# Patient Record
Sex: Female | Born: 1970 | Race: Black or African American | Hispanic: No | Marital: Single | Smoking: Never smoker
Health system: Southern US, Community
[De-identification: ages and names within clinical notes are randomized; demographics above are authoritative.]

## PROBLEM LIST (undated history)

## (undated) DIAGNOSIS — K219 Gastro-esophageal reflux disease without esophagitis: Secondary | ICD-10-CM

## (undated) DIAGNOSIS — R011 Cardiac murmur, unspecified: Secondary | ICD-10-CM

## (undated) DIAGNOSIS — D649 Anemia, unspecified: Secondary | ICD-10-CM

## (undated) DIAGNOSIS — B20 Human immunodeficiency virus [HIV] disease: Secondary | ICD-10-CM

---

## 1997-09-22 ENCOUNTER — Emergency Department (HOSPITAL_COMMUNITY): Admission: EM | Admit: 1997-09-22 | Discharge: 1997-09-22 | Payer: Self-pay | Admitting: Emergency Medicine

## 1998-06-27 ENCOUNTER — Emergency Department (HOSPITAL_COMMUNITY): Admission: EM | Admit: 1998-06-27 | Discharge: 1998-06-27 | Payer: Self-pay | Admitting: Emergency Medicine

## 1998-07-11 ENCOUNTER — Emergency Department (HOSPITAL_COMMUNITY): Admission: EM | Admit: 1998-07-11 | Discharge: 1998-07-11 | Payer: Self-pay

## 1999-05-16 ENCOUNTER — Emergency Department (HOSPITAL_COMMUNITY): Admission: EM | Admit: 1999-05-16 | Discharge: 1999-05-16 | Payer: Self-pay | Admitting: Emergency Medicine

## 2006-05-21 ENCOUNTER — Ambulatory Visit: Payer: Self-pay | Admitting: Internal Medicine

## 2006-05-21 ENCOUNTER — Encounter: Admission: RE | Admit: 2006-05-21 | Discharge: 2006-05-21 | Payer: Self-pay | Admitting: Internal Medicine

## 2006-05-21 LAB — CONVERTED CEMR LAB
BUN: 6 mg/dL (ref 6–23)
Basophils Absolute: 0.1 10*3/uL (ref 0.0–0.1)
Basophils Relative: 3 % — ABNORMAL HIGH (ref 0–1)
Bilirubin Urine: NEGATIVE
Calcium: 9.4 mg/dL (ref 8.4–10.5)
Chlamydia, Swab/Urine, PCR: NEGATIVE
Eosinophils Relative: 2 % (ref 0–5)
GC Probe Amp, Urine: NEGATIVE
HIV: REACTIVE
Hemoglobin, Urine: NEGATIVE
Hepatitis B Surface Ag: NEGATIVE
Ketones, ur: NEGATIVE mg/dL
Lymphocytes Relative: 39 % (ref 12–46)
Lymphs Abs: 1.8 10*3/uL (ref 0.7–3.3)
MCHC: 30.1 g/dL (ref 30.0–36.0)
MCV: 61.7 fL — ABNORMAL LOW (ref 78.0–100.0)
Monocytes Absolute: 0.6 10*3/uL (ref 0.2–0.7)
Monocytes Relative: 13 % — ABNORMAL HIGH (ref 3–11)
Neutro Abs: 2 10*3/uL (ref 1.7–7.7)
Potassium: 3.8 meq/L (ref 3.5–5.3)
Protein, ur: NEGATIVE mg/dL
RBC / HPF: NONE SEEN (ref <3)
RBC: 4.96 M/uL (ref 3.87–5.11)
Sodium: 136 meq/L (ref 135–145)
Specific Gravity, Urine: 1.013 (ref 1.005–1.03)
Total Bilirubin: 1.1 mg/dL (ref 0.3–1.2)
Urine Glucose: NEGATIVE mg/dL
WBC: 4.7 10*3/uL (ref 4.0–10.5)

## 2006-06-04 DIAGNOSIS — D509 Iron deficiency anemia, unspecified: Secondary | ICD-10-CM | POA: Insufficient documentation

## 2006-06-04 DIAGNOSIS — D573 Sickle-cell trait: Secondary | ICD-10-CM | POA: Insufficient documentation

## 2006-06-04 DIAGNOSIS — B20 Human immunodeficiency virus [HIV] disease: Secondary | ICD-10-CM | POA: Insufficient documentation

## 2006-06-05 ENCOUNTER — Ambulatory Visit: Payer: Self-pay | Admitting: Internal Medicine

## 2006-06-10 ENCOUNTER — Encounter: Payer: Self-pay | Admitting: Internal Medicine

## 2006-06-12 ENCOUNTER — Telehealth (INDEPENDENT_AMBULATORY_CARE_PROVIDER_SITE_OTHER): Payer: Self-pay | Admitting: *Deleted

## 2006-11-17 ENCOUNTER — Telehealth: Payer: Self-pay | Admitting: Internal Medicine

## 2006-11-18 ENCOUNTER — Encounter: Payer: Self-pay | Admitting: Internal Medicine

## 2007-03-19 ENCOUNTER — Emergency Department (HOSPITAL_COMMUNITY): Admission: EM | Admit: 2007-03-19 | Discharge: 2007-03-19 | Payer: Self-pay | Admitting: Emergency Medicine

## 2007-06-01 ENCOUNTER — Emergency Department (HOSPITAL_COMMUNITY): Admission: EM | Admit: 2007-06-01 | Discharge: 2007-06-02 | Payer: Self-pay | Admitting: Emergency Medicine

## 2007-07-25 ENCOUNTER — Encounter: Payer: Self-pay | Admitting: Internal Medicine

## 2007-08-03 ENCOUNTER — Encounter: Payer: Self-pay | Admitting: Internal Medicine

## 2007-08-06 ENCOUNTER — Encounter: Payer: Self-pay | Admitting: Internal Medicine

## 2007-08-06 ENCOUNTER — Ambulatory Visit: Payer: Self-pay | Admitting: Internal Medicine

## 2007-08-10 ENCOUNTER — Encounter: Payer: Self-pay | Admitting: Internal Medicine

## 2007-08-12 ENCOUNTER — Encounter: Payer: Self-pay | Admitting: Internal Medicine

## 2007-08-13 ENCOUNTER — Ambulatory Visit: Payer: Self-pay | Admitting: Internal Medicine

## 2007-08-13 LAB — CONVERTED CEMR LAB
Absolute CD4: 507 #/uL (ref 381–1469)
CD4 T Helper %: 27 % — ABNORMAL LOW (ref 32–62)
Total lymphocyte count: 1876 cells/mcL (ref 700–3300)

## 2007-08-14 ENCOUNTER — Telehealth (INDEPENDENT_AMBULATORY_CARE_PROVIDER_SITE_OTHER): Payer: Self-pay | Admitting: *Deleted

## 2007-08-17 ENCOUNTER — Encounter: Payer: Self-pay | Admitting: Infectious Diseases

## 2007-08-17 LAB — CONVERTED CEMR LAB
Basophils Absolute: 0.1 10*3/uL (ref 0.0–0.1)
Basophils Relative: 2 % — ABNORMAL HIGH (ref 0–1)
CO2: 23 meq/L (ref 19–32)
Calcium: 8.7 mg/dL (ref 8.4–10.5)
Chloride: 108 meq/L (ref 96–112)
Eosinophils Relative: 1 % (ref 0–5)
HCT: 25.2 % — ABNORMAL LOW (ref 36.0–46.0)
Hemoglobin: 7 g/dL — CL (ref 12.0–15.0)
Lymphocytes Relative: 28 % (ref 12–46)
Lymphs Abs: 1.9 10*3/uL (ref 0.7–4.0)
MCHC: 27.8 g/dL — ABNORMAL LOW (ref 30.0–36.0)
Monocytes Absolute: 0.8 10*3/uL (ref 0.1–1.0)
Monocytes Relative: 11 % (ref 3–12)
Neutro Abs: 3.9 10*3/uL (ref 1.7–7.7)
Neutrophils Relative %: 57 % (ref 43–77)
Platelets: 420 10*3/uL — ABNORMAL HIGH (ref 150–400)
Potassium: 4.2 meq/L (ref 3.5–5.3)
Total Bilirubin: 0.8 mg/dL (ref 0.3–1.2)
Total Protein: 7.8 g/dL (ref 6.0–8.3)

## 2007-11-02 ENCOUNTER — Ambulatory Visit: Payer: Self-pay | Admitting: Internal Medicine

## 2007-11-02 DIAGNOSIS — K130 Diseases of lips: Secondary | ICD-10-CM | POA: Insufficient documentation

## 2007-12-01 ENCOUNTER — Telehealth: Payer: Self-pay | Admitting: Internal Medicine

## 2007-12-15 ENCOUNTER — Telehealth: Payer: Self-pay | Admitting: Internal Medicine

## 2008-01-28 ENCOUNTER — Ambulatory Visit: Payer: Self-pay | Admitting: Internal Medicine

## 2008-08-02 ENCOUNTER — Encounter: Payer: Self-pay | Admitting: Internal Medicine

## 2008-08-22 ENCOUNTER — Encounter (INDEPENDENT_AMBULATORY_CARE_PROVIDER_SITE_OTHER): Payer: Self-pay | Admitting: Internal Medicine

## 2008-08-22 ENCOUNTER — Ambulatory Visit: Payer: Self-pay | Admitting: Internal Medicine

## 2008-08-22 LAB — CONVERTED CEMR LAB: HIV-1 RNA Quant, Log: 2.48 — ABNORMAL HIGH (ref <1.68)

## 2008-08-23 ENCOUNTER — Ambulatory Visit: Payer: Self-pay | Admitting: Internal Medicine

## 2008-08-23 ENCOUNTER — Telehealth (INDEPENDENT_AMBULATORY_CARE_PROVIDER_SITE_OTHER): Payer: Self-pay | Admitting: *Deleted

## 2008-08-23 LAB — CONVERTED CEMR LAB
ALT: 8 units/L (ref 0–35)
AST: 11 units/L (ref 0–37)
Albumin: 4.1 g/dL (ref 3.5–5.2)
Alkaline Phosphatase: 34 units/L — ABNORMAL LOW (ref 39–117)
BUN: 7 mg/dL (ref 6–23)
Basophils Absolute: 0.1 10*3/uL (ref 0.0–0.1)
Basophils Absolute: 0.2 10*3/uL — ABNORMAL HIGH (ref 0.0–0.1)
Basophils Relative: 3 % — ABNORMAL HIGH (ref 0–1)
Calcium: 9 mg/dL (ref 8.4–10.5)
Eosinophils Absolute: 0.1 10*3/uL (ref 0.0–0.7)
Eosinophils Absolute: 0.1 10*3/uL (ref 0.0–0.7)
Eosinophils Relative: 2 % (ref 0–5)
Eosinophils Relative: 1 % (ref 0–5)
GFR calc Af Amer: >60 mL/min (ref >60)
HCT: 19 % — ABNORMAL LOW (ref 36.0–46.0)
Hemoglobin: 5.6 g/dL — CL (ref 12.0–15.0)
Lymphocytes Relative: 32 % (ref 12–46)
Lymphs Abs: 2 10*3/uL (ref 0.7–4.0)
MCHC: 26.1 g/dL — ABNORMAL LOW (ref 30.0–36.0)
MCHC: 29.6 g/dL — ABNORMAL LOW (ref 30.0–36.0)
MCV: 53.9 fL — ABNORMAL LOW (ref 78.0–100.0)
MCV: 52.9 fL — ABNORMAL LOW (ref 78.0–100.0)
Monocytes Absolute: 0.7 10*3/uL (ref 0.1–1.0)
Monocytes Relative: 11 % (ref 3–12)
Neutro Abs: 2.8 10*3/uL (ref 1.7–7.7)
Neutro Abs: 3.3 10*3/uL (ref 1.7–7.7)
Neutrophils Relative %: 47 % (ref 43–77)
Neutrophils Relative %: 53 % (ref 43–77)
Platelets: 439 10*3/uL — ABNORMAL HIGH (ref 150–400)
Platelets: 337 10*3/uL (ref 150–400)
RBC: 3.69 M/uL — ABNORMAL LOW (ref 3.87–5.11)
RBC: 3.59 M/uL — ABNORMAL LOW (ref 3.87–5.11)
RDW: 21.9 % — ABNORMAL HIGH (ref 11.5–15.5)
RDW: 23.6 % — ABNORMAL HIGH (ref 11.5–15.5)
WBC: 6.2 10*3/uL (ref 4.0–10.5)

## 2008-08-24 ENCOUNTER — Ambulatory Visit (HOSPITAL_COMMUNITY): Admission: RE | Admit: 2008-08-24 | Discharge: 2008-08-24 | Payer: Self-pay | Admitting: Internal Medicine

## 2008-09-05 ENCOUNTER — Encounter: Payer: Self-pay | Admitting: Internal Medicine

## 2008-09-05 ENCOUNTER — Ambulatory Visit: Payer: Self-pay | Admitting: Internal Medicine

## 2008-09-05 DIAGNOSIS — R109 Unspecified abdominal pain: Secondary | ICD-10-CM | POA: Insufficient documentation

## 2008-09-05 LAB — CONVERTED CEMR LAB
Hemoglobin: 8.8 g/dL — ABNORMAL LOW (ref 12.0–15.0)
MCV: 64.5 fL — ABNORMAL LOW (ref 78.0–100.0)
Platelets: 783 10*3/uL — ABNORMAL HIGH (ref 150–400)
RBC: 4.73 M/uL (ref 3.87–5.11)
RDW: 32.1 % — ABNORMAL HIGH (ref 11.5–15.5)
WBC: 7.7 10*3/uL (ref 4.0–10.5)

## 2008-09-22 ENCOUNTER — Encounter: Payer: Self-pay | Admitting: Internal Medicine

## 2009-01-01 ENCOUNTER — Telehealth: Payer: Self-pay | Admitting: Internal Medicine

## 2009-06-30 ENCOUNTER — Emergency Department (HOSPITAL_COMMUNITY): Admission: EM | Admit: 2009-06-30 | Discharge: 2009-06-30 | Payer: Self-pay | Admitting: Emergency Medicine

## 2009-08-21 ENCOUNTER — Encounter: Payer: Self-pay | Admitting: Internal Medicine

## 2009-09-24 ENCOUNTER — Encounter: Payer: Self-pay | Admitting: Internal Medicine

## 2009-09-24 ENCOUNTER — Ambulatory Visit: Payer: Self-pay | Admitting: Internal Medicine

## 2009-09-24 LAB — CONVERTED CEMR LAB: HIV-1 RNA Quant, Log: 2.69 — ABNORMAL HIGH (ref <1.68)

## 2009-09-25 ENCOUNTER — Telehealth (INDEPENDENT_AMBULATORY_CARE_PROVIDER_SITE_OTHER): Payer: Self-pay | Admitting: *Deleted

## 2009-09-25 ENCOUNTER — Telehealth: Payer: Self-pay | Admitting: Internal Medicine

## 2009-09-25 LAB — CONVERTED CEMR LAB
ALT: 10 units/L (ref 0–35)
Alkaline Phosphatase: 34 units/L — ABNORMAL LOW (ref 39–117)
BUN: 5 mg/dL — ABNORMAL LOW (ref 6–23)
Basophils Absolute: 0.1 10*3/uL (ref 0.0–0.1)
Eosinophils Relative: 2 % (ref 0–5)
Hemoglobin: 5.3 g/dL — CL (ref 12.0–15.0)
Lymphs Abs: 1.6 10*3/uL (ref 0.7–4.0)
Neutrophils Relative %: 43 % (ref 43–77)
Potassium: 4.4 meq/L (ref 3.5–5.3)
RBC: 3.74 M/uL — ABNORMAL LOW (ref 3.87–5.11)
Sodium: 136 meq/L (ref 135–145)
Total Bilirubin: 0.8 mg/dL (ref 0.3–1.2)
WBC: 4.1 10*3/uL (ref 4.0–10.5)

## 2009-09-26 ENCOUNTER — Ambulatory Visit (HOSPITAL_COMMUNITY): Admission: RE | Admit: 2009-09-26 | Discharge: 2009-09-26 | Payer: Self-pay | Admitting: Internal Medicine

## 2009-10-08 ENCOUNTER — Ambulatory Visit: Payer: Self-pay | Admitting: Internal Medicine

## 2009-10-08 LAB — CONVERTED CEMR LAB
HCT: 33.5 % — ABNORMAL LOW (ref 36.0–46.0)
MCHC: 29.6 g/dL — ABNORMAL LOW (ref 30.0–36.0)
Platelets: 573 10*3/uL — ABNORMAL HIGH (ref 150–400)

## 2010-05-09 ENCOUNTER — Encounter: Payer: Self-pay | Admitting: *Deleted

## 2010-05-16 NOTE — Miscellaneous (Signed)
Summary: HIPAA Restrictions  HIPAA Restrictions   Imported By: Florinda Marker 09/24/2009 10:39:02  _____________________________________________________________________  External Attachment:    Type:   Image     Comment:   External Document

## 2010-05-16 NOTE — Letter (Signed)
Summary: Generic Letter  [facility]  [address]   [place], [place] [address]   Phone: [phone]  Fax: [phone]    05/09/2010  STEPHIE XU [address] [place], [place]  [address]  Dear Ms. Hild,   I have been unable to reach you by phone. Please call us when you get this letter & let us know your current number. We are trying to schedule your PAP smear. Call the front office at [phone]. Thanks.        Sincerely,   Golden Circle RN

## 2010-05-16 NOTE — Assessment & Plan Note (Signed)
Summary: CHECK UP [MKJ]   CC:  follow-up visit, lab results, and pt. c/o night sweats x 1 month and poor appetite.  History of Present Illness: Pt feeling well. No SOB or fatigue.  She does c/o heavy periods.  Pt c/o night sweats.  No fever, cough or swollen nodes.  Preventive Screening-Counseling & Management  Alcohol-Tobacco     Alcohol drinks/day: 0     Alcohol type: all     >5/day in last 3 mos: yes     Smoking Status: never  Caffeine-Diet-Exercise     Caffeine use/day: coffee, tea, sodas 6 per day     Does Patient Exercise: yes     Type of exercise: walking     Exercise (avg: min/session): 30-60     Times/week: 5  Safety-Violence-Falls     Seat Belt Use: yes      Sexual History:  n/a.        Drug Use:  never.    Comments: pt. declined condoms   Updated Prior Medication List: No Medications Current Allergies (reviewed today): No known allergies  Past History:  Past Medical History: Last updated: 06/04/2006 HIV disease Anemia-iron deficiency Sickle cell trait  Social History: Sexual History:  n/a Drug Use:  never  Review of Systems  The patient denies anorexia, fever, weight loss, syncope, dyspnea on exertion, melena, and hematochezia.    Vital Signs:  Patient profile:   40 year old female Menstrual status:  regular Height:      61 inches (154.94 cm) Weight:      90.4 pounds (41.09 kg) BMI:     17.14 Temp:     97.5 degrees F (36.39 degrees C) oral Pulse rate:   73 / minute BP sitting:   138 / 87  (right arm)  Vitals Entered By: Wendall Mola CMA Duncan Dull) (October 08, 2009 9:31 AM) CC: follow-up visit, lab results, pt. c/o night sweats x 1 month and poor appetite Is Patient Diabetic? No Pain Assessment Patient in pain? no      Nutritional Status BMI of < 19 = underweight Nutritional Status Detail appetite "not good"  Have you ever been in a relationship where you felt threatened, hurt or afraid?No   Does patient need  assistance? Functional Status Self care Ambulation Normal   Physical Exam  General:  alert, well-developed, well-nourished, and well-hydrated.   Head:  normocephalic and atraumatic.   Mouth:  pharynx pink and moist.   Neck:  no cervical lymphadenopathy.   Lungs:  normal breath sounds.   Heart:  normal rate and regular rhythm.      Impression & Recommendations:  Problem # 1:  HIV DISEASE (ICD-042) Pt currently asymptomatic and not on treatment. will repeat labs in 3 months.  Schedule for PAP. Diagnostics Reviewed:  HIV: REACTIVE (05/21/2006)   HIV-Western blot: POSITIVE (05/21/2006)   CD4: 590 (09/25/2009)   WBC: 4.1 (09/24/2009)   Hgb: 5.3 (09/24/2009)   HCT: 20.2 (09/24/2009)   Platelets: 364 (09/24/2009) HIV-1 RNA: 492 (09/24/2009)   HBSAg: NEG (05/21/2006)  Problem # 2:  ANEMIA-IRON DEFICIENCY (ICD-280.9) Pt to take iron. will repeat CBC s/p transfusion. Orders: T-CBC No Diff ([id number])  Other Orders: Est. Patient Level III ([address]) Future Orders: T-CD4SP ([facility]) (CD4SP) ... 01/06/2010 T-HIV Viral Load [phone]) ... 01/06/2010 T-Comprehensive Metabolic Panel [phone]) ... 01/06/2010 T-CBC w/Diff ([id number]) ... 01/06/2010  Patient Instructions: 1)  Please schedule a follow-up appointment in 3 months, 2 weeks after labs. 2)  Schedule for PAP in  PAP clinic

## 2010-05-16 NOTE — Progress Notes (Signed)
Summary: phone note  Phone Note Call from Patient Call back at [phone]   Caller: Patient Call For: Yisroel Ramming MD Summary of Call: Patient called wanted to know if an appointment was made for her to get blood transfusions? Call back number is [phone] Initial call taken by: Starleen Arms CMA,  September 25, 2009 11:27 AM  Follow-up for Phone Call        please transfuse with 3 units of PRBC will need a f/u CBC after transfusion Follow-up by: Yisroel Ramming MD,  September 25, 2009 2:45 PM  Additional Follow-up for Phone Call Additional follow up Details #1::        appt. made with short stay for transfusion for 09/26/09 at 8:00 AM, pt. aware Additional Follow-up by: Wendall Mola CMA Duncan Dull),  September 25, 2009 3:57 PM

## 2010-05-16 NOTE — Miscellaneous (Signed)
Summary: Orders Update - Bridge Counseling appt.  Clinical Lists Changes  Orders: Added new Referral order of Misc. Referral (Misc. Ref) - Signed

## 2010-05-16 NOTE — Progress Notes (Signed)
  Phone Note Other Incoming   Summary of Call: Received a call from [facility] at 12:30 AM 09/25/2009 for a critical lab value. Patients Hb is 5.3 Looking back at her previous Hb's, it seems like she has been running at this range and received transfusions last month. Called patient at [phone] and recommended to go the the ED immeditely. Patient reported that she has been feeling fine and doesn't have any symptoms. She wants to talk to Dr. Philipp Deputy and get arrangements at short stay just like the way she did last month. I informed her that the whole process may take half to one day and recommended to go to ED. Patient still wants to talk to Dr. Danella Deis office and get the transfusions done at short stay. Will pass on this information to Dr. Philipp Deputy. Initial call taken by: Blondell Reveal MD,  September 25, 2009 12:59 AM  Follow-up for Phone Call       Follow-up by: Blondell Reveal MD,  September 25, 2009 1:03 AM

## 2010-06-30 LAB — TYPE AND SCREEN: Antibody Screen: NEGATIVE

## 2010-07-01 LAB — T-HELPER CELL (CD4) - (RCID CLINIC ONLY): CD4 T Cell Abs: 590 uL (ref 400–2700)

## 2010-07-23 LAB — CROSSMATCH: Antibody Screen: NEGATIVE

## 2011-03-04 ENCOUNTER — Telehealth: Payer: Self-pay | Admitting: *Deleted

## 2011-03-04 ENCOUNTER — Encounter: Payer: Self-pay | Admitting: *Deleted

## 2011-03-04 NOTE — Telephone Encounter (Signed)
Her phone had been d/c. I sent her a letter asking her to cal;l. Needs pap, lab & md appt

## 2011-03-18 ENCOUNTER — Telehealth: Payer: Self-pay | Admitting: *Deleted

## 2011-03-18 NOTE — Telephone Encounter (Signed)
Letter I sent her was returned. She no longer has the same address. Given to bridge counsellor [employer]

## 2011-03-24 ENCOUNTER — Encounter: Payer: Self-pay | Admitting: *Deleted

## 2011-05-22 ENCOUNTER — Telehealth: Payer: Self-pay | Admitting: *Deleted

## 2011-05-22 NOTE — Telephone Encounter (Signed)
Pt out-of-care.  Please attempt to find.

## 2011-05-30 ENCOUNTER — Telehealth: Payer: Self-pay | Admitting: *Deleted

## 2011-05-30 NOTE — Telephone Encounter (Signed)
Transferred to [facility] per Sharol Roussel, Bridge Counselor.

## 2014-07-12 ENCOUNTER — Telehealth: Payer: Self-pay

## 2014-07-12 NOTE — Telephone Encounter (Signed)
Patient contacted regarding new intake appointment. Date and time given. Information given regarding documents needed to qualify for financial eligibility.  Tammy K King, RN  

## 2014-07-27 ENCOUNTER — Ambulatory Visit: Payer: Self-pay

## 2014-09-05 ENCOUNTER — Other Ambulatory Visit (HOSPITAL_COMMUNITY): Admission: RE | Admit: 2014-09-05 | Payer: Self-pay | Source: Ambulatory Visit | Admitting: Internal Medicine

## 2014-09-05 ENCOUNTER — Ambulatory Visit (INDEPENDENT_AMBULATORY_CARE_PROVIDER_SITE_OTHER): Payer: Self-pay

## 2014-09-05 ENCOUNTER — Telehealth: Payer: Self-pay | Admitting: *Deleted

## 2014-09-05 DIAGNOSIS — Z23 Encounter for immunization: Secondary | ICD-10-CM

## 2014-09-05 DIAGNOSIS — B20 Human immunodeficiency virus [HIV] disease: Secondary | ICD-10-CM

## 2014-09-05 DIAGNOSIS — Z8742 Personal history of other diseases of the female genital tract: Secondary | ICD-10-CM

## 2014-09-05 DIAGNOSIS — D500 Iron deficiency anemia secondary to blood loss (chronic): Secondary | ICD-10-CM

## 2014-09-05 DIAGNOSIS — D509 Iron deficiency anemia, unspecified: Secondary | ICD-10-CM | POA: Insufficient documentation

## 2014-09-05 LAB — URINALYSIS
Bilirubin Urine: NEGATIVE
Glucose, UA: NEGATIVE mg/dL
Hgb urine dipstick: NEGATIVE
KETONES UR: NEGATIVE mg/dL
NITRITE: NEGATIVE
PH: 7 (ref 5.0–8.0)
Protein, ur: NEGATIVE mg/dL
Specific Gravity, Urine: <1.005 — ABNORMAL LOW (ref 1.005–1.030)
Urobilinogen, UA: 1 mg/dL (ref 0.0–1.0)

## 2014-09-05 LAB — CBC WITH DIFFERENTIAL/PLATELET
BASOS ABS: 0.1 10*3/uL (ref 0.0–0.1)
BASOS PCT: 2 % — AB (ref 0–1)
EOS ABS: 0.2 10*3/uL (ref 0.0–0.7)
Eosinophils Relative: 4 % (ref 0–5)
HCT: 23.9 % — ABNORMAL LOW (ref 36.0–46.0)
Hemoglobin: 6.6 g/dL — CL (ref 12.0–15.0)
Lymphocytes Relative: 20 % (ref 12–46)
Lymphs Abs: 0.9 10*3/uL (ref 0.7–4.0)
MCH: 15.8 pg — ABNORMAL LOW (ref 26.0–34.0)
MCHC: 27.2 g/dL — AB (ref 30.0–36.0)
MCV: 57 fL — ABNORMAL LOW (ref 78.0–100.0)
MONOS PCT: 11 % (ref 3–12)
MPV: 9 fL (ref 8.6–12.4)
Monocytes Absolute: 0.5 10*3/uL (ref 0.1–1.0)
NEUTROS ABS: 2.7 10*3/uL (ref 1.7–7.7)
Neutrophils Relative %: 63 % (ref 43–77)
Platelets: 645 10*3/uL — ABNORMAL HIGH (ref 150–400)
RBC: 4.19 MIL/uL (ref 3.87–5.11)
RDW: 20.7 % — AB (ref 11.5–15.5)
WBC: 4.3 10*3/uL (ref 4.0–10.5)

## 2014-09-05 LAB — HEPATITIS B SURFACE ANTIGEN: Hepatitis B Surface Ag: NEGATIVE

## 2014-09-05 LAB — COMPLETE METABOLIC PANEL WITH GFR
ALBUMIN: 3.9 g/dL (ref 3.5–5.2)
ALT: 9 U/L (ref 0–35)
AST: 11 U/L (ref 0–37)
Alkaline Phosphatase: 57 U/L (ref 39–117)
BUN: 7 mg/dL (ref 6–23)
CALCIUM: 8.8 mg/dL (ref 8.4–10.5)
CO2: 23 meq/L (ref 19–32)
Chloride: 104 mEq/L (ref 96–112)
Creat: 0.46 mg/dL — ABNORMAL LOW (ref 0.50–1.10)
GFR, Est African American: >89 mL/min
GLUCOSE: 85 mg/dL (ref 70–99)
Potassium: 4.2 mEq/L (ref 3.5–5.3)
Sodium: 137 mEq/L (ref 135–145)
TOTAL PROTEIN: 7.6 g/dL (ref 6.0–8.3)
Total Bilirubin: 0.5 mg/dL (ref 0.2–1.2)

## 2014-09-05 LAB — LIPID PANEL
Cholesterol: 151 mg/dL (ref 0–200)
HDL: 58 mg/dL (ref >=46)
LDL CALC: 81 mg/dL (ref 0–99)
TRIGLYCERIDES: 60 mg/dL (ref <150)
Total CHOL/HDL Ratio: 2.6 Ratio
VLDL: 12 mg/dL (ref 0–40)

## 2014-09-05 LAB — HEPATITIS B SURFACE ANTIBODY,QUALITATIVE: HEP B S AB: NEGATIVE

## 2014-09-05 LAB — HEPATITIS C ANTIBODY: HCV AB: NEGATIVE

## 2014-09-05 LAB — HEPATITIS B CORE ANTIBODY, TOTAL: HEP B C TOTAL AB: NONREACTIVE

## 2014-09-05 LAB — RPR

## 2014-09-05 LAB — HEPATITIS A ANTIBODY, TOTAL: Hep A Total Ab: REACTIVE — AB

## 2014-09-05 NOTE — Telephone Encounter (Signed)
Pt needing appt for PAP smear.

## 2014-09-05 NOTE — Progress Notes (Signed)
Patient  is transferring from [facility] and was a prior patient with [employer] in  2008.  She has been without medications since October 2015.  She has problems swallowing large pills and says she would have no problems with a small pill regimen.  Her last regimen was Viread, [place].  She is a [facility]  client and has been referred to Adherence counselor.   No tattoos. Partial medical records received from [facility]. Pneumonia vaccine given.   Laverle Patter, RN

## 2014-09-06 ENCOUNTER — Emergency Department (HOSPITAL_COMMUNITY)
Admission: EM | Admit: 2014-09-06 | Discharge: 2014-09-06 | Disposition: A | Discharge status: Home / Self Care | Payer: Medicaid Other | Attending: Emergency Medicine | Admitting: Emergency Medicine

## 2014-09-06 ENCOUNTER — Telehealth: Payer: Self-pay

## 2014-09-06 ENCOUNTER — Encounter (HOSPITAL_COMMUNITY): Payer: Self-pay | Admitting: *Deleted

## 2014-09-06 DIAGNOSIS — Z21 Asymptomatic human immunodeficiency virus [HIV] infection status: Secondary | ICD-10-CM | POA: Diagnosis not present

## 2014-09-06 DIAGNOSIS — D500 Iron deficiency anemia secondary to blood loss (chronic): Secondary | ICD-10-CM | POA: Insufficient documentation

## 2014-09-06 DIAGNOSIS — Z79899 Other long term (current) drug therapy: Secondary | ICD-10-CM | POA: Diagnosis not present

## 2014-09-06 DIAGNOSIS — R7989 Other specified abnormal findings of blood chemistry: Secondary | ICD-10-CM | POA: Diagnosis present

## 2014-09-06 HISTORY — DX: Anemia, unspecified: D64.9

## 2014-09-06 HISTORY — DX: Human immunodeficiency virus (HIV) disease: B20

## 2014-09-06 LAB — IRON AND TIBC
Iron: 12 ug/dL — ABNORMAL LOW (ref 28–170)
SATURATION RATIOS: 3 % — AB (ref 10.4–31.8)
TIBC: 438 ug/dL (ref 250–450)
UIBC: 426 ug/dL

## 2014-09-06 LAB — COMPREHENSIVE METABOLIC PANEL
ALT: 10 U/L — AB (ref 14–54)
AST: 14 U/L — ABNORMAL LOW (ref 15–41)
Albumin: 3.4 g/dL — ABNORMAL LOW (ref 3.5–5.0)
Alkaline Phosphatase: 57 U/L (ref 38–126)
Anion gap: 8 (ref 5–15)
BILIRUBIN TOTAL: 0.6 mg/dL (ref 0.3–1.2)
CALCIUM: 8.6 mg/dL — AB (ref 8.9–10.3)
CO2: 24 mmol/L (ref 22–32)
Chloride: 105 mmol/L (ref 101–111)
Creatinine, Ser: 0.5 mg/dL (ref 0.44–1.00)
Glucose, Bld: 83 mg/dL (ref 65–99)
POTASSIUM: 3.9 mmol/L (ref 3.5–5.1)
SODIUM: 137 mmol/L (ref 135–145)
Total Protein: 7.5 g/dL (ref 6.5–8.1)

## 2014-09-06 LAB — RETICULOCYTES
RBC.: 3.85 MIL/uL — ABNORMAL LOW (ref 3.87–5.11)
RETIC CT PCT: 0.9 % (ref 0.4–3.1)
Retic Count, Absolute: 34.7 10*3/uL (ref 19.0–186.0)

## 2014-09-06 LAB — CBC WITH DIFFERENTIAL/PLATELET
BASOS PCT: 2 % — AB (ref 0–1)
Basophils Absolute: 0.1 10*3/uL (ref 0.0–0.1)
EOS ABS: 0.2 10*3/uL (ref 0.0–0.7)
EOS PCT: 3 % (ref 0–5)
HEMATOCRIT: 22.5 % — AB (ref 36.0–46.0)
Hemoglobin: 6.4 g/dL — CL (ref 12.0–15.0)
LYMPHS ABS: 1.1 10*3/uL (ref 0.7–4.0)
Lymphocytes Relative: 18 % (ref 12–46)
MCH: 16.1 pg — ABNORMAL LOW (ref 26.0–34.0)
MCHC: 28.4 g/dL — ABNORMAL LOW (ref 30.0–36.0)
MCV: 56.5 fL — AB (ref 78.0–100.0)
MONOS PCT: 14 % — AB (ref 3–12)
Monocytes Absolute: 0.8 10*3/uL (ref 0.1–1.0)
NEUTROS PCT: 63 % (ref 43–77)
Neutro Abs: 3.7 10*3/uL (ref 1.7–7.7)
PLATELETS: 547 10*3/uL — AB (ref 150–400)
RBC: 3.98 MIL/uL (ref 3.87–5.11)
RDW: 19.5 % — AB (ref 11.5–15.5)
WBC: 5.9 10*3/uL (ref 4.0–10.5)

## 2014-09-06 LAB — T-HELPER CELL (CD4) - (RCID CLINIC ONLY)
CD4 % Helper T Cell: 3 % — ABNORMAL LOW (ref 33–55)
CD4 T Cell Abs: 30 /uL — ABNORMAL LOW (ref 400–2700)

## 2014-09-06 LAB — VITAMIN B12: VITAMIN B 12: 272 pg/mL (ref 180–914)

## 2014-09-06 LAB — FOLATE: FOLATE: 15.2 ng/mL (ref >5.9)

## 2014-09-06 LAB — URINE CYTOLOGY ANCILLARY ONLY
CHLAMYDIA, DNA PROBE: NEGATIVE
Neisseria Gonorrhea: NEGATIVE

## 2014-09-06 LAB — FERRITIN: Ferritin: 2 ng/mL — ABNORMAL LOW (ref 11–307)

## 2014-09-06 LAB — PREPARE RBC (CROSSMATCH)

## 2014-09-06 MED ORDER — SODIUM CHLORIDE 0.9 % IV BOLUS (SEPSIS)
1000.0000 mL | Freq: Once | INTRAVENOUS | Status: AC
Start: 2014-09-06 — End: 2014-09-06
  Administered 2014-09-06: 1000 mL via INTRAVENOUS

## 2014-09-06 NOTE — ED Notes (Signed)
Patient denies any symptoms or reaction symptoms.

## 2014-09-06 NOTE — ED Provider Notes (Signed)
CSN: [id number]     Arrival date & time 09/06/14  1146 History   First MD Initiated Contact with Patient 09/06/14 1258     Chief Complaint  Patient presents with  . Abnormal Lab     (Consider location/radiation/quality/duration/timing/severity/associated sxs/prior Treatment) HPI Comments: Patient is a 44 year old with a past medical history of HIV and iron deficiency anemia who presents with low hemoglobin. Patient reports going to have labs done at [place] yesterday to establish care and they called her today and told her to come to the ED due to low hemoglobin. She was told her hemoglobin was 6.6. Patient denies any chest pain or SOB. Her usual hemoglobin is around 9 or 10. Patient's last CD4 count checked in October 2015 but she cannot recall the number. She says it was lower than 200. She has not taken her anti-retrovirals since then either because she has not had a doctor to prescribe them to her. She reports not taking her iron in a few weeks because she cannot afford the medicine. No associated symptoms. No heavy menstrual cycles, dark stool or blood in stool.    Past Medical History  Diagnosis Date  . Anemia   . HIV disease    Past Surgical History  Procedure Laterality Date  . Cesarean section     Family History  Problem Relation Age of Onset  . Hyperlipidemia Mother   . Cancer Father    History  Substance Use Topics  . Smoking status: Never Smoker   . Smokeless tobacco: Never Used  . Alcohol Use: 0.6 oz/week    1 Cans of beer per week   OB History    Gravida Para Term Preterm AB TAB SAB Ectopic Multiple Living   4 4 4             Review of Systems  Constitutional: Negative for fever, chills and fatigue.  HENT: Negative for trouble swallowing.   Eyes: Negative for visual disturbance.  Respiratory: Negative for shortness of breath.   Cardiovascular: Negative for chest pain and palpitations.  Gastrointestinal: Negative for nausea,  vomiting, abdominal pain and diarrhea.  Genitourinary: Negative for dysuria and difficulty urinating.  Musculoskeletal: Negative for arthralgias and neck pain.  Skin: Negative for color change.  Neurological: Negative for dizziness and weakness.  Psychiatric/Behavioral: Negative for dysphoric mood.      Allergies  Review of patient's allergies indicates no known allergies.  Home Medications   Prior to Admission medications   Medication Sig Start Date End Date Taking? Authorizing Provider  emtricitabine (EMTRIVA) 200 MG capsule Take 200 mg by mouth daily.    Historical Provider, MD  ferrous sulfate 325 (65 FE) MG tablet Take 325 mg by mouth daily with breakfast.    Historical Provider, MD  rilpivirine (EDURANT) 25 MG TABS tablet Take 25 mg by mouth daily with breakfast.    Historical Provider, MD  tenofovir (VIREAD) 300 MG tablet Take 300 mg by mouth daily.    Historical Provider, MD   BP 135/59 mmHg  Pulse 97  Temp(Src) 98.1 F (36.7 C) (Oral)  Resp 18  SpO2 99%  LMP 08/27/2014 (Approximate) Physical Exam  Constitutional: She is oriented to person, place, and time. She appears well-developed and well-nourished. No distress.  HENT:  Head: Normocephalic and atraumatic.  Eyes: Conjunctivae and EOM are normal.  Neck: Normal range of motion.  Cardiovascular: Normal rate and regular rhythm.  Exam reveals no gallop and no friction rub.  No murmur heard. Pulmonary/Chest: Effort normal and breath sounds normal. She has no wheezes. She has no rales. She exhibits no tenderness.  Abdominal: Soft. She exhibits no distension. There is no tenderness. There is no rebound.  Musculoskeletal: Normal range of motion.  Neurological: She is alert and oriented to person, place, and time. Coordination normal.  Speech is goal-oriented. Moves limbs without ataxia.   Skin: Skin is warm and dry.  Psychiatric: She has a normal mood and affect. Her behavior is normal.  Nursing note and vitals  reviewed.   ED Course  Procedures (including critical care time)  CRITICAL CARE Performed by: Alvina Chou   Total critical care time: 35 min  Critical care time was exclusive of separately billable procedures and treating other patients.  Critical care was necessary to treat or prevent imminent or life-threatening deterioration.  Critical care was time spent personally by me on the following activities: development of treatment plan with patient and/or surrogate as well as nursing, discussions with consultants, evaluation of patient's response to treatment, examination of patient, obtaining history from patient or surrogate, ordering and performing treatments and interventions, ordering and review of laboratory studies, ordering and review of radiographic studies, pulse oximetry and re-evaluation of patient's condition.   Labs Review Labs Reviewed  CBC WITH DIFFERENTIAL/PLATELET - Abnormal; Notable for the following:    Hemoglobin 6.4 (*)    HCT 22.5 (*)    MCV 56.5 (*)    MCH 16.1 (*)    MCHC 28.4 (*)    RDW 19.5 (*)    Platelets 547 (*)    Monocytes Relative 14 (*)    Basophils Relative 2 (*)    All other components within normal limits  COMPREHENSIVE METABOLIC PANEL - Abnormal; Notable for the following:    BUN <5 (*)    Calcium 8.6 (*)    Albumin 3.4 (*)    AST 14 (*)    ALT 10 (*)    All other components within normal limits  TYPE AND SCREEN  PREPARE RBC (CROSSMATCH)    Imaging Review No results found.   EKG Interpretation None      MDM   Final diagnoses:  Iron deficiency anemia due to chronic blood loss    1:02 PM Patient's hemoglobin low at 6.4. Patient will have 2 units of blood. I searched the patient in Care Everywhere to retrieve previous results but patient was unable to be found.   Patient transfused with 1 unit of blood per Internal Medicine service and then discharged. Patient instructed to take iron supplements and follow up with PCP.  Patient instructed to return with worsening or concerning symptoms.   Alvina Chou, PA-C 09/07/14 1150  Lacretia Leigh, MD 09/09/14 1616

## 2014-09-06 NOTE — ED Notes (Signed)
Phlebotomy called to draw blood.

## 2014-09-06 NOTE — ED Notes (Signed)
Consent signed. At the bedside.

## 2014-09-06 NOTE — Discharge Instructions (Signed)
Take iron as directed. Follow up with [facility]. Refer to attached documents for more information.

## 2014-09-06 NOTE — Telephone Encounter (Signed)
Dr Linus Salmons I wanted to verify critical lab of hemoglobin 6.6 was called to you on yesterday by the lab.  Patient has history of anemia and has been transfused several times at [facility].  Was the patient contacted? If not what would you like me to do?   Laverle Patter, RN

## 2014-09-06 NOTE — Telephone Encounter (Signed)
I spoke with Dr Linus Salmons and was given verbal orders: patient is to go to ED for assessment and possible transfusion for hemoglobin 6.6 .  Patient informed and is going now.  Temporary phone : [phone]   Laverle Patter, RN

## 2014-09-06 NOTE — ED Notes (Addendum)
Admitting MD at the bedside. Stated to hold off on blood at this time. Made aware I only had a thirty minute window.

## 2014-09-06 NOTE — ED Notes (Signed)
Pt reports going for blood work yesterday and sent here due to low hgb. Hx of anemia and blood transfusions.

## 2014-09-06 NOTE — ED Notes (Signed)
PA at the bedside.

## 2014-09-06 NOTE — ED Notes (Signed)
Blood was drawn before blood was started.

## 2014-09-06 NOTE — Consult Note (Signed)
Date: 09/06/2014               Patient Name:  Shirley White MRN: [id number]  DOB: 20-Jun-1970 Age / Sex: 44 y.o., female   PCP: No primary care provider on file.         Medical Service: Internal Medicine Teaching Service         Attending Physician: Dr. Beryle Beams     First Contact: Dr. Arcelia Jew Pager: [phone]  Second Contact: Dr. Heber   Pager: [phone]       After Hours (After 5p/  First Contact Pager: [phone]  weekends / holidays): Second Contact Pager: [phone]   Chief Complaint: Low hemoglobin   History of Present Illness: Shirley White is a 44yo woman with PMHx of iron deficiency anemia with multiple transfusions and HIV (not on HAART currently) who presented to the ED after she was called about her blood work results. Patient had blood work done at Dr. Henreitta Leber office yesterday and was notified that her hemoglobin was low at 6.6 and that she needed to go to the ED. Patient is completely asymptomatic. She denies any dizziness, fatigue, weakness, shortness of breath, chest pain, palpitations, abdominal pain, nausea, vomiting, melena, or hematochezia. She reports having multiple transfusions in the past at [facility]. She states she is supposed to take iron supplementation daily but that she isn't because she cannot afford it. She notes heavy menstrual periods every 21 days that last about 5 days. Her last menstrual period ended 2 weeks ago. She reports she typically uses 3-4 pads per day for her menstrual periods. She does not follow with an OBGYN.   In the ED, she was noted to have a Hbg of 6.4, MCV 56, and platelets 547.   Meds: Current Facility-Administered Medications  Medication Dose Route Frequency Provider Last Rate Last Dose  . sodium chloride 0.9 % bolus 1,000 mL  1,000 mL Intravenous Once Shirley Controls, PA-C 1,000 mL/hr at 09/06/14 1325 1,000 mL at 09/06/14 1325   Current Outpatient Prescriptions  Medication Sig Dispense Refill  . emtricitabine (EMTRIVA) 200 MG  capsule Take 200 mg by mouth daily.    . ferrous sulfate 325 (65 FE) MG tablet Take 325 mg by mouth daily with breakfast.    . rilpivirine (EDURANT) 25 MG TABS tablet Take 25 mg by mouth daily with breakfast.    . tenofovir (VIREAD) 300 MG tablet Take 300 mg by mouth daily.      Allergies: Allergies as of 09/06/2014  . (No Known Allergies)   Past Medical History  Diagnosis Date  . Anemia   . HIV disease    Past Surgical History  Procedure Laterality Date  . Cesarean section     Family History  Problem Relation Age of Onset  . Hyperlipidemia Mother   . Cancer Father    History   Social History  . Marital Status: Single    Spouse Name: N/A  . Number of Children: N/A  . Years of Education: N/A   Occupational History  . Not on file.   Social History Main Topics  . Smoking status: Never Smoker   . Smokeless tobacco: Never Used  . Alcohol Use: 0.6 oz/week    1 Cans of beer per week  . Drug Use: 7.00 per week    Special: Marijuana  . Sexual Activity:    Partners: Male    Birth Control/ Protection: Condom   Other Topics Concern  . Not on file   Social History  Narrative    Review of Systems: General: Denies fever, chills, night sweats, changes in weight, changes in appetite HEENT: Denies headaches, ear pain, changes in vision, rhinorrhea, sore throat CV: Denies orthopnea Pulm: Denies cough, wheezing GI: See above  GU: Denies dysuria, hematuria, frequency Msk: Denies muscle cramps, joint pains Neuro: Denies weakness, numbness, tingling Skin: Denies rashes, bruising  Physical Exam: Blood pressure 126/50, pulse 68, temperature 98.1 F (36.7 C), temperature source Oral, resp. rate 16, last menstrual period 08/27/2014, SpO2 100 %. General: young woman sitting up in bed, pleasant, NAD HEENT: [place]/AT, EOMI, pale conjunctiva, mucus membranes moist CV: RRR, 2/6 systolic murmur  Pulm: CTA bilaterally, breaths non-labored Abd: BS+, soft, non-tender, non-distended    Ext: warm, no edema, moves all Neuro: alert and oriented x 3, no focal deficits  Lab results: Basic Metabolic Panel:  Recent Labs  09/05/14 1024 09/06/14 1207  NA 137 137  K 4.2 3.9  CL 104 105  CO2 23 24  GLUCOSE 85 83  BUN 7 <5*  CREATININE 0.46* 0.50  CALCIUM 8.8 8.6*   Liver Function Tests:  Recent Labs  09/05/14 1024 09/06/14 1207  AST 11 14*  ALT 9 10*  ALKPHOS 57 57  BILITOT 0.5 0.6  PROT 7.6 7.5  ALBUMIN 3.9 3.4*   CBC:  Recent Labs  09/05/14 1024 09/06/14 1207  WBC 4.3 5.9  NEUTROABS 2.7 3.7  HGB 6.6* 6.4*  HCT 23.9* 22.5*  MCV 57.0* 56.5*  PLT 645* 547*   Fasting Lipid Panel:  Recent Labs  09/05/14 1024  CHOL 151  HDL 58  LDLCALC 81  TRIG 60  CHOLHDL 2.6   Urinalysis:  Recent Labs  09/05/14 1027  COLORURINE YELLOW  LABSPEC <1.005*  PHURINE 7.0  GLUCOSEU NEG  HGBUR NEG  BILIRUBINUR NEG  KETONESUR NEG  PROTEINUR NEG  UROBILINOGEN 1  NITRITE NEG  LEUKOCYTESUR TRACE*    Assessment & Plan by Problem:  Iron Deficiency Anemia: Patient with hx of multiple transfusions incidentally found to have a hemoglobin of 6.4 on routine blood work. She is completely asymptomatic. She is supposed to be taking iron supplementation daily, but has not been doing so. Her heavy menstrual cycles likely contribute to her anemia.  - Get anemia panel - Transfuse 1 unit - Give Feraheme x 1 - Instructed to get iron supplementation at [facility] outpatient pharmacy- can get 100 pills for $2 - Discharge home - Can follow up with [facility] and with Dr. Linus White  - Would benefit from referral to OBGYN through [facility]  HIV: Currently not on HAART therapy as [place]" per patient. She is followed by Dr. Linus White. Last HIV labs from 2011. Labs from yesterday pending. - f/u with Dr. Linus White   Dispo: Discharge today. Follow up with Dr. Linus White and [facility]   The patient does not have a current PCP (No primary  care provider on file.) and does need an [facility] hospital follow-up appointment after discharge.  The patient does not have transportation limitations that hinder transportation to clinic appointments.  Signed: Juliet Rude, MD 09/06/2014, 2:00 PM

## 2014-09-07 LAB — QUANTIFERON TB GOLD ASSAY (BLOOD)
Interferon Gamma Release Assay: NEGATIVE
MITOGEN VALUE: 2.09 [IU]/mL
QUANTIFERON NIL VALUE: 0.06 [IU]/mL
QUANTIFERON TB AG MINUS NIL: 0 [IU]/mL
TB AG VALUE: 0.06 [IU]/mL

## 2014-09-07 LAB — HIV-1 RNA ULTRAQUANT REFLEX TO GENTYP+
HIV 1 RNA Quant: 10131 copies/mL — ABNORMAL HIGH (ref <20)
HIV-1 RNA QUANT, LOG: 4.01 {Log} — AB (ref <1.30)

## 2014-09-08 LAB — TYPE AND SCREEN
ABO/RH(D): B POS
Antibody Screen: NEGATIVE
Unit division: 0
Unit division: 0

## 2014-09-09 LAB — HLA B*5701: HLA-B 5701 W/RFLX HLA-B HIGH: NEGATIVE

## 2014-09-15 LAB — HIV-1 GENOTYPR PLUS

## 2014-09-21 ENCOUNTER — Ambulatory Visit: Payer: Self-pay | Admitting: Internal Medicine

## 2014-09-26 ENCOUNTER — Encounter: Payer: Self-pay | Admitting: Internal Medicine

## 2014-09-26 ENCOUNTER — Ambulatory Visit (INDEPENDENT_AMBULATORY_CARE_PROVIDER_SITE_OTHER): Payer: Medicaid Other | Admitting: Internal Medicine

## 2014-09-26 VITALS — BP 116/77 | HR 84 | Temp 98.5°F | Ht 61.0 in | Wt 95.0 lb

## 2014-09-26 DIAGNOSIS — Z8742 Personal history of other diseases of the female genital tract: Secondary | ICD-10-CM

## 2014-09-26 DIAGNOSIS — B20 Human immunodeficiency virus [HIV] disease: Secondary | ICD-10-CM | POA: Diagnosis not present

## 2014-09-26 DIAGNOSIS — D500 Iron deficiency anemia secondary to blood loss (chronic): Secondary | ICD-10-CM

## 2014-09-26 MED ORDER — EMTRICITAB-RILPIVIR-TENOFOV AF 200-25-25 MG PO TABS: 1.0000 | ORAL_TABLET | Freq: Every day | ORAL | Status: DC

## 2014-09-26 MED ORDER — POLYSACCHARIDE IRON COMPLEX 150 MG PO CAPS: 150.0000 mg | ORAL_CAPSULE | Freq: Every day | ORAL | Status: DC

## 2014-09-26 MED ORDER — SULFAMETHOXAZOLE-TRIMETHOPRIM 200-40 MG/5ML PO SUSP: 160.0000 mg | Freq: Every day | ORAL | Status: DC

## 2014-09-26 MED ORDER — FLUCONAZOLE 100 MG PO TABS: 100.0000 mg | ORAL_TABLET | Freq: Every day | ORAL | Status: DC

## 2014-09-26 MED ORDER — AZITHROMYCIN 200 MG/5ML PO SUSR: 1200.0000 mg | ORAL | Status: DC

## 2014-09-26 NOTE — Progress Notes (Signed)
Subjective:    Patient ID: [facility], female    DOB: 06/30/1970, 44 y.o.   MRN: [id number]  HPI Comments: [facility] is a 44 year old woman with PMH of HIV dx in 2005 here to re-establish care.  She was previously followed in [place] up to 2011.  Her viral loads were relatively low and CD4 normal and HAART was not initiated.  She relocated to [place] in 2011 and was started on Complera.  She had difficulty swallowing the pill so she was switched to its components of Emtriva, Edurant and Viread.  She reports compliance with HAART up until October 2015 when she moved back to [place] and says she could not obtain medications because [facility] would not refill them.  She says she has been feeling well.  She would like to resume treatment but would prefer one pill per day.  Her other chronic medical issue is Fe-deficiency anemia.  She has been on Fe supplement since 2005 but is intermittently compliant due to ADR of constipation.  She also finds it difficult to obtain the drug since medicaid stopped covering it.  She recently required blood transfusion.    Past Medical History  Diagnosis Date  . Anemia   . HIV disease    No current outpatient prescriptions on file prior to visit.   No current facility-administered medications on file prior to visit.    Review of Systems  Constitutional: Negative for fever, chills, appetite change and unexpected weight change.  HENT: Negative for hearing loss and trouble swallowing.   Eyes: Negative for visual disturbance.  Respiratory: Positive for cough. Negative for shortness of breath.        Dry cough mostly in the morning after waking.  Cardiovascular: Negative for chest pain, palpitations and leg swelling.  Gastrointestinal: Negative for nausea, vomiting, abdominal pain, diarrhea, constipation and blood in stool.  Genitourinary: Negative for dysuria, hematuria and difficulty urinating.  Neurological: Negative for syncope, light-headedness and headaches.    Psychiatric/Behavioral: Negative for dysphoric mood.       Filed Vitals:   09/26/14 1445  BP: 116/77  Pulse: 84  Temp: 98.5 F (36.9 C)  TempSrc: Oral  Height: '5\' 1"'  (1.549 m)  Weight: 95 lb (43.092 kg)   Objective:   Physical Exam  Constitutional: She is oriented to person, place, and time. She appears well-developed. No distress.  HENT:  Head: Normocephalic and atraumatic.  Mouth/Throat: Oropharynx is clear and moist. No oropharyngeal exudate.  Poor dentition.  Eyes: EOM are normal. Pupils are equal, round, and reactive to light.  Neck: Neck supple.  Cardiovascular: Normal rate and regular rhythm.  Exam reveals no gallop and no friction rub.   Murmur heard. Pulmonary/Chest: Effort normal and breath sounds normal. No respiratory distress. She has no wheezes. She has no rales.  Abdominal: Soft. Bowel sounds are normal. She exhibits no distension. There is no tenderness. There is no rebound and no guarding.  Musculoskeletal: Normal range of motion. She exhibits no edema or tenderness.  Neurological: She is alert and oriented to person, place, and time. No cranial nerve deficit.  Skin: Skin is warm. She is not diaphoretic.  Psychiatric: She has a normal mood and affect. Her behavior is normal. Judgment and thought content normal.  Vitals reviewed.         Assessment & Plan:  HIV/AIDS:  Previously controlled without treatment.  After she moved to [place] it she was started on HAART.  Initially Complera and then switched to  its components due to difficulty swallowing the pill.  She denies complaint today.  She would like to resume HAART with a combo pill.  VL 10,131 and CD 30.  Will start [place] (smaller version of Complera) and OI prophylaxis - liquid azithromycin, liquid Bactrim and fluconazole.  The pharmacist also met with the patient to discuss medication changes.  Follow-up in 6 weeks.  Fe deficiency anemia:  Likely due to heavy menses.  She will need to follow-up with  [facility].  Will change her Fe formulation to Fe polysaccharide for less ADR in hopes this will help with tolerability and compliance.    Addendum: I have seen and examined at [facility] with Dr. Duwaine White who composes note. [facility] used to be followed here up until 2011 by one of my former partners, Dr. Aldona White. Her CD4 counts were normal then and her viral load was relatively low so she was simply observed off of therapy. After moving to [facility] she had her care at [place] [facility] where she was started on Complera. Because of difficulty swallowing large pills she was switched to its individual components which she had no difficulty swallowing. She tolerated the regimen well and denied missing doses. She recalls being told that if she continued her therapy she will probably become undetectable but does not remember any specific lab results. She denies ever being told that her medication was not working. She has been able to get medication since last October and as a result of her CD4 count has plummeted to 30 as her viral load has risen. Fortunately she has not developed any complications. She is eager to restart therapy. We reviewed options with her today and she also discuss this with our infectious disease pharmacist. Fortunately, the new progression called [place] [facility], contains the same 3 medications he has been on previously but in a much smaller pill. She will start that along with opportunistic infection prophylaxis with liquid trimethoprim-sulfamethoxazole, liquid azithromycin and fluconazole. We will also try to help her obtain a more affordable and tolerable forms of supplemental iron.  She believes she was infected many years ago through heterosexual sex with a positive partner. She has no history of injecting drug use. She does smoke marijuana daily and is trying to quit. She has 4 daughters all of whom are HIV negative and aware of her status. She is in a long  time, monogamous relationship with a female partner. He is aware of her status and is HIV negative. She feels like she has good support. She regularly visits [place] [facility]. She currently works at [employer].  Plan: Start [place] [facility] 1 daily Start trimethoprim sulfamethoxazole, azithromycin and fluconazole Restart iron supplements Followup after lab work in 6 weeks  Michel Bickers, MD [facility] [place] [phone] pager   [phone] cell 09/27/2014, 9:31 AM

## 2014-09-28 NOTE — Progress Notes (Signed)
Patient ID: [facility], female   DOB: February 27, 1971, 44 y.o.   MRN: [id number]    [facility] for Infectious Disease - Pharmacist    HPI: [facility] is a 44 y.o. female presented to the clinic to re-establish care. Patient was previously seen here in 2011 but moved to [facility] She is now back in [place]. She had previously been on Complera but could not swallow it and was switched to the single tablets of the individual components. Patient has been out of medications since Oct 2015.  Allergies: No Known Allergies  Vitals:    Past Medical History: Past Medical History  Diagnosis Date  . Anemia   . HIV disease     Social History: History   Social History  . Marital Status: Single    Spouse Name: N/A  . Number of Children: N/A  . Years of Education: N/A   Social History Main Topics  . Smoking status: Never Smoker   . Smokeless tobacco: Never Used  . Alcohol Use: 0.6 oz/week    1 Cans of beer per week     Comment: occa  . Drug Use: 7.00 per week    Special: Marijuana  . Sexual Activity:    Partners: Male    Birth Control/ Protection: Condom     Comment: given condoms   Other Topics Concern  . Not on file   Social History Narrative    Previous Regimen: Barnabas Harries, Edurant, Viread  Current Regimen: Off treatment  Labs: HIV 1 RNA QUANT (copies/mL)  Date Value  09/05/2014 10131*  09/24/2009 492*  08/22/2008 299*   CD4 T CELL ABS  Date Value  09/05/2014 30 /uL*  09/24/2009 590 cmm  08/22/2008 460 cmm   HEP B S AB (no units)  Date Value  09/05/2014 NEG   HEPATITIS B SURFACE AG (no units)  Date Value  09/05/2014 NEGATIVE   HCV AB (no units)  Date Value  09/05/2014 NEGATIVE    CrCl: CrCl cannot be calculated (Patient has no serum creatinine result on file.).  Lipids:    Component Value Date/Time   CHOL 151 09/05/2014 1024   TRIG 60 09/05/2014 1024   HDL 58 09/05/2014 1024   CHOLHDL 2.6 09/05/2014 1024   VLDL  12 09/05/2014 1024   LDLCALC 81 09/05/2014 1024    Assessment: Patient has been off treatment since Oct 2015 which is why she is not longer virally suppressed. Patient expresses that she cannot take large tablet due to the inability to swallow them.  Patient was shown Odesfey tablet next to Complera and she states that she can swallow that and it should not be an issue. Patient was counseled on the importance of taking it once daily and to never miss any doses. Patient verbalized understanding.  Recommendations: Begin Odefsey Stress compliance F/u per Dr. Glenice Bow, [place], Pharm.D. Clinical Infectious Disease [facility] 09/28/2014, 11:29 AM

## 2014-10-20 ENCOUNTER — Ambulatory Visit: Payer: Medicaid Other

## 2014-11-07 ENCOUNTER — Other Ambulatory Visit: Payer: Medicaid Other

## 2014-11-14 ENCOUNTER — Other Ambulatory Visit: Payer: Medicaid Other

## 2014-11-14 ENCOUNTER — Other Ambulatory Visit: Payer: Self-pay | Admitting: Internal Medicine

## 2014-11-14 DIAGNOSIS — B20 Human immunodeficiency virus [HIV] disease: Secondary | ICD-10-CM

## 2014-11-14 LAB — COMPREHENSIVE METABOLIC PANEL
ALK PHOS: 60 U/L (ref 33–115)
ALT: 6 U/L (ref 6–29)
AST: 10 U/L (ref 10–30)
Albumin: 3.2 g/dL — ABNORMAL LOW (ref 3.6–5.1)
BILIRUBIN TOTAL: 0.5 mg/dL (ref 0.2–1.2)
BUN: 5 mg/dL — ABNORMAL LOW (ref 7–25)
CO2: 27 mmol/L (ref 20–31)
Calcium: 8.8 mg/dL (ref 8.6–10.2)
Chloride: 103 mmol/L (ref 98–110)
Creat: 0.49 mg/dL — ABNORMAL LOW (ref 0.50–1.10)
Glucose, Bld: 92 mg/dL (ref 65–99)
POTASSIUM: 3.8 mmol/L (ref 3.5–5.3)
SODIUM: 135 mmol/L (ref 135–146)
TOTAL PROTEIN: 7.5 g/dL (ref 6.1–8.1)

## 2014-11-15 LAB — CBC WITH DIFFERENTIAL/PLATELET
BASOS PCT: 1 % (ref 0–1)
Basophils Absolute: 0.1 10*3/uL (ref 0.0–0.1)
EOS ABS: 0.2 10*3/uL (ref 0.0–0.7)
EOS PCT: 2 % (ref 0–5)
HCT: 24.4 % — ABNORMAL LOW (ref 36.0–46.0)
HEMOGLOBIN: 7 g/dL — AB (ref 12.0–15.0)
Lymphocytes Relative: 19 % (ref 12–46)
Lymphs Abs: 1.4 10*3/uL (ref 0.7–4.0)
MCH: 17.2 pg — ABNORMAL LOW (ref 26.0–34.0)
MCHC: 28.7 g/dL — ABNORMAL LOW (ref 30.0–36.0)
MCV: 60.1 fL — AB (ref 78.0–100.0)
MONO ABS: 0.9 10*3/uL (ref 0.1–1.0)
MONOS PCT: 12 % (ref 3–12)
MPV: 9.3 fL (ref 8.6–12.4)
NEUTROS ABS: 5 10*3/uL (ref 1.7–7.7)
Neutrophils Relative %: 66 % (ref 43–77)
Platelets: 531 10*3/uL — ABNORMAL HIGH (ref 150–400)
RBC: 4.06 MIL/uL (ref 3.87–5.11)
RDW: 21.8 % — ABNORMAL HIGH (ref 11.5–15.5)
WBC: 7.5 10*3/uL (ref 4.0–10.5)

## 2014-11-15 NOTE — Addendum Note (Signed)
Addended by: Dolan Amen D on: 11/15/2014 09:59 AM   Modules accepted: Orders

## 2014-11-16 ENCOUNTER — Encounter (HOSPITAL_COMMUNITY): Payer: Self-pay

## 2014-11-16 ENCOUNTER — Emergency Department (HOSPITAL_COMMUNITY): Payer: Medicaid Other

## 2014-11-16 ENCOUNTER — Emergency Department (HOSPITAL_COMMUNITY)
Admission: EM | Admit: 2014-11-16 | Discharge: 2014-11-16 | Disposition: A | Discharge status: Home / Self Care | Payer: Medicaid Other | Attending: Emergency Medicine | Admitting: Emergency Medicine

## 2014-11-16 DIAGNOSIS — R1012 Left upper quadrant pain: Secondary | ICD-10-CM | POA: Diagnosis present

## 2014-11-16 DIAGNOSIS — N390 Urinary tract infection, site not specified: Secondary | ICD-10-CM | POA: Diagnosis not present

## 2014-11-16 DIAGNOSIS — Z3202 Encounter for pregnancy test, result negative: Secondary | ICD-10-CM | POA: Diagnosis not present

## 2014-11-16 DIAGNOSIS — Z79899 Other long term (current) drug therapy: Secondary | ICD-10-CM | POA: Diagnosis not present

## 2014-11-16 DIAGNOSIS — Z21 Asymptomatic human immunodeficiency virus [HIV] infection status: Secondary | ICD-10-CM | POA: Insufficient documentation

## 2014-11-16 DIAGNOSIS — D649 Anemia, unspecified: Secondary | ICD-10-CM | POA: Diagnosis not present

## 2014-11-16 DIAGNOSIS — R1010 Upper abdominal pain, unspecified: Secondary | ICD-10-CM

## 2014-11-16 LAB — URINALYSIS, ROUTINE W REFLEX MICROSCOPIC
Bilirubin Urine: NEGATIVE
Glucose, UA: NEGATIVE mg/dL
Ketones, ur: NEGATIVE mg/dL
NITRITE: NEGATIVE
PH: 6.5 (ref 5.0–8.0)
PROTEIN: NEGATIVE mg/dL
Specific Gravity, Urine: 1.009 (ref 1.005–1.030)
UROBILINOGEN UA: 1 mg/dL (ref 0.0–1.0)

## 2014-11-16 LAB — HIV-1 RNA QUANT-NO REFLEX-BLD
HIV 1 RNA Quant: 12080 copies/mL — ABNORMAL HIGH (ref <20)
HIV-1 RNA Quant, Log: 4.08 {Log} — ABNORMAL HIGH (ref <1.30)

## 2014-11-16 LAB — HCG, QUANTITATIVE, PREGNANCY

## 2014-11-16 LAB — CBC
HEMATOCRIT: 24.2 % — AB (ref 36.0–46.0)
Hemoglobin: 7.1 g/dL — ABNORMAL LOW (ref 12.0–15.0)
MCH: 17.2 pg — ABNORMAL LOW (ref 26.0–34.0)
MCHC: 29.3 g/dL — AB (ref 30.0–36.0)
MCV: 58.7 fL — ABNORMAL LOW (ref 78.0–100.0)
PLATELETS: 492 10*3/uL — AB (ref 150–400)
RBC: 4.12 MIL/uL (ref 3.87–5.11)
RDW: 20.9 % — AB (ref 11.5–15.5)
WBC: 6.4 10*3/uL (ref 4.0–10.5)

## 2014-11-16 LAB — COMPREHENSIVE METABOLIC PANEL
ALT: 10 U/L — ABNORMAL LOW (ref 14–54)
AST: 17 U/L (ref 15–41)
Albumin: 3.2 g/dL — ABNORMAL LOW (ref 3.5–5.0)
Alkaline Phosphatase: 65 U/L (ref 38–126)
Anion gap: 7 (ref 5–15)
BILIRUBIN TOTAL: 0.6 mg/dL (ref 0.3–1.2)
BUN: <5 mg/dL — ABNORMAL LOW (ref 6–20)
CO2: 24 mmol/L (ref 22–32)
Calcium: 8.7 mg/dL — ABNORMAL LOW (ref 8.9–10.3)
Chloride: 106 mmol/L (ref 101–111)
Creatinine, Ser: 0.56 mg/dL (ref 0.44–1.00)
GFR calc Af Amer: >60 mL/min (ref >60)
GFR calc non Af Amer: >60 mL/min (ref >60)
GLUCOSE: 108 mg/dL — AB (ref 65–99)
POTASSIUM: 3.5 mmol/L (ref 3.5–5.1)
Sodium: 137 mmol/L (ref 135–145)
Total Protein: 8.2 g/dL — ABNORMAL HIGH (ref 6.5–8.1)

## 2014-11-16 LAB — URINE MICROSCOPIC-ADD ON

## 2014-11-16 LAB — T-HELPER CELL (CD4) - (RCID CLINIC ONLY)
CD4 % Helper T Cell: 3 % — ABNORMAL LOW (ref 33–55)
CD4 T Cell Abs: 30 /uL — ABNORMAL LOW (ref 400–2700)

## 2014-11-16 LAB — LIPASE, BLOOD: LIPASE: 24 U/L (ref 22–51)

## 2014-11-16 MED ORDER — CEPHALEXIN 500 MG PO CAPS: 1000.0000 mg | ORAL_CAPSULE | Freq: Two times a day (BID) | ORAL | Status: DC

## 2014-11-16 MED ORDER — SULFAMETHOXAZOLE-TRIMETHOPRIM 800-160 MG PO TABS: 1.0000 | ORAL_TABLET | Freq: Once | ORAL | Status: DC

## 2014-11-16 NOTE — ED Notes (Signed)
Pt presents with epigastric pain x 2 days; reports pain is intermittent and radiates to mid-scapula area.  -nausea, vomiting or diarrhea.  Pt denies dysuria.

## 2014-11-16 NOTE — ED Provider Notes (Signed)
CSN: [id number]     Arrival date & time 11/16/14  1315 History   First MD Initiated Contact with Patient 11/16/14 1526     No chief complaint on file.    (Consider location/radiation/quality/duration/timing/severity/associated sxs/prior Treatment) HPI   44 year old female with history of HIV currently on Odefsey, history of anemia taking iron supplementation here for evaluation of abd pain. Patient reports for the past 2 days she has had intermittent sharp pain to her left lower chest and left upper quadrant abdomen that radiates towards her back. Pain lasting for 10-15 minutes and resolved without any specific treatment. Pain is currently 7/10. She endorse occasional nonproductive cough for the past 2 days. No specific treatment tried. Denies any post prandial pain. Denies fever, chills, shortness of breath, hemoptysis, nausea vomiting diarrhea, dysuria, vaginal bleeding, vaginal discharge, bowel or bladder problem. She does not know her CD4 count or viral load. She has history of anemia. She denies lightheadedness or dizziness. She admits to drinking alcohol socially. No history of diabetes. She denies any history of shingle and denies having any rash. Denies any recent trauma.  Past Medical History  Diagnosis Date  . Anemia   . HIV disease    Past Surgical History  Procedure Laterality Date  . Cesarean section     Family History  Problem Relation Age of Onset  . Hyperlipidemia Mother   . Cancer Father    History  Substance Use Topics  . Smoking status: Never Smoker   . Smokeless tobacco: Never Used  . Alcohol Use: 0.6 oz/week    1 Cans of beer per week     Comment: occa   OB History    Gravida Para Term Preterm AB TAB SAB Ectopic Multiple Living   4 4 4             Review of Systems  All other systems reviewed and are negative.     Allergies  Review of patient's allergies indicates no known allergies.  Home Medications   Prior to Admission medications   Medication  Sig Start Date End Date Taking? Authorizing Provider  acetaminophen (TYLENOL) 325 MG tablet Take 650 mg by mouth every 6 (six) hours as needed.    Historical Provider, MD  azithromycin (ZITHROMAX) 200 MG/5ML suspension Take 30 mLs (1,200 mg total) by mouth once a week. Take every Wednesday. 09/26/14   Michel Bickers, MD  emtricitabine-rilpivir-tenofovir AF (ODEFSEY) 200-25-25 MG TABS per tablet Take 1 tablet by mouth daily with supper. 09/26/14   Michel Bickers, MD  fluconazole (DIFLUCAN) 100 MG tablet Take 1 tablet (100 mg total) by mouth daily. 09/26/14   Michel Bickers, MD  iron polysaccharides (POLY-IRON 150) 150 MG capsule Take 1 capsule (150 mg total) by mouth daily. 09/26/14   Michel Bickers, MD  sulfamethoxazole-trimethoprim (BACTRIM,SEPTRA) 200-40 MG/5ML suspension Take 20 mLs (160 mg of trimethoprim total) by mouth daily. 09/26/14   Michel Bickers, MD   BP 125/71 mmHg  Pulse 77  Temp(Src) 98.3 F (36.8 C) (Oral)  Resp 16  SpO2 100%  LMP 11/16/2014 Physical Exam  Constitutional: She is oriented to person, place, and time. She appears well-developed and well-nourished. No distress.  Frail appearing African-American female appears to be in no acute distress, nontoxic  HENT:  Head: Atraumatic.  Mouth/Throat: Oropharynx is clear and moist.  Eyes: Conjunctivae are normal.  Neck: Neck supple.  Cardiovascular: Normal rate and regular rhythm.   Pulmonary/Chest: Effort normal and breath sounds normal. She exhibits tenderness (Mild tenderness to left  low anterior ribs on palpation without crepitus or emphysema. No rash noted.).  Abdominal: Soft. Bowel sounds are normal. She exhibits no distension. There is no tenderness.  Negative Murphy sign, no pain at McBurney's point.  Musculoskeletal: She exhibits no edema.  Neurological: She is alert and oriented to person, place, and time.  Skin: No rash noted.  Psychiatric: She has a normal mood and affect.  Nursing note and vitals reviewed.   ED  Course  Procedures (including critical care time)  Patient here with upper abdominal pain.  However her abdomen is nontender on exam. . She also endorsed occasional cough, And having reproducible left lower anterior ribs without rash. No shortness of breath or dyspnea on exertion concerning for PE. Urine shows evidence of urinary tract infection although she denies any significant urinary discomfort. Patient has normal lipase, low suspicion for pancreatitis or cholecystitis. She has history of anemia, her hemoglobin is 7.1, at her baseline and she is asymptomatic. Plan to obtain chest x-ray.  4:24 PM Chest x-ray shows no evidence of focal infiltrate concerning for pneumonia. The remainder of her labs are reassuring. She does not have rash concerning for shingles. At this time, plan to treat for suspected urinary tract infection with Keflex. Urine culture sent. Patient otherwise stable for discharge. Return precautions discussed.  Labs Review Labs Reviewed  COMPREHENSIVE METABOLIC PANEL - Abnormal; Notable for the following:    Glucose, Bld 108 (*)    BUN <5 (*)    Calcium 8.7 (*)    Total Protein 8.2 (*)    Albumin 3.2 (*)    ALT 10 (*)    All other components within normal limits  CBC - Abnormal; Notable for the following:    Hemoglobin 7.1 (*)    HCT 24.2 (*)    MCV 58.7 (*)    MCH 17.2 (*)    MCHC 29.3 (*)    RDW 20.9 (*)    Platelets 492 (*)    All other components within normal limits  URINALYSIS, ROUTINE W REFLEX MICROSCOPIC (NOT AT [facility]) - Abnormal; Notable for the following:    APPearance CLOUDY (*)    Hgb urine dipstick MODERATE (*)    Leukocytes, UA LARGE (*)    All other components within normal limits  URINE MICROSCOPIC-ADD ON - Abnormal; Notable for the following:    Bacteria, UA MANY (*)    All other components within normal limits  URINE CULTURE  LIPASE, BLOOD  HCG, QUANTITATIVE, PREGNANCY    Imaging Review No results found.   EKG Interpretation None       MDM   Final diagnoses:  Upper abdominal pain  UTI (lower urinary tract infection)    BP 124/64 mmHg  Pulse 78  Temp(Src) 98.3 F (36.8 C) (Oral)  Resp 16  SpO2 100%  LMP 11/16/2014  I have reviewed nursing notes and vital signs. I reviewed available ER/hospitalization records through the EMR     Domenic Moras, PA-C 11/16/14 [place], MD 11/17/14 [phone]

## 2014-11-16 NOTE — Discharge Instructions (Signed)
Abdominal Pain, Women Abdominal (stomach, pelvic, or belly) pain can be caused by many things. It is important to tell your doctor:  The location of the pain.  Does it come and go or is it present all the time?  Are there things that start the pain (eating certain foods, exercise)?  Are there other symptoms associated with the pain (fever, nausea, vomiting, diarrhea)? All of this is helpful to know when trying to find the cause of the pain. CAUSES   Stomach: virus or bacteria infection, or ulcer.  Intestine: appendicitis (inflamed appendix), regional ileitis (Crohn's disease), ulcerative colitis (inflamed colon), irritable bowel syndrome, diverticulitis (inflamed diverticulum of the colon), or cancer of the stomach or intestine.  Gallbladder disease or stones in the gallbladder.  Kidney disease, kidney stones, or infection.  Pancreas infection or cancer.  Fibromyalgia (pain disorder).  Diseases of the female organs:  Uterus: fibroid (non-cancerous) tumors or infection.  Fallopian tubes: infection or tubal pregnancy.  Ovary: cysts or tumors.  Pelvic adhesions (scar tissue).  Endometriosis (uterus lining tissue growing in the pelvis and on the pelvic organs).  Pelvic congestion syndrome (female organs filling up with blood just before the menstrual period).  Pain with the menstrual period.  Pain with ovulation (producing an egg).  Pain with an IUD (intrauterine device, birth control) in the uterus.  Cancer of the female organs.  Functional pain (pain not caused by a disease, may improve without treatment).  Psychological pain.  Depression. DIAGNOSIS  Your doctor will decide the seriousness of your pain by doing an examination.  Blood tests.  X-rays.  Ultrasound.  CT scan (computed tomography, special type of X-ray).  MRI (magnetic resonance imaging).  Cultures, for infection.  Barium enema (dye inserted in the large intestine, to better view it with  X-rays).  Colonoscopy (looking in intestine with a lighted tube).  Laparoscopy (minor surgery, looking in abdomen with a lighted tube).  Major abdominal exploratory surgery (looking in abdomen with a large incision). TREATMENT  The treatment will depend on the cause of the pain.   Many cases can be observed and treated at home.  Over-the-counter medicines recommended by your caregiver.  Prescription medicine.  Antibiotics, for infection.  Birth control pills, for painful periods or for ovulation pain.  Hormone treatment, for endometriosis.  Nerve blocking injections.  Physical therapy.  Antidepressants.  Counseling with a psychologist or psychiatrist.  Minor or major surgery. HOME CARE INSTRUCTIONS   Do not take laxatives, unless directed by your caregiver.  Take over-the-counter pain medicine only if ordered by your caregiver. Do not take aspirin because it can cause an upset stomach or bleeding.  Try a clear liquid diet (broth or water) as ordered by your caregiver. Slowly move to a bland diet, as tolerated, if the pain is related to the stomach or intestine.  Have a thermometer and take your temperature several times a day, and record it.  Bed rest and sleep, if it helps the pain.  Avoid sexual intercourse, if it causes pain.  Avoid stressful situations.  Keep your follow-up appointments and tests, as your caregiver orders.  If the pain does not go away with medicine or surgery, you may try:  Acupuncture.  Relaxation exercises (yoga, meditation).  Group therapy.  Counseling. SEEK MEDICAL CARE IF:   You notice certain foods cause stomach pain.  Your home care treatment is not helping your pain.  You need stronger pain medicine.  You want your IUD removed.  You feel faint or  lightheaded.  You develop nausea and vomiting.  You develop a rash.  You are having side effects or an allergy to your medicine. SEEK IMMEDIATE MEDICAL CARE IF:   Your  pain does not go away or gets worse.  You have a fever.  Your pain is felt only in portions of the abdomen. The right side could possibly be appendicitis. The left lower portion of the abdomen could be colitis or diverticulitis.  You are passing blood in your stools (bright red or black tarry stools, with or without vomiting).  You have blood in your urine.  You develop chills, with or without a fever.  You pass out. MAKE SURE YOU:   Understand these instructions.  Will watch your condition.  Will get help right away if you are not doing well or get worse. Document Released: 01/26/2007 Document Revised: 08/15/2013 Document Reviewed: 02/15/2009 [facility] Patient Information 2015 [place], [place]. This information is not intended to replace advice given to you by your health care provider. Make sure you discuss any questions you have with your health care provider.  Asymptomatic Bacteriuria Asymptomatic bacteriuria is the presence of a large number of bacteria in your urine without the usual symptoms of burning or frequent urination. The following conditions increase the risk of asymptomatic bacteriuria:  Diabetes mellitus.  Advanced age.  Pregnancy in the first trimester.  Kidney stones.  Kidney transplants.  Leaky kidney tube valve in young children (reflux). Treatment for this condition is not needed in most people and can lead to other problems such as too much yeast and growth of resistant bacteria. However, some people, such as pregnant women, do need treatment to prevent kidney infection. Asymptomatic bacteriuria in pregnancy is also associated with fetal growth restriction, premature labor, and newborn death. HOME CARE INSTRUCTIONS Monitor your condition for any changes. The following actions may help to relieve any discomfort you are feeling:  Drink enough water and fluids to keep your urine clear or pale yellow. Go to the bathroom more often to keep your bladder  empty.  Keep the area around your vagina and rectum clean. Wipe yourself from front to back after urinating. SEEK IMMEDIATE MEDICAL CARE IF:  You develop signs of an infection such as:  Burning with urination.  Frequency of voiding.  Back pain.  Fever.  You have blood in the urine.  You develop a fever. MAKE SURE YOU:  Understand these instructions.  Will watch your condition.  Will get help right away if you are not doing well or get worse. Document Released: 03/31/2005 Document Revised: 08/15/2013 Document Reviewed: 09/20/2012 [facility] Patient Information 2015 [place], [place]. This information is not intended to replace advice given to you by your health care provider. Make sure you discuss any questions you have with your health care provider.

## 2014-11-18 LAB — URINE CULTURE

## 2014-11-20 ENCOUNTER — Telehealth (HOSPITAL_COMMUNITY): Payer: Self-pay

## 2014-11-20 NOTE — Telephone Encounter (Signed)
Post ED Visit - Positive Culture Follow-up  Culture report reviewed by antimicrobial stewardship pharmacist: []  Wes Jolivue, Pharm.D., BCPS []  Heide Guile, Pharm.D., BCPS []  Alycia Rossetti, Pharm.D., BCPS []  [place], [place].D., BCPS, AAHIVP [x]  Legrand Como, Pharm.D., BCPS, AAHIVP []  Isac Sarna, Pharm.D., BCPS  Positive Urine culture, >/= 100,000 colonies -> E Coli Treated with Cephalexin, organism sensitive to the same and no further patient follow-up is required at this time.Dortha Kern 11/20/2014, 5:46 AM

## 2014-11-21 ENCOUNTER — Ambulatory Visit: Payer: Medicaid Other | Admitting: Internal Medicine

## 2014-11-21 ENCOUNTER — Telehealth: Payer: Self-pay | Admitting: *Deleted

## 2014-11-21 NOTE — Telephone Encounter (Signed)
Left message for pt to call for a new appt

## 2014-12-05 ENCOUNTER — Telehealth: Payer: Self-pay | Admitting: *Deleted

## 2014-12-05 NOTE — Telephone Encounter (Signed)
rescheduled pap smear appt to 12/15/14 @ 1000

## 2014-12-08 ENCOUNTER — Ambulatory Visit: Payer: Medicaid Other

## 2014-12-15 ENCOUNTER — Ambulatory Visit: Payer: Medicaid Other

## 2015-03-15 ENCOUNTER — Emergency Department (HOSPITAL_COMMUNITY)
Admission: EM | Admit: 2015-03-15 | Discharge: 2015-03-15 | Disposition: A | Discharge status: Home / Self Care | Payer: Medicaid Other | Attending: Emergency Medicine | Admitting: Emergency Medicine

## 2015-03-15 ENCOUNTER — Encounter (HOSPITAL_COMMUNITY): Payer: Self-pay

## 2015-03-15 ENCOUNTER — Emergency Department (HOSPITAL_COMMUNITY): Payer: Medicaid Other

## 2015-03-15 DIAGNOSIS — Z79899 Other long term (current) drug therapy: Secondary | ICD-10-CM | POA: Insufficient documentation

## 2015-03-15 DIAGNOSIS — Z862 Personal history of diseases of the blood and blood-forming organs and certain disorders involving the immune mechanism: Secondary | ICD-10-CM | POA: Insufficient documentation

## 2015-03-15 DIAGNOSIS — B20 Human immunodeficiency virus [HIV] disease: Secondary | ICD-10-CM | POA: Insufficient documentation

## 2015-03-15 DIAGNOSIS — R0789 Other chest pain: Secondary | ICD-10-CM | POA: Insufficient documentation

## 2015-03-15 DIAGNOSIS — R05 Cough: Secondary | ICD-10-CM

## 2015-03-15 DIAGNOSIS — R059 Cough, unspecified: Secondary | ICD-10-CM

## 2015-03-15 MED ORDER — HYDROCODONE-HOMATROPINE 5-1.5 MG/5ML PO SYRP
5.0000 mL | ORAL_SOLUTION | Freq: Once | ORAL | Status: AC
  Administered 2015-03-15: 5 mL via ORAL
  Filled 2015-03-15: qty 5

## 2015-03-15 MED ORDER — HYDROCODONE-HOMATROPINE 5-1.5 MG/5ML PO SYRP: 5.0000 mL | ORAL_SOLUTION | Freq: Four times a day (QID) | ORAL | Status: DC | PRN

## 2015-03-15 NOTE — ED Notes (Signed)
Declined W/C at D/C and was escorted to lobby by RN. 

## 2015-03-15 NOTE — ED Notes (Signed)
Pt presents with 1 week h/o productive cough with yellow phlegm and nasal congestion.  Pt reports chest tightness only with cough, denies any pain at rest or with movement.  Pt denies any sick contact or fever.

## 2015-03-15 NOTE — ED Provider Notes (Signed)
CSN: [id number]     Arrival date & time 03/15/15  [id number] History   First MD Initiated Contact with Patient 03/15/15 [phone]     Chief Complaint  Patient presents with  . Cough   (Consider location/radiation/quality/duration/timing/severity/associated sxs/prior Treatment) HPI Shirley White is a 44 year old female with a history of HIV who presents for a yellow sputum cough for the past week. She also reports chest tightness but only with her cough. She denies any pain at rest, or with movement. She denies any treatment prior to arrival. Her pain is 7/10 with coughing. She denies any fever, chills, chest pain, shortness of breath, sore throat, abdominal pain, nausea, vomiting, or diarrhea.  Past Medical History  Diagnosis Date  . Anemia   . HIV disease [facility])    Past Surgical History  Procedure Laterality Date  . Cesarean section     Family History  Problem Relation Age of Onset  . Hyperlipidemia Mother   . Cancer Father    Social History  Substance Use Topics  . Smoking status: Never Smoker   . Smokeless tobacco: Never Used  . Alcohol Use: 0.6 oz/week    1 Cans of beer per week     Comment: occa   OB History    Gravida Para Term Preterm AB TAB SAB Ectopic Multiple Living   4 4 4             Review of Systems  Constitutional: Negative for fever and chills.  Respiratory: Positive for cough and chest tightness. Negative for shortness of breath.   Cardiovascular: Negative for chest pain.  All other systems reviewed and are negative.     Allergies  Review of patient's allergies indicates no known allergies.  Home Medications   Prior to Admission medications   Medication Sig Start Date End Date Taking? Authorizing Provider  azithromycin (ZITHROMAX) 200 MG/5ML suspension Take 30 mLs (1,200 mg total) by mouth once a week. Take every Wednesday. 09/26/14   Michel Bickers, MD  cephALEXin (KEFLEX) 500 MG capsule Take 2 capsules (1,000 mg total) by mouth 2 (two) times daily. 11/16/14    Domenic Moras, PA-C  emtricitabine-rilpivir-tenofovir AF (ODEFSEY) 200-25-25 MG TABS per tablet Take 1 tablet by mouth daily with supper. 09/26/14   Michel Bickers, MD  fluconazole (DIFLUCAN) 100 MG tablet Take 1 tablet (100 mg total) by mouth daily. 09/26/14   Michel Bickers, MD  HYDROcodone-homatropine [facility]) 5-1.5 MG/5ML syrup Take 5 mLs by mouth every 6 (six) hours as needed for cough. 03/15/15   Maudine Kluesner Patel-Mills, PA-C  ibuprofen (ADVIL,MOTRIN) 200 MG tablet Take 200 mg by mouth every 6 (six) hours as needed for moderate pain.    Historical Provider, MD  iron polysaccharides (POLY-IRON 150) 150 MG capsule Take 1 capsule (150 mg total) by mouth daily. 09/26/14   Michel Bickers, MD  sulfamethoxazole-trimethoprim (BACTRIM,SEPTRA) 200-40 MG/5ML suspension Take 20 mLs (160 mg of trimethoprim total) by mouth daily. 09/26/14   Michel Bickers, MD   BP 128/78 mmHg  Pulse 78  Temp(Src) 98.2 F (36.8 C) (Oral)  Resp 16  Ht 4\' 9"  (1.448 m)  Wt 44.453 kg  BMI 21.20 kg/m2  SpO2 100%  LMP 03/01/2015 (Approximate) Physical Exam  Constitutional: She is oriented to person, place, and time. She appears well-developed and well-nourished.  HENT:  Head: Normocephalic and atraumatic.  Eyes: Conjunctivae are normal.  Neck: Normal range of motion. Neck supple.  Cardiovascular: Normal rate, regular rhythm and normal heart sounds.   Pulmonary/Chest: Effort normal and breath  sounds normal. No accessory muscle usage. No respiratory distress. She has no decreased breath sounds. She has no wheezes. She has no rales.  Lungs are clear to auscultation bilaterally. No wheezing or decreased breath sounds. No respiratory distress or use of accessory muscles.  Coughing on exam.  Abdominal: Soft. There is no tenderness.  Musculoskeletal: Normal range of motion.  Neurological: She is alert and oriented to person, place, and time.  Skin: Skin is warm and dry.  Nursing note and vitals reviewed.   ED Course  Procedures  (including critical care time) Labs Review Labs Reviewed - No data to display  Imaging Review Dg Chest 2 View  03/15/2015  CLINICAL DATA:  Cough and history of HIV disease. EXAM: CHEST  2 VIEW COMPARISON:  11/16/2014 FINDINGS: Both lungs are clear. Negative for airspace disease or pulmonary edema. Heart and mediastinum are within normal limits. Trachea is midline. There is right cervical rib. No acute bone abnormality. Negative for pleural effusions. IMPRESSION: No active cardiopulmonary disease. Electronically Signed   By: Markus Daft M.D.   On: 03/15/2015 08:56   I have personally reviewed and evaluated these image results as part of my medical decision-making.  ED ECG REPORT   Date: 03/15/2015  Rate: 85  Rhythm: normal sinus rhythm  QRS Axis: right  Intervals: normal  ST/T Wave abnormalities: normal  Conduction Disutrbances:none  Narrative Interpretation:   Old EKG Reviewed: unchanged  I have personally reviewed the EKG tracing and agree with the computerized printout as noted.   MDM   Final diagnoses:  Cough   Patient with HIV presents for 7 days of productive cough. Her vital signs are stable and she is well-appearing. 100% oxygen on room air and afebrile. Chest x-ray was obtained to rule out pneumonia or TB which is negative. Patient has no other concerning signs or symptoms. EKG was obtained by triage. Patient denies any chest pain. She was given Hycodan for cough. I discussed return precautions with the patient as well as follow-up and she verbally agrees with the plan. Medications  HYDROcodone-homatropine (HYCODAN) 5-1.5 MG/5ML syrup 5 mL (5 mLs Oral Given 03/15/15 0938)   Filed Vitals:   03/15/15 0817 03/15/15 0945  BP: 129/78 128/78  Pulse: 78 78  Temp: 98.2 F (36.8 C)   Resp: 18 [address], PA-C 03/15/15 1321  Leo Grosser, MD 03/16/15 [phone]

## 2015-03-15 NOTE — Discharge Instructions (Signed)
Cough, Adult Follow-up with a primary care provider using the resource Below. Return for fever, no improvement in 48 hours. A cough helps to clear your throat and lungs. A cough may last only 2-3 weeks (acute), or it may last longer than 8 weeks (chronic). Many different things can cause a cough. A cough may be a sign of an illness or another medical condition. HOME CARE  Pay attention to any changes in your cough.  Take medicines only as told by your doctor.  If you were prescribed an antibiotic medicine, take it as told by your doctor. Do not stop taking it even if you start to feel better.  Talk with your doctor before you try using a cough medicine.  Drink enough fluid to keep your pee (urine) clear or pale yellow.  If the air is dry, use a cold steam vaporizer or humidifier in your home.  Stay away from things that make you cough at work or at home.  If your cough is worse at night, try using extra pillows to raise your head up higher while you sleep.  Do not smoke, and try not to be around smoke. If you need help quitting, ask your doctor.  Do not have caffeine.  Do not drink alcohol.  Rest as needed. GET HELP IF:  You have new problems (symptoms).  You cough up yellow fluid (pus).  Your cough does not get better after 2-3 weeks, or your cough gets worse.  Medicine does not help your cough and you are not sleeping well.  You have pain that gets worse or pain that is not helped with medicine.  You have a fever.  You are losing weight and you do not know why.  You have night sweats. GET HELP RIGHT AWAY IF:  You cough up blood.  You have trouble breathing.  Your heartbeat is very fast.   This information is not intended to replace advice given to you by your health care provider. Make sure you discuss any questions you have with your health care provider.   Document Released: 12/12/2010 Document Revised: 12/20/2014 Document Reviewed: 06/07/2014 Elsevier  Interactive Patient Education 2016 [employer].  Emergency Department Resource Guide 1) Find a Doctor and Pay Out of Pocket Although you won't have to find out who is covered by your insurance plan, it is a good idea to ask around and get recommendations. You will then need to call the office and see if the doctor you have chosen will accept you as a new patient and what types of options they offer for patients who are self-pay. Some doctors offer discounts or will set up payment plans for their patients who do not have insurance, but you will need to ask so you aren't surprised when you get to your appointment.  2) Contact Your Local Health Department Not all health departments have doctors that can see patients for sick visits, but many do, so it is worth a call to see if yours does. If you don't know where your local health department is, you can check in your phone book. The CDC also has a tool to help you locate your state's health department, and many state websites also have listings of all of their local health departments.  3) Find a [facility] If your illness is not likely to be very severe or complicated, you may want to try a walk in clinic. These are popping up all over the country in pharmacies, drugstores, and shopping  centers. They're usually staffed by nurse practitioners or physician assistants that have been trained to treat common illnesses and complaints. They're usually fairly quick and inexpensive. However, if you have serious medical issues or chronic medical problems, these are probably not your best option.  No Primary Care Doctor: - Call Health Connect at  [phone] - they can help you locate a primary care doctor that  accepts your insurance, provides certain services, etc. - Physician Referral Service- [phone]  Chronic Pain Problems: Organization         Address  Phone   Notes  [facility]  [phone] Patients need to be referred by  their primary care doctor.   Medication Assistance: Organization         Address  Phone   Notes  [facility] Medication [facility] [address] [place], [place] [address] [phone] --Must be a resident of [facility] -- Must have NO insurance coverage whatsoever (no Medicaid/ Medicare, etc.) -- The pt. MUST have a primary care doctor that directs their care regularly and follows them in the community   MedAssist  [phone]   [employer]  [phone]    Agencies that provide inexpensive medical care: Organization         Address  Phone   Notes  [place]  [phone]   [facility]    [phone]   [facility] [place], [place] [address] [phone]   [place] [address], [place] [phone]   Planned Parenthood    [phone]   [facility]    [phone]   [place] [address], [place] Phone:  [phone], Fax:  [phone] Hours of Operation:  9 am - 6 pm, M-F.  Also accepts Medicaid/Medicare and self-pay.  [facility] for [place], [address], [place] Phone: [phone], Fax: [phone]. Hours of Operation:  8:30 am - 5:30 pm, M-F.  Also accepts Medicaid and self-pay.  [facility] [place] [address] [place] Phone: [phone]   [place], [place], [place] [phone], Ext. [phone] Mondays & Thursdays: 7-9 AM.  First 15 patients are seen on a first come, first serve basis.    [place] Providers:  Organization         Address  Phone   Notes  [facility] [address], [place] [phone] Also accepts self-pay patients.  [facility] [id number] [place], [place]  [phone]   [place], [address], [place] [phone]   [facility] [address], [place] [phone]   Lucianne Lei [address], [place]   [phone] Only accepts [place] Access [place] patients after they have their name applied to their card.   Self-Pay (no insurance) in [facility]:  Organization         Address  Phone   Notes  Sickle Cell Patients, [facility] [place] [phone]   [facility] [place] [phone]   [facility] [place]  [place] [address] [place], [place] [phone]  [facility]/Dr. Osei-Bonsu  [address], [place] or [address], [place] [phone] Phone number for both [place] and [place] locations is the same.  Urgent Medical and [facility] [address] [place] [phone]   [facility] [place] [address] [place] or [address] [phone]   [facility] [address] [place] [phone], phone; [phone], fax Sees patients 1st and 3rd Saturday of every month.  Must not qualify for public or private insurance (i.e. Medicaid, Medicare, [place] Health Choice, Veterans' Benefits)  Household income should be no more than 200% of the poverty level The clinic cannot treat you if you are pregnant or think you are pregnant  Sexually transmitted diseases are not treated at the clinic.    Dental Care: Organization         Address  Phone  Notes  [facility] [place] [phone] Accepts children up to age 5 who are enrolled in [place] or [place]; pregnant women with a Medicaid card; and children who have applied for Medicaid or [place] Health Choice, but were declined, whose parents can pay a reduced  fee at time of service.  [facility]  [address], [place] [phone] Accepts children up to age 76 who are enrolled in [place] or [place]; pregnant women with a Medicaid card; and children who have applied for Medicaid or [place] Health Choice, but were declined, whose parents can pay a reduced fee at time of service.  [place] Adult Dental Access PROGRAM  [place] [phone] Patients are seen by appointment only. Walk-ins are not accepted. [place] will see patients 58 years of age and older. Monday - Tuesday (8am-5pm) Most Wednesdays (8:30-5pm) $30 per visit, cash only  [facility] Adult Dental Access PROGRAM  [address], [facility] [phone] Patients are seen by appointment only. Walk-ins are not accepted. [place] will see patients 43 years of age and older. One Wednesday Evening (Monthly: Volunteer Based).  $30 per visit, cash only  [place]  [phone] for adults; Children under age 73, call Graduate Pediatric Dentistry at [phone]. Children aged 21-14, please call [phone] to request a pediatric application.  Dental services are provided in all areas of dental care including fillings, crowns and bridges, complete and partial dentures, implants, gum treatment, root canals, and extractions. Preventive care is also provided. Treatment is provided to both adults and children. Patients are selected via a lottery and there is often a waiting list.   [facility] [address] [place]  [phone] [email or web address]   [facility] [address] [place], [place] [phone], Ext. [phone] Second and Fourth Thursday of each month, opens at 6:30 AM; Clinic ends at 9 AM.  Patients are seen on a first-come first-served basis, and a limited number are seen during each clinic.   [facility]  [address] Hillard Danker [place], [place] [phone]   Eligibility Requirements You must have lived in [place], [place], or [place] counties for at least the last three months.   You cannot be eligible for state or federal sponsored [employer], including [employer], [place], or [employer].   You generally cannot be eligible for healthcare insurance  through your employer.    How to apply: Eligibility screenings are held every Tuesday and Wednesday afternoon from 1:00 pm until 4:00 pm. You do not need an appointment for the interview!  [facility] [address] [place], [place]   [place]lie  [place] Department  [place]  [phone]    Behavioral Health Resources in the Community: Intensive Outpatient Programs Organization         Address  Phone  Notes  [place]. [address] [place], [place] [phone]   [facility] Outpatient [address] [place], [place]   ADS: Alcohol & Drug Svcs [address] [place], [place]   [place] [address]  [place], [place] or [phone]   Substance Abuse Resources Organization         Address  Phone  Notes  Alcohol and Drug Services  [phone]   [place]  [phone]   [facility] [place]   Chinita Pester  [phone]   Residential & Outpatient Substance Abuse Program  [phone]   Psychological Services Organization         Address  Phone  Notes  [facility] [place]  [place]  [phone]   [place] [address] [place] or [phone]    Mobile Crisis Teams Organization         Address  Phone  Notes  [facility], Mobile Crisis Care Unit  [phone]   Assertive Psychotherapeutic  Services  [address] [place], [place]   [place] Levels [address], [place] [phone]    Self-Help/Support Groups Organization         Address  Phone             Notes  [place]. of [place] - variety of support groups  [place] Call for more information  Narcotics Anonymous (NA), [facility]aring [facility] [address], [employer] [place]  2 meetings at this location   Special educational needs teacher         Address  Phone  Notes  [facility] Residential Treatment [place],    [place]  [phone]   [facility]  [address] [place] [id number], [place], [place]   [place], [place] [phone] Admissions: 8am-3pm M-F  Incentives Substance [place] [address],    [place], [place] [phone]   [facility] [address] [place], [place], [place]   [facility] [address],  [place], [place]   [facility]ight Programs - Intensive Outpatient [address] Kristeen Mans 1, [place], [place]   [facility].,  [place], [place] [phone] or [phone]   [facility]idential Treatment Services (RTS) [address] [place], [place] Accepts Medicaid  [facility] [place] [address]  [place] [phone] Substance Abuse/Addiction Treatment   [facility] Organization         Address  Phone  Notes  [facility]  [phone]   Domenic Schwab, PhD [address] [place], [place]   [phone] or [phone]   [place] Leonard Cyrus [place], [place] [phone]  [facility] [address], [place], [place] [phone] Insurance/Medicaid/sponsorship through [employer] and Families [address], [address] [place], [place] [phone] [place], [place] [phone]    Dr. Adele Schilder  [phone]   [facility] Dept. 1) [address], [place] 2) [place] 3)  [place] [address], [place] [phone]  [phone]   [place] [phone] or [phone] (After Hours)

## 2015-04-03 ENCOUNTER — Telehealth: Payer: Self-pay

## 2015-04-03 NOTE — Telephone Encounter (Signed)
Called [phone] to schedule appointment for Ms Shirley White to come in and see Dr Megan Salon, she does not remember ever coming here, but she said if doctor wanted to see her call back around January 4 or 5 and she would schedule an appointment.

## 2015-06-19 ENCOUNTER — Emergency Department (HOSPITAL_COMMUNITY): Payer: Medicaid Other

## 2015-06-19 ENCOUNTER — Emergency Department (HOSPITAL_COMMUNITY)
Admission: EM | Admit: 2015-06-19 | Discharge: 2015-06-20 | Disposition: A | Discharge status: Home / Self Care | Payer: Medicaid Other | Attending: Physician Assistant | Admitting: Physician Assistant

## 2015-06-19 ENCOUNTER — Encounter (HOSPITAL_COMMUNITY): Payer: Self-pay | Admitting: Emergency Medicine

## 2015-06-19 DIAGNOSIS — L03011 Cellulitis of right finger: Secondary | ICD-10-CM | POA: Insufficient documentation

## 2015-06-19 DIAGNOSIS — B20 Human immunodeficiency virus [HIV] disease: Secondary | ICD-10-CM

## 2015-06-19 DIAGNOSIS — J40 Bronchitis, not specified as acute or chronic: Secondary | ICD-10-CM

## 2015-06-19 DIAGNOSIS — Z792 Long term (current) use of antibiotics: Secondary | ICD-10-CM | POA: Insufficient documentation

## 2015-06-19 DIAGNOSIS — J209 Acute bronchitis, unspecified: Secondary | ICD-10-CM | POA: Insufficient documentation

## 2015-06-19 DIAGNOSIS — Z79899 Other long term (current) drug therapy: Secondary | ICD-10-CM | POA: Insufficient documentation

## 2015-06-19 DIAGNOSIS — D500 Iron deficiency anemia secondary to blood loss (chronic): Secondary | ICD-10-CM

## 2015-06-19 LAB — BASIC METABOLIC PANEL
Anion gap: 10 (ref 5–15)
BUN: <5 mg/dL — ABNORMAL LOW (ref 6–20)
CHLORIDE: 103 mmol/L (ref 101–111)
CO2: 24 mmol/L (ref 22–32)
Calcium: 9.1 mg/dL (ref 8.9–10.3)
Creatinine, Ser: 0.48 mg/dL (ref 0.44–1.00)
GFR calc Af Amer: >60 mL/min (ref >60)
GFR calc non Af Amer: >60 mL/min (ref >60)
Glucose, Bld: 88 mg/dL (ref 65–99)
POTASSIUM: 4.1 mmol/L (ref 3.5–5.1)
Sodium: 137 mmol/L (ref 135–145)

## 2015-06-19 LAB — I-STAT TROPONIN, ED: Troponin i, poc: 0 ng/mL (ref 0.00–0.08)

## 2015-06-19 LAB — CBC
HEMATOCRIT: 24.6 % — AB (ref 36.0–46.0)
Hemoglobin: 6.9 g/dL — CL (ref 12.0–15.0)
MCH: 15.6 pg — ABNORMAL LOW (ref 26.0–34.0)
MCHC: 28 g/dL — ABNORMAL LOW (ref 30.0–36.0)
MCV: 55.5 fL — ABNORMAL LOW (ref 78.0–100.0)
Platelets: 539 10*3/uL — ABNORMAL HIGH (ref 150–400)
RBC: 4.43 MIL/uL (ref 3.87–5.11)
RDW: 20.8 % — ABNORMAL HIGH (ref 11.5–15.5)
WBC: 6.4 10*3/uL (ref 4.0–10.5)

## 2015-06-19 MED ORDER — SULFAMETHOXAZOLE-TRIMETHOPRIM 800-160 MG PO TABS
1.0000 | ORAL_TABLET | Freq: Once | ORAL | Status: DC
  Filled 2015-06-19: qty 1

## 2015-06-19 MED ORDER — AZITHROMYCIN 250 MG PO TABS: 250.0000 mg | ORAL_TABLET | Freq: Every day | ORAL | Status: DC

## 2015-06-19 MED ORDER — SULFAMETHOXAZOLE-TRIMETHOPRIM 200-40 MG/5ML PO SUSP
20.0000 mL | Freq: Once | ORAL | Status: AC
  Administered 2015-06-20: 20 mL via ORAL
  Filled 2015-06-19: qty 20

## 2015-06-19 MED ORDER — AZITHROMYCIN 250 MG PO TABS
500.0000 mg | ORAL_TABLET | Freq: Once | ORAL | Status: AC
  Administered 2015-06-19: 500 mg via ORAL
  Filled 2015-06-19: qty 2

## 2015-06-19 MED ORDER — SULFAMETHOXAZOLE-TRIMETHOPRIM 800-160 MG PO TABS: 1.0000 | ORAL_TABLET | Freq: Two times a day (BID) | ORAL | Status: AC

## 2015-06-19 MED ORDER — BENZONATATE 100 MG PO CAPS: 100.0000 mg | ORAL_CAPSULE | Freq: Three times a day (TID) | ORAL | Status: DC

## 2015-06-19 MED ORDER — GUAIFENESIN-CODEINE 100-10 MG/5ML PO SOLN
5.0000 mL | Freq: Once | ORAL | Status: AC
  Administered 2015-06-20: 5 mL via ORAL
  Filled 2015-06-19: qty 5

## 2015-06-19 NOTE — ED Provider Notes (Signed)
CSN: [id number]     Arrival date & time 06/19/15  1314 History   First MD Initiated Contact with Patient 06/19/15 2008     Chief Complaint  Patient presents with  . Chest Pain     (Consider location/radiation/quality/duration/timing/severity/associated sxs/prior Treatment) HPI   HPI: Shirley White is a 45 year old is a HIV-positive female with a history of anemia who presents today for chest pain, cough, and blood in her sputum. Cough with sputum containing flecks of blood began 6 days ago and chest pain began yesterday. Pt states the chest pain is left-sided and sharp and comes and goes in episodes of 15-20 minutes and pain is increased with coughing. Denies increased pain with exertion, fever, diaphoresis, pain radiation to pain or arm, numbness or tingling of extremities, nausea, vomiting, weakness, or fatigue. States she has had some night sweats. Tried Dayquil and Nyquil at home with little relief. Current chest pain is rated as a 5/10 in severity. Denies sick contacts and did not receive flu shot. States she takes iron pills every other day for iron deficiency anemia and is currently taking HAART therapy for her HIV but is not sure what medications. Periods have been heavy which is normal for her. Recent visit with infectious disease in June 2016 showed a CD4 count of 30. Has not been rechecked. Questionable compliance with HAART therapy medications and prophylactic therapy.  Patient is well appearing and afebrile with normal vital signs.  PCP: No primary care provider on file.  Shirley White is a 45 y.o.  female  ROS: The patient denies diaphoresis, fever, headache, weakness (general or focal), confusion, change of vision,  dysphagia, aphagia, shortness of breath,  abdominal pains, nausea, vomiting, diarrhea, lower extremity swelling, rash, neck pain.  Past Medical History  Diagnosis Date  . Anemia   . HIV disease [facility])    Past Surgical History  Procedure Laterality Date  . Cesarean  section     Family History  Problem Relation Age of Onset  . Hyperlipidemia Mother   . Cancer Father    Social History  Substance Use Topics  . Smoking status: Never Smoker   . Smokeless tobacco: Never Used  . Alcohol Use: 0.6 oz/week    1 Cans of beer per week     Comment: occa   OB History    Gravida Para Term Preterm AB TAB SAB Ectopic Multiple Living   4 4 4             Review of Systems  Review of Systems All other systems negative except as documented in the HPI. All pertinent positives and negatives as reviewed in the HPI.   Allergies  Review of patient's allergies indicates no known allergies.  Home Medications   Prior to Admission medications   Medication Sig Start Date End Date Taking? Authorizing Provider  azithromycin (ZITHROMAX) 200 MG/5ML suspension Take 30 mLs (1,200 mg total) by mouth once a week. Take every Wednesday. 09/26/14  Yes Michel Bickers, MD  emtricitabine-rilpivir-tenofovir AF (ODEFSEY) 200-25-25 MG TABS per tablet Take 1 tablet by mouth daily with supper. 09/26/14  Yes Michel Bickers, MD  HYDROcodone-homatropine [facility]) 5-1.5 MG/5ML syrup Take 5 mLs by mouth every 6 (six) hours as needed for cough. 03/15/15  Yes Hanna Patel-Mills, PA-C  iron polysaccharides (POLY-IRON 150) 150 MG capsule Take 1 capsule (150 mg total) by mouth daily. 09/26/14  Yes Michel Bickers, MD  azithromycin (ZITHROMAX) 250 MG tablet Take 1 tablet (250 mg total) by mouth daily. Take first  2 tablets together, then 1 every day until finished. 06/19/15   Joanny Dupree Carlota Raspberry, PA-C  benzonatate (TESSALON) 100 MG capsule Take 1 capsule (100 mg total) by mouth every 8 (eight) hours. 06/19/15   Doneisha Ivey Carlota Raspberry, PA-C  sulfamethoxazole-trimethoprim (BACTRIM DS,SEPTRA DS) 800-160 MG tablet Take 1 tablet by mouth 2 (two) times daily. 06/19/15 06/26/15  Barnett Elzey Carlota Raspberry, PA-C   BP 118/71 mmHg  Pulse 85  Temp(Src) 99.2 F (37.3 C) (Oral)  Resp 25  SpO2 100%  LMP 06/17/2015 Physical Exam  Constitutional:  She appears well-developed and well-nourished. No distress.  HENT:  Head: Normocephalic and atraumatic.  Right Ear: Tympanic membrane and ear canal normal.  Left Ear: Tympanic membrane and ear canal normal.  Nose: Nose normal.  Mouth/Throat: Uvula is midline, oropharynx is clear and moist and mucous membranes are normal.  Eyes: Pupils are equal, round, and reactive to light.  Neck: Normal range of motion. Neck supple.  Cardiovascular: Normal rate and regular rhythm.   Pulmonary/Chest: Effort normal.  Abdominal: Soft.  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 Mildly tender. No associated erythema. NIV.  Neurological: She is alert.  Acting at baseline  Skin: Skin is warm and dry. No rash noted.  Nursing note and vitals reviewed.   ED Course  Procedures (including critical care time) Labs Review Labs Reviewed  BASIC METABOLIC PANEL - Abnormal; Notable for the following:    BUN <5 (*)    All other components within normal limits  CBC - Abnormal; Notable for the following:    Hemoglobin 6.9 (*)    HCT 24.6 (*)    MCV 55.5 (*)    MCH 15.6 (*)    MCHC 28.0 (*)    RDW 20.8 (*)    Platelets 539 (*)    All other components within normal limits  I-STAT TROPOININ, ED    Imaging Review Dg Chest 2 View  06/19/2015  CLINICAL DATA:  Chest pain for 2 days. EXAM: CHEST  2 VIEW COMPARISON:  03/15/2015 and 11/16/2014 radiographs. FINDINGS: There are lower lung volumes with mild central airway thickening. No edema, confluent airspace opacity, pleural effusion or pneumothorax is demonstrated. The heart size and mediastinal contours are stable. The bones appear unchanged with a right-sided cervical rib. IMPRESSION: Suboptimal inspiration with central airway thickening suggesting bronchitis. No evidence of pneumonia. Electronically Signed   By: Richardean Sale M.D.   On: 06/19/2015 15:13   I have  personally reviewed and evaluated these images and lab results as part of my medical decision-making.   EKG Interpretation None     [name], CORADO [id number] 19-Jun-2015 14:10:50 [facility]/ED ROUTINE RECORD Normal sinus rhythm Normal ECG 104mm/s 33mm/mV 100Hz  8.0 SP2 12SL 241 CID: [id number] Referred by: Unconfirmed Vent. rate 93 BPM PR interval 144 ms QRS duration 84 ms QT/QTc 350/435 ms P-R-T axes -5 81 51  MDM   Final diagnoses:  HIV DISEASE  Iron deficiency anemia due to chronic blood loss  Bronchitis    Pt is well appearing with normal vital signs. She has Findings consistent with bronchitis on chest x-ray. Her hemoglobin is 6.9 Patient's baseline appears to be about 7. She has no EKG changes. She's had a negative troponin. She has no active bleeding at this time.  The patient's last CD4 was 30. She does not appear to be compliant with her medications, I question if she is taking her Bactrim or azithromycin for  her HAART therapy at all she does not know the names of them or how she supposed to be taking them. She has had no visits to her infectious disease doctor since June 2016.  Discussed discharge versus admittance with Dr. Thomasene Lot,  We reviewed the case together. Will refer to hand for chronic paronychia. Will prescribe Azithromycin and bactrim as the patient is out of these per pharmacy tech. We believe the patient is well enough for home. She does not look sick and is hemodynamically stable.  Given first doses in the ED and referred to Dr. Tommy Medal, infectious disease.  Filed Vitals:   06/19/15 2130 06/19/15 2145  BP: 116/70 118/71  Pulse: 89 85  Temp:    Resp: 21 [address] PA-C 06/19/15 2343  Courteney Julio Alm, MD 06/20/15 0030

## 2015-06-19 NOTE — ED Notes (Signed)
Pt states "ive had chest pains for two days and I'm throwing up blood, my hemoglobin might be low". Pt hx of anemia. Denies any other symptoms.

## 2015-06-19 NOTE — Discharge Instructions (Signed)

## 2015-06-20 MED ORDER — AZITHROMYCIN 250 MG PO TABS: 250.0000 mg | ORAL_TABLET | Freq: Every day | ORAL | Status: DC

## 2015-06-20 MED ORDER — SULFAMETHOXAZOLE-TRIMETHOPRIM 200-40 MG/5ML PO SUSP: 20.0000 mL | Freq: Two times a day (BID) | ORAL | Status: DC

## 2015-07-17 ENCOUNTER — Other Ambulatory Visit: Payer: Medicaid Other

## 2015-07-31 ENCOUNTER — Ambulatory Visit: Payer: Medicaid Other | Admitting: Internal Medicine

## 2015-07-31 ENCOUNTER — Telehealth: Payer: Self-pay | Admitting: *Deleted

## 2015-07-31 NOTE — Telephone Encounter (Signed)
Left message requesting the patient call to schedule a new appt.

## 2015-07-31 NOTE — Telephone Encounter (Signed)
AIDS pt, detectable, not in care.  Left message for Shirley White, [facility] Case Manager to call [facility] to let us know what is happening with the pt.  Dr. Linus Salmons asking for possible [facility] Counselor to start with the pt to get her back in care and taking medication.

## 2015-07-31 NOTE — Telephone Encounter (Addendum)
RN spoke with Lazarus Gowda, [place] Case Manager.  Anglie now has a job.  [facility] filled out RW/ADAP paperwork in March.  Pt only has [employer].  Pt told Lattie Haw at [facility] that she would call [facility] to schedule a new appointment.

## 2015-08-08 ENCOUNTER — Other Ambulatory Visit: Payer: Medicaid Other

## 2015-08-08 ENCOUNTER — Telehealth: Payer: Self-pay | Admitting: *Deleted

## 2015-08-08 NOTE — Telephone Encounter (Signed)
Per [employer], patient picked up [facility] August -> February, then lost insurance. Per [facility], patient claims to be adherent prior to the insurance gap.   She was supposed to come in today for labs, but no-showed due to work.  She will come next week for labs. Still wait for lab results before restarting medication? ADAP approved. Landis Gandy, RN

## 2015-08-08 NOTE — Telephone Encounter (Signed)
Patient with history of noncompliance with medication and office visits, lost Medicaid and has now been ADAP approved. [facility] calling for confirmation of her Shirley White, as they have not filled this since June (her last office visit). Please advise if ok to refill.   Landis Gandy, RN

## 2015-08-08 NOTE — Telephone Encounter (Signed)
Do not refill the ODEFSEY. She needs to come in for labs. She has a high risk of failure if her viral load is high.  She may need a PI based regimen instead

## 2015-08-08 NOTE — Telephone Encounter (Signed)
Shirley White we cannot give it if the VL is above 100K which is the risk that we run into if pts stop it. We need to know her VL first

## 2015-08-09 NOTE — Telephone Encounter (Signed)
Spoke with [facility] Case Manager Lattie Haw and left message with patient's mother notifying patient that she needs to call her Dr.'s office.  Per Dr. Tommy Medal, patient must have a viral load drawn before restarting Odefsey.  Pharmacy  Notified.

## 2015-08-21 ENCOUNTER — Other Ambulatory Visit (HOSPITAL_COMMUNITY)
Admission: RE | Admit: 2015-08-21 | Discharge: 2015-08-21 | Disposition: A | Discharge status: Home / Self Care | Payer: Medicaid Other | Source: Ambulatory Visit | Attending: Internal Medicine | Admitting: Internal Medicine

## 2015-08-21 ENCOUNTER — Encounter: Payer: Self-pay | Admitting: Internal Medicine

## 2015-08-21 ENCOUNTER — Other Ambulatory Visit (INDEPENDENT_AMBULATORY_CARE_PROVIDER_SITE_OTHER): Payer: Self-pay

## 2015-08-21 DIAGNOSIS — B20 Human immunodeficiency virus [HIV] disease: Secondary | ICD-10-CM

## 2015-08-21 DIAGNOSIS — Z79899 Other long term (current) drug therapy: Secondary | ICD-10-CM

## 2015-08-21 DIAGNOSIS — Z113 Encounter for screening for infections with a predominantly sexual mode of transmission: Secondary | ICD-10-CM

## 2015-08-21 LAB — COMPLETE METABOLIC PANEL WITH GFR
ALT: 7 U/L (ref 6–29)
AST: 12 U/L (ref 10–30)
Albumin: 3.7 g/dL (ref 3.6–5.1)
Alkaline Phosphatase: 46 U/L (ref 33–115)
BUN: 5 mg/dL — AB (ref 7–25)
CALCIUM: 8.7 mg/dL (ref 8.6–10.2)
CHLORIDE: 106 mmol/L (ref 98–110)
CO2: 23 mmol/L (ref 20–31)
Creat: 0.52 mg/dL (ref 0.50–1.10)
GFR, Est Non African American: >89 mL/min (ref >=60)
GLUCOSE: 77 mg/dL (ref 65–99)
Potassium: 4.5 mmol/L (ref 3.5–5.3)
Sodium: 139 mmol/L (ref 135–146)
TOTAL PROTEIN: 7.3 g/dL (ref 6.1–8.1)
Total Bilirubin: 0.8 mg/dL (ref 0.2–1.2)

## 2015-08-21 LAB — CBC WITH DIFFERENTIAL/PLATELET
BASOS ABS: 82 {cells}/uL (ref 0–200)
Basophils Relative: 2 %
Eosinophils Absolute: 82 cells/uL (ref 15–500)
Eosinophils Relative: 2 %
HEMATOCRIT: 21 % — AB (ref 35.0–45.0)
HEMOGLOBIN: 5.7 g/dL — AB (ref 11.7–15.5)
LYMPHS ABS: 1476 {cells}/uL (ref 850–3900)
Lymphocytes Relative: 36 %
MCH: 15.2 pg — ABNORMAL LOW (ref 27.0–33.0)
MCHC: 26.2 g/dL — AB (ref 32.0–36.0)
MCV: 56 fL — ABNORMAL LOW (ref 80.0–100.0)
MONO ABS: 410 {cells}/uL (ref 200–950)
MPV: 9.1 fL (ref 7.5–12.5)
Monocytes Relative: 10 %
NEUTROS ABS: 2050 {cells}/uL (ref 1500–7800)
NEUTROS PCT: 50 %
Platelets: 479 10*3/uL — ABNORMAL HIGH (ref 140–400)
RBC: 3.75 MIL/uL — AB (ref 3.80–5.10)
RDW: 20.7 % — ABNORMAL HIGH (ref 11.0–15.0)
WBC: 4.1 10*3/uL (ref 3.8–10.8)

## 2015-08-21 LAB — LIPID PANEL
CHOLESTEROL: 143 mg/dL (ref 125–200)
HDL: 61 mg/dL (ref >=46)
LDL Cholesterol: 70 mg/dL (ref <130)
TRIGLYCERIDES: 62 mg/dL (ref <150)
Total CHOL/HDL Ratio: 2.3 Ratio (ref <=5.0)
VLDL: 12 mg/dL (ref <30)

## 2015-08-22 LAB — HIV-1 RNA ULTRAQUANT REFLEX TO GENTYP+
HIV 1 RNA QUANT: 18268 {copies}/mL — AB (ref <20)
HIV-1 RNA QUANT, LOG: 4.26 {Log_copies}/mL — AB (ref <1.30)

## 2015-08-22 LAB — URINE CYTOLOGY ANCILLARY ONLY
Chlamydia: NEGATIVE
Neisseria Gonorrhea: NEGATIVE

## 2015-08-22 LAB — RPR

## 2015-08-22 LAB — T-HELPER CELL (CD4) - (RCID CLINIC ONLY)
CD4 % Helper T Cell: 3 % — ABNORMAL LOW (ref 33–55)
CD4 T Cell Abs: 40 /uL — ABNORMAL LOW (ref 400–2700)

## 2015-08-23 ENCOUNTER — Inpatient Hospital Stay (HOSPITAL_COMMUNITY): Admission: RE | Admit: 2015-08-23 | Payer: Medicaid Other | Source: Ambulatory Visit

## 2015-08-23 ENCOUNTER — Telehealth: Payer: Self-pay | Admitting: *Deleted

## 2015-08-23 ENCOUNTER — Ambulatory Visit (HOSPITAL_COMMUNITY)
Admission: RE | Admit: 2015-08-23 | Discharge: 2015-08-23 | Disposition: A | Discharge status: Home / Self Care | Payer: Medicaid Other | Source: Ambulatory Visit | Attending: Internal Medicine | Admitting: Internal Medicine

## 2015-08-23 DIAGNOSIS — N920 Excessive and frequent menstruation with regular cycle: Secondary | ICD-10-CM

## 2015-08-23 DIAGNOSIS — D649 Anemia, unspecified: Secondary | ICD-10-CM | POA: Insufficient documentation

## 2015-08-23 DIAGNOSIS — B20 Human immunodeficiency virus [HIV] disease: Secondary | ICD-10-CM | POA: Insufficient documentation

## 2015-08-23 DIAGNOSIS — N922 Excessive menstruation at puberty: Secondary | ICD-10-CM

## 2015-08-23 DIAGNOSIS — D638 Anemia in other chronic diseases classified elsewhere: Secondary | ICD-10-CM

## 2015-08-23 LAB — ABO/RH: ABO/RH(D): B POS

## 2015-08-23 NOTE — Telephone Encounter (Signed)
Note from Dr. Linus Salmons: She has a very low Hgb. She needs a unit of blood. Should be able to do it through [facility]. It appears to be chronic and she needs GYN as well. thanks

## 2015-08-23 NOTE — Telephone Encounter (Signed)
-----   Message from Carlyle Basques, MD sent at 08/22/2015  5:41 PM EDT ----- Can we get her into [facility] for blood transfusion on thursday

## 2015-08-23 NOTE — Progress Notes (Signed)
Pt had blood drawn for a  Type and cross match for 2 units of blood for 08/24/15. Blood bracelet on and reminded patient not to remove for any reason. Pt voiced understanding.

## 2015-08-23 NOTE — Telephone Encounter (Signed)
Spoke with patient. She states she is without symptoms, is too busy to have a transfusion until next week (she is moving apartments today/tomorrow, will be working this weekend).  RN discussed with Dr. Baxter Flattery and with patient, agreed that she would have her type/cross drawn today at [facility], will come back tomorrow morning for the transfusion.  Orders faxed per Dr. Baxter Flattery to [facility], attn Gwynn. Will place referral to gynecology per Dr. Linus Salmons. Landis Gandy, RN

## 2015-08-24 ENCOUNTER — Encounter (HOSPITAL_COMMUNITY): Payer: Medicaid Other

## 2015-08-24 ENCOUNTER — Ambulatory Visit (HOSPITAL_COMMUNITY)
Admission: RE | Admit: 2015-08-24 | Discharge: 2015-08-24 | Disposition: A | Discharge status: Home / Self Care | Payer: Medicaid Other | Source: Ambulatory Visit | Attending: Internal Medicine | Admitting: Internal Medicine

## 2015-08-24 LAB — PREPARE RBC (CROSSMATCH)

## 2015-08-24 MED ORDER — SODIUM CHLORIDE 0.9 % IV SOLN
Freq: Once | INTRAVENOUS | Status: AC
  Administered 2015-08-24: 09:00:00 via INTRAVENOUS

## 2015-08-24 NOTE — Discharge Instructions (Signed)

## 2015-08-24 NOTE — Progress Notes (Signed)
Dx: Anemia of chronic disease  ICD_10_CM: D63.8  Provider: Dr. Baxter Flattery  Procedure: Pt received 2 units of PRBC  via peripheral IV.  Pt tolerated each unit well.  Post procedure: Pt alert, oriented and ambulatory at discharge.

## 2015-08-27 ENCOUNTER — Other Ambulatory Visit: Payer: Self-pay | Admitting: *Deleted

## 2015-08-27 DIAGNOSIS — B20 Human immunodeficiency virus [HIV] disease: Secondary | ICD-10-CM

## 2015-08-27 LAB — TYPE AND SCREEN
ABO/RH(D): B POS
Antibody Screen: NEGATIVE
UNIT DIVISION: 0
Unit division: 0

## 2015-08-27 MED ORDER — EMTRICITAB-RILPIVIR-TENOFOV AF 200-25-25 MG PO TABS: 1.0000 | ORAL_TABLET | Freq: Every day | ORAL | Status: DC

## 2015-08-30 LAB — HIV-1 GENOTYPR PLUS

## 2015-09-03 ENCOUNTER — Ambulatory Visit: Payer: Medicaid Other | Admitting: Internal Medicine

## 2015-11-22 ENCOUNTER — Encounter: Payer: Medicaid Other | Admitting: Family

## 2015-11-29 ENCOUNTER — Other Ambulatory Visit: Payer: Medicaid Other

## 2015-12-07 ENCOUNTER — Other Ambulatory Visit (INDEPENDENT_AMBULATORY_CARE_PROVIDER_SITE_OTHER): Payer: Self-pay

## 2015-12-07 DIAGNOSIS — D500 Iron deficiency anemia secondary to blood loss (chronic): Secondary | ICD-10-CM

## 2015-12-07 DIAGNOSIS — B20 Human immunodeficiency virus [HIV] disease: Secondary | ICD-10-CM

## 2015-12-07 LAB — T-HELPER CELL (CD4) - (RCID CLINIC ONLY)
CD4 % Helper T Cell: 2 % — ABNORMAL LOW (ref 33–55)
CD4 T CELL ABS: 30 /uL — AB (ref 400–2700)

## 2015-12-07 LAB — CBC WITH DIFFERENTIAL/PLATELET
BASOS PCT: 2 %
Basophils Absolute: 64 cells/uL (ref 0–200)
EOS PCT: 4 %
Eosinophils Absolute: 128 cells/uL (ref 15–500)
HCT: 25.4 % — ABNORMAL LOW (ref 35.0–45.0)
Hemoglobin: 7.4 g/dL — ABNORMAL LOW (ref 11.7–15.5)
Lymphocytes Relative: 42 %
Lymphs Abs: 1344 cells/uL (ref 850–3900)
MCH: 17.9 pg — AB (ref 27.0–33.0)
MCHC: 29.1 g/dL — ABNORMAL LOW (ref 32.0–36.0)
MCV: 61.5 fL — ABNORMAL LOW (ref 80.0–100.0)
MONOS PCT: 14 %
MPV: 9.5 fL (ref 7.5–12.5)
Monocytes Absolute: 448 cells/uL (ref 200–950)
NEUTROS ABS: 1216 {cells}/uL — AB (ref 1500–7800)
Neutrophils Relative %: 38 %
PLATELETS: 366 10*3/uL (ref 140–400)
RBC: 4.13 MIL/uL (ref 3.80–5.10)
RDW: 19.1 % — ABNORMAL HIGH (ref 11.0–15.0)
WBC: 3.2 10*3/uL — AB (ref 3.8–10.8)

## 2015-12-11 LAB — HIV-1 RNA QUANT-NO REFLEX-BLD
HIV 1 RNA Quant: 20410 copies/mL — ABNORMAL HIGH (ref <20)
HIV-1 RNA QUANT, LOG: 4.31 {Log_copies}/mL — AB (ref <1.30)

## 2015-12-13 ENCOUNTER — Ambulatory Visit (INDEPENDENT_AMBULATORY_CARE_PROVIDER_SITE_OTHER): Payer: Self-pay | Admitting: Internal Medicine

## 2015-12-13 ENCOUNTER — Encounter: Payer: Self-pay | Admitting: Internal Medicine

## 2015-12-13 VITALS — BP 112/73 | HR 90 | Ht 61.0 in | Wt 92.0 lb

## 2015-12-13 DIAGNOSIS — D500 Iron deficiency anemia secondary to blood loss (chronic): Secondary | ICD-10-CM

## 2015-12-13 DIAGNOSIS — B351 Tinea unguium: Secondary | ICD-10-CM | POA: Insufficient documentation

## 2015-12-13 DIAGNOSIS — B20 Human immunodeficiency virus [HIV] disease: Secondary | ICD-10-CM

## 2015-12-13 MED ORDER — EMTRICITAB-RILPIVIR-TENOFOV AF 200-25-25 MG PO TABS: 1.0000 | ORAL_TABLET | Freq: Every day | ORAL | 5 refills | Status: DC

## 2015-12-13 MED ORDER — POLYSACCHARIDE IRON COMPLEX 150 MG PO CAPS: 150.0000 mg | ORAL_CAPSULE | Freq: Every day | ORAL | 11 refills | Status: DC

## 2015-12-13 MED ORDER — TERBINAFINE HCL 1 % EX CREA: 1.0000 "application " | TOPICAL_CREAM | Freq: Two times a day (BID) | CUTANEOUS | 0 refills | Status: DC

## 2015-12-13 MED ORDER — FLUCONAZOLE 200 MG PO TABS: 200.0000 mg | ORAL_TABLET | Freq: Every day | ORAL | 0 refills | Status: DC

## 2015-12-13 NOTE — Assessment & Plan Note (Signed)
I restarted iron

## 2015-12-13 NOTE — Assessment & Plan Note (Signed)
Will try Odefsey again but she knows there is a high chance of resistance at this time.  rtc 4 weeks for labs and fu after

## 2015-12-13 NOTE — Progress Notes (Signed)
CC: Follow up for HIV  Interval history: has not been seen since June 2016.  At that time, her HIV was not controlled and she was to start [facility], having previously been on Complera.  She took for several months and stopped and did not return.  She has labs last August, May and August 2017 all with poor CD4 of 30 and baseline viral load level.  Also with persistently low Hgb and does not take iron.  She also does not like the liquid azithromycin or other big pills.    Prior to Admission medications   Medication Sig Start Date End Date Taking? Authorizing Provider  azithromycin (ZITHROMAX) 200 MG/5ML suspension Take 30 mLs (1,200 mg total) by mouth once a week. Take every Wednesday. Patient not taking: Reported on 12/13/2015 09/26/14   Michel Bickers, MD  emtricitabine-rilpivir-tenofovir AF (ODEFSEY) 200-25-25 MG TABS tablet Take 1 tablet by mouth daily with supper. Patient not taking: Reported on 12/13/2015 08/27/15   Truman Hayward, MD  iron polysaccharides (POLY-IRON 150) 150 MG capsule Take 1 capsule (150 mg total) by mouth daily. Patient not taking: Reported on 12/13/2015 09/26/14   Michel Bickers, MD  sulfamethoxazole-trimethoprim (BACTRIM,SEPTRA) 200-40 MG/5ML suspension Take 20 mLs by mouth 2 (two) times daily. Patient not taking: Reported on 12/13/2015 06/20/15   Delos Haring, PA-C    Review of Systems Constitutional: negative for fatigue and malaise Gastrointestinal: negative for diarrhea Musculoskeletal: negative for myalgias and arthralgias All other systems reviewed and are negative   Physical Exam: CONSTITUTIONAL:in no apparent distress and alert  Vitals:   12/13/15 1122  BP: 112/73  Pulse: 90   Eyes: anicteric HENT: no thrush, no cervical lymphadenopathy Respiratory: Normal respiratory effort; CTA B Cardiovascular: RRR  Lab Results  Component Value Date   HIV1RNAQUANT 20,410 (H) 12/07/2015   HIV1RNAQUANT 18,268 (H) 08/21/2015   HIV1RNAQUANT 12,080 (H) 11/14/2014    SHx: no drug use

## 2015-12-13 NOTE — Assessment & Plan Note (Signed)
I will try fluconazole and topical therapy.  Does not appear acutely infected but clearly a deformity.

## 2016-01-10 ENCOUNTER — Other Ambulatory Visit: Payer: Self-pay

## 2016-01-24 ENCOUNTER — Ambulatory Visit: Payer: Self-pay | Admitting: Internal Medicine

## 2016-02-02 ENCOUNTER — Encounter (HOSPITAL_COMMUNITY): Payer: Self-pay | Admitting: *Deleted

## 2016-02-02 ENCOUNTER — Inpatient Hospital Stay (HOSPITAL_COMMUNITY)
Admission: EM | Admit: 2016-02-02 | Discharge: 2016-02-05 | DRG: 976 | Disposition: A | Discharge status: Home / Self Care | Payer: Self-pay | Attending: Internal Medicine | Admitting: Internal Medicine

## 2016-02-02 ENCOUNTER — Emergency Department (HOSPITAL_COMMUNITY): Payer: Self-pay

## 2016-02-02 DIAGNOSIS — R0789 Other chest pain: Secondary | ICD-10-CM

## 2016-02-02 DIAGNOSIS — N920 Excessive and frequent menstruation with regular cycle: Secondary | ICD-10-CM | POA: Diagnosis present

## 2016-02-02 DIAGNOSIS — D500 Iron deficiency anemia secondary to blood loss (chronic): Secondary | ICD-10-CM | POA: Diagnosis present

## 2016-02-02 DIAGNOSIS — R079 Chest pain, unspecified: Secondary | ICD-10-CM | POA: Diagnosis present

## 2016-02-02 DIAGNOSIS — B999 Unspecified infectious disease: Secondary | ICD-10-CM | POA: Diagnosis present

## 2016-02-02 DIAGNOSIS — Z79899 Other long term (current) drug therapy: Secondary | ICD-10-CM

## 2016-02-02 DIAGNOSIS — R072 Precordial pain: Secondary | ICD-10-CM | POA: Diagnosis present

## 2016-02-02 DIAGNOSIS — B20 Human immunodeficiency virus [HIV] disease: Secondary | ICD-10-CM | POA: Diagnosis present

## 2016-02-02 DIAGNOSIS — R05 Cough: Secondary | ICD-10-CM

## 2016-02-02 DIAGNOSIS — E876 Hypokalemia: Secondary | ICD-10-CM | POA: Diagnosis present

## 2016-02-02 DIAGNOSIS — Z8249 Family history of ischemic heart disease and other diseases of the circulatory system: Secondary | ICD-10-CM

## 2016-02-02 DIAGNOSIS — B3781 Candidal esophagitis: Principal | ICD-10-CM | POA: Diagnosis present

## 2016-02-02 DIAGNOSIS — Z87891 Personal history of nicotine dependence: Secondary | ICD-10-CM

## 2016-02-02 DIAGNOSIS — K210 Gastro-esophageal reflux disease with esophagitis: Secondary | ICD-10-CM | POA: Diagnosis present

## 2016-02-02 DIAGNOSIS — D573 Sickle-cell trait: Secondary | ICD-10-CM | POA: Diagnosis present

## 2016-02-02 DIAGNOSIS — R112 Nausea with vomiting, unspecified: Secondary | ICD-10-CM

## 2016-02-02 DIAGNOSIS — D8481 Immunodeficiency due to conditions classified elsewhere: Secondary | ICD-10-CM

## 2016-02-02 DIAGNOSIS — Z9114 Patient's other noncompliance with medication regimen: Secondary | ICD-10-CM

## 2016-02-02 DIAGNOSIS — R059 Cough, unspecified: Secondary | ICD-10-CM

## 2016-02-02 DIAGNOSIS — Z808 Family history of malignant neoplasm of other organs or systems: Secondary | ICD-10-CM

## 2016-02-02 LAB — BASIC METABOLIC PANEL
ANION GAP: 8 (ref 5–15)
BUN: <5 mg/dL — ABNORMAL LOW (ref 6–20)
CALCIUM: 8.8 mg/dL — AB (ref 8.9–10.3)
CHLORIDE: 102 mmol/L (ref 101–111)
CO2: 24 mmol/L (ref 22–32)
Creatinine, Ser: 0.48 mg/dL (ref 0.44–1.00)
GFR calc non Af Amer: >60 mL/min (ref >60)
GLUCOSE: 101 mg/dL — AB (ref 65–99)
POTASSIUM: 3.3 mmol/L — AB (ref 3.5–5.1)
Sodium: 134 mmol/L — ABNORMAL LOW (ref 135–145)

## 2016-02-02 LAB — INFLUENZA PANEL BY PCR (TYPE A & B)
H1N1FLUPCR: NOT DETECTED
INFLBPCR: NEGATIVE
Influenza A By PCR: NEGATIVE

## 2016-02-02 LAB — CBC
HEMATOCRIT: 24.8 % — AB (ref 36.0–46.0)
HEMOGLOBIN: 7.1 g/dL — AB (ref 12.0–15.0)
MCH: 16.4 pg — AB (ref 26.0–34.0)
MCHC: 28.6 g/dL — AB (ref 30.0–36.0)
MCV: 57.1 fL — AB (ref 78.0–100.0)
Platelets: 484 10*3/uL — ABNORMAL HIGH (ref 150–400)
RBC: 4.34 MIL/uL (ref 3.87–5.11)
RDW: 19.3 % — ABNORMAL HIGH (ref 11.5–15.5)
WBC: 6.6 10*3/uL (ref 4.0–10.5)

## 2016-02-02 LAB — I-STAT TROPONIN, ED: TROPONIN I, POC: 0 ng/mL (ref 0.00–0.08)

## 2016-02-02 MED ORDER — SODIUM CHLORIDE 0.9% FLUSH
3.0000 mL | Freq: Two times a day (BID) | INTRAVENOUS | Status: DC
  Administered 2016-02-02 – 2016-02-05 (×3): 3 mL via INTRAVENOUS

## 2016-02-02 MED ORDER — POLYSACCHARIDE IRON COMPLEX 150 MG PO CAPS: 150.0000 mg | ORAL_CAPSULE | Freq: Every day | ORAL | Status: DC

## 2016-02-02 MED ORDER — GI COCKTAIL ~~LOC~~
30.0000 mL | Freq: Three times a day (TID) | ORAL | Status: DC | PRN
  Filled 2016-02-02: qty 30

## 2016-02-02 MED ORDER — ONDANSETRON HCL 4 MG/2ML IJ SOLN
4.0000 mg | Freq: Once | INTRAMUSCULAR | Status: AC
  Administered 2016-02-02: 4 mg via INTRAVENOUS
  Filled 2016-02-02: qty 2

## 2016-02-02 MED ORDER — ENOXAPARIN SODIUM 40 MG/0.4ML ~~LOC~~ SOLN
40.0000 mg | Freq: Every day | SUBCUTANEOUS | Status: DC
  Administered 2016-02-02: 40 mg via SUBCUTANEOUS
  Filled 2016-02-02: qty 0.4

## 2016-02-02 MED ORDER — POTASSIUM CHLORIDE CRYS ER 20 MEQ PO TBCR
40.0000 meq | EXTENDED_RELEASE_TABLET | Freq: Once | ORAL | Status: AC
  Administered 2016-02-02: 40 meq via ORAL
  Filled 2016-02-02: qty 2

## 2016-02-02 MED ORDER — SULFAMETHOXAZOLE-TRIMETHOPRIM 200-40 MG/5ML PO SUSP
20.0000 mL | Freq: Two times a day (BID) | ORAL | Status: DC
  Administered 2016-02-04 (×2): 20 mL via ORAL
  Filled 2016-02-02 (×6): qty 20

## 2016-02-02 MED ORDER — ALUM & MAG HYDROXIDE-SIMETH 200-200-20 MG/5ML PO SUSP
15.0000 mL | Freq: Once | ORAL | Status: AC
  Administered 2016-02-02: 15 mL via ORAL
  Filled 2016-02-02: qty 30

## 2016-02-02 MED ORDER — EMTRICITAB-RILPIVIR-TENOFOV AF 200-25-25 MG PO TABS
1.0000 | ORAL_TABLET | Freq: Every day | ORAL | Status: DC
  Administered 2016-02-04: 1 via ORAL
  Filled 2016-02-02 (×3): qty 1

## 2016-02-02 MED ORDER — SODIUM CHLORIDE 0.9 % IV BOLUS (SEPSIS)
1000.0000 mL | Freq: Once | INTRAVENOUS | Status: AC
Start: 2016-02-02 — End: 2016-02-02
  Administered 2016-02-02: 1000 mL via INTRAVENOUS

## 2016-02-02 MED ORDER — ONDANSETRON HCL 4 MG/2ML IJ SOLN
4.0000 mg | Freq: Four times a day (QID) | INTRAMUSCULAR | Status: DC | PRN
  Administered 2016-02-05: 4 mg via INTRAVENOUS
  Filled 2016-02-02 (×2): qty 2

## 2016-02-02 MED ORDER — ONDANSETRON HCL 4 MG PO TABS: 4.0000 mg | ORAL_TABLET | Freq: Four times a day (QID) | ORAL | Status: DC | PRN

## 2016-02-02 MED ORDER — LIDOCAINE VISCOUS 2 % MT SOLN
15.0000 mL | Freq: Once | OROMUCOSAL | Status: AC
  Administered 2016-02-02: 15 mL via OROMUCOSAL
  Filled 2016-02-02: qty 15

## 2016-02-02 MED ORDER — AZITHROMYCIN 600 MG PO TABS
1200.0000 mg | ORAL_TABLET | ORAL | Status: DC
  Administered 2016-02-03: 1200 mg via ORAL
  Filled 2016-02-02: qty 2

## 2016-02-02 NOTE — ED Triage Notes (Signed)
Pt reports chest pains for several days. Occurs more when breathing and coughing. Has productive cough with yellow sputum. No resp distress noted, ekg done at triage.

## 2016-02-02 NOTE — ED Provider Notes (Signed)
[facility] DEPT Provider Note   CSN: [id number] Arrival date & time: 02/02/16  1557     History   Chief Complaint Chief Complaint  Patient presents with  . Cough  . Chest Pain    HPI Shirley White Start is a 45 y.o. female.  Patient presents with four days of sharp chest pain worsens when she coughs or eats, vomiting, cough productive of mucous, and generalized malaise. History of HIV with last CD4 count of 30. Reports she does take her HAART therapy and on review of records is supposed to be taking Bactrim and Azithromycin for prophylaxis but tells me she has not been taking this. No family history of early MI, no personal history of heart disease.   The history is provided by the patient. No language interpreter was used.  Chest Pain   This is a new problem. The current episode started more than 2 days ago (four days ago). The problem has been gradually worsening. The pain is associated with eating and coughing. The pain is present in the substernal region. The pain is at a severity of 8/10. The pain is moderate. The quality of the pain is described as sharp. The pain does not radiate. Duration of episode(s) is 4 days. Associated symptoms include cough, malaise/fatigue, nausea, sputum production, vomiting and weakness. Pertinent negatives include no abdominal pain, no back pain, no fever, no headaches, no hemoptysis, no lower extremity edema and no shortness of breath. She has tried antacids (ibuprofen) for the symptoms. The treatment provided no relief.  Pertinent negatives for past medical history include no CAD, no diabetes, no DVT, no hyperlipidemia, no hypertension, no MI and no sickle cell disease. Past medical history comments: HIV/AIDS  Her family medical history is significant for hypertension.  Procedure history is negative for cardiac catheterization, echocardiogram, stress echo and exercise treadmill test.    Past Medical History:  Diagnosis Date  . Anemia   . HIV  disease [facility])     Patient Active Problem List   Diagnosis Date Noted  . Odynophagia 02/02/2016  . Chest pain 02/02/2016  . AIDS ([place]) 02/02/2016  . Onychomycosis 12/13/2015  . Iron deficiency anemia due to chronic blood loss   . H/O menorrhagia 09/05/2014  . HIV DISEASE 06/04/2006  . Sickle-cell trait ([place]) 06/04/2006    Past Surgical History:  Procedure Laterality Date  . CESAREAN SECTION      OB History    Gravida Para Term Preterm AB Living   4 4 4          SAB TAB Ectopic Multiple Live Births                   Home Medications    Prior to Admission medications   Medication Sig Start Date End Date Taking? Authorizing Provider  emtricitabine-rilpivir-tenofovir AF (ODEFSEY) 200-25-25 MG TABS tablet Take 1 tablet by mouth daily with supper. 12/13/15  Yes Thayer Headings, MD  iron polysaccharides (POLY-IRON 150) 150 MG capsule Take 1 capsule (150 mg total) by mouth daily. 12/13/15  Yes Thayer Headings, MD  fluconazole (DIFLUCAN) 200 MG tablet Take 1 tablet (200 mg total) by mouth daily. Patient not taking: Reported on 02/02/2016 12/13/15   Thayer Headings, MD  terbinafine (LAMISIL) 1 % cream Apply 1 application topically 2 (two) times daily. Patient not taking: Reported on 02/02/2016 12/13/15   Thayer Headings, MD    Family History Family History  Problem Relation Age of Onset  . Hyperlipidemia Mother   .  Cancer Father     Social History Social History  Substance Use Topics  . Smoking status: Never Smoker  . Smokeless tobacco: Never Used  . Alcohol use 0.6 oz/week    1 Cans of beer per week     Comment: occa     Allergies   Review of patient's allergies indicates no known allergies.   Review of Systems Review of Systems  Constitutional: Positive for malaise/fatigue. Negative for fever.  HENT: Negative.   Respiratory: Positive for cough and sputum production. Negative for hemoptysis and shortness of breath.   Cardiovascular: Positive for chest pain.    Gastrointestinal: Positive for nausea and vomiting. Negative for abdominal pain and diarrhea.  Genitourinary: Negative for dysuria.  Musculoskeletal: Negative for back pain.  Skin: Negative.   Allergic/Immunologic: Positive for immunocompromised state.  Neurological: Positive for weakness. Negative for headaches.  Hematological: Does not bruise/bleed easily.  Psychiatric/Behavioral: Negative.      Physical Exam Updated Vital Signs BP (!) 178/81 (BP Location: Right Arm)   Pulse 72   Temp 98.6 F (37 C) (Oral)   Resp 17   Ht 5' (1.524 m)   Wt 42.6 kg   SpO2 100%   BMI 18.34 kg/m   Physical Exam  Constitutional: She is oriented to person, place, and time. She appears well-developed and well-nourished. No distress.  HENT:  Head: Normocephalic and atraumatic.  Eyes: Conjunctivae and EOM are normal. No scleral icterus.  Neck: Normal range of motion. Neck supple.  Cardiovascular: Normal rate, regular rhythm, normal heart sounds and intact distal pulses.  Exam reveals no gallop and no friction rub.   No murmur heard. Pulmonary/Chest: Effort normal and breath sounds normal. No respiratory distress. She has no wheezes. She has no rales. She exhibits no tenderness.  Abdominal: Soft. She exhibits no distension and no mass. There is no tenderness. There is no guarding.  Musculoskeletal: She exhibits no edema.  Neurological: She is alert and oriented to person, place, and time.  Skin: Skin is warm and dry. Capillary refill takes less than 2 seconds. No rash noted. She is not diaphoretic. No pallor.  Psychiatric: She has a normal mood and affect. Her behavior is normal. Judgment and thought content normal.     ED Treatments / Results  Labs (all labs ordered are listed, but only abnormal results are displayed) Labs Reviewed  BASIC METABOLIC PANEL - Abnormal; Notable for the following:       Result Value   Sodium 134 (*)    Potassium 3.3 (*)    Glucose, Bld 101 (*)    BUN <5 (*)     Calcium 8.8 (*)    All other components within normal limits  CBC - Abnormal; Notable for the following:    Hemoglobin 7.1 (*)    HCT 24.8 (*)    MCV 57.1 (*)    MCH 16.4 (*)    MCHC 28.6 (*)    RDW 19.3 (*)    Platelets 484 (*)    All other components within normal limits  INFLUENZA PANEL BY PCR (TYPE A & B, 99991111)  BASIC METABOLIC PANEL  CBC  FERRITIN  T-HELPER CELLS (CD4) COUNT (NOT AT [facility])  HIV-1 RNA ULTRAQUANT REFLEX TO GENTYP+  I-STAT TROPOININ, ED    EKG  EKG Interpretation  Date/Time:  Saturday February 02 2016 16:03:09 EDT Ventricular Rate:  95 PR Interval:  142 QRS Duration: 96 QT Interval:  336 QTC Calculation: 422 R Axis:   112 Text Interpretation:  Normal sinus rhythm Possible Left atrial enlargement Right axis deviation poor baseline Confirmed by ZAVITZ MD, JOSHUA [phone]) on 02/02/2016 5:46:33 PM       Radiology Dg Chest 2 View  Result Date: 02/02/2016 CLINICAL DATA:  Chest pain and cough EXAM: CHEST  2 VIEW COMPARISON:  06/19/2015 chest radiograph. FINDINGS: Stable cardiomediastinal silhouette with normal heart size. No pneumothorax. No pleural effusion. Lungs appear clear, with no acute consolidative airspace disease and no pulmonary edema. Stable mild eventration of the left hemidiaphragm. IMPRESSION: No active cardiopulmonary disease. Electronically Signed   By: Ilona Sorrel M.D.   On: 02/02/2016 17:04    Procedures Procedures (including critical care time)  Medications Ordered in ED Medications  emtricitabine-rilpivir-tenofovir AF (ODEFSEY) 200-25-25 MG per tablet 1 tablet (not administered)  iron polysaccharides (NIFEREX) capsule 150 mg (not administered)  enoxaparin (LOVENOX) injection 40 mg (40 mg Subcutaneous Given 02/02/16 2243)  sodium chloride flush (NS) 0.9 % injection 3 mL (3 mLs Intravenous Given 02/02/16 2230)  ondansetron (ZOFRAN) tablet 4 mg (not administered)    Or  ondansetron (ZOFRAN) injection 4 mg (not administered)  gi  cocktail (Maalox,Lidocaine,Donnatal) (not administered)  azithromycin (ZITHROMAX) tablet 1,200 mg (not administered)  sulfamethoxazole-trimethoprim (BACTRIM,SEPTRA) 200-40 MG/5ML suspension 20 mL (not administered)  alum & mag hydroxide-simeth (MAALOX/MYLANTA) 200-200-20 MG/5ML suspension 15 mL (15 mLs Oral Given 02/02/16 1854)  lidocaine (XYLOCAINE) 2 % viscous mouth solution 15 mL (15 mLs Mouth/Throat Given 02/02/16 1854)  sodium chloride 0.9 % bolus 1,000 mL (0 mLs Intravenous Stopped 02/02/16 2029)  ondansetron (ZOFRAN) injection 4 mg (4 mg Intravenous Given 02/02/16 1931)  potassium chloride SA (K-DUR,KLOR-CON) CR tablet 40 mEq (40 mEq Oral Given 02/02/16 2243)     Initial Impression / Assessment and Plan / ED Course  I have reviewed the triage vital signs and the nursing notes.  Pertinent labs & imaging results that were available during my care of the patient were reviewed by me and considered in my medical decision making (see chart for details).  Clinical Course    Patient presents with several days of multiple symptoms including vomiting, poor appetite, cough productive of sputum, and substernal chest pain worsened with eating and coughing. She has a history of AIDS with a year of low CD4 counts in the 30s-40s range. Reports compliant with HAART therapy but does not take prophylaxis. She is overall well-appearing, alert and oriented. She is not hypoxic and has clear lungs sounds. CXR without signs of pneumonia or PCP. She is mildly tachycardic and had difficulty tolerating Po. Vitals otherwise unremarkable. No abdominal tenderness so I do not suspect intra-abdominal infection as the cause of her symptoms. These are more consistent with a viral illness. Flu PCR ordered. Labs reveal stable anemia with no indication of acute blood loss. Discussed with ID physician on-call who recommended observation overnight versus possible discharge depending on patient preference, as she is high risk  given her immunocompromised state and noncompliance with prophylaxis. As she is having difficulty tolerating PO I had a shared decision making conversation with the patient who prefers admission. Feel this is reasonable given her high risk. PCP pneumonia may not result on CXR for 24 hours so have discussed with medicine about observation for symptoms. She is stable and appropriate for floor admission.  Final Clinical Impressions(s) / ED Diagnoses   Final diagnoses:  Cough  Atypical chest pain  Intractable vomiting with nausea, unspecified vomiting type  Immunocompromised status associated with infection [facility])    New Prescriptions Current Discharge Medication  List       Harlin Heys, MD 02/02/16 [id number]    Elnora Morrison, MD 02/02/16 [phone]

## 2016-02-02 NOTE — H&P (Signed)
Date: 02/03/2016               Patient Name:  Shirley White MRN: [id number]  DOB: February 28, 1971 Age / Sex: 45 y.o., female   PCP: No Pcp Per Patient         Medical Service: Internal Medicine Teaching Service         Attending Physician: Dr. Aldine Contes, MD    First Contact: Dr. Lovena Le Pager: [phone]  Second Contact: Dr. Charlynn Grimes Pager: [phone]       After Hours (After 5p/  First Contact Pager: [phone]  weekends / holidays): Second Contact Pager: [phone]   Chief Complaint: chest pain  History of Present Illness: Ms. Hyson is a 45 yo female with PMHx of HIV/AIDs, iron deficiency anemia, and sickle cell trait who presents with chest pain and odynophagia.   Patient states that 1.5 weeks ago she developed sharp, non-radiating substernal chest pain that is associated with swallowing, coughing and with inspiration. She originally attributed the pain to her acid reflux as she has recently been eating spicy foods. She tried to take advil without relief. She admits to odynophagia, but denies dysphagia. Her food regurgitates about 5 minutes after eating and is associated with nausea. She is able to keep some of the food down as only part of the meal with come up with vomiting. The nausea resolves after vomiting. She denies trouble keeping down liquids or feeling that food is getting stuck in her throat. The pain persists after vomiting but improves. She admits to a similar problem 2 years ago with odynophagia without dysphagia and underwent EGD at [facility] which was reportedly normal. Patient denies any lesions or white material in her mouth. She decided to come in today as her symptoms were persistent and she overall did not feel well.   Patient admits to a one week history of productive cough of yellow sputum with mild shortness of breath. She denies fever, chills, nasal congestion, headache, vision changes, allergies, diarrhea, constipation, numbness or weakness.   Patient  follows with infectious disease for her HIV. She admits to compliance with her HIV meds, but states she wasn't prescribed any antibiotics. She was last seen in clinic one month ago. She has chronically uncontrolled HIV/AIDS with her last CD4 count at 30.   Meds: Current Facility-Administered Medications  Medication Dose Route Frequency Provider Last Rate Last Dose  . azithromycin (ZITHROMAX) tablet 1,200 mg  1,200 mg Oral Weekly Alexa Angela Burke, MD      . emtricitabine-rilpivir-tenofovir AF (ODEFSEY) 200-25-25 MG per tablet 1 tablet  1 tablet Oral Q supper Alexa R Burns, MD      . enoxaparin (LOVENOX) injection 40 mg  40 mg Subcutaneous QHS Alexa R Burns, MD   40 mg at 02/02/16 2243  . gi cocktail (Maalox,Lidocaine,Donnatal)  30 mL Oral TID PRN Alexa Angela Burke, MD      . iron polysaccharides (NIFEREX) capsule 150 mg  150 mg Oral Daily Alexa R Burns, MD      . ondansetron (ZOFRAN) tablet 4 mg  4 mg Oral Q6H PRN Alexa Angela Burke, MD       Or  . ondansetron (ZOFRAN) injection 4 mg  4 mg Intravenous Q6H PRN Alexa R Burns, MD      . sodium chloride flush (NS) 0.9 % injection 3 mL  3 mL Intravenous Q12H Alexa R Burns, MD   3 mL at 02/02/16 2230  . sulfamethoxazole-trimethoprim (BACTRIM,SEPTRA) 200-40 MG/5ML suspension 20  mL  20 mL Oral Q12H Alexa Angela Burke, MD        Allergies: Allergies as of 02/02/2016  . (No Known Allergies)   Past Medical History:  Diagnosis Date  . Anemia   . HIV disease ([place])     Family History:  Aunt: Cancer Mother: HTN Father: Throat cancer  Social History:  Tobacco Use: Denies (previous- quit 20-30 years ago) Alcohol Use: Denies Illicit Drug Use: Remote history of marijuana  Review of Systems: A complete ROS was negative except as per HPI.   Physical Exam: Vitals:   02/02/16 2034 02/02/16 2036 02/02/16 2036 02/02/16 2045  BP: 133/75   (!) 178/81  Pulse:  78  72  Resp:    17  Temp:   98.3 F (36.8 C) 98.6 F (37 C)  TempSrc:   Oral Oral  SpO2:  100%  100%    Weight:    93 lb 14.4 oz (42.6 kg)  Height:    5' (1.524 m)   General: Vital signs reviewed.  Patient is thin appearing, in no acute distress and cooperative with exam.  Head: Normocephalic and atraumatic. Eyes: PERRL, conjunctivae normal, no scleral icterus.  Neck: Supple, trachea midline, no anterior cervical or supraclavicular lymphadenopathy.  Mouth: Normal posterior oropharynx without evidence of lesions.  Cardiovascular: RRR, 2/6 systolic murmur. Pain reproducible on palpation of chest wall.  Pulmonary/Chest: Clear to auscultation bilaterally, no wheezes, rales, or rhonchi. Abdominal: Soft, non-tender, non-distended, BS + Extremities: No lower extremity edema bilaterally, pulses symmetric and intact bilaterally.  Skin: Warm, dry and intact.  Psychiatric: Normal mood and affect. Speech and behavior is normal. Cognition and memory are normal.   EKG: Sinus rhythm, progression of TWI in lead III, flattened T waves in V4-V6. Similar to prior from March   CXR: No acute cardiopulmonary disease  Assessment & Plan by Problem: Principal Problem:   Odynophagia Active Problems:   Sickle-cell trait (HCC)   Iron deficiency anemia due to chronic blood loss   Chest pain   AIDS [facility])  Ms. Kamrowski is a 45 yo female with PMHx of HIV/AIDs, iron deficiency anemia, and sickle cell trait who presents with chest pain and odynophagia.  Chest Pain with Odynophagia: Patient presents with a 1.5 weeks history of odynophagia and substernal, non-radiating chest pain associated with swallowing and occasional cough. Patient is afebrile, satting well on room air, no leukocytosis. Troponin negative and EKG similar to prior without acute ischemic changes. Pain seems GI related rather than cardiac. Given her uncontrolled HIV/AIDS, CMV, HSV or Fungal infection of the esophagus remains high on the differential. Would obtain a GI consult in the morning for possible evaluation with EGD. ED was initially concerned for  PCP given low CD4 count and mild cough; however, CXR was normal, lungs are clear. This was discussed with ID and it could be an early presentation of PCP.  -NPO midnight for possible EGD -GI cocktail prn  -Repeat EKG tomorrow am -Obtain GI Consult in the morning  -Consider further discussion with ID  -Consider repeat CXR tomorrow to r/o early PCP  HIV/AIDS: Uncontrolled since May 2016. Patient follows with Dr. Linus Salmons. Last CD4 count as 30 in August 2017 and HIV viral load >20,000. Patient has not been taking Azithromycin or Bactrim, but has been taking her daily Odefsey.  -Continue Odefsey -Restart Bactrim 20 mL BID -Restart Azithromycin Qweek -Recheck CD4 and HIV viral load  Iron Deficiency Anemia: Hgb 7.1, MCV 57.1 on admission. Hgb baseline has been 6-7  over the last two years. Ferritin 2 in May 2016. Chronic iron deficiency anemia has been attributed to chronic menorrhagia.  -Continue iron supplements -Trend CBC, transfuse Hgb <7 -Check ferritin -Would consider IV iron transfusion during admission as patient cannot afford home iron supplementation  Hypokalemia: Potassium 3.3. Likely 2/2 recent vomiting. -KDur 40 mEq once -Repeat BMET tomorrow am  DVT/PE ppx: Lovenox SQ QD CODE: Full FEN: Regular  Dispo: Admit patient to Observation with expected length of stay less than 2 midnights.  Signed: Alphonzo Grieve, MD IMTS - PGY1 Pager [phone]

## 2016-02-03 ENCOUNTER — Encounter (HOSPITAL_COMMUNITY): Payer: Self-pay

## 2016-02-03 ENCOUNTER — Encounter (HOSPITAL_COMMUNITY)
Admission: EM | Disposition: A | Discharge status: Home / Self Care | Payer: Self-pay | Source: Home / Self Care | Attending: Internal Medicine

## 2016-02-03 DIAGNOSIS — R1310 Dysphagia, unspecified: Secondary | ICD-10-CM

## 2016-02-03 DIAGNOSIS — B999 Unspecified infectious disease: Secondary | ICD-10-CM | POA: Diagnosis present

## 2016-02-03 DIAGNOSIS — B20 Human immunodeficiency virus [HIV] disease: Secondary | ICD-10-CM

## 2016-02-03 HISTORY — PX: ESOPHAGOGASTRODUODENOSCOPY: SHX5428

## 2016-02-03 LAB — DIFFERENTIAL
Basophils Absolute: 0.1 10*3/uL (ref 0.0–0.1)
Basophils Relative: 1 %
EOS ABS: 0.1 10*3/uL (ref 0.0–0.7)
EOS PCT: 1 %
LYMPHS ABS: 1.3 10*3/uL (ref 0.7–4.0)
Lymphocytes Relative: 30 %
Monocytes Absolute: 0.6 10*3/uL (ref 0.1–1.0)
Monocytes Relative: 14 %
NEUTROS PCT: 54 %
Neutro Abs: 2.3 10*3/uL (ref 1.7–7.7)

## 2016-02-03 LAB — BASIC METABOLIC PANEL
Anion gap: 8 (ref 5–15)
BUN: <5 mg/dL — ABNORMAL LOW (ref 6–20)
CALCIUM: 8.3 mg/dL — AB (ref 8.9–10.3)
CO2: 25 mmol/L (ref 22–32)
CREATININE: 0.48 mg/dL (ref 0.44–1.00)
Chloride: 107 mmol/L (ref 101–111)
Glucose, Bld: 89 mg/dL (ref 65–99)
Potassium: 3.2 mmol/L — ABNORMAL LOW (ref 3.5–5.1)
Sodium: 140 mmol/L (ref 135–145)

## 2016-02-03 LAB — CBC
HCT: 26 % — ABNORMAL LOW (ref 36.0–46.0)
Hemoglobin: 7.2 g/dL — ABNORMAL LOW (ref 12.0–15.0)
MCH: 15.9 pg — AB (ref 26.0–34.0)
MCHC: 27.7 g/dL — AB (ref 30.0–36.0)
MCV: 57.3 fL — ABNORMAL LOW (ref 78.0–100.0)
PLATELETS: 434 10*3/uL — AB (ref 150–400)
RBC: 4.54 MIL/uL (ref 3.87–5.11)
RDW: 19.3 % — ABNORMAL HIGH (ref 11.5–15.5)
WBC: 4 10*3/uL (ref 4.0–10.5)

## 2016-02-03 LAB — PROTIME-INR
INR: 1.14
PROTHROMBIN TIME: 14.7 s (ref 11.4–15.2)

## 2016-02-03 LAB — FERRITIN: FERRITIN: 3 ng/mL — AB (ref 11–307)

## 2016-02-03 SURGERY — EGD (ESOPHAGOGASTRODUODENOSCOPY)
Anesthesia: Moderate Sedation

## 2016-02-03 MED ORDER — DIPHENHYDRAMINE HCL 50 MG/ML IJ SOLN
INTRAMUSCULAR | Status: DC | PRN
  Administered 2016-02-03 (×2): 25 mg via INTRAVENOUS

## 2016-02-03 MED ORDER — SODIUM CHLORIDE 0.9 % IV SOLN
1000.0000 mg | Freq: Once | INTRAVENOUS | Status: AC
  Administered 2016-02-03: 1000 mg via INTRAVENOUS
  Filled 2016-02-03 (×2): qty 20

## 2016-02-03 MED ORDER — FLUCONAZOLE 200 MG PO TABS
400.0000 mg | ORAL_TABLET | Freq: Every day | ORAL | Status: DC
  Filled 2016-02-03: qty 4
  Filled 2016-02-03 (×2): qty 2

## 2016-02-03 MED ORDER — SODIUM CHLORIDE 0.9 % IV SOLN
25.0000 mg | Freq: Once | INTRAVENOUS | Status: AC
  Administered 2016-02-03: 25 mg via INTRAVENOUS
  Filled 2016-02-03: qty 0.5

## 2016-02-03 MED ORDER — POTASSIUM CHLORIDE 20 MEQ/15ML (10%) PO SOLN
40.0000 meq | Freq: Every day | ORAL | Status: DC
  Administered 2016-02-03 – 2016-02-04 (×2): 40 meq via ORAL
  Filled 2016-02-03 (×3): qty 30

## 2016-02-03 MED ORDER — SODIUM CHLORIDE 0.9 % IV SOLN
INTRAVENOUS | Status: AC
  Administered 2016-02-03 (×2): via INTRAVENOUS

## 2016-02-03 MED ORDER — ENOXAPARIN SODIUM 30 MG/0.3ML ~~LOC~~ SOLN
30.0000 mg | Freq: Every day | SUBCUTANEOUS | Status: DC
  Administered 2016-02-04: 30 mg via SUBCUTANEOUS
  Filled 2016-02-03 (×2): qty 0.3

## 2016-02-03 MED ORDER — MIDAZOLAM HCL 5 MG/ML IJ SOLN
INTRAMUSCULAR | Status: AC
  Filled 2016-02-03: qty 2

## 2016-02-03 MED ORDER — FENTANYL CITRATE (PF) 100 MCG/2ML IJ SOLN
INTRAMUSCULAR | Status: DC | PRN
  Administered 2016-02-03 (×3): 25 ug via INTRAVENOUS

## 2016-02-03 MED ORDER — DIPHENHYDRAMINE HCL 50 MG/ML IJ SOLN
INTRAMUSCULAR | Status: AC
  Filled 2016-02-03: qty 1

## 2016-02-03 MED ORDER — MIDAZOLAM HCL 10 MG/2ML IJ SOLN
INTRAMUSCULAR | Status: DC | PRN
  Administered 2016-02-03 (×2): 2 mg via INTRAVENOUS
  Administered 2016-02-03: 1 mg via INTRAVENOUS
  Administered 2016-02-03: 2 mg via INTRAVENOUS

## 2016-02-03 MED ORDER — FENTANYL CITRATE (PF) 100 MCG/2ML IJ SOLN
INTRAMUSCULAR | Status: AC
  Filled 2016-02-03: qty 4

## 2016-02-03 NOTE — Progress Notes (Signed)
   Subjective: No acute events overnight. Patient continues to have midsternal pain which is worse with swallowing. She also endorses a few day history where solid food will get stuck when she swallows. The patient states that this has never happened before. She denied headaches, nausea, vomiting or abdominal pain. She denied diarrhea or constipation. She denied fevers or chills. She did endorse cough. She had no additional questions this morning.  Objective:  Vital signs in last 24 hours: Vitals:   02/02/16 2036 02/02/16 2036 02/02/16 2045 02/03/16 0455  BP:   (!) 178/81 111/61  Pulse: 78  72 75  Resp:   17 17  Temp:  98.3 F (36.8 C) 98.6 F (37 C) 98.6 F (37 C)  TempSrc:  Oral Oral Oral  SpO2: 100%  100% 99%  Weight:   93 lb 14.4 oz (42.6 kg)   Height:   5' (1.524 m)    Physical Exam  Constitutional: She is oriented to person, place, and time. She appears well-developed and well-nourished.  HENT:  Head: Normocephalic and atraumatic.  Extremely poor dentition and oral hygiene  Cardiovascular: Normal rate and regular rhythm.   No murmur heard. S3 present on examination  Respiratory: Effort normal and breath sounds normal. No respiratory distress. She has no wheezes.  GI: Soft. Bowel sounds are normal. She exhibits no distension. There is no tenderness.  Musculoskeletal: She exhibits no edema.  Neurological: She is alert and oriented to person, place, and time.     Assessment/Plan: Shirley White is a 45 yo female with PMHx of HIV/AIDs, iron deficiency anemia, and sickle cell trait who presents with chest pain and odynophagia.  1. Odynophagia:  Patient presents with a 1.5 weeks history of odynophagia and substernal, non-radiating chest pain associated with swallowing and occasional cough. The patient has a history of HIV with her most recent CD4 count of 30. The patient's odynophagia seems most consistent with an esophageal etiology. Given her CD4 count the differential diagnosis  includes CMV, HSV or fungal esophagitis. Additionally, pill esophagitis in a patient taking antiretroviral therapy may represent the underlying etiology of her odynophagia. I think the most likely cause is either viral or candidal esophagitis causing the patient dysphagia and odynophagia. She will most likely benefit from an EGD to further clarify the etiology. -- GI consult and EGD -- GI cocktail as needed -- Currently nothing by mouth  2. HIV/AIDS:  Uncontrolled since May 2016. Patient follows with Dr. Linus White. Last CD4 count as 30 in August 2017 and HIV viral load >20,000. Patient has not been taking Azithromycin or Bactrim, but has been taking her daily Odefsey.  -- Continue Odefsey -- Restart Bactrim 20 mL BID -- Restart Azithromycin Qweek -- Recheck CD4 and HIV viral load  3. Iron Deficiency Anemia:  Hgb 7.1, MCV 57.1 on admission. Hgb baseline has been 6-7 over the last two years. Ferritin 2 in May 2016. Chronic iron deficiency anemia has been attributed to chronic menorrhagia.  -- Continue iron supplements -- Trend CBC, transfuse Hgb <7 -- Ferritin 3 -- IV iron given  4. Hypokalemia:  Potassium 3.3. Likely 2/2 recent vomiting. -KDur 40 mEq once -Repeat BMET tomorrow am  DVT/PE ppx: Lovenox SQ QD FEN/GI: Regular  Dispo: Anticipated discharge in approximately 1-2 day(s).   Shirley Shoulder, MD 02/03/2016, 11:41 AM Pager: [phone]

## 2016-02-03 NOTE — Progress Notes (Signed)
Consent for endoscopy not obtained as there are no consent orders in chart

## 2016-02-03 NOTE — Consult Note (Signed)
Referring Provider: [facility] Primary Care Physician:  No PCP Per Patient Primary Gastroenterologist: unassigned Reason for Consultation:   dysphagia / odynophagia  HPI: Shirley White is a 45 y.o. female if a 45 year old female with HIV, low CD4 count. Followed by ID, on treatment. She presents with two days history of swallowing difficulties. It is painful to swallow and solids get stuck. This has never happened to patient before. Symptoms not preceded by any meds getting stuck in esophagus to suggest pill esophagitis. She does okay with water.   No other GI symptoms such as bowel changes or blood in stool.    Past Medical History:  Diagnosis Date  . Anemia   . HIV disease [facility])     Past Surgical History:  Procedure Laterality Date  . CESAREAN SECTION      Prior to Admission medications   Medication Sig Start Date End Date Taking? Authorizing Provider  emtricitabine-rilpivir-tenofovir AF (ODEFSEY) 200-25-25 MG TABS tablet Take 1 tablet by mouth daily with supper. 12/13/15  Yes Thayer Headings, MD  iron polysaccharides (POLY-IRON 150) 150 MG capsule Take 1 capsule (150 mg total) by mouth daily. 12/13/15  Yes Thayer Headings, MD  terbinafine (LAMISIL) 1 % cream Apply 1 application topically 2 (two) times daily. Patient not taking: Reported on 02/02/2016 12/13/15   Thayer Headings, MD    Current Facility-Administered Medications  Medication Dose Route Frequency Provider Last Rate Last Dose  . azithromycin (ZITHROMAX) tablet 1,200 mg  1,200 mg Oral Weekly Alexa Angela Burke, MD      . emtricitabine-rilpivir-tenofovir AF (ODEFSEY) 200-25-25 MG per tablet 1 tablet  1 tablet Oral Q supper Alexa R Burns, MD      . enoxaparin (LOVENOX) injection 40 mg  40 mg Subcutaneous QHS Alexa R Burns, MD   40 mg at 02/02/16 2243  . gi cocktail (Maalox,Lidocaine,Donnatal)  30 mL Oral TID PRN Alexa Angela Burke, MD      . iron dextran complex (INFED) 25 mg in sodium chloride 0.9 % 50 mL IVPB  25 mg  Intravenous Once Alexa R Burns, MD       Followed by  . iron dextran complex (INFED) 1,000 mg in sodium chloride 0.9 % 500 mL IVPB  1,000 mg Intravenous Once Alexa R Burns, MD      . ondansetron (ZOFRAN) tablet 4 mg  4 mg Oral Q6H PRN Alexa Angela Burke, MD       Or  . ondansetron (ZOFRAN) injection 4 mg  4 mg Intravenous Q6H PRN Alexa R Burns, MD      . potassium chloride 20 MEQ/15ML (10%) solution 40 mEq  40 mEq Oral Daily Alphonzo Grieve, MD      . sodium chloride flush (NS) 0.9 % injection 3 mL  3 mL Intravenous Q12H Alexa R Burns, MD   3 mL at 02/02/16 2230  . sulfamethoxazole-trimethoprim (BACTRIM,SEPTRA) 200-40 MG/5ML suspension 20 mL  20 mL Oral Q12H Alexa Angela Burke, MD        Allergies as of 02/02/2016  . (No Known Allergies)    Family History  Problem Relation Age of Onset  . Hyperlipidemia Mother   . Cancer Father     Social History   Social History  . Marital status: Single    Spouse name: N/A  . Number of children: N/A  . Years of education: N/A   Occupational History  . Not on file.   Social History Main Topics  . Smoking  status: Never Smoker  . Smokeless tobacco: Never Used  . Alcohol use 0.6 oz/week    1 Cans of beer per week     Comment: occa  . Drug use:     Frequency: 7.0 times per week    Types: Marijuana  . Sexual activity: Yes    Partners: Male    Birth control/ protection: Condom     Comment: given condoms   Other Topics Concern  . Not on file   Social History Narrative  . No narrative on file    Review of Systems: All systems reviewed and negative except where noted in HPI.  Physical Exam: Vital signs in last 24 hours: Temp:  [98.3 F (36.8 C)-98.6 F (37 C)] 98.6 F (37 C) (10/22 0455) Pulse Rate:  [72-100] 75 (10/22 0455) Resp:  [13-20] 17 (10/22 0455) BP: (111-178)/(61-98) 111/61 (10/22 0455) SpO2:  [99 %-100 %] 99 % (10/22 0455) Weight:  [93 lb 14.4 oz (42.6 kg)] 93 lb 14.4 oz (42.6 kg) (10/21 2045) Last BM Date:  02/01/16 General:   Alert,  Thin black female in NAD Head:  Normocephalic and atraumatic. Eyes:  Sclera clear, no icterus.   Conjunctiva pale Ears:  Normal auditory acuity. Nose:  No deformity, discharge,  or lesions. Mouth:  No deformity or lesions. Mild erythema of both tonsils  Neck:  Supple; no masses or thyromegaly. Lungs:  Clear throughout to auscultation.   No wheezes, crackles, or rhonchi.  Heart:  Regular rate and rhythm; no murmurs, clicks, rubs,  or gallops. Abdomen:  Soft,nontender, BS active,nonpalp mass or hsm.   Rectal:  Deferred  Msk:  Symmetrical without gross deformities. . Pulses:  Normal pulses noted. Extremities:  Without clubbing or edema. Neurologic:  Alert and  oriented x4;  grossly normal neurologically. Skin:  Intact without significant lesions or rashes.. Psych:  Alert and cooperative. Normal mood and affect.  Intake/Output from previous day: 10/21 0701 - 10/22 0700 In: 1000 [IV Piggyback:1000] Out: 600 [Urine:600] Intake/Output this shift: No intake/output data recorded.  Lab Results:  Recent Labs  02/02/16 1607 02/03/16 0351  WBC 6.6 4.0  HGB 7.1* 7.2*  HCT 24.8* 26.0*  PLT 484* 434*   BMET  Recent Labs  02/02/16 1607 02/03/16 0351  NA 134* 140  K 3.3* 3.2*  CL 102 107  CO2 24 25  GLUCOSE 101* 89  BUN <5* <5*  CREATININE 0.48 0.48  CALCIUM 8.8* 8.3*    Studies/Results: Dg Chest 2 View  Result Date: 02/02/2016 CLINICAL DATA:  Chest pain and cough EXAM: CHEST  2 VIEW COMPARISON:  06/19/2015 chest radiograph. FINDINGS: Stable cardiomediastinal silhouette with normal heart size. No pneumothorax. No pleural effusion. Lungs appear clear, with no acute consolidative airspace disease and no pulmonary edema. Stable mild eventration of the left hemidiaphragm. IMPRESSION: No active cardiopulmonary disease. Electronically Signed   By: Ilona Sorrel M.D.   On: 02/02/2016 17:04    IMPRESSION / PLAN:  50. 45 year old female with acute  odynophagia / dysphagia. She has HIV with CD4 of 30. At risk for candida esophagitis. Other possibilities include HSV, CMV.  -Reasonable to do EGD since apparently never had one. This way we can know exactly what we are treating. The risks and benefits of the procedure were discussed with the patient and she agrees to proceed.   2. HIV, low CD4. Followed outpatient by ID - Dr. Linus Salmons.   3. Chronic (years), severe microcytic anemia . Heavy menses. n the past she  hasn't been compliant with iron because of cost and constipation. Hgb stable at 7.2. .     Tye Savoy  02/03/2016, 10:27 AM  Pager number [phone]

## 2016-02-03 NOTE — Op Note (Signed)
[facility] Patient Name: Shirley White Procedure Date : 02/03/2016 MRN: [id number] Attending MD: Carlota Raspberry. Christabella Alvira MD, MD Date of Birth: 07/02/1970 CSN: [id number] Age: 45 Admit Type: Inpatient Procedure:                Upper GI endoscopy Indications:              Odynophagia, history of HIV with low CD4 count Providers:                Remo Lipps P. Lisel Siegrist MD, MD, Dortha Schwalbe RN,                            RN, William Dalton, Technician Referring MD:              Medicines:                Fentanyl 75 micrograms IV, Midazolam 7 mg IV,                            Diphenhydramine 50 mg IV Complications:            No immediate complications. Estimated blood loss:                            None. Estimated Blood Loss:     Estimated blood loss: none. Procedure:                Pre-Anesthesia Assessment:                           - Prior to the procedure, a History and Physical                            was performed, and patient medications and                            allergies were reviewed. The patient's tolerance of                            previous anesthesia was also reviewed. The risks                            and benefits of the procedure and the sedation                            options and risks were discussed with the patient.                            All questions were answered, and informed consent                            was obtained. Prior Anticoagulants: The patient has                            taken no previous anticoagulant or antiplatelet  agents. ASA Grade Assessment: III - A patient with                            severe systemic disease. After reviewing the risks                            and benefits, the patient was deemed in                            satisfactory condition to undergo the procedure.                           After obtaining informed consent, the endoscope was         passed under direct vision. Throughout the                            procedure, the patient's blood pressure, pulse, and                            oxygen saturations were monitored continuously. The                            [id number]) scope was introduced through the                            mouth, and advanced to the second part of duodenum.                            The upper GI endoscopy was accomplished without                            difficulty. The patient tolerated the procedure                            well. Scope In: Scope Out: Findings:      Severe esophageal candidiasis was found along the entire esophagus,       characterized by thick white plaques. Brushings for microbiology were       obtained to confirm the diagnosis.      The entire examined stomach was normal.      The duodenal bulb and second portion of the duodenum were normal. Impression:               - Severe candidiasis esophagitis. Brushings                            performed.                           - Normal stomach.                           - Normal duodenal bulb and second portion of the  duodenum. Moderate Sedation:      Moderate (conscious) sedation was administered by the endoscopy nurse       and supervised by the endoscopist. The following parameters were       monitored: oxygen saturation, heart rate, blood pressure, and response       to care. Total physician intraservice time was 12 minutes. Recommendation:           - Return patient to hospital ward for ongoing care.                           - Soft diet as tolerated                           - Continue present medications                           - Fluconazole 400mg  daily for 14-21 days for                            treatment of esophageal candidiasis if no                            contraindications                           - Await pathology results.                           - I  anticipate improvement with treatment of                            candidiasis in the next few days. GI will sign off                            for now, please call with any questions moving                            forward Procedure Code(s):        --- Professional ---                           [phone], Esophagogastroduodenoscopy, flexible,                            transoral; diagnostic, including collection of                            specimen(s) by brushing or washing, when performed                            (separate procedure)                           99152, Moderate sedation services provided by the                            same physician  or other qualified health care                            professional performing the diagnostic or                            therapeutic service that the sedation supports,                            requiring the presence of an independent trained                            observer to assist in the monitoring of the                            patient's level of consciousness and physiological                            status; initial 15 minutes of intraservice time,                            patient age 31 years or older Diagnosis Code(s):        --- Professional ---                           B37.81, Candidal esophagitis                           R13.10, Dysphagia, unspecified CPT copyright 2016 American Medical Association. All rights reserved. The codes documented in this report are preliminary and upon coder review may  be revised to meet current compliance requirements. Remo Lipps P. Verdean Murin MD, MD 02/03/2016 7:58:42 PM This report has been signed electronically. Number of Addenda: 0

## 2016-02-03 NOTE — H&P (View-Only) (Signed)
Referring Provider: [facility] Primary Care Physician:  No PCP Per Patient Primary Gastroenterologist: unassigned Reason for Consultation:   dysphagia / odynophagia  HPI: Shirley White is a 45 y.o. female if a 45 year old female with HIV, low CD4 count. Followed by ID, on treatment. She presents with two days history of swallowing difficulties. It is painful to swallow and solids get stuck. This has never happened to patient before. Symptoms not preceded by any meds getting stuck in esophagus to suggest pill esophagitis. She does okay with water.   No other GI symptoms such as bowel changes or blood in stool.    Past Medical History:  Diagnosis Date  . Anemia   . HIV disease [facility])     Past Surgical History:  Procedure Laterality Date  . CESAREAN SECTION      Prior to Admission medications   Medication Sig Start Date End Date Taking? Authorizing Provider  emtricitabine-rilpivir-tenofovir AF (ODEFSEY) 200-25-25 MG TABS tablet Take 1 tablet by mouth daily with supper. 12/13/15  Yes Thayer Headings, MD  iron polysaccharides (POLY-IRON 150) 150 MG capsule Take 1 capsule (150 mg total) by mouth daily. 12/13/15  Yes Thayer Headings, MD  terbinafine (LAMISIL) 1 % cream Apply 1 application topically 2 (two) times daily. Patient not taking: Reported on 02/02/2016 12/13/15   Thayer Headings, MD    Current Facility-Administered Medications  Medication Dose Route Frequency Provider Last Rate Last Dose  . azithromycin (ZITHROMAX) tablet 1,200 mg  1,200 mg Oral Weekly Alexa Angela Burke, MD      . emtricitabine-rilpivir-tenofovir AF (ODEFSEY) 200-25-25 MG per tablet 1 tablet  1 tablet Oral Q supper Alexa R Burns, MD      . enoxaparin (LOVENOX) injection 40 mg  40 mg Subcutaneous QHS Alexa R Burns, MD   40 mg at 02/02/16 2243  . gi cocktail (Maalox,Lidocaine,Donnatal)  30 mL Oral TID PRN Alexa Angela Burke, MD      . iron dextran complex (INFED) 25 mg in sodium chloride 0.9 % 50 mL IVPB  25 mg  Intravenous Once Alexa R Burns, MD       Followed by  . iron dextran complex (INFED) 1,000 mg in sodium chloride 0.9 % 500 mL IVPB  1,000 mg Intravenous Once Alexa R Burns, MD      . ondansetron (ZOFRAN) tablet 4 mg  4 mg Oral Q6H PRN Alexa Angela Burke, MD       Or  . ondansetron (ZOFRAN) injection 4 mg  4 mg Intravenous Q6H PRN Alexa R Burns, MD      . potassium chloride 20 MEQ/15ML (10%) solution 40 mEq  40 mEq Oral Daily Alphonzo Grieve, MD      . sodium chloride flush (NS) 0.9 % injection 3 mL  3 mL Intravenous Q12H Alexa R Burns, MD   3 mL at 02/02/16 2230  . sulfamethoxazole-trimethoprim (BACTRIM,SEPTRA) 200-40 MG/5ML suspension 20 mL  20 mL Oral Q12H Alexa Angela Burke, MD        Allergies as of 02/02/2016  . (No Known Allergies)    Family History  Problem Relation Age of Onset  . Hyperlipidemia Mother   . Cancer Father     Social History   Social History  . Marital status: Single    Spouse name: N/A  . Number of children: N/A  . Years of education: N/A   Occupational History  . Not on file.   Social History Main Topics  . Smoking  status: Never Smoker  . Smokeless tobacco: Never Used  . Alcohol use 0.6 oz/week    1 Cans of beer per week     Comment: occa  . Drug use:     Frequency: 7.0 times per week    Types: Marijuana  . Sexual activity: Yes    Partners: Male    Birth control/ protection: Condom     Comment: given condoms   Other Topics Concern  . Not on file   Social History Narrative  . No narrative on file    Review of Systems: All systems reviewed and negative except where noted in HPI.  Physical Exam: Vital signs in last 24 hours: Temp:  [98.3 F (36.8 C)-98.6 F (37 C)] 98.6 F (37 C) (10/22 0455) Pulse Rate:  [72-100] 75 (10/22 0455) Resp:  [13-20] 17 (10/22 0455) BP: (111-178)/(61-98) 111/61 (10/22 0455) SpO2:  [99 %-100 %] 99 % (10/22 0455) Weight:  [93 lb 14.4 oz (42.6 kg)] 93 lb 14.4 oz (42.6 kg) (10/21 2045) Last BM Date:  02/01/16 General:   Alert,  Thin black female in NAD Head:  Normocephalic and atraumatic. Eyes:  Sclera clear, no icterus.   Conjunctiva pale Ears:  Normal auditory acuity. Nose:  No deformity, discharge,  or lesions. Mouth:  No deformity or lesions. Mild erythema of both tonsils  Neck:  Supple; no masses or thyromegaly. Lungs:  Clear throughout to auscultation.   No wheezes, crackles, or rhonchi.  Heart:  Regular rate and rhythm; no murmurs, clicks, rubs,  or gallops. Abdomen:  Soft,nontender, BS active,nonpalp mass or hsm.   Rectal:  Deferred  Msk:  Symmetrical without gross deformities. . Pulses:  Normal pulses noted. Extremities:  Without clubbing or edema. Neurologic:  Alert and  oriented x4;  grossly normal neurologically. Skin:  Intact without significant lesions or rashes.. Psych:  Alert and cooperative. Normal mood and affect.  Intake/Output from previous day: 10/21 0701 - 10/22 0700 In: 1000 [IV Piggyback:1000] Out: 600 [Urine:600] Intake/Output this shift: No intake/output data recorded.  Lab Results:  Recent Labs  02/02/16 1607 02/03/16 0351  WBC 6.6 4.0  HGB 7.1* 7.2*  HCT 24.8* 26.0*  PLT 484* 434*   BMET  Recent Labs  02/02/16 1607 02/03/16 0351  NA 134* 140  K 3.3* 3.2*  CL 102 107  CO2 24 25  GLUCOSE 101* 89  BUN <5* <5*  CREATININE 0.48 0.48  CALCIUM 8.8* 8.3*    Studies/Results: Dg Chest 2 View  Result Date: 02/02/2016 CLINICAL DATA:  Chest pain and cough EXAM: CHEST  2 VIEW COMPARISON:  06/19/2015 chest radiograph. FINDINGS: Stable cardiomediastinal silhouette with normal heart size. No pneumothorax. No pleural effusion. Lungs appear clear, with no acute consolidative airspace disease and no pulmonary edema. Stable mild eventration of the left hemidiaphragm. IMPRESSION: No active cardiopulmonary disease. Electronically Signed   By: Ilona Sorrel M.D.   On: 02/02/2016 17:04    IMPRESSION / PLAN:  14. 45 year old female with acute  odynophagia / dysphagia. She has HIV with CD4 of 30. At risk for candida esophagitis. Other possibilities include HSV, CMV.  -Reasonable to do EGD since apparently never had one. This way we can know exactly what we are treating. The risks and benefits of the procedure were discussed with the patient and she agrees to proceed.   2. HIV, low CD4. Followed outpatient by ID - Dr. Linus Salmons.   3. Chronic (years), severe microcytic anemia . Heavy menses. n the past she  hasn't been compliant with iron because of cost and constipation. Hgb stable at 7.2. .     Tye Savoy  02/03/2016, 10:27 AM  Pager number [phone]

## 2016-02-03 NOTE — Interval H&P Note (Signed)
History and Physical Interval Note:  02/03/2016 7:37 PM  Shirley White  has presented today for surgery, with the diagnosis of odynophagia and dysphagia  The various methods of treatment have been discussed with the patient and family. After consideration of risks, benefits and other options for treatment, the patient has consented to  Procedure(s): ESOPHAGOGASTRODUODENOSCOPY (EGD) (N/A) as a surgical intervention .  The patient's history has been reviewed, patient examined, no change in status, stable for surgery.  I have reviewed the patient's chart and labs.  Questions were answered to the patient's satisfaction.     Renelda Loma Asli Tokarski

## 2016-02-03 NOTE — Progress Notes (Signed)
Report called to on [place], [employer]

## 2016-02-04 ENCOUNTER — Encounter (HOSPITAL_COMMUNITY): Payer: Self-pay | Admitting: Gastroenterology

## 2016-02-04 LAB — CBC
HCT: 20.6 % — ABNORMAL LOW (ref 36.0–46.0)
HEMOGLOBIN: 5.9 g/dL — AB (ref 12.0–15.0)
MCH: 16.5 pg — AB (ref 26.0–34.0)
MCHC: 28.6 g/dL — AB (ref 30.0–36.0)
MCV: 57.7 fL — ABNORMAL LOW (ref 78.0–100.0)
Platelets: 429 10*3/uL — ABNORMAL HIGH (ref 150–400)
RBC: 3.57 MIL/uL — ABNORMAL LOW (ref 3.87–5.11)
RDW: 19.7 % — AB (ref 11.5–15.5)
WBC: 5.7 10*3/uL (ref 4.0–10.5)

## 2016-02-04 LAB — BASIC METABOLIC PANEL
Anion gap: 6 (ref 5–15)
CALCIUM: 8.4 mg/dL — AB (ref 8.9–10.3)
CHLORIDE: 110 mmol/L (ref 101–111)
CO2: 22 mmol/L (ref 22–32)
CREATININE: 0.49 mg/dL (ref 0.44–1.00)
GFR calc Af Amer: >60 mL/min (ref >60)
GFR calc non Af Amer: >60 mL/min (ref >60)
Glucose, Bld: 78 mg/dL (ref 65–99)
Potassium: 3.3 mmol/L — ABNORMAL LOW (ref 3.5–5.1)
SODIUM: 138 mmol/L (ref 135–145)

## 2016-02-04 LAB — T-HELPER CELLS (CD4) COUNT (NOT AT ARMC)
CD4 T CELL ABS: 30 /uL — AB (ref 400–2700)
CD4 T CELL HELPER: 2 % — AB (ref 33–55)

## 2016-02-04 LAB — HEMOGLOBIN AND HEMATOCRIT, BLOOD
HEMATOCRIT: 30.7 % — AB (ref 36.0–46.0)
HEMOGLOBIN: 9.7 g/dL — AB (ref 12.0–15.0)

## 2016-02-04 LAB — PREPARE RBC (CROSSMATCH)

## 2016-02-04 MED ORDER — INFLUENZA VAC SPLIT QUAD 0.5 ML IM SUSY
0.5000 mL | PREFILLED_SYRINGE | INTRAMUSCULAR | Status: AC
  Administered 2016-02-05: 0.5 mL via INTRAMUSCULAR

## 2016-02-04 MED ORDER — SODIUM CHLORIDE 0.9 % IV SOLN
Freq: Once | INTRAVENOUS | Status: AC
  Administered 2016-02-04: 14:00:00 via INTRAVENOUS

## 2016-02-04 MED ORDER — FLUCONAZOLE IN SODIUM CHLORIDE 400-0.9 MG/200ML-% IV SOLN
400.0000 mg | Freq: Once | INTRAVENOUS | Status: AC
  Administered 2016-02-04: 400 mg via INTRAVENOUS
  Filled 2016-02-04: qty 200

## 2016-02-04 MED ORDER — FLUCONAZOLE 40 MG/ML PO SUSR
400.0000 mg | Freq: Every day | ORAL | Status: DC
  Administered 2016-02-04: 400 mg via ORAL
  Filled 2016-02-04: qty 10

## 2016-02-04 NOTE — Progress Notes (Signed)
CRITICAL VALUE ALERT  Critical value received:  Hgb 5.9  Date of notification:  02/04/16  Time of notification:  0558  Critical value read back:Yes.    Nurse who received alert:  Judge Stall, RN  MD notified (1st page):  Dr. Heber C[place]  Time of first page:  0603  MD notified (2nd page):  Time of second page:  Responding MD:  Dr. Heber Sutter  Time MD responded:  [phone]

## 2016-02-04 NOTE — Progress Notes (Signed)
   02/04/16 1654  Vitals  Temp 99.1 F (37.3 C)  Temp Source Oral  BP (!) 178/88  Pulse Rate 77  Resp 18  Oxygen Therapy  SpO2 100 %  O2 Device Room Air   Paged MD Lovena Le, said to recheck vitals in 30 min, if still elevated page on call.

## 2016-02-04 NOTE — Progress Notes (Signed)
I switched the patient to IV fluconazole and have given her 400 mg. This is a total of 800 mg today which would be the equivalent loading dose for esophageal candidiasis. We will treat with another dose of IV 400 mg fluconazole tomorrow and then transfer her to by mouth medications at time of discharge.

## 2016-02-04 NOTE — Progress Notes (Signed)
Subjective: Patient continues to have pain with swallowing. She had an EGD yesterday afternoon without complication. She denied chest pain or shortness of breath. She denied nausea, vomiting or abdominal pain. She denies hematemesis. She denies hematochezia or melena. She stated this morning that she has been non-adherent with her medications over the past several months. She had no additional questions this morning.  Objective:  Vital signs in last 24 hours: Vitals:   02/03/16 2209 02/04/16 0453 02/04/16 1028 02/04/16 1059  BP: (!) 124/58 (!) 142/69 (!) 167/84 137/74  Pulse: 74 79 82 79  Resp: 17 18 20 18   Temp: 98 F (36.7 C) 98.1 F (36.7 C) 98.8 F (37.1 C) 98.3 F (36.8 C)  TempSrc: Oral Oral Oral Oral  SpO2: 97% 100% 100% 100%  Weight:      Height:       Physical Exam  Constitutional: She is oriented to person, place, and time. She appears well-developed and well-nourished.  HENT:  Head: Normocephalic and atraumatic.  Cardiovascular: Normal rate and regular rhythm.  Exam reveals no gallop and no friction rub.   No murmur heard. Respiratory: Effort normal and breath sounds normal.  GI: Soft. Bowel sounds are normal. She exhibits no distension. There is no tenderness.  Musculoskeletal: She exhibits no edema.  Neurological: She is alert and oriented to person, place, and time.     Assessment/Plan: Shirley White is a 45 yo female with PMHx of HIV/AIDs, iron deficiency anemia, and sickle cell trait who presents with chest pain and odynophagia.  1. Odynophagia:  Patient had EGD yesterday which showed diffuse upper middle and lower esophageal candidiasis. The most likely etiology for the patient's candidal infection is secondary to immunosuppression with a CD4 count of 50 and uncontrolled HIV/AIDS. She will need oral fluconazole treatment for a minimum of 14 days and possibly extending to 21 days. -- Advance diet as tolerated --Fluconazole 400 mg and oral suspension  2.  HIV/AIDS:  Uncontrolled since May 2016. Patient follows with Dr. Linus Salmons. Last CD4 count as 30 in August 2017 and HIV viral load >20,000. Patient has not been taking Azithromycin or Bactrim, but has been taking her daily Odefsey.  -- Continue Odefsey -- Restart Bactrim 20 mL BID -- Restart Azithromycin Qweek -- Most recent CD4 count 30 -- Follow-up viral load  3. Iron Deficiency Anemia:  Patient has a long history of iron deficiency anemia dating for several years. She ascribes this to heavy menstrual bleeding. Currently, her periods are regular and she does not have bleeding between periods. EGD yesterday did not show evidence of upper GI bleed. She denies hematemesis, hematochezia or melena. She has no signs or symptoms associated with a lower GI bleed. Her hemoglobin overnight droped 2 units to 5.9. I think the most likely reason for this acute drop in hemoglobin is secondary to hemodilution after receiving 4 units of IV fluids. She is not currently bleeding and does not have any signs or symptoms associated with upper or lower GI bleed. She has no belly pain or thigh pain and has no reason to have a retroperitoneal bleed. We will transfuse her 2 units and check a posttransfusion hemoglobin and hematocrit. She received IV iron yesterday and we will continue with oral iron therapy as an outpatient. -- Trend CBC -- Transfused 2 units of packed red blood cells -- Postreinfusion H&H -- Ferritin 3 -- IV iron given  4. Hypokalemia:  Potassium 3.3. -KDur 40 mEq -Repeat BMET tomorrow am  DVT/PE ppx:  Lovenox SQ QD FEN/GI: Regular  Dispo: Anticipated discharge tomorrow.   Shirley Shoulder, MD 02/04/2016, 11:09 AM Pager: [phone]

## 2016-02-05 DIAGNOSIS — B3781 Candidal esophagitis: Principal | ICD-10-CM

## 2016-02-05 DIAGNOSIS — Z9114 Patient's other noncompliance with medication regimen: Secondary | ICD-10-CM

## 2016-02-05 DIAGNOSIS — D509 Iron deficiency anemia, unspecified: Secondary | ICD-10-CM

## 2016-02-05 LAB — TYPE AND SCREEN
ABO/RH(D): B POS
ANTIBODY SCREEN: NEGATIVE
Unit division: 0
Unit division: 0

## 2016-02-05 LAB — CBC
HCT: 30.8 % — ABNORMAL LOW (ref 36.0–46.0)
Hemoglobin: 10 g/dL — ABNORMAL LOW (ref 12.0–15.0)
MCH: 20.4 pg — ABNORMAL LOW (ref 26.0–34.0)
MCHC: 32.5 g/dL (ref 30.0–36.0)
MCV: 63 fL — AB (ref 78.0–100.0)
PLATELETS: 422 10*3/uL — AB (ref 150–400)
RBC: 4.89 MIL/uL (ref 3.87–5.11)
RDW: 25.4 % — AB (ref 11.5–15.5)
WBC: 8.5 10*3/uL (ref 4.0–10.5)

## 2016-02-05 LAB — BASIC METABOLIC PANEL
ANION GAP: 8 (ref 5–15)
BUN: <5 mg/dL — ABNORMAL LOW (ref 6–20)
CALCIUM: 8.9 mg/dL (ref 8.9–10.3)
CO2: 21 mmol/L — ABNORMAL LOW (ref 22–32)
Chloride: 105 mmol/L (ref 101–111)
Creatinine, Ser: 0.52 mg/dL (ref 0.44–1.00)
GFR calc Af Amer: >60 mL/min (ref >60)
GLUCOSE: 94 mg/dL (ref 65–99)
Potassium: 3.7 mmol/L (ref 3.5–5.1)
Sodium: 134 mmol/L — ABNORMAL LOW (ref 135–145)

## 2016-02-05 MED ORDER — SULFAMETHOXAZOLE-TRIMETHOPRIM 200-40 MG/5ML PO SUSP: 160.0000 mg | ORAL | 1 refills | Status: DC

## 2016-02-05 MED ORDER — SULFAMETHOXAZOLE-TRIMETHOPRIM 200-40 MG/5ML PO SUSP: 20.0000 mL | ORAL | Status: DC

## 2016-02-05 MED ORDER — FLUCONAZOLE 10 MG/ML PO SUSR: 400.0000 mg | Freq: Every day | ORAL | 0 refills | Status: DC

## 2016-02-05 MED ORDER — FLUCONAZOLE IN SODIUM CHLORIDE 400-0.9 MG/200ML-% IV SOLN
400.0000 mg | Freq: Once | INTRAVENOUS | Status: AC
  Administered 2016-02-05: 400 mg via INTRAVENOUS
  Filled 2016-02-05 (×2): qty 200

## 2016-02-05 MED ORDER — AZITHROMYCIN 200 MG/5ML PO SUSR: 600.0000 mg | ORAL | 3 refills | Status: DC

## 2016-02-05 MED ORDER — AZITHROMYCIN 600 MG PO TABS: 1200.0000 mg | ORAL_TABLET | ORAL | 2 refills | Status: DC

## 2016-02-05 NOTE — Progress Notes (Signed)
Starlit E Mcclarty to be D/C'd  per MD order. Discussed with the patient and all questions fully answered.  VSS, Skin clean, dry and intact without evidence of skin break down, no evidence of skin tears noted.  IV catheter discontinued intact. Site without signs and symptoms of complications. Dressing and pressure applied.  An After Visit Summary was printed and given to the patient. Patient received prescription.  D/c education completed with patient/family including follow up instructions, medication list, d/c activities limitations if indicated, with other d/c instructions as indicated by MD - patient able to verbalize understanding, all questions fully answered.   Patient instructed to return to ED, call 911, or call MD for any changes in condition.   Patient to be escorted via [place], and D/C home via private auto.

## 2016-02-05 NOTE — Discharge Summary (Signed)
Name: Shirley White MRN: [id number] DOB: 03-Dec-1970 45 y.o. PCP: No Pcp Per Patient  Date of Admission: 02/02/2016  4:24 PM Date of Discharge: 02/05/2016 Attending Physician: Aldine Contes, MD  Discharge Diagnosis: 1. Esophageal candidiasis 2. Iron deficiency anemia 3. AIDS  Discharge Medications:   Medication List    STOP taking these medications   fluconazole 200 MG tablet Commonly known as:  DIFLUCAN Replaced by:  fluconazole 10 MG/ML suspension     TAKE these medications   azithromycin 200 MG/5ML suspension Commonly known as:  ZITHROMAX Take 15 mLs (600 mg total) by mouth once a week.   emtricitabine-rilpivir-tenofovir AF 200-25-25 MG Tabs tablet Commonly known as:  ODEFSEY Take 1 tablet by mouth daily with supper.   fluconazole 10 MG/ML suspension Commonly known as:  DIFLUCAN Take 40 mLs (400 mg total) by mouth daily. Replaces:  fluconazole 200 MG tablet   iron polysaccharides 150 MG capsule Commonly known as:  POLY-IRON 150 Take 1 capsule (150 mg total) by mouth daily.   sulfamethoxazole-trimethoprim 200-40 MG/5ML suspension Commonly known as:  BACTRIM,SEPTRA Take 20 mLs by mouth 3 (three) times a week. Start taking on:  02/06/2016   terbinafine 1 % cream Commonly known as:  LAMISIL Apply 1 application topically 2 (two) times daily.       Disposition and follow-up:   Ms.Shirley White was discharged from [facility] in Good condition.  At the hospital follow up visit please address:  1.  Please ensure the patient continues to take her ART therapy. Please ensure she is taking her antimicrobial prophylaxis therapy. Please ensure she finishes her course of fluconazole.  2.  Labs / imaging needed at time of follow-up: None  3.  Pending labs/ test needing follow-up: None  Follow-up Appointments: Follow-up Information    [place]. Schedule an appointment as soon as possible for a visit  today.   Contact information: [place] [phone] [place] [facility] Course by problem list:   1.Candida esophagitis  The patient presented to the [facility] emergency department on 02/02/2016 with a 1-1/2 week history of sharp, nonradiating substernal chest pain that was most severe when swallowing, or coughing. The patient stated that her pain was most severe whenever she was trying to swallow liquids or solids. Additionally, the patient had some dysphagia and regurgitation. In the emergency department an EKG was obtained which did not show acute ST segment elevation. Troponins were ordered which were negative. She was then admitted to the [facility] internal medicine teaching service for further workup, management and evaluation.  Importantly, the patient has HIV and is noncompliant with her medications. At the time of her admission her CD4 count was 30. While inpatient, gastroenterology was consulted who performed an EGD. Results of the EGD showed severe Candida in all segments of the esophagus. She was then started on fluconazole 400 mg once daily and will need 14-21 days to complete therapy. Since she is unable to swallow the pill this has been prescribed as an oral suspension. Please make sure the patient is taking this correctly as I am concerned she may not be taking this as was explained to her. She will have an additional 19 days of fluconazole therapy.   2. Iron deficiency anemia The patient has a long multi year history of iron deficiency anemia. This has been attributed to heavy menstrual bleeding. While in the  hospital the patient had a hemoglobin of 5.9. She was transfused 2 units of packed red blood cells and given IV iron with a posttransfusion H&H of 9.7. On obtaining a thorough history from the patient she denied hematemesis, hematochezia or melena. She denied blood in her urine. EGD did not show any evidence of upper  gastrointestinal bleed. The most likely cause for the patient's iron deficiency  Anemia is secondary to heavy menstrual bleeding, poor by mouth intake and anemia of chronic disease secondary to poorly controlled HIV infection. A repeat CBC the day following her transfusion showed a hemoglobin of 10. She may benefit from scheduled IV iron infusions in the outpatient setting as it seems she does not tolerate oral iron therapy very well and also has trouble affording this medication.   2. AIDS The time of presentation the patient's CD4 count was 30. Additionally, she had candidal esophagitis. When getting a medication history from the patient it seemed that she was intermittently compliant with her antiretroviral therapy. She was not on any antimicrobial prophylaxis. She will continue with [facility] for the treatment of HIV. Additionally, she was started on sulfamethoxazole-trimethoprim for prophylaxis in addition to oral azithromycin. She will need to stay on these medications until her CD4 count improves and these medications are discontinued based on infectious disease recommendations. As above the patient was started on oral suspensions of both bactrim and azithromycin. Please ensure she is taking these medications as they are prescribed. The bacrtim will be 3 times weekly and azithromycin once weekly.  Discharge Vitals:   BP (!) 145/74 (BP Location: Left Arm)   Pulse 71   Temp 98.3 F (36.8 C) (Oral)   Resp 18   Ht 5' (1.524 m)   Wt 93 lb 14.4 oz (42.6 kg)   SpO2 100%   BMI 18.34 kg/m   Pertinent Labs, Studies, and Procedures:  1. Chest x-ray-no acute cardiopulmonary abnormality 2. EGD-severe candidal esophagitis  Discharge Instructions: Discharge Instructions    Diet - low sodium heart healthy    Complete by:  As directed    Discharge instructions    Complete by:  As directed    We have started you on a new medication to treat the infection in yout throat. This medication is called  fluconazole. You are to take this medication every day for 19 days.  Additionally, we have started you on to new antibiotics that will help prevent infections in the future. One is called Azithromycin. You are to take 1 pill once per WEEK of the azithromycin. The other new antibiotic is called Bactrim. You are to take this medication 3 times per WEEK.  You have an appointment scheduled in the clinic at [facility] for [place] the 02/08/2016 at 9:45 am. Please ensure you make this appointment.  In addition to the new medications you are taking please continue to take all of the medications you have been prescribed. It will be extremely important that you take the fluconazole everyday for the next 19 days. Additionally, to recap, please take the Azithromycin once per week and the Bactrim three times per week as prescribed.   Increase activity slowly    Complete by:  As directed       Signed: Ophelia Shoulder, MD 02/05/2016, 3:25 PM   Pager: [phone]

## 2016-02-05 NOTE — Progress Notes (Signed)
Subjective: Patient had several episodes of nonbloody, nonbilious emesis overnight. Additionally, she endorsed nausea. She states that her throat pain and odynophagia have improved. She denies cough or chest pain. She was able to tolerate oral liquids this morning. She had no additional questions this morning.  Objective:  Vital signs in last 24 hours: Vitals:   02/04/16 1654 02/04/16 1730 02/04/16 2100 02/05/16 0504  BP: (!) 178/88 (!) 163/87 (!) 157/80 (!) 145/74  Pulse: 77 73 68 71  Resp: 18  18 18   Temp: 99.1 F (37.3 C)  98.6 F (37 C) 98.3 F (36.8 C)  TempSrc: Oral  Oral Oral  SpO2: 100%  100% 100%  Weight:      Height:       Physical Exam  Constitutional: She is oriented to person, place, and time. She appears well-developed.  Appears thin  HENT:  Head: Normocephalic and atraumatic.  Poor oral hygiene and dentition  Cardiovascular: Normal rate and regular rhythm.  Exam reveals no gallop and no friction rub.   No murmur heard. Respiratory: Effort normal and breath sounds normal. No respiratory distress. She has no wheezes.  GI: Soft. Bowel sounds are normal. She exhibits no distension. There is no tenderness.  Musculoskeletal: She exhibits no edema.  Neurological: She is alert and oriented to person, place, and time.     Assessment/Plan: Ms. Dy is a 45 yo female with PMHx of HIV/AIDs, iron deficiency anemia, and sickle cell trait who presents with chest pain and odynophagia.  In summary, patient's odynophagia has improved. She is back on her antiretroviral therapy and antimicrobial prophylaxis for her HIV infection. She will have a dose of IV fluconazole today we will switch to oral medications. She did have several episodes of emesis overnight with no new episodes this morning. If she is able to tolerate by mouth intake today we'll plan for discharge this evening with follow-up in ID clinic and the [facility] internal medicine teaching clinic.  1. Odynophagia,  improving Patient had EGD yesterday which showed diffuse upper middle and lower esophageal candidiasis. The most likely etiology for the patient's candidal infection is secondary to immunosuppression with a CD4 count of 50 and uncontrolled HIV/AIDS. She will need oral fluconazole treatment for a minimum of 14 days and possibly extending to 21 days. -- Advance diet as tolerated --Fluconazole 400 mg and oral suspension  2. HIV/AIDS:  Uncontrolled since May 2016. Patient follows with Dr. Linus Salmons. Last CD4 count as 30 in August 2017 and HIV viral load >20,000. Patient has not been taking Azithromycin or Bactrim, but has been taking her daily Odefsey.  -- Continue Odefsey -- Bactrim prophylaxis dosing -- Restart Azithromycin Qweek -- Most recent CD4 count 30 -- Follow-up viral load  3. Iron Deficiency Anemia:  Patient was transfused 2 units of packed red blood cells yesterday. Posttransfusion H&H was 9.7. Repeat CBC this morning was 10.0. She was also given IV iron while inpatient. The most likely cause of the patient's iron deficiency anemia secondary to heavy menstrual bleeding, poor oral nutrition and anemia of chronic disease secondary to poorly controlled HIV. -- Continue to monitor  4. Nausea and vomiting The patient had several episodes of nonbloody, nonbilious emesis overnight. She has had no emesis this morning. She does continue to have mild nausea. I think the most likely etiology for the patient's nausea and vomiting are secondary to azithromycin antibiotic use. She does not appear infected. Denies abdominal pain. Does not have a leukocytosis or fever. We  will treat with Zofran and continue to monitor her this morning and early afternoon. If she is tolerating good by mouth intake we'll plan for discharge this afternoon. -- Zofran  DVT/PE ppx: Lovenox SQ QD FEN/GI: Regular  Dispo: Anticipated discharge today or tomorrow.   Ophelia Shoulder, MD 02/05/2016, 11:36 AM Pager: [phone]

## 2016-02-05 NOTE — Care Management Note (Signed)
Case Management Note  Patient Details  Name: Shirley White MRN: [id number] Date of Birth: Dec 30, 1970  Subjective/Objective:                    Action/Plan:  [place] letter given and explained to patient. Letter dated 02-05-16 to 02-12-16 good for one time use with prescriptions given at discharge from hospital . Pain medication and over the counter medication not covered.   [employer] [facility] information also given.  Patient voiced understanding to all of the above. Expected Discharge Date:                  Expected Discharge Plan:  Home/Self Care  In-House Referral:     Discharge planning Services  CM Consult, [place] [facility], Medication Assistance, [facility]  Post Acute Care Choice:    Choice offered to:  Patient  DME Arranged:    DME Agency:     HH Arranged:    [place] Agency:     Status of Service:  Completed, signed off  If discussed at [employer], dates discussed:    Additional Comments:  Marilu Favre, RN 02/05/2016, 10:37 AM

## 2016-02-07 ENCOUNTER — Telehealth: Payer: Self-pay | Admitting: General Practice

## 2016-02-07 NOTE — Telephone Encounter (Signed)
APT. REMINDER CALL, NO ANSWER, NO VOICEMAIL °

## 2016-02-08 ENCOUNTER — Encounter: Payer: Self-pay | Admitting: Internal Medicine

## 2016-02-08 ENCOUNTER — Telehealth: Payer: Self-pay

## 2016-02-08 ENCOUNTER — Ambulatory Visit: Payer: Medicaid Other

## 2016-02-08 NOTE — Telephone Encounter (Signed)
Returned pt's call - no one answered; left message. Also has hospital f/u appt on Monday-left message to keep appt and discuss this at the visit.

## 2016-02-08 NOTE — Telephone Encounter (Signed)
Pt states she need a letter for work.

## 2016-02-11 ENCOUNTER — Ambulatory Visit (INDEPENDENT_AMBULATORY_CARE_PROVIDER_SITE_OTHER): Payer: Self-pay | Admitting: Internal Medicine

## 2016-02-11 VITALS — BP 119/64 | HR 80 | Temp 98.3°F | Ht 61.0 in | Wt 94.4 lb

## 2016-02-11 DIAGNOSIS — B20 Human immunodeficiency virus [HIV] disease: Secondary | ICD-10-CM

## 2016-02-11 DIAGNOSIS — D500 Iron deficiency anemia secondary to blood loss (chronic): Secondary | ICD-10-CM

## 2016-02-11 DIAGNOSIS — B3781 Candidal esophagitis: Secondary | ICD-10-CM

## 2016-02-11 NOTE — Patient Instructions (Addendum)
It was a pleasure to meet you Shirley White.  I am glad you are feeling better. Please call both the [facility] and [facility] to see if they have your Odefsey and Bactrim.  Please call us if there is any issue with your medication.  You can take over the counter oral iron (ferrous sulfate) if you are able to tolerate the pills.  Please call to make an appointment with Dr. Linus Salmons.

## 2016-02-11 NOTE — Progress Notes (Signed)
   CC: Esophageal candidiasis  HPI:  Shirley White is a 45 y.o. female with AIDS and Iron Deficiency who presents for hospital follow up care of Esophageal candidiasis.  Esophageal Candidiasis: Patient admitted from 10/21 - 10/24 for severe esophageal candidiasis throughout the esophagus. She was started on oral fluconazole 400 mg daily suspension for 14-21 day course. She reports adherence and significant improvement in her symptoms without further dysphagia or odynophagia. She is eating well tolerating solids and liquids as well as her current medications.  AIDS: Patient with most recent CD4 count of 30 on 02/03/16 (RNA still pending). She had been represcribed Odefsey by ID 2 months ago which patient was taking sporadically prior to her recent hospitalization. There is concern for resistance as she was off the [facility] for some time and she is to follow up with ID for further management. She was started on PJP and MAC prophylaxis during her admission with oral Bactrim suspension 3 times weekly and oral Azithromycin suspension once weekly. Patient states that she has not taken her Odefsey or Bactrim since discharge as her pharmacy did not have these.  Iron Deficiency Anemia: Thought related to heavy menstrual bleeding. She had a Hgb of 5.9 in the hospital without obvious sign of bleeding. Ferritin was 3. She received 2 units PRBCs and IV iron transfusion. She states she was unable to afford her oral outpatient iron supplementation. She denies any bleeding with LMP on 01/21/16. She denies any lightheadedness, dizziness, or LOC. She says she has taking Ferrous sulfate before which she was able to tolerate.  Past Medical History:  Diagnosis Date  . Anemia   . HIV disease ([place])     Review of Systems:   Review of Systems  Constitutional: Negative for chills, fever and malaise/fatigue.  Respiratory: Positive for cough. Negative for hemoptysis, sputum production and shortness of breath.     Cardiovascular: Negative for chest pain.  Gastrointestinal: Negative for abdominal pain, blood in stool, constipation, diarrhea, melena, nausea and vomiting.       No dysphagia, odynophagia, hematemesis  Neurological: Negative for dizziness and loss of consciousness.     Physical Exam:  Vitals:   02/11/16 1400  BP: 119/64  Pulse: 80  Temp: 98.3 F (36.8 C)  TempSrc: Oral  SpO2: 100%  Weight: 94 lb 6.4 oz (42.8 kg)  Height: 5\' 1"  (1.549 m)   Physical Exam  Constitutional: She is oriented to person, place, and time.  Thin woman, no acute distress  HENT:  Mouth/Throat: Oropharynx is clear and moist. No oropharyngeal exudate.  Eyes: Conjunctivae are normal.  Cardiovascular: Normal rate and regular rhythm.   Systolic murmur LUS border  Pulmonary/Chest: Effort normal. No respiratory distress. She has no wheezes. She has no rales.  Neurological: She is alert and oriented to person, place, and time.  Skin: Skin is warm. Capillary refill takes less than 2 seconds. She is not diaphoretic.    Assessment & Plan:   See Encounters Tab for problem based charting.  Patient discussed with Dr. Angelia Mould

## 2016-02-12 ENCOUNTER — Telehealth: Payer: Self-pay | Admitting: *Deleted

## 2016-02-12 NOTE — Assessment & Plan Note (Signed)
Thought related to heavy menstrual bleeding. She had a Hgb of 5.9 in the hospital without obvious sign of bleeding. Ferritin was 3. She received 2 units PRBCs and IV iron transfusion. She states she was unable to afford her oral outpatient iron supplementation. She denies any bleeding with LMP on 01/21/16. She denies any lightheadedness, dizziness, or LOC. She says she has taking Ferrous sulfate before which she was able to tolerate.  No signs of obvious bleeding or symptomatic anemia. Advised patient she can try ferrous sulfate 325 mg if able to tolerate pills and if it is more affordable for her, otherwise may need to consider intermittent IV iron infusions.

## 2016-02-12 NOTE — Assessment & Plan Note (Addendum)
Patient with most recent CD4 count of 30 on 02/03/16 (RNA still pending). She had been represcribed Odefsey by ID 2 months ago which patient was taking sporadically prior to her recent hospitalization. There is concern for resistance as she was off the [facility] for some time and she is to follow up with ID for further management. She was started on PJP and MAC prophylaxis during her admission with oral Bactrim suspension 3 times weekly and oral Azithromycin suspension once weekly. Patient states that she has not taken her Odefsey or Bactrim since discharge as her pharmacy did not have these.  Patient immunosuppressed with esophageal candidiasis and at risk for opportunistic infections. She is advised to [facility] for her Bactrim and Odefsey respectively as they do appear to have been prescribed recently. She is to call us if there are any issues obtaining these medications. She is also advised to call Infectious Disease to schedule a follow up appointment as previously planned. She understands and agrees.

## 2016-02-12 NOTE — Telephone Encounter (Signed)
Spoke with patient on yesterday about [employer] not paying for her Fluconazole liquid medication.  Patient said that this time does not need any and for Pharmacy to hold the order.

## 2016-02-12 NOTE — Assessment & Plan Note (Signed)
Patient admitted from 10/21 - 10/24 for severe esophageal candidiasis throughout the esophagus. She was started on oral fluconazole 400 mg daily suspension for 14-21 day course. She reports adherence and significant improvement in her symptoms without further dysphagia or odynophagia. She is eating well tolerating solids and liquids as well as her current medications.  Symptoms seem to have improved back to baseline. She does have the oral fluconazole suspension which she is taking daily and will complete up to 21 days total course (start 02/04/16).

## 2016-02-13 NOTE — Telephone Encounter (Signed)
Pt was seen 02/11/16.

## 2016-02-15 LAB — HIV-1 RNA ULTRAQUANT REFLEX TO GENTYP+
HIV-1 RNA BY PCR: 43000 copies/mL
HIV-1 RNA QUANT, LOG: 4.633 {Log_copies}/mL

## 2016-02-15 LAB — REFLEX TO GENOSURE(R) MG: HIV GenoSure(R) MG PDF: 0

## 2016-02-18 ENCOUNTER — Telehealth: Payer: Self-pay

## 2016-02-18 NOTE — Progress Notes (Signed)
Internal Medicine Clinic Attending  Case discussed with Dr. Patel,Vishal at the time of the visit.  We reviewed the resident's history and exam and pertinent patient test results.  I agree with the assessment, diagnosis, and plan of care documented in the resident's note.  

## 2016-02-18 NOTE — Telephone Encounter (Signed)
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 She will call tomorrow with a fax #

## 2016-02-18 NOTE — Telephone Encounter (Signed)
rtc to pt, request sent to dr's patel and taylor

## 2016-02-18 NOTE — Telephone Encounter (Signed)
Please call pt back regarding a letter for work.

## 2016-02-19 NOTE — Telephone Encounter (Signed)
I have updated her work letter which is available in Chart Review under the Letters tab.

## 2016-02-19 NOTE — Telephone Encounter (Addendum)
Calling helen about letter

## 2016-02-20 ENCOUNTER — Encounter: Payer: Self-pay | Admitting: Internal Medicine

## 2016-02-21 NOTE — Telephone Encounter (Signed)
Pt has letter

## 2016-03-20 ENCOUNTER — Emergency Department (HOSPITAL_COMMUNITY)
Admission: EM | Admit: 2016-03-20 | Discharge: 2016-03-20 | Disposition: A | Discharge status: Home / Self Care | Payer: Medicaid Other | Attending: Emergency Medicine | Admitting: Emergency Medicine

## 2016-03-20 ENCOUNTER — Emergency Department (HOSPITAL_COMMUNITY): Payer: Medicaid Other

## 2016-03-20 ENCOUNTER — Encounter (HOSPITAL_COMMUNITY): Payer: Self-pay | Admitting: *Deleted

## 2016-03-20 DIAGNOSIS — X58XXXA Exposure to other specified factors, initial encounter: Secondary | ICD-10-CM | POA: Insufficient documentation

## 2016-03-20 DIAGNOSIS — Y939 Activity, unspecified: Secondary | ICD-10-CM | POA: Insufficient documentation

## 2016-03-20 DIAGNOSIS — Y929 Unspecified place or not applicable: Secondary | ICD-10-CM | POA: Insufficient documentation

## 2016-03-20 DIAGNOSIS — S93402A Sprain of unspecified ligament of left ankle, initial encounter: Secondary | ICD-10-CM | POA: Insufficient documentation

## 2016-03-20 DIAGNOSIS — Y999 Unspecified external cause status: Secondary | ICD-10-CM | POA: Insufficient documentation

## 2016-03-20 DIAGNOSIS — Z79899 Other long term (current) drug therapy: Secondary | ICD-10-CM | POA: Insufficient documentation

## 2016-03-20 MED ORDER — IBUPROFEN 600 MG PO TABS: 600.0000 mg | ORAL_TABLET | Freq: Four times a day (QID) | ORAL | 0 refills | Status: DC | PRN

## 2016-03-20 NOTE — ED Triage Notes (Signed)
Pt states she woke up yesterday am with a swollen L ankle.  Denies injury.

## 2016-03-20 NOTE — Discharge Instructions (Signed)
Please read and follow all provided instructions.  Your diagnoses today include:  1. Sprain of left ankle, unspecified ligament, initial encounter     Tests performed today include: Vital signs. See below for your results today.   Medications prescribed:  Take as prescribed   Home care instructions:  Follow any educational materials contained in this packet.  Follow-up instructions: Please follow-up with your primary care provider for further evaluation of symptoms and treatment   Return instructions:  Please return to the Emergency Department if you do not get better, if you get worse, or new symptoms OR  - Fever (temperature greater than 101.58F)  - Bleeding that does not stop with holding pressure to the area    -Severe pain (please note that you may be more sore the day after your accident)  - Chest Pain  - Difficulty breathing  - Severe nausea or vomiting  - Inability to tolerate food and liquids  - Passing out  - Skin becoming red around your wounds  - Change in mental status (confusion or lethargy)  - New numbness or weakness    Please return if you have any other emergent concerns.  Additional Information:  Your vital signs today were: BP 117/88 (BP Location: Right Arm)    Pulse 79    Temp 97.8 F (36.6 C) (Oral)    Resp 16    LMP 03/14/2016    SpO2 100%  If your blood pressure (BP) was elevated above 135/85 this visit, please have this repeated by your doctor within one month. ---------------

## 2016-03-20 NOTE — ED Provider Notes (Signed)
[facility] DEPT Provider Note   CSN: [id number] Arrival date & time: 03/20/16  1222  By signing my name below, I, Shirley White, attest that this documentation has been prepared under the direction and in the presence of Shirley Decamp, PA-C. Electronically Signed: Sonum White, Education administrator. 03/20/16. 12:54 PM.  History   Chief Complaint Chief Complaint  Patient presents with  . Ankle Pain    The history is provided by the patient. No language interpreter was used.    HPI Comments: Shirley White is a 45 y.o. female with past medical history of HIV who presents to the Emergency Department complaining of constant, unchanged left ankle pain that began yesterday morning. She describes her pain as throbbing in nature and is currently an 8/10. She reports associated swelling which has since resolved. She denies known trauma or injuries to the affected area that she knows of. Does ambulate at work and is on her feet all day. She has tried Tylenol and applied Bengay without relief. She denies numbness.   Past Medical History:  Diagnosis Date  . Anemia   . HIV disease [facility])     Patient Active Problem List   Diagnosis Date Noted  . Immunocompromised status associated with infection ([place]) 02/03/2016  . Esophageal candidiasis ([place]) 02/02/2016  . Chest pain 02/02/2016  . AIDS ([place]) 02/02/2016  . Onychomycosis 12/13/2015  . Iron deficiency anemia due to chronic blood loss   . H/O menorrhagia 09/05/2014  . HIV DISEASE 06/04/2006  . Sickle-cell trait ([place]) 06/04/2006    Past Surgical History:  Procedure Laterality Date  . CESAREAN SECTION    . ESOPHAGOGASTRODUODENOSCOPY N/A 02/03/2016   Procedure: ESOPHAGOGASTRODUODENOSCOPY (EGD);  Surgeon: Manus Gunning, MD;  Location: [place] [facility];  Service: Gastroenterology;  Laterality: N/A;    OB History    Gravida Para Term Preterm AB Living   4 4 4          SAB TAB Ectopic Multiple Live Births                   Home Medications     Prior to Admission medications   Medication Sig Start Date End Date Taking? Authorizing Provider  azithromycin (ZITHROMAX) 200 MG/5ML suspension Take 15 mLs (600 mg total) by mouth once a week. 02/05/16   Ledell Noss, MD  emtricitabine-rilpivir-tenofovir AF (ODEFSEY) 200-25-25 MG TABS tablet Take 1 tablet by mouth daily with supper. 12/13/15   Thayer Headings, MD  fluconazole (DIFLUCAN) 10 MG/ML suspension Take 40 mLs (400 mg total) by mouth daily. 02/05/16   Ledell Noss, MD  iron polysaccharides (POLY-IRON 150) 150 MG capsule Take 1 capsule (150 mg total) by mouth daily. 12/13/15   Thayer Headings, MD  sulfamethoxazole-trimethoprim (BACTRIM,SEPTRA) 200-40 MG/5ML suspension Take 20 mLs by mouth 3 (three) times a week. 02/06/16   Ledell Noss, MD  terbinafine (LAMISIL) 1 % cream Apply 1 application topically 2 (two) times daily. Patient not taking: Reported on 02/02/2016 12/13/15   Thayer Headings, MD    Family History Family History  Problem Relation Age of Onset  . Hyperlipidemia Mother   . Cancer Father     Social History Social History  Substance Use Topics  . Smoking status: Never Smoker  . Smokeless tobacco: Never Used  . Alcohol use 0.6 oz/week    1 Cans of beer per week     Comment: occa     Allergies   Patient has no known allergies.   Review of Systems Review  of Systems  Musculoskeletal: Positive for arthralgias and joint swelling.  Neurological: Negative for numbness.     Physical Exam Updated Vital Signs BP 117/88 (BP Location: Right Arm)   Pulse 79   Temp 97.8 F (36.6 C) (Oral)   Resp 16   LMP 03/14/2016   SpO2 100%   Physical Exam  Constitutional: She is oriented to person, place, and time. She appears well-developed and well-nourished.  HENT:  Head: Normocephalic and atraumatic.  Cardiovascular: Normal rate.   Pulmonary/Chest: Effort normal.  Musculoskeletal: Normal range of motion. She exhibits tenderness. She exhibits no edema or deformity.  Tenderness  to palpation below medial malleolus of the left ankle. No swelling appreciated on exam. ROM intact. NVI. Sensation intact.   Neurological: She is alert and oriented to person, place, and time.  Skin: Skin is warm and dry.  Psychiatric: She has a normal mood and affect.  Nursing note and vitals reviewed.  ED Treatments / Results  DIAGNOSTIC STUDIES: Oxygen Saturation is 100% on RA, normal by my interpretation.    COORDINATION OF CARE: 12:57 PM Discussed treatment plan with pt at bedside and pt agreed to plan.   Labs (all labs ordered are listed, but only abnormal results are displayed) Labs Reviewed - No data to display  EKG  EKG Interpretation None       Radiology Dg Ankle Complete Left  Result Date: 03/20/2016 CLINICAL DATA:  Acute onset of medial left ankle pain which began yesterday. No known injuries. EXAM: LEFT ANKLE COMPLETE - 3+ VIEW COMPARISON:  None. FINDINGS: No evidence of acute fracture. Ankle mortise intact with well-preserved joint space. Well-preserved bone mineral density. No intrinsic osseous abnormalities. Very small ankle joint effusion. IMPRESSION: No osseous abnormality.  Very small ankle joint effusion. Electronically Signed   By: Evangeline Dakin M.D.   On: 03/20/2016 13:11    Procedures Procedures (including critical care time)  Medications Ordered in ED Medications - No data to display   Initial Impression / Assessment and Plan / ED Course  I have reviewed the triage vital signs and the nursing notes.  Pertinent labs & imaging results that were available during my care of the patient were reviewed by me and considered in my medical decision making (see chart for details).  Clinical Course    Final Clinical Impressions(s) / ED Diagnoses   {I have reviewed and evaluated the relevant imaging studies.  I have reviewed the relevant previous healthcare records.  I obtained HPI from historian.   ED Course:  Assessment: Patient X-Ray negative for  obvious fracture or dislocation. Likely sprain. Pt advised to follow up with PCP. Patient given brace while in ED, conservative therapy recommended and discussed. Patient will be discharged home & is agreeable with above plan. Returns precautions discussed. Pt appears safe for discharge.  Disposition/Plan:  DC Home Additional Verbal discharge instructions given and discussed with patient.  Pt Instructed to f/u with PCP in the next week for evaluation and treatment of symptoms. Return precautions given Pt acknowledges and agrees with plan  Supervising Physician Carmin Muskrat, MD  Final diagnoses:  Sprain of left ankle, unspecified ligament, initial encounter    New Prescriptions New Prescriptions   No medications on file    I personally performed the services described in this documentation, which was scribed in my presence. The recorded information has been reviewed and is accurate.    Shirley Decamp, PA-C 03/20/16 1322    Carmin Muskrat, MD 03/20/16 [phone]

## 2016-05-06 ENCOUNTER — Other Ambulatory Visit: Payer: Medicaid Other

## 2016-05-20 ENCOUNTER — Encounter: Payer: Self-pay | Admitting: Internal Medicine

## 2016-05-20 ENCOUNTER — Ambulatory Visit (INDEPENDENT_AMBULATORY_CARE_PROVIDER_SITE_OTHER): Payer: Self-pay | Admitting: Internal Medicine

## 2016-05-20 VITALS — BP 132/90 | HR 74 | Temp 97.4°F | Wt 101.0 lb

## 2016-05-20 DIAGNOSIS — D849 Immunodeficiency, unspecified: Secondary | ICD-10-CM

## 2016-05-20 DIAGNOSIS — D500 Iron deficiency anemia secondary to blood loss (chronic): Secondary | ICD-10-CM

## 2016-05-20 DIAGNOSIS — B999 Unspecified infectious disease: Secondary | ICD-10-CM

## 2016-05-20 DIAGNOSIS — B20 Human immunodeficiency virus [HIV] disease: Secondary | ICD-10-CM

## 2016-05-20 MED ORDER — DOLUTEGRAVIR SODIUM 50 MG PO TABS: 50.0000 mg | ORAL_TABLET | Freq: Every day | ORAL | 5 refills | Status: DC

## 2016-05-20 MED ORDER — EMTRICITABINE-TENOFOVIR AF 200-25 MG PO TABS: 1.0000 | ORAL_TABLET | Freq: Every day | ORAL | 5 refills | Status: DC

## 2016-05-20 NOTE — Progress Notes (Signed)
CC: Follow up for HIV  Interval history: Comes in for follow up after missing previous appointments off of medication.  She previously was on Complera and I had her start [place] in August 2017 and she took it for a very short time and stopped due to n/v.  She never called or returned.   She comes in now and wants to get back on medication.  She feels she is now ready.  She is taking Bactrim prophylaxis.  Some weight loss, no diarrhea.    Prior to Admission medications   Medication Sig Start Date End Date Taking? Authorizing Provider  fluconazole (DIFLUCAN) 10 MG/ML suspension Take 40 mLs (400 mg total) by mouth daily. 02/05/16  Yes Ledell Noss, MD  iron polysaccharides (POLY-IRON 150) 150 MG capsule Take 1 capsule (150 mg total) by mouth daily. 12/13/15  Yes Thayer Headings, MD  sulfamethoxazole-trimethoprim (BACTRIM,SEPTRA) 200-40 MG/5ML suspension Take 20 mLs by mouth 3 (three) times a week. 02/06/16  Yes Ledell Noss, MD  dolutegravir (TIVICAY) 50 MG tablet Take 1 tablet (50 mg total) by mouth daily. 05/20/16   Thayer Headings, MD  emtricitabine-tenofovir AF (DESCOVY) 200-25 MG tablet Take 1 tablet by mouth daily. 05/20/16   Thayer Headings, MD    Review of Systems Constitutional: negative for fatigue and malaise Gastrointestinal: negative for diarrhea Integument/breast: negative for rash All other systems reviewed and are negative    Physical Exam: CONSTITUTIONAL:in no apparent distress  Vitals:   05/20/16 0852  BP: 132/90  Pulse: 74  Temp: 97.4 F (36.3 C)   Eyes: anicteric HENT: no thrush, no cervical lymphadenopathy Respiratory: Normal respiratory effort; CTA B GI: soft, nt, nd  Lab Results  Component Value Date   HIV1RNAQUANT 20,410 (H) 12/07/2015   HIV1RNAQUANT 18,268 (H) 08/21/2015   HIV1RNAQUANT 12,080 (H) 11/14/2014   No components found for: HIV1GENOTYPRPLUS No components found for: THELPERCELL  SH: denies drug use

## 2016-05-20 NOTE — Assessment & Plan Note (Signed)
I will start her on Tivicay with Descovy with a higher barrier to resistance.  She knows to call before stopping or if there are any issues.  rtc 4 weeks for labs, 5-6 weeks with me

## 2016-05-20 NOTE — Assessment & Plan Note (Signed)
I encouraged her to continue taking iron

## 2016-05-20 NOTE — Assessment & Plan Note (Signed)
She will continue on Bactrim prophylaxis

## 2016-05-21 IMAGING — DX DG CHEST 2V
2 series · 2 of 2 positions shown · non-contrast
Comparison: 03/15/2015 and 11/16/2014 radiographs.

CLINICAL DATA: Chest pain for 2 days.

EXAM:
CHEST  2 VIEW

[w chest pa]
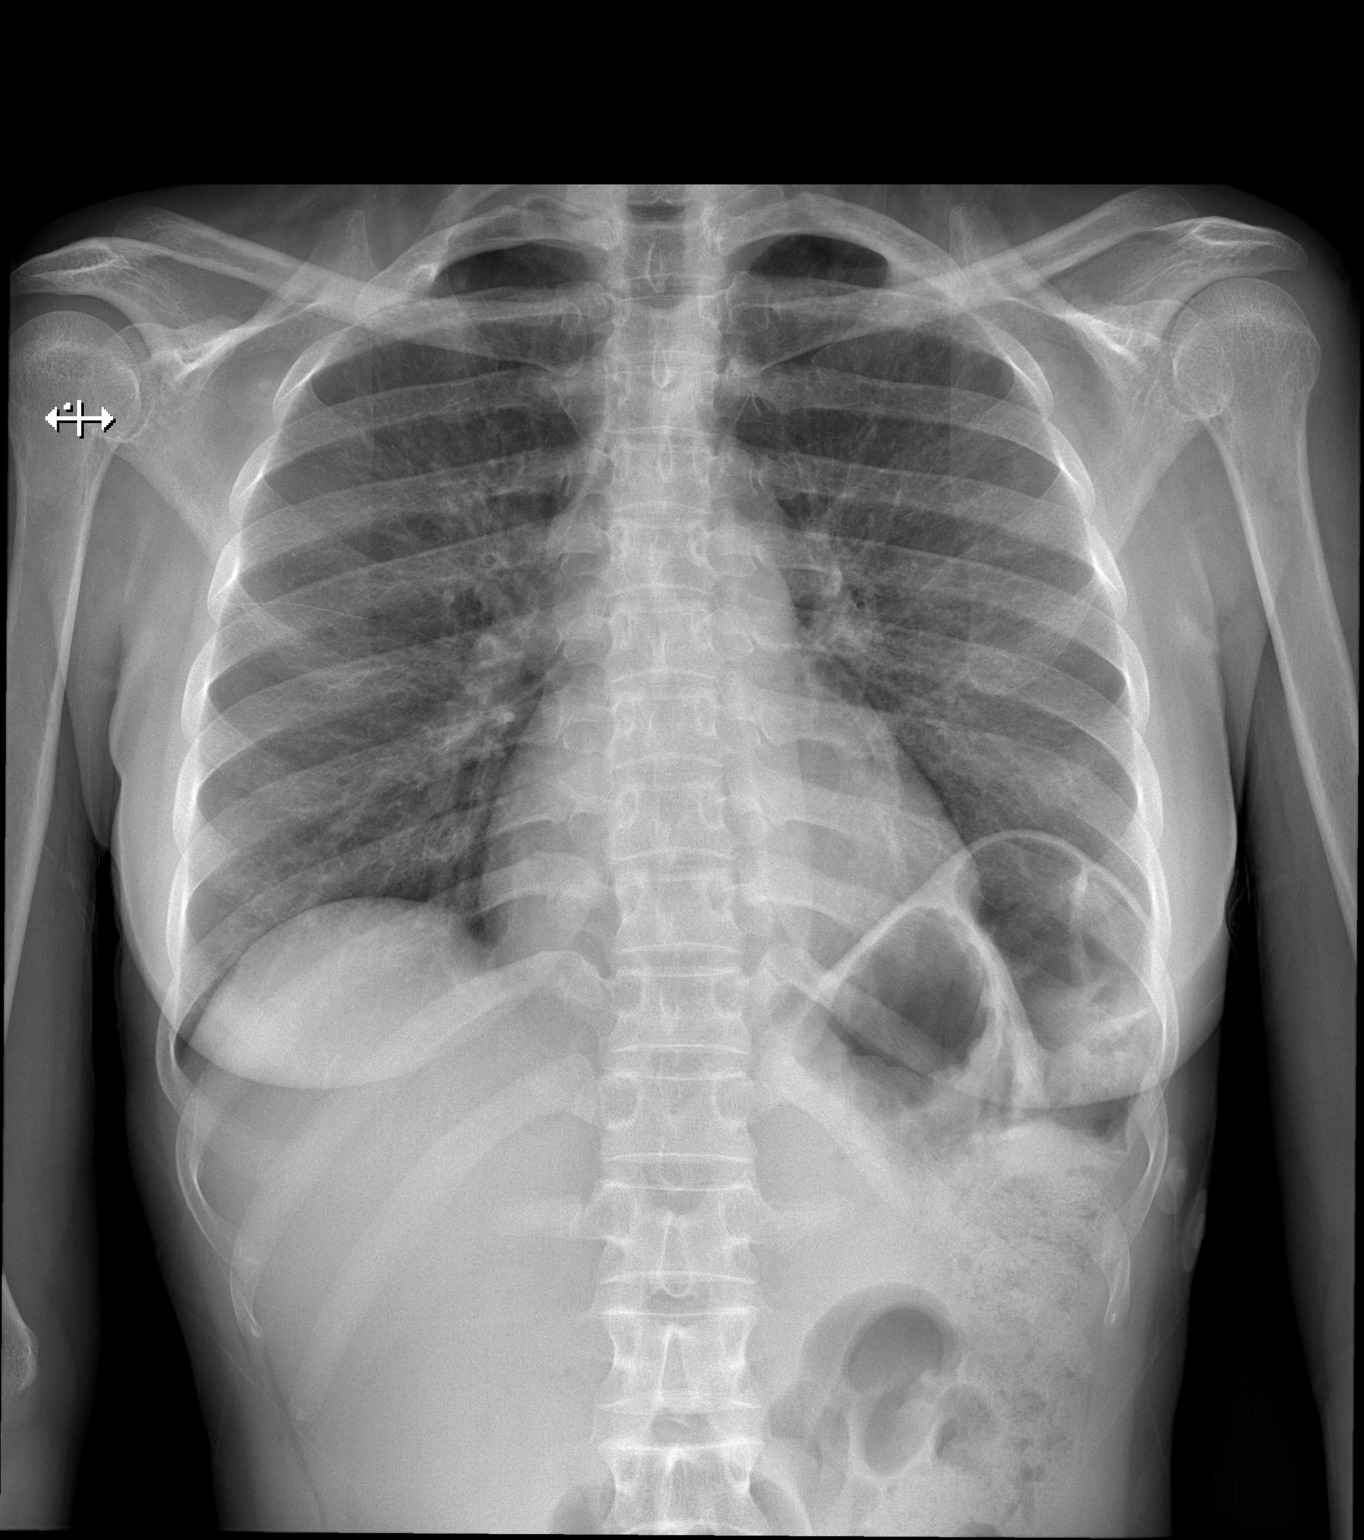

[w chest lat]
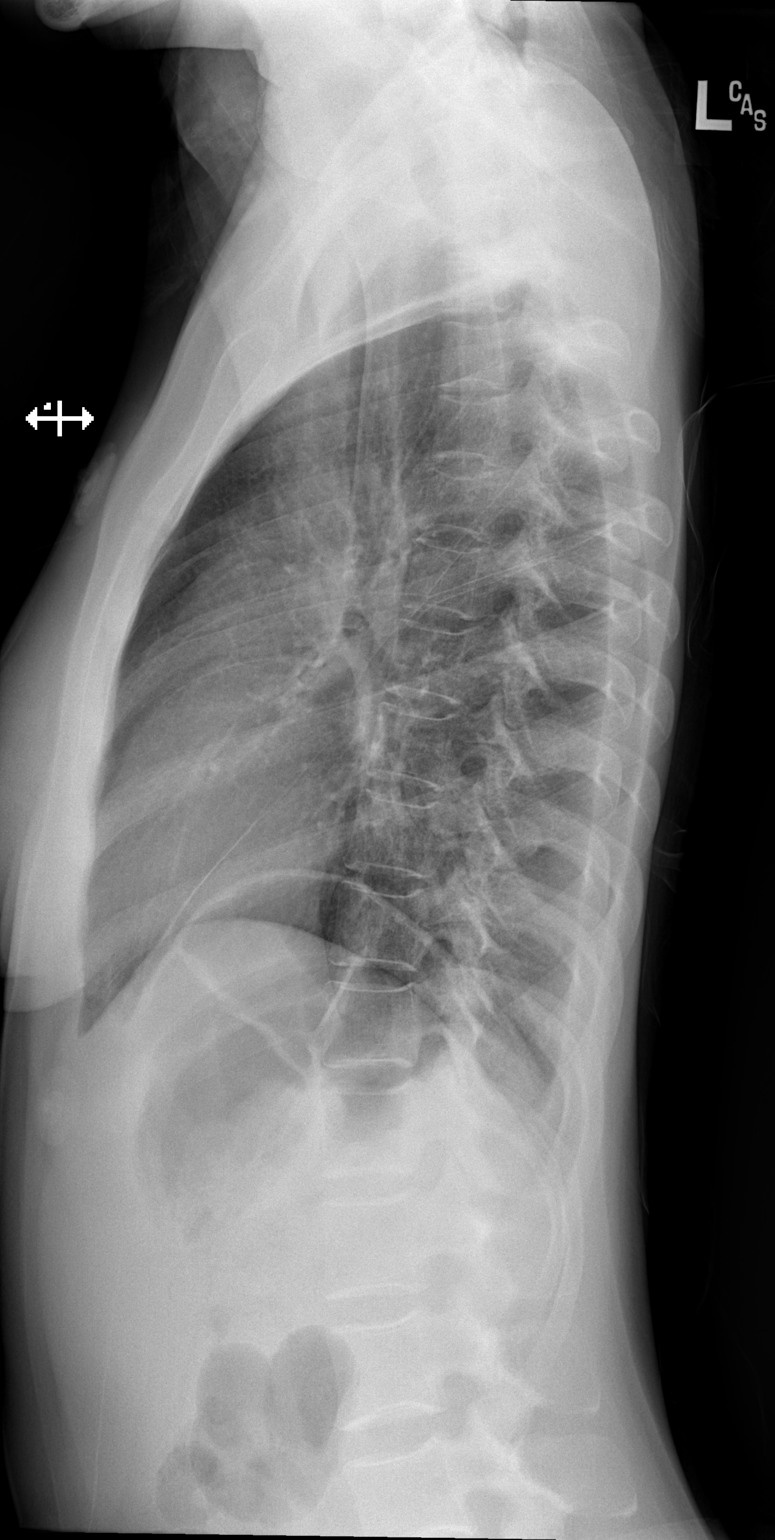

[2 of 2 positions shown; findings below may reference images not displayed]

FINDINGS: There are lower lung volumes with mild central airway thickening. No
edema, confluent airspace opacity, pleural effusion or pneumothorax
is demonstrated. The heart size and mediastinal contours are stable.
The bones appear unchanged with a right-sided cervical rib.
IMPRESSION: Suboptimal inspiration with central airway thickening suggesting
bronchitis. No evidence of pneumonia.

## 2016-06-19 ENCOUNTER — Encounter: Payer: Medicaid Other | Admitting: Internal Medicine

## 2016-07-03 ENCOUNTER — Ambulatory Visit: Payer: Medicaid Other | Admitting: Internal Medicine

## 2016-07-15 ENCOUNTER — Ambulatory Visit (HOSPITAL_COMMUNITY)
Admission: EM | Admit: 2016-07-15 | Discharge: 2016-07-15 | Disposition: A | Discharge status: Home / Self Care | Payer: Medicaid Other | Attending: Family Medicine | Admitting: Family Medicine

## 2016-07-15 ENCOUNTER — Encounter (HOSPITAL_COMMUNITY): Payer: Self-pay | Admitting: Emergency Medicine

## 2016-07-15 DIAGNOSIS — R112 Nausea with vomiting, unspecified: Secondary | ICD-10-CM

## 2016-07-15 DIAGNOSIS — L858 Other specified epidermal thickening: Secondary | ICD-10-CM

## 2016-07-15 DIAGNOSIS — R21 Rash and other nonspecific skin eruption: Secondary | ICD-10-CM

## 2016-07-15 DIAGNOSIS — K529 Noninfective gastroenteritis and colitis, unspecified: Secondary | ICD-10-CM

## 2016-07-15 NOTE — ED Provider Notes (Addendum)
[name]    CSN: [id number] Arrival date & time: 07/15/16  1220     History   Chief Complaint Chief Complaint  Patient presents with  . Nausea  . Rash    HPI Shirley White is a 46 y.o. female.   This a 46 year old woman who presents with nausea. She developed these symptoms Dob[place] night and was vomiting on Saturday and Sunday but since then has felt better. She works at [name].  Patient started a new HIV medicine back in February. The nausea and vomiting she experienced last [place] and Saturday the first signs of any gastrointestinal problems.  Patient denies any abdominal pain or fever. She is here just for a note to go back to work.  Patient also notes a fine papular rash on her chest and face      Past Medical History:  Diagnosis Date  . Anemia   . HIV disease [facility])     Patient Active Problem List   Diagnosis Date Noted  . Immunocompromised status associated with infection ([place]) 02/03/2016  . Esophageal candidiasis ([place]) 02/02/2016  . AIDS ([place]) 02/02/2016  . Onychomycosis 12/13/2015  . Iron deficiency anemia due to chronic blood loss   . H/O menorrhagia 09/05/2014  . Human immunodeficiency virus I infection ([place]) 06/04/2006  . Sickle-cell trait ([place]) 06/04/2006    Past Surgical History:  Procedure Laterality Date  . CESAREAN SECTION    . ESOPHAGOGASTRODUODENOSCOPY N/A 02/03/2016   Procedure: ESOPHAGOGASTRODUODENOSCOPY (EGD);  Surgeon: Manus Gunning, MD;  Location: [place];  Service: Gastroenterology;  Laterality: N/A;    OB History    Gravida Para Term Preterm AB Living   4 4 4          SAB TAB Ectopic Multiple Live Births                   Home Medications    Prior to Admission medications   Medication Sig Start Date End Date Taking? Authorizing Provider  dolutegravir (TIVICAY) 50 MG tablet Take 1 tablet (50 mg total) by mouth daily. 05/20/16  Yes Thayer Headings, MD  emtricitabine-tenofovir AF (DESCOVY) 200-25  MG tablet Take 1 tablet by mouth daily. 05/20/16  Yes Thayer Headings, MD    Family History Family History  Problem Relation Age of Onset  . Hyperlipidemia Mother   . Cancer Father     Social History Social History  Substance Use Topics  . Smoking status: Never Smoker  . Smokeless tobacco: Never Used  . Alcohol use 0.6 oz/week    1 Cans of beer per week     Comment: occa     Allergies   Patient has no known allergies.   Review of Systems Review of Systems  Gastrointestinal: Positive for nausea and vomiting.  Skin: Positive for rash.  All other systems reviewed and are negative.    Physical Exam Triage Vital Signs ED Triage Vitals  Enc Vitals Group     BP 07/15/16 1236 113/68     Pulse Rate 07/15/16 1236 89     Resp 07/15/16 1236 16     Temp 07/15/16 1236 98.4 F (36.9 C)     Temp Source 07/15/16 1236 Oral     SpO2 07/15/16 1236 99 %     Weight --      Height --      Head Circumference --      Peak Flow --      Pain Score 07/15/16 1228  0     Pain Loc --      Pain Edu? --      Excl. in [place]? --    No data found.   Updated Vital Signs BP 113/68 (BP Location: Right Arm)   Pulse 89   Temp 98.4 F (36.9 C) (Oral)   Resp 16   SpO2 99%    Physical Exam  Constitutional: She is oriented to person, place, and time. She appears well-developed and well-nourished.  HENT:  Head: Normocephalic.  Right Ear: External ear normal.  Left Ear: External ear normal.  Mouth/Throat: Oropharynx is clear and moist.  Eyes: Conjunctivae are normal. Pupils are equal, round, and reactive to light.  Neck: Normal range of motion. Neck supple.  Cardiovascular: Normal rate and regular rhythm.   Murmur heard. Pulmonary/Chest: Effort normal and breath sounds normal.  Abdominal: Soft. Bowel sounds are normal. There is no tenderness.  Musculoskeletal: Normal range of motion.  Neurological: She is alert and oriented to person, place, and time.  Skin: Skin is warm and dry.  Fine  macular rash on the chest and face consistent with keratosis pilaris  Nursing note and vitals reviewed.    UC Treatments / Results  Labs (all labs ordered are listed, but only abnormal results are displayed) Labs Reviewed - No data to display  EKG  EKG Interpretation None       Radiology No results found.  Procedures Procedures (including critical care time)  Medications Ordered in UC Medications - No data to display   Initial Impression / Assessment and Plan / UC Course  I have reviewed the triage vital signs and the nursing notes.  Pertinent labs & imaging results that were available during my care of the patient were reviewed by me and considered in my medical decision making (see chart for details).     Final Clinical Impressions(s) / UC Diagnoses   Final diagnoses:  Gastroenteritis  Keratosis pilaris    New Prescriptions New Prescriptions   No medications on file     Robyn Haber, MD 07/15/16 1246    Robyn Haber, MD 07/15/16 1248

## 2016-07-15 NOTE — ED Triage Notes (Addendum)
The patient presented to the [facility] with a complaint of being nauseous  and vomiting 3 days ago in which she missed work and now needs a doctor's note. The patient also stated that she had a rash on her chest we could check while she was here.

## 2016-08-28 ENCOUNTER — Encounter (INDEPENDENT_AMBULATORY_CARE_PROVIDER_SITE_OTHER): Payer: Self-pay

## 2016-08-28 ENCOUNTER — Ambulatory Visit (INDEPENDENT_AMBULATORY_CARE_PROVIDER_SITE_OTHER): Payer: Self-pay | Admitting: Pharmacist Clinician (PhC)/ Clinical Pharmacy Specialist

## 2016-08-28 ENCOUNTER — Encounter: Payer: Self-pay | Admitting: Internal Medicine

## 2016-08-28 ENCOUNTER — Ambulatory Visit (INDEPENDENT_AMBULATORY_CARE_PROVIDER_SITE_OTHER): Payer: Self-pay | Admitting: Internal Medicine

## 2016-08-28 VITALS — BP 131/77 | HR 89 | Temp 98.8°F | Wt 100.7 lb

## 2016-08-28 DIAGNOSIS — B20 Human immunodeficiency virus [HIV] disease: Secondary | ICD-10-CM

## 2016-08-28 DIAGNOSIS — Z79899 Other long term (current) drug therapy: Secondary | ICD-10-CM

## 2016-08-28 DIAGNOSIS — D500 Iron deficiency anemia secondary to blood loss (chronic): Secondary | ICD-10-CM

## 2016-08-28 DIAGNOSIS — Z0000 Encounter for general adult medical examination without abnormal findings: Secondary | ICD-10-CM | POA: Insufficient documentation

## 2016-08-28 NOTE — Assessment & Plan Note (Signed)
Patient has a history of iron deficiency anemia due to chronic blood loss. I will order a CBC today to evaluate her hemoglobin and hematocrit. If they're low we'll continue to recommend oral iron supplementation. -- CBC

## 2016-08-28 NOTE — Patient Instructions (Signed)
It was a pleasure seeing you today. Thank you for choosing [facility] for your healthcare needs.   Today we have done blood work to check her iron level. I will call you with the results.  Please ensure you see the infectious disease doctors today and follow their instructions.  Please return to clinic as needed.

## 2016-08-28 NOTE — Progress Notes (Signed)
She was here to ask if she can get her records. She told me that she has been taking both meds rather than one. She will be moving to [place] next week.

## 2016-08-28 NOTE — Progress Notes (Signed)
    CC: Follow-up of HIV and general medical care HPI: [name] is a 46 y.o. female with a past medical history as listed below who presents for follow-up of HIV and general medical care.  Past Medical History:  Diagnosis Date  . Anemia   . HIV disease ([place])      Review of Systems: She denies chest pain or shortness of breath. She denies nausea, vomiting or abdominal pain. She denies cough and she denies polyuria or dysuria. Physical Exam: Vitals:   08/28/16 1342  BP: 131/77  Pulse: 89  Temp: 98.8 F (37.1 C)  TempSrc: Oral  SpO2: 100%  Weight: 100 lb 11.2 oz (45.7 kg)   BP 131/77 (BP Location: Left Arm, Patient Position: Sitting, Cuff Size: Small)   Pulse 89   Temp 98.8 F (37.1 C) (Oral)   Wt 100 lb 11.2 oz (45.7 kg)   LMP 08/12/2016   SpO2 100%   BMI 19.03 kg/m  General appearance: alert and cooperative Lungs: clear to auscultation bilaterally Heart: regular rate and rhythm, S1, S2 normal, no murmur, click, rub or gallop Abdomen: soft, non-tender; bowel sounds normal; no masses,  no organomegaly Extremities: extremities normal, atraumatic, no cyanosis or edema Skin: Several darkly pigmented lesions on the patient's lower extremities bilaterally that looked like old healing wounds.  Assessment & Plan:  See encounters tab for problem based medical decision making. Patient discussed with Dr. Daryll Drown  Signed: Ophelia Shoulder, MD 08/28/2016, 1:52 PM  Pager: [phone]

## 2016-08-28 NOTE — Progress Notes (Signed)
Pt came in today to get clarification on medication and which ones she's supposed to be taking. Upon review of PT's chart and medication chart in Pt room she confirmed that she's been taking Tivicay and Descovy and prescribed. Pt stated that she will be moving out of state soon and needs the records sent to new office however her phone died during the visit and she was unable to remember the address and phone number of new office. Release of record was also partially filled out and signed. Called Pt back after she left to remind her to please give [place] all information needed to insure she gets all the records she may need for her move. Pt states that she has enough medication to last her until her next appointment with new office on September 12, 2016.

## 2016-08-28 NOTE — Assessment & Plan Note (Addendum)
The patient has a history of HIV and AIDS. She currently follows with ID. She missed her last appointment. Per the notes available in Epic the patient is to be on 2 antiretroviral drugs and Bactrim for prophylaxis. However, speaking with the patient today she says she is only on a single medication to treat her HIV. She also plans to move to [facility] [place] [facility] next week. I think she would benefit from a stat appointment with infectious disease before she moves to clarify her medications and ensure she understands which medication she is supposed to be taking. -- We will defer to infectious disease  ADDENDUM Patient says she is going to the infectious disease clinic directly from our appointment. She is going to get records to take it with her when she moves. I discussed with her that from their notes it looks like she is on 2 medications plus Bactrim. She thinks she is only on one medication. She said she would ask to speak with a doctor while there for clarification. I stressed the importance of this. She endorsed understanding.

## 2016-08-28 NOTE — Assessment & Plan Note (Signed)
The patient is overdue for Pap smear. I discussed this with her in clinic today. She deferred this exam and states she will get this done when she moved to [place] in the next few weeks. -- Encourage she has this done after she relocates

## 2016-08-29 ENCOUNTER — Telehealth: Payer: Self-pay | Admitting: Internal Medicine

## 2016-08-29 LAB — CBC
HEMOGLOBIN: 10.7 g/dL — AB (ref 11.1–15.9)
Hematocrit: 33.2 % — ABNORMAL LOW (ref 34.0–46.6)
MCH: 24.6 pg — AB (ref 26.6–33.0)
MCHC: 32.2 g/dL (ref 31.5–35.7)
MCV: 76 fL — AB (ref 79–97)
NRBC: 1 % — AB (ref 0–0)
PLATELETS: 294 10*3/uL (ref 150–379)
RBC: 4.35 x10E6/uL (ref 3.77–5.28)
RDW: 16.4 % — AB (ref 12.3–15.4)
WBC: 4 10*3/uL (ref 3.4–10.8)

## 2016-08-29 NOTE — Telephone Encounter (Signed)
I called and spoke with the patient this morning regarding her lab results. I informed her that her hemoglobin is still low although it does look improved from the last 2 data points we have. She was glad to know her hemoglobin is improving. I informed her that it is still not in the normal range and that she should continue to take iron supplementation. She said she would try to get some iron supplementation when she is able to afford it.

## 2016-08-29 NOTE — Progress Notes (Signed)
Internal Medicine Clinic Attending  Case discussed with Dr. Taylor at the time of the visit.  We reviewed the resident's history and exam and pertinent patient test results.  I agree with the assessment, diagnosis, and plan of care documented in the resident's note. 

## 2016-09-22 ENCOUNTER — Encounter: Payer: Self-pay | Admitting: *Deleted

## 2016-10-03 ENCOUNTER — Telehealth: Payer: Self-pay | Admitting: *Deleted

## 2016-10-03 NOTE — Telephone Encounter (Signed)
Has the patient moved to [place] and obtained care?  Does patient have new MD information in [place] to fax release of information?

## 2016-10-20 ENCOUNTER — Emergency Department (HOSPITAL_COMMUNITY)
Admission: EM | Admit: 2016-10-20 | Discharge: 2016-10-21 | Disposition: A | Discharge status: Home / Self Care | Payer: Medicaid Other | Attending: Emergency Medicine | Admitting: Emergency Medicine

## 2016-10-20 ENCOUNTER — Encounter (HOSPITAL_COMMUNITY): Payer: Self-pay

## 2016-10-20 DIAGNOSIS — E876 Hypokalemia: Secondary | ICD-10-CM | POA: Insufficient documentation

## 2016-10-20 DIAGNOSIS — R112 Nausea with vomiting, unspecified: Secondary | ICD-10-CM | POA: Insufficient documentation

## 2016-10-20 DIAGNOSIS — Z79899 Other long term (current) drug therapy: Secondary | ICD-10-CM | POA: Insufficient documentation

## 2016-10-20 DIAGNOSIS — N3000 Acute cystitis without hematuria: Secondary | ICD-10-CM

## 2016-10-20 DIAGNOSIS — D649 Anemia, unspecified: Secondary | ICD-10-CM

## 2016-10-20 LAB — COMPREHENSIVE METABOLIC PANEL
ALK PHOS: 64 U/L (ref 38–126)
ALT: 10 U/L — ABNORMAL LOW (ref 14–54)
ANION GAP: 13 (ref 5–15)
AST: 22 U/L (ref 15–41)
Albumin: 3.1 g/dL — ABNORMAL LOW (ref 3.5–5.0)
BUN: 6 mg/dL (ref 6–20)
CALCIUM: 8.8 mg/dL — AB (ref 8.9–10.3)
CO2: 24 mmol/L (ref 22–32)
Chloride: 101 mmol/L (ref 101–111)
Creatinine, Ser: 0.65 mg/dL (ref 0.44–1.00)
GLUCOSE: 82 mg/dL (ref 65–99)
POTASSIUM: 2.7 mmol/L — AB (ref 3.5–5.1)
Sodium: 138 mmol/L (ref 135–145)
Total Bilirubin: 1.1 mg/dL (ref 0.3–1.2)
Total Protein: 9.1 g/dL — ABNORMAL HIGH (ref 6.5–8.1)

## 2016-10-20 LAB — CBC WITH DIFFERENTIAL/PLATELET
BASOS ABS: 0 10*3/uL (ref 0.0–0.1)
Basophils Relative: 0 %
EOS ABS: 0.1 10*3/uL (ref 0.0–0.7)
Eosinophils Relative: 2 %
HEMATOCRIT: 31.9 % — AB (ref 36.0–46.0)
HEMOGLOBIN: 9.7 g/dL — AB (ref 12.0–15.0)
LYMPHS PCT: 26 %
Lymphs Abs: 1 10*3/uL (ref 0.7–4.0)
MCH: 21.7 pg — ABNORMAL LOW (ref 26.0–34.0)
MCHC: 30.4 g/dL (ref 30.0–36.0)
MCV: 71.5 fL — ABNORMAL LOW (ref 78.0–100.0)
MONOS PCT: 14 %
Monocytes Absolute: 0.6 10*3/uL (ref 0.1–1.0)
Neutro Abs: 2.3 10*3/uL (ref 1.7–7.7)
Neutrophils Relative %: 58 %
Platelets: 431 10*3/uL — ABNORMAL HIGH (ref 150–400)
RBC: 4.46 MIL/uL (ref 3.87–5.11)
RDW: 19.2 % — AB (ref 11.5–15.5)
WBC: 4 10*3/uL (ref 4.0–10.5)

## 2016-10-20 LAB — CBC
HCT: 31 % — ABNORMAL LOW (ref 36.0–46.0)
Hemoglobin: 9.5 g/dL — ABNORMAL LOW (ref 12.0–15.0)
MCH: 21.8 pg — AB (ref 26.0–34.0)
MCHC: 30.6 g/dL (ref 30.0–36.0)
MCV: 71.3 fL — AB (ref 78.0–100.0)
Platelets: 472 10*3/uL — ABNORMAL HIGH (ref 150–400)
RBC: 4.35 MIL/uL (ref 3.87–5.11)
RDW: 18.6 % — AB (ref 11.5–15.5)
WBC: 6.1 10*3/uL (ref 4.0–10.5)

## 2016-10-20 LAB — I-STAT BETA HCG BLOOD, ED (MC, WL, AP ONLY)

## 2016-10-20 LAB — LIPASE, BLOOD: LIPASE: 31 U/L (ref 11–51)

## 2016-10-20 MED ORDER — SODIUM CHLORIDE 0.9 % IV BOLUS (SEPSIS)
1000.0000 mL | Freq: Once | INTRAVENOUS | Status: AC
Start: 2016-10-20 — End: 2016-10-20
  Administered 2016-10-20: 1000 mL via INTRAVENOUS

## 2016-10-20 MED ORDER — PANTOPRAZOLE SODIUM 40 MG IV SOLR
40.0000 mg | Freq: Once | INTRAVENOUS | Status: AC
  Administered 2016-10-20: 40 mg via INTRAVENOUS
  Filled 2016-10-20: qty 40

## 2016-10-20 MED ORDER — ONDANSETRON HCL 4 MG/2ML IJ SOLN
4.0000 mg | Freq: Once | INTRAMUSCULAR | Status: AC
  Administered 2016-10-20: 4 mg via INTRAVENOUS
  Filled 2016-10-20: qty 2

## 2016-10-20 MED ORDER — GI COCKTAIL ~~LOC~~
30.0000 mL | Freq: Once | ORAL | Status: AC
  Administered 2016-10-20: 30 mL via ORAL
  Filled 2016-10-20: qty 30

## 2016-10-20 MED ORDER — POTASSIUM CHLORIDE 10 MEQ/100ML IV SOLN
10.0000 meq | INTRAVENOUS | Status: AC
  Administered 2016-10-20 (×2): 10 meq via INTRAVENOUS
  Filled 2016-10-20 (×2): qty 100

## 2016-10-20 NOTE — ED Notes (Signed)
IV team at bedside 

## 2016-10-20 NOTE — ED Notes (Signed)
Pt aware that urine specimen needed but states she is unable to urinate at this time.

## 2016-10-20 NOTE — ED Triage Notes (Addendum)
Pt endorses vomiting x 2 weeks. Pt is HIV positive and states that she just moved back from [place]. Pt has not been taking hiv meds. VSS. Pt also states she has a hx of blood transfusions and states "I think I might need one"

## 2016-10-20 NOTE — ED Notes (Signed)
Unable to give urine sample at this time.  

## 2016-10-20 NOTE — ED Provider Notes (Signed)
[facility] DEPT Provider Note   CSN: [id number] Arrival date & time: 10/20/16  1232    History   Chief Complaint Chief Complaint  Patient presents with  . Emesis    HPI Shirley White is a 46 y.o. female.  46 year old female with hx of HIV (CD4 30 in October 2017), anemia, and medication noncompliance presents to the emergency department for evaluation of nausea and vomiting. Symptoms have been worsening over the past 2 weeks, the patient reports a degree of chronic nausea and vomiting. Symptoms worsen following discontinuation of her HIV medication while she was in [place]. Patient also reports some central chest discomfort and sporadic shortness of breath. She notes that her symptoms feel similar to when she was diagnosed with esophageal candidiasis. She states that she recently moved back from [place] because "no one would treat me". She has also been off of her prophylactic Bactrim. No associated fevers or bowel changes. No hx of abdominal surgeries.   The history is provided by the patient. No language interpreter was used.  Emesis      Past Medical History:  Diagnosis Date  . Anemia   . HIV disease [facility])     Patient Active Problem List   Diagnosis Date Noted  . Routine health maintenance 08/28/2016  . Immunocompromised status associated with infection ([place]) 02/03/2016  . Esophageal candidiasis ([place]) 02/02/2016  . AIDS ([place]) 02/02/2016  . Onychomycosis 12/13/2015  . Iron deficiency anemia due to chronic blood loss   . H/O menorrhagia 09/05/2014  . Human immunodeficiency virus I infection ([place]) 06/04/2006  . Sickle-cell trait ([place]) 06/04/2006    Past Surgical History:  Procedure Laterality Date  . CESAREAN SECTION    . ESOPHAGOGASTRODUODENOSCOPY N/A 02/03/2016   Procedure: ESOPHAGOGASTRODUODENOSCOPY (EGD);  Surgeon: Manus Gunning, MD;  Location: [facility];  Service: Gastroenterology;  Laterality: N/A;    OB History    Gravida Para Term  Preterm AB Living   4 4 4          SAB TAB Ectopic Multiple Live Births                   Home Medications    Prior to Admission medications   Medication Sig Start Date End Date Taking? Authorizing Provider  dolutegravir (TIVICAY) 50 MG tablet Take 1 tablet (50 mg total) by mouth daily. 05/20/16  Yes Comer, Okey Regal, MD  emtricitabine-tenofovir AF (DESCOVY) 200-25 MG tablet Take 1 tablet by mouth daily. 05/20/16  Yes Comer, Okey Regal, MD  nystatin (MYCOSTATIN) 100000 UNIT/ML suspension Take 5 mLs (500,000 Units total) by mouth 4 (four) times daily. 10/21/16   Antonietta Breach, PA-C  omeprazole (PRILOSEC) 20 MG capsule Take 1 capsule (20 mg total) by mouth daily. 10/21/16   Antonietta Breach, PA-C  ondansetron (ZOFRAN ODT) 4 MG disintegrating tablet Take 1 tablet (4 mg total) by mouth every 8 (eight) hours as needed for nausea or vomiting. 10/21/16   Antonietta Breach, PA-C  sulfamethoxazole-trimethoprim (BACTRIM,SEPTRA) 200-40 MG/5ML suspension Take 20 mLs by mouth 3 (three) times a week. 10/22/16   Antonietta Breach, PA-C    Family History Family History  Problem Relation Age of Onset  . Hyperlipidemia Mother   . Cancer Father     Social History Social History  Substance Use Topics  . Smoking status: Never Smoker  . Smokeless tobacco: Never Used  . Alcohol use 0.6 oz/week    1 Cans of beer per week     Comment: occa  Allergies   Patient has no known allergies.   Review of Systems Review of Systems  Gastrointestinal: Positive for vomiting.  Ten systems reviewed and are negative for acute change, except as noted in the HPI.    Physical Exam Updated Vital Signs BP (!) 156/80   Pulse 71   Temp 99.1 F (37.3 C) (Oral)   Resp 11   Ht 5\' 1"  (1.549 m)   Wt 43.1 kg (95 lb)   LMP 09/20/2016 (Approximate)   SpO2 100%   BMI 17.95 kg/m   Physical Exam  Constitutional: She is oriented to person, place, and time. She appears well-developed and well-nourished. No distress.  Chronically  thin/frail appearing. Nontoxic.  HENT:  Head: Normocephalic and atraumatic.  Eyes: Conjunctivae and EOM are normal. No scleral icterus.  Neck: Normal range of motion.  Cardiovascular: Normal rate, regular rhythm and intact distal pulses.   Pulmonary/Chest: Effort normal. No respiratory distress. She has no wheezes. She has no rales.  Respirations even and unlabored. Chest expansion symmetric.  Abdominal: Soft. She exhibits no distension. There is no tenderness. There is no guarding.  Soft, nontender, nondistended abdomen.  Musculoskeletal: Normal range of motion.  Neurological: She is alert and oriented to person, place, and time. She exhibits normal muscle tone. Coordination normal.  GCS 15. Patient moving all extremities.  Skin: Skin is warm and dry. No rash noted. She is not diaphoretic. No erythema. No pallor.  Psychiatric: She has a normal mood and affect. Her behavior is normal.  Nursing note and vitals reviewed.    ED Treatments / Results  Labs (all labs ordered are listed, but only abnormal results are displayed) Labs Reviewed  CBC - Abnormal; Notable for the following:       Result Value   Hemoglobin 9.5 (*)    HCT 31.0 (*)    MCV 71.3 (*)    MCH 21.8 (*)    RDW 18.6 (*)    Platelets 472 (*)    All other components within normal limits  URINALYSIS, ROUTINE W REFLEX MICROSCOPIC - Abnormal; Notable for the following:    APPearance CLOUDY (*)    Ketones, ur 5 (*)    Protein, ur 30 (*)    Nitrite POSITIVE (*)    Leukocytes, UA LARGE (*)    Bacteria, UA MANY (*)    Squamous Epithelial / LPF 0-5 (*)    All other components within normal limits  COMPREHENSIVE METABOLIC PANEL - Abnormal; Notable for the following:    Potassium 2.7 (*)    Calcium 8.8 (*)    Total Protein 9.1 (*)    Albumin 3.1 (*)    ALT 10 (*)    All other components within normal limits  CBC WITH DIFFERENTIAL/PLATELET - Abnormal; Notable for the following:    Hemoglobin 9.7 (*)    HCT 31.9 (*)     MCV 71.5 (*)    MCH 21.7 (*)    RDW 19.2 (*)    Platelets 431 (*)    All other components within normal limits  URINE CULTURE  LIPASE, BLOOD  I-STAT BETA HCG BLOOD, ED ([facility], [facility], [facility] ONLY)    EKG  EKG Interpretation None       Radiology No results found.  Procedures Procedures (including critical care time)  Medications Ordered in ED Medications  pantoprazole (PROTONIX) injection 40 mg (40 mg Intravenous Given 10/20/16 2218)  sodium chloride 0.9 % bolus 1,000 mL (0 mLs Intravenous Stopped 10/20/16 2259)  ondansetron (ZOFRAN) injection  4 mg (4 mg Intravenous Given 10/20/16 2219)  gi cocktail (Maalox,Lidocaine,Donnatal) (30 mLs Oral Given 10/20/16 2219)  potassium chloride 10 mEq in 100 mL IVPB (0 mEq Intravenous Stopped 10/21/16 0043)     Initial Impression / Assessment and Plan / ED Course  I have reviewed the triage vital signs and the nursing notes.  Pertinent labs & imaging results that were available during my care of the patient were reviewed by me and considered in my medical decision making (see chart for details).     46 year old female presents for nausea and vomiting. Symptoms feel c/w past episodes of candidal esophagitis. Patient with known HIV; off medications during temporary move to [place]. Patient afebrile and nontoxic. Nausea and pain improved with IV and oral medications. Patient now tolerating POs without difficulty. Blood work reassuring; no leukocytosis and anemia at baseline. Potassium replaced with K-rider x 2. I have encouraged the patient to follow-up with Dr. Linus Salmons to restart her HIV medications. Will also place back on Bactrim prophylaxis. Return precautions given at discharge. Patient agreeable to plan with no unaddressed concerns.   Final Clinical Impressions(s) / ED Diagnoses   Final diagnoses:  Non-intractable vomiting with nausea, unspecified vomiting type  Hypokalemia  Chronic anemia    New Prescriptions Discharge Medication List as of  10/21/2016  1:01 AM    START taking these medications   Details  nystatin (MYCOSTATIN) 100000 UNIT/ML suspension Take 5 mLs (500,000 Units total) by mouth 4 (four) times daily., Starting Tue 10/21/2016, Print    omeprazole (PRILOSEC) 20 MG capsule Take 1 capsule (20 mg total) by mouth daily., Starting Tue 10/21/2016, Print    ondansetron (ZOFRAN ODT) 4 MG disintegrating tablet Take 1 tablet (4 mg total) by mouth every 8 (eight) hours as needed for nausea or vomiting., Starting Tue 10/21/2016, Print    sulfamethoxazole-trimethoprim (BACTRIM,SEPTRA) 200-40 MG/5ML suspension Take 20 mLs by mouth 3 (three) times a week., Starting Wed 10/22/2016, Print         Antonietta Breach, PA-C 10/23/16 0630    Fredia Sorrow, MD 10/24/16 [phone]

## 2016-10-21 ENCOUNTER — Telehealth: Payer: Self-pay | Admitting: Surgery

## 2016-10-21 LAB — URINALYSIS, ROUTINE W REFLEX MICROSCOPIC
BILIRUBIN URINE: NEGATIVE
GLUCOSE, UA: NEGATIVE mg/dL
HGB URINE DIPSTICK: NEGATIVE
KETONES UR: 5 mg/dL — AB
Nitrite: POSITIVE — AB
PROTEIN: 30 mg/dL — AB
Specific Gravity, Urine: 1.009 (ref 1.005–1.030)
pH: 6 (ref 5.0–8.0)

## 2016-10-21 MED ORDER — SULFAMETHOXAZOLE-TRIMETHOPRIM 200-40 MG/5ML PO SUSP: 20.0000 mL | ORAL | 1 refills | Status: DC

## 2016-10-21 MED ORDER — OMEPRAZOLE 20 MG PO CPDR: 20.0000 mg | DELAYED_RELEASE_CAPSULE | Freq: Every day | ORAL | 0 refills | Status: DC

## 2016-10-21 MED ORDER — NYSTATIN 100000 UNIT/ML MT SUSP: 500000.0000 [IU] | Freq: Four times a day (QID) | OROMUCOSAL | 1 refills | Status: DC

## 2016-10-21 MED ORDER — ONDANSETRON 4 MG PO TBDP: 4.0000 mg | ORAL_TABLET | Freq: Three times a day (TID) | ORAL | 0 refills | Status: DC | PRN

## 2016-10-21 NOTE — ED Notes (Signed)
Pt given bus pass ?

## 2016-10-21 NOTE — Discharge Instructions (Signed)
We believe that your symptoms are due to esophagitis as a result of your poorly controlled HIV. Follow-up with Dr. Linus Salmons to get restarted on your HIV medications. We recommend the use of nystatin suspension to try and help alleviate your symptoms. Also, take Pepcid as prescribed. You should restart your Bactrim prophylaxis and take daily iron supplements. Take Zofran as needed for nausea. Follow-up with your primary care doctor at the [facility] as well. You may return for new or concerning symptoms.

## 2016-10-21 NOTE — Telephone Encounter (Addendum)
ED CM received call from patient regarding prescriptions received in the ED late last night. Patient states, she is uninsured and cannot afford the cost of the prescriptions. Patient also discloses she is currently homeless. CM  Discussed MATCH program and the guidelines including the  $3 co-pay per prescription. Patient states she does not have the funds at this time. CM explained that we will waive the co-pay at this time. Patient is appreciative and verbalized understanding. Pt enrolled and [place] letter printed, patient asked if [place] letter could be faxed to [facility] on [address] patient does not have a way to come back to [facility] [place] faxed letter to [phone], fax confirmation received. Patient was a patient at the [facility] in the past and would like to return,  Discussed placing a referral to [facility] patient is agreeable with plan.

## 2016-10-21 NOTE — ED Notes (Signed)
Pt given ginger ale for fluid challenge, will continue to monitor.

## 2016-10-23 ENCOUNTER — Telehealth: Payer: Self-pay | Admitting: *Deleted

## 2016-10-23 NOTE — Telephone Encounter (Signed)
Spoke to patient and scheduled her an appt with Colletta Maryland, NP. Myrtis Hopping

## 2016-10-23 NOTE — Telephone Encounter (Signed)
-----   Message from Thayer Headings, MD sent at 10/21/2016  9:41 AM EDT ----- It appears this pt moved back from [place] so will need to get back in.  Could have her scheduled with Colletta Maryland soon since I won't have availability.  thanks

## 2016-10-27 ENCOUNTER — Inpatient Hospital Stay (HOSPITAL_COMMUNITY)
Admission: EM | Admit: 2016-10-27 | Discharge: 2016-10-31 | DRG: 689 | Disposition: A | Discharge status: Home / Self Care | Payer: Self-pay | Attending: Family Medicine | Admitting: Family Medicine

## 2016-10-27 ENCOUNTER — Encounter (HOSPITAL_COMMUNITY): Payer: Self-pay | Admitting: Emergency Medicine

## 2016-10-27 ENCOUNTER — Emergency Department (HOSPITAL_COMMUNITY): Payer: Self-pay

## 2016-10-27 DIAGNOSIS — I10 Essential (primary) hypertension: Secondary | ICD-10-CM | POA: Diagnosis present

## 2016-10-27 DIAGNOSIS — B20 Human immunodeficiency virus [HIV] disease: Secondary | ICD-10-CM | POA: Diagnosis present

## 2016-10-27 DIAGNOSIS — E876 Hypokalemia: Secondary | ICD-10-CM | POA: Diagnosis present

## 2016-10-27 DIAGNOSIS — N390 Urinary tract infection, site not specified: Principal | ICD-10-CM | POA: Diagnosis present

## 2016-10-27 DIAGNOSIS — Z9119 Patient's noncompliance with other medical treatment and regimen: Secondary | ICD-10-CM

## 2016-10-27 DIAGNOSIS — R000 Tachycardia, unspecified: Secondary | ICD-10-CM | POA: Diagnosis present

## 2016-10-27 DIAGNOSIS — R319 Hematuria, unspecified: Secondary | ICD-10-CM | POA: Diagnosis present

## 2016-10-27 DIAGNOSIS — D508 Other iron deficiency anemias: Secondary | ICD-10-CM | POA: Diagnosis present

## 2016-10-27 DIAGNOSIS — E43 Unspecified severe protein-calorie malnutrition: Secondary | ICD-10-CM | POA: Diagnosis present

## 2016-10-27 DIAGNOSIS — Z9114 Patient's other noncompliance with medication regimen: Secondary | ICD-10-CM

## 2016-10-27 DIAGNOSIS — Z79899 Other long term (current) drug therapy: Secondary | ICD-10-CM

## 2016-10-27 DIAGNOSIS — K219 Gastro-esophageal reflux disease without esophagitis: Secondary | ICD-10-CM | POA: Diagnosis present

## 2016-10-27 DIAGNOSIS — B3781 Candidal esophagitis: Secondary | ICD-10-CM | POA: Diagnosis present

## 2016-10-27 DIAGNOSIS — Z681 Body mass index (BMI) 19 or less, adult: Secondary | ICD-10-CM

## 2016-10-27 DIAGNOSIS — R079 Chest pain, unspecified: Secondary | ICD-10-CM | POA: Diagnosis present

## 2016-10-27 HISTORY — DX: Gastro-esophageal reflux disease without esophagitis: K21.9

## 2016-10-27 LAB — URINALYSIS, ROUTINE W REFLEX MICROSCOPIC
BILIRUBIN URINE: NEGATIVE
GLUCOSE, UA: NEGATIVE mg/dL
Ketones, ur: 20 mg/dL — AB
NITRITE: POSITIVE — AB
PH: 6 (ref 5.0–8.0)
Protein, ur: 100 mg/dL — AB
SPECIFIC GRAVITY, URINE: 1.008 (ref 1.005–1.030)

## 2016-10-27 LAB — COMPREHENSIVE METABOLIC PANEL
ALBUMIN: 3.4 g/dL — AB (ref 3.5–5.0)
ALT: 9 U/L — ABNORMAL LOW (ref 14–54)
ANION GAP: 13 (ref 5–15)
AST: 18 U/L (ref 15–41)
Alkaline Phosphatase: 60 U/L (ref 38–126)
BILIRUBIN TOTAL: 0.8 mg/dL (ref 0.3–1.2)
BUN: 5 mg/dL — ABNORMAL LOW (ref 6–20)
CALCIUM: 9.3 mg/dL (ref 8.9–10.3)
CO2: 26 mmol/L (ref 22–32)
Chloride: 102 mmol/L (ref 101–111)
Creatinine, Ser: 0.64 mg/dL (ref 0.44–1.00)
GFR calc non Af Amer: >60 mL/min (ref >60)
GLUCOSE: 84 mg/dL (ref 65–99)
POTASSIUM: 2.7 mmol/L — AB (ref 3.5–5.1)
SODIUM: 141 mmol/L (ref 135–145)
TOTAL PROTEIN: 9.5 g/dL — AB (ref 6.5–8.1)

## 2016-10-27 LAB — CBC
HEMATOCRIT: 35.5 % — AB (ref 36.0–46.0)
HEMOGLOBIN: 10.9 g/dL — AB (ref 12.0–15.0)
MCH: 21.8 pg — ABNORMAL LOW (ref 26.0–34.0)
MCHC: 30.7 g/dL (ref 30.0–36.0)
MCV: 70.9 fL — ABNORMAL LOW (ref 78.0–100.0)
Platelets: 383 10*3/uL (ref 150–400)
RBC: 5.01 MIL/uL (ref 3.87–5.11)
RDW: 20.1 % — ABNORMAL HIGH (ref 11.5–15.5)
WBC: 4.2 10*3/uL (ref 4.0–10.5)

## 2016-10-27 LAB — TROPONIN I

## 2016-10-27 LAB — I-STAT BETA HCG BLOOD, ED (MC, WL, AP ONLY): I-stat hCG, quantitative: <5 m[IU]/mL (ref <5)

## 2016-10-27 LAB — LIPASE, BLOOD: Lipase: 36 U/L (ref 11–51)

## 2016-10-27 MED ORDER — MORPHINE SULFATE (PF) 4 MG/ML IV SOLN
4.0000 mg | Freq: Once | INTRAVENOUS | Status: AC
  Administered 2016-10-27: 4 mg via INTRAVENOUS
  Filled 2016-10-27: qty 1

## 2016-10-27 MED ORDER — POTASSIUM CHLORIDE CRYS ER 20 MEQ PO TBCR
40.0000 meq | EXTENDED_RELEASE_TABLET | Freq: Once | ORAL | Status: DC
  Filled 2016-10-27: qty 2

## 2016-10-27 MED ORDER — DEXTROSE 5 % IV SOLN
1.0000 g | Freq: Once | INTRAVENOUS | Status: AC
  Administered 2016-10-27: 1 g via INTRAVENOUS
  Filled 2016-10-27: qty 10

## 2016-10-27 MED ORDER — SODIUM CHLORIDE 0.9 % IV BOLUS (SEPSIS)
2000.0000 mL | Freq: Once | INTRAVENOUS | Status: AC
  Administered 2016-10-27: 2000 mL via INTRAVENOUS

## 2016-10-27 MED ORDER — IOPAMIDOL (ISOVUE-370) INJECTION 76%
INTRAVENOUS | Status: AC
  Administered 2016-10-27: 100 mL via INTRAVENOUS
  Filled 2016-10-27: qty 100

## 2016-10-27 MED ORDER — ONDANSETRON HCL 4 MG/2ML IJ SOLN
4.0000 mg | Freq: Once | INTRAMUSCULAR | Status: AC
  Administered 2016-10-27: 4 mg via INTRAVENOUS
  Filled 2016-10-27: qty 2

## 2016-10-27 NOTE — ED Notes (Addendum)
Pt presents because she believes she has a bacterial infection in her lungs. Pt states this has been going on for 3 weeks now. Pt also reports chronic nausea.

## 2016-10-27 NOTE — ED Notes (Signed)
Pt refused oral potassium stating "I told the PA I couldn't swallow large pills and she told me she would give it to me IV."

## 2016-10-27 NOTE — ED Triage Notes (Signed)
Pt states she was seen in this ED last Monday, states she was treated for a bacterial infection in her throat and none of the medications are helping. Pt states she is unable to keep any food down, and has thrown up "about 7 times" in last 24 hours.

## 2016-10-27 NOTE — ED Provider Notes (Signed)
[facility] DEPT Provider Note   CSN: [id number] Arrival date & time: 10/27/16  1906     History   Chief Complaint Chief Complaint  Patient presents with  . Emesis    HPI   Blood pressure (!) 152/118, pulse (!) 124, temperature 98.4 F (36.9 C), temperature source Oral, resp. rate 18, height 5\' 1"  (1.549 m), weight 43.1 kg (95 lb), SpO2 100 %.  Shirley White is a 46 y.o. female with past medical history significant for HIV/AIDS last CD4 count was 30, she's been off of her antiretroviral medications for 3 weeks. She recently moved to the area from [place], she had a 1 day bus ride  one week ago. She states that she's had a severe sore throat with nausea and vomiting (no abdominal pain) and she feels like she has a bacterial infection. She states that she had a fever of 105 yesterday. She states that her heart is racing. On review of systems she notes intermittently productive cough with the syncope today while she was getting out of the shower. She states that she feels quite dizzy. This is not vertigo but lightheadedness. She denies any calf pain or leg swelling. She doesn't have a history of DVT/PE. She states that she was seen for similar complaints one week ago and was restarted on her prophylactic Bactrim, Bactrim, nystatin which she has been compliant with. She also notes a dysuria several weeks ago which has resolved. She denies any flank pain. She denies any abnormal vaginal discharge.   Past Medical History:  Diagnosis Date  . Anemia   . GERD (gastroesophageal reflux disease)   . HIV disease ([place])   . Hypertension     Patient Active Problem List   Diagnosis Date Noted  . UTI (urinary tract infection) 10/28/2016  . Hypokalemia 10/28/2016  . Routine health maintenance 08/28/2016  . Immunocompromised status associated with infection ([place]) 02/03/2016  . Esophageal candidiasis ([place]) 02/02/2016  . Chest pain 02/02/2016  . AIDS ([place]) 02/02/2016  . Onychomycosis  12/13/2015  . Iron deficiency anemia due to chronic blood loss   . H/O menorrhagia 09/05/2014  . Human immunodeficiency virus I infection ([place]) 06/04/2006  . Sickle-cell trait ([place]) 06/04/2006    Past Surgical History:  Procedure Laterality Date  . CESAREAN SECTION    . ESOPHAGOGASTRODUODENOSCOPY N/A 02/03/2016   Procedure: ESOPHAGOGASTRODUODENOSCOPY (EGD);  Surgeon: Manus Gunning, MD;  Location: [facility];  Service: Gastroenterology;  Laterality: N/A;    OB History    Gravida Para Term Preterm AB Living   4 4 4          SAB TAB Ectopic Multiple Live Births                   Home Medications    Prior to Admission medications   Medication Sig Start Date End Date Taking? Authorizing Provider  dolutegravir (TIVICAY) 50 MG tablet Take 1 tablet (50 mg total) by mouth daily. 05/20/16  Yes Comer, Okey Regal, MD  emtricitabine-tenofovir AF (DESCOVY) 200-25 MG tablet Take 1 tablet by mouth daily. 05/20/16  Yes Comer, Okey Regal, MD  nystatin (MYCOSTATIN) 100000 UNIT/ML suspension Take 5 mLs (500,000 Units total) by mouth 4 (four) times daily. 10/21/16  Yes Antonietta Breach, PA-C  omeprazole (PRILOSEC) 20 MG capsule Take 1 capsule (20 mg total) by mouth daily. 10/21/16  Yes Antonietta Breach, PA-C  ondansetron (ZOFRAN ODT) 4 MG disintegrating tablet Take 1 tablet (4 mg total) by mouth every 8 (eight) hours as  needed for nausea or vomiting. 10/21/16  Yes Antonietta Breach, PA-C  sulfamethoxazole-trimethoprim (BACTRIM,SEPTRA) 200-40 MG/5ML suspension Take 20 mLs by mouth 3 (three) times a week. Patient taking differently: Take 20 mLs by mouth every Monday, Wednesday, and [place].  10/22/16  Yes Antonietta Breach, PA-C    Family History Family History  Problem Relation Age of Onset  . Hyperlipidemia Mother   . Hypertension Mother   . Cancer Father     Social History Social History  Substance Use Topics  . Smoking status: Never Smoker  . Smokeless tobacco: Never Used  . Alcohol use 0.6 oz/week    1  Cans of beer per week     Comment: occa     Allergies   Patient has no known allergies.   Review of Systems Review of Systems  A complete review of systems was obtained and all systems are negative except as noted in the HPI and PMH.   Physical Exam Updated Vital Signs BP (!) 159/88 (BP Location: Right Arm)   Pulse 84   Temp 99 F (37.2 C) (Oral)   Resp 12   Ht 5\' 1"  (1.549 m)   Wt 43.1 kg (95 lb)   LMP 10/27/2016 (Exact Date)   SpO2 100%   BMI 17.95 kg/m   Physical Exam  Constitutional: She is oriented to person, place, and time. She appears well-developed. No distress.  Thin and frail  HENT:  Head: Normocephalic and atraumatic.  Mouth/Throat: Oropharynx is clear and moist.  Controlling her secretions, and uvula midline, soft palate rises symmetrically. No tonsillar hypertrophy, posterior pharynx and soft palate with very mild erythema, no lesions  Eyes: Pupils are equal, round, and reactive to light. Conjunctivae and EOM are normal.  Neck: Normal range of motion. No JVD present. No tracheal deviation present.  Cardiovascular: Regular rhythm and intact distal pulses.   Tachycardic and regular  Pulmonary/Chest: Effort normal and breath sounds normal. No stridor. No respiratory distress. She has no wheezes. She has no rales. She exhibits no tenderness.  Abdominal: Soft. She exhibits no distension and no mass. There is no tenderness. There is no rebound and no guarding.  Genitourinary:  Genitourinary Comments: No CVA tenderness to percussion bilaterally  Musculoskeletal: Normal range of motion. She exhibits no edema or tenderness.  No calf asymmetry, superficial collaterals, palpable cords, edema, Homans sign negative bilaterally.    Neurological: She is alert and oriented to person, place, and time.  Skin: Skin is warm. She is not diaphoretic.  Psychiatric: She has a normal mood and affect.  Nursing note and vitals reviewed.    ED Treatments / Results  Labs (all  labs ordered are listed, but only abnormal results are displayed) Labs Reviewed  COMPREHENSIVE METABOLIC PANEL - Abnormal; Notable for the following:       Result Value   Potassium 2.7 (*)    BUN 5 (*)    Total Protein 9.5 (*)    Albumin 3.4 (*)    ALT 9 (*)    All other components within normal limits  CBC - Abnormal; Notable for the following:    Hemoglobin 10.9 (*)    HCT 35.5 (*)    MCV 70.9 (*)    MCH 21.8 (*)    RDW 20.1 (*)    All other components within normal limits  URINALYSIS, ROUTINE W REFLEX MICROSCOPIC - Abnormal; Notable for the following:    APPearance HAZY (*)    Hgb urine dipstick LARGE (*)    Ketones,  ur 20 (*)    Protein, ur 100 (*)    Nitrite POSITIVE (*)    Leukocytes, UA MODERATE (*)    Bacteria, UA MANY (*)    Squamous Epithelial / LPF 0-5 (*)    All other components within normal limits  CBC WITH DIFFERENTIAL/PLATELET - Abnormal; Notable for the following:    Hemoglobin 10.0 (*)    HCT 31.4 (*)    MCV 71.5 (*)    MCH 22.8 (*)    RDW 20.2 (*)    All other components within normal limits  BASIC METABOLIC PANEL - Abnormal; Notable for the following:    Sodium 146 (*)    Potassium 2.8 (*)    Chloride 114 (*)    BUN <5 (*)    Calcium 8.3 (*)    All other components within normal limits  MAGNESIUM - Abnormal; Notable for the following:    Magnesium 1.5 (*)    All other components within normal limits  PHOSPHORUS - Abnormal; Notable for the following:    Phosphorus 2.1 (*)    All other components within normal limits  CULTURE, BLOOD (ROUTINE X 2)  CULTURE, BLOOD (ROUTINE X 2)  URINE CULTURE  LIPASE, BLOOD  TROPONIN I  TROPONIN I  I-STAT CG4 LACTIC ACID, ED  I-STAT BETA HCG BLOOD, ED ([facility], [facility], [facility] ONLY)  I-STAT CG4 LACTIC ACID, ED    EKG  EKG Interpretation  Date/Time:  Monday October 27 2016 19:58:54 EDT Ventricular Rate:  112 PR Interval:  176 QRS Duration: 112 QT Interval:  388 QTC Calculation: 529 R Axis:   84 Text Interpretation:   Sinus tachycardia Right bundle branch block ST elevation consider anterior injury or acute infarct Abnormal ECG ST elevation in one lead, V3.  EKG repeated with resolution of ST elevation.  No STEMI Confirmed by Antony Blackbird [phone]) on 10/27/2016 9:41:30 PM       Radiology Dg Chest 2 View  Result Date: 10/27/2016 CLINICAL DATA:  46 year old female chest pain. EXAM: CHEST  2 VIEW COMPARISON:  Chest radiograph dated 02/02/2016 FINDINGS: The heart size and mediastinal contours are within normal limits. Both lungs are clear. The visualized skeletal structures are unremarkable. IMPRESSION: No active cardiopulmonary disease. Electronically Signed   By: Anner Crete M.D.   On: 10/27/2016 21:09   Ct Angio Chest Pe W And/or Wo Contrast  Result Date: 10/28/2016 CLINICAL DATA:  Chest pain and syncope. Recent travel history. Immunocompromised. EXAM: CT ANGIOGRAPHY CHEST WITH CONTRAST TECHNIQUE: Multidetector CT imaging of the chest was performed using the standard protocol during bolus administration of intravenous contrast. Multiplanar CT image reconstructions and MIPs were obtained to evaluate the vascular anatomy. CONTRAST:  100 cc Isovue 370 IV COMPARISON:  CXR 10/27/2016 FINDINGS: Cardiovascular: Normal branch pattern of the great vessels off the aortic arch. No aortic aneurysm or dissection. The main pulmonary artery is slightly dilated up to 3 cm consistent with pulmonary hypertension. No pulmonary embolus. Heart is normal in size without pericardial effusion. Mediastinum/Nodes: Debris and fluid-filled distention of the esophagus to the proximal third. Mild diffuse thickening of the esophagus is also noted question esophagitis. No lymphadenopathy. No thyromegaly or nodules. Lungs/Pleura: Lungs are clear. No pleural effusion or pneumothorax. Upper Abdomen: No acute abnormality. Slightly thick-walled appearance of the stomach is nonspecific but likely due to underdistention. Musculoskeletal: No chest wall  abnormality. No acute or significant osseous findings. Review of the MIP images confirms the above findings. IMPRESSION: 1. No acute pulmonary embolus. Mild dilatation of  the main pulmonary artery to 3 cm which may reflect pulmonary hypertension. 2. No pneumonic consolidation or CHF. 3. Debris and fluid-filled distention of the esophagus to the proximal third. Mild thickening of the esophagus is also noted. Query esophagitis. Electronically Signed   By: Ashley Royalty M.D.   On: 10/28/2016 01:51    Procedures Procedures (including critical care time)  Medications Ordered in ED Medications  potassium chloride SA (K-DUR,KLOR-CON) CR tablet 40 mEq (40 mEq Oral Refused 10/27/16 2223)  cefTRIAXone (ROCEPHIN) 1 g in dextrose 5 % 50 mL IVPB (not administered)  heparin injection 5,000 Units (5,000 Units Subcutaneous Given 10/28/16 0448)  0.9 % NaCl with KCl 20 mEq/ L  infusion (125 mL/hr Intravenous New Bag/Given 10/28/16 0440)  ondansetron (ZOFRAN) tablet 4 mg ( Oral See Alternative 10/28/16 0703)    Or  ondansetron (ZOFRAN) injection 4 mg (4 mg Intravenous Given 10/28/16 0703)  feeding supplement (ENSURE ENLIVE) (ENSURE ENLIVE) liquid 237 mL (not administered)  sodium chloride 0.9 % bolus 2,000 mL (0 mLs Intravenous Stopped 10/28/16 0021)  morphine 4 MG/ML injection 4 mg (4 mg Intravenous Given 10/27/16 2223)  ondansetron (ZOFRAN) injection 4 mg (4 mg Intravenous Given 10/27/16 2223)  cefTRIAXone (ROCEPHIN) 1 g in dextrose 5 % 50 mL IVPB (0 g Intravenous Stopped 10/27/16 2315)  iopamidol (ISOVUE-370) 76 % injection (100 mLs Intravenous Contrast Given 10/27/16 2253)  morphine 4 MG/ML injection 4 mg (4 mg Intravenous Given 10/28/16 0443)  ondansetron (ZOFRAN) injection 4 mg (4 mg Intravenous Given 10/28/16 0443)  pantoprazole (PROTONIX) injection 40 mg (40 mg Intravenous Given 10/28/16 0443)     Initial Impression / Assessment and Plan / ED Course  I have reviewed the triage vital signs and the nursing  notes.  Pertinent labs & imaging results that were available during my care of the patient were reviewed by me and considered in my medical decision making (see chart for details).     Vitals:   10/28/16 0230 10/28/16 0245 10/28/16 0408 10/28/16 0659  BP: (!) 151/92 (!) 143/90 (!) 172/86 (!) 159/88  Pulse: 85 88 84   Resp: 13 12    Temp:   99 F (37.2 C)   TempSrc:   Oral   SpO2: 98% 99% 100%   Weight:      Height:        Medications  potassium chloride SA (K-DUR,KLOR-CON) CR tablet 40 mEq (40 mEq Oral Refused 10/27/16 2223)  cefTRIAXone (ROCEPHIN) 1 g in dextrose 5 % 50 mL IVPB (not administered)  heparin injection 5,000 Units (5,000 Units Subcutaneous Given 10/28/16 0448)  0.9 % NaCl with KCl 20 mEq/ L  infusion (125 mL/hr Intravenous New Bag/Given 10/28/16 0440)  ondansetron (ZOFRAN) tablet 4 mg ( Oral See Alternative 10/28/16 0703)    Or  ondansetron (ZOFRAN) injection 4 mg (4 mg Intravenous Given 10/28/16 0703)  feeding supplement (ENSURE ENLIVE) (ENSURE ENLIVE) liquid 237 mL (not administered)  sodium chloride 0.9 % bolus 2,000 mL (0 mLs Intravenous Stopped 10/28/16 0021)  morphine 4 MG/ML injection 4 mg (4 mg Intravenous Given 10/27/16 2223)  ondansetron (ZOFRAN) injection 4 mg (4 mg Intravenous Given 10/27/16 2223)  cefTRIAXone (ROCEPHIN) 1 g in dextrose 5 % 50 mL IVPB (0 g Intravenous Stopped 10/27/16 2315)  iopamidol (ISOVUE-370) 76 % injection (100 mLs Intravenous Contrast Given 10/27/16 2253)  morphine 4 MG/ML injection 4 mg (4 mg Intravenous Given 10/28/16 0443)  ondansetron (ZOFRAN) injection 4 mg (4 mg Intravenous Given 10/28/16 0443)  pantoprazole (PROTONIX)  injection 40 mg (40 mg Intravenous Given 10/28/16 0443)    Shirley White is 46 y.o. female presenting with Multiple complaints including sore throat, vomiting, chest pain, palpitations, fever, and syncope. Initial EKG with profound ST elevation and isolated V3. Repeat EKG normalized. Troponin negative. Consider PE.  We'll also discuss EKG with cardiology after CTA. Urinalysis is consistent with infection, this patient is immunocompromised with a CD4 count of 30. She does not have any CVA tenderness to suggest a pyelonephritis, abdominal exam is completely benign. She is denying any abnormal vaginal discharge or pelvic pain. She is reporting a sore throat however, physical exam with no significant abnormality and she has been compliant with her nystatin. Will check blood cultures, lactic acid, aggressive hydration for her tachycardia  Lactic acid is unremarkable. The CT angiogram is negative for PE, given her immune suppression and urinary tract infection with a combination of chest pain and abnormal EKG she will need admission, discussed with Dr. Olevia Bowens who accepts.      Final Clinical Impressions(s) / ED Diagnoses   Final diagnoses:  Chest pain, unspecified type  Urinary tract infection with hematuria, site unspecified    New Prescriptions Current Discharge Medication List       Waynetta Pean 10/28/16 0947    Tegeler, Gwenyth Allegra, MD 10/28/16 1023

## 2016-10-28 ENCOUNTER — Encounter (HOSPITAL_COMMUNITY): Payer: Self-pay | Admitting: Internal Medicine

## 2016-10-28 ENCOUNTER — Ambulatory Visit: Payer: Medicaid Other | Admitting: Infectious Diseases

## 2016-10-28 DIAGNOSIS — E876 Hypokalemia: Secondary | ICD-10-CM

## 2016-10-28 DIAGNOSIS — N390 Urinary tract infection, site not specified: Secondary | ICD-10-CM | POA: Diagnosis present

## 2016-10-28 LAB — CBC WITH DIFFERENTIAL/PLATELET
Basophils Absolute: 0 10*3/uL (ref 0.0–0.1)
Basophils Relative: 1 %
Eosinophils Absolute: 0.1 10*3/uL (ref 0.0–0.7)
Eosinophils Relative: 2 %
HCT: 31.4 % — ABNORMAL LOW (ref 36.0–46.0)
HEMOGLOBIN: 10 g/dL — AB (ref 12.0–15.0)
LYMPHS ABS: 1.1 10*3/uL (ref 0.7–4.0)
LYMPHS PCT: 23 %
MCH: 22.8 pg — AB (ref 26.0–34.0)
MCHC: 31.8 g/dL (ref 30.0–36.0)
MCV: 71.5 fL — AB (ref 78.0–100.0)
Monocytes Absolute: 0.5 10*3/uL (ref 0.1–1.0)
Monocytes Relative: 10 %
NEUTROS PCT: 65 %
Neutro Abs: 3 10*3/uL (ref 1.7–7.7)
Platelets: 321 10*3/uL (ref 150–400)
RBC: 4.39 MIL/uL (ref 3.87–5.11)
RDW: 20.2 % — ABNORMAL HIGH (ref 11.5–15.5)
WBC: 4.7 10*3/uL (ref 4.0–10.5)

## 2016-10-28 LAB — MAGNESIUM: MAGNESIUM: 1.5 mg/dL — AB (ref 1.7–2.4)

## 2016-10-28 LAB — BASIC METABOLIC PANEL
Anion gap: 10 (ref 5–15)
CHLORIDE: 114 mmol/L — AB (ref 101–111)
CO2: 22 mmol/L (ref 22–32)
Calcium: 8.3 mg/dL — ABNORMAL LOW (ref 8.9–10.3)
Creatinine, Ser: 0.57 mg/dL (ref 0.44–1.00)
GFR calc Af Amer: >60 mL/min (ref >60)
GFR calc non Af Amer: >60 mL/min (ref >60)
Glucose, Bld: 82 mg/dL (ref 65–99)
POTASSIUM: 2.8 mmol/L — AB (ref 3.5–5.1)
Sodium: 146 mmol/L — ABNORMAL HIGH (ref 135–145)

## 2016-10-28 LAB — TROPONIN I: Troponin I: <0.03 ng/mL (ref <0.03)

## 2016-10-28 LAB — PHOSPHORUS: Phosphorus: 2.1 mg/dL — ABNORMAL LOW (ref 2.5–4.6)

## 2016-10-28 LAB — CG4 I-STAT (LACTIC ACID): LACTIC ACID, VENOUS: 2.81 mmol/L — AB (ref 0.5–1.9)

## 2016-10-28 MED ORDER — ONDANSETRON HCL 4 MG/2ML IJ SOLN
4.0000 mg | Freq: Once | INTRAMUSCULAR | Status: AC
  Administered 2016-10-28: 4 mg via INTRAVENOUS
  Filled 2016-10-28: qty 2

## 2016-10-28 MED ORDER — PANTOPRAZOLE SODIUM 40 MG IV SOLR
40.0000 mg | Freq: Once | INTRAVENOUS | Status: AC
  Administered 2016-10-28: 40 mg via INTRAVENOUS
  Filled 2016-10-28: qty 40

## 2016-10-28 MED ORDER — KETOROLAC TROMETHAMINE 15 MG/ML IJ SOLN
15.0000 mg | Freq: Four times a day (QID) | INTRAMUSCULAR | Status: DC | PRN
  Administered 2016-10-28 – 2016-10-31 (×3): 15 mg via INTRAVENOUS
  Filled 2016-10-28 (×3): qty 1

## 2016-10-28 MED ORDER — ONDANSETRON HCL 4 MG/2ML IJ SOLN
4.0000 mg | Freq: Four times a day (QID) | INTRAMUSCULAR | Status: DC | PRN
  Administered 2016-10-28: 4 mg via INTRAVENOUS
  Filled 2016-10-28: qty 2

## 2016-10-28 MED ORDER — LIDOCAINE VISCOUS 2 % MT SOLN
15.0000 mL | OROMUCOSAL | Status: DC | PRN
  Administered 2016-10-31: 15 mL via OROMUCOSAL
  Filled 2016-10-28 (×2): qty 15

## 2016-10-28 MED ORDER — DEXTROSE 5 % IV SOLN
1.0000 g | INTRAVENOUS | Status: DC
  Administered 2016-10-29: 1 g via INTRAVENOUS
  Filled 2016-10-28: qty 10

## 2016-10-28 MED ORDER — ONDANSETRON HCL 4 MG PO TABS: 4.0000 mg | ORAL_TABLET | Freq: Four times a day (QID) | ORAL | Status: DC | PRN

## 2016-10-28 MED ORDER — BOOST / RESOURCE BREEZE PO LIQD
1.0000 | ORAL | Status: DC
  Administered 2016-10-28 – 2016-10-29 (×2): 1 via ORAL

## 2016-10-28 MED ORDER — ENSURE ENLIVE PO LIQD
237.0000 mL | Freq: Two times a day (BID) | ORAL | Status: DC
  Administered 2016-10-28 – 2016-10-31 (×3): 237 mL via ORAL

## 2016-10-28 MED ORDER — MORPHINE SULFATE (PF) 4 MG/ML IV SOLN
4.0000 mg | Freq: Once | INTRAVENOUS | Status: AC
  Administered 2016-10-28: 4 mg via INTRAVENOUS
  Filled 2016-10-28: qty 1

## 2016-10-28 MED ORDER — FLUCONAZOLE 100 MG PO TABS
400.0000 mg | ORAL_TABLET | Freq: Every day | ORAL | Status: DC
  Administered 2016-10-28 – 2016-10-31 (×4): 400 mg via ORAL
  Filled 2016-10-28 (×4): qty 4

## 2016-10-28 MED ORDER — POTASSIUM CHLORIDE IN NACL 20-0.9 MEQ/L-% IV SOLN
INTRAVENOUS | Status: DC
  Administered 2016-10-28: 17:00:00 via INTRAVENOUS
  Administered 2016-10-28: 125 mL/h via INTRAVENOUS
  Administered 2016-10-29 – 2016-10-31 (×5): via INTRAVENOUS
  Filled 2016-10-28 (×9): qty 1000

## 2016-10-28 MED ORDER — HEPARIN SODIUM (PORCINE) 5000 UNIT/ML IJ SOLN
5000.0000 [IU] | Freq: Three times a day (TID) | INTRAMUSCULAR | Status: DC
  Administered 2016-10-28 – 2016-10-31 (×10): 5000 [IU] via SUBCUTANEOUS
  Filled 2016-10-28 (×10): qty 1

## 2016-10-28 MED ORDER — MAGNESIUM SULFATE 2 GM/50ML IV SOLN
2.0000 g | Freq: Once | INTRAVENOUS | Status: AC
  Administered 2016-10-28: 2 g via INTRAVENOUS
  Filled 2016-10-28: qty 50

## 2016-10-28 MED ORDER — FLUCONAZOLE 40 MG/ML PO SUSR
400.0000 mg | Freq: Every day | ORAL | Status: DC
Start: 2016-10-28 — End: 2016-10-28

## 2016-10-28 NOTE — Progress Notes (Signed)
Initial Nutrition Assessment  DOCUMENTATION CODES:   Severe malnutrition in context of chronic illness  INTERVENTION:   Ensure Enlive po BID, each supplement provides 350 kcal and 20 grams of protein  Boost Breeze po once a day, each supplement provides 250 kcal and 9 grams of protein  NUTRITION DIAGNOSIS:   Malnutrition (Moderate) related to chronic illness (HIV/AIDS) as evidenced by severe depletion of muscle mass, severe depletion of body fat.  GOAL:   Patient will meet greater than or equal to 90% of their needs  MONITOR:   PO intake, Supplement acceptance, Weight trends, Labs  REASON FOR ASSESSMENT:   Malnutrition Screening Tool    ASSESSMENT:   Pt with PMH of HIV/AIDS and chronic anemia. Admitted for UTI, nausea, and vomiting. 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   Pt currently on regular diet, consuming 0% at last meal. Reports having loss of appetite for 1 month due to stomach and esophagus issues. Pt explains that she has pain upon eating that feels like acid reflux. Per recall, pt consuming only bites at meal times. Suspect pt meets criteria for decreased energy intake for malnutrition, but cannot quantify with given recall.   Reports a UBW of 110 lb, the last time at that known wt being in June 2018. Records indicates pt weighed 100 lb in May 2018 and 95 lbs this admission, a 5% wt loss in 3 months. This percentage is not significant in this time frame for malnutrition.  Nutrition-Focused physical exam completed. Findings are severe fat depletion in upper arm and lumbar regions, severe muscle depletion in the temples, clavicle, patellar, thigh, and calf regions and no edema. Given Pt's severe muscle and fat depletion and suspected decreased energy intake this pt meets criteria for severe malnutrition in the context of chronic illness. Will provide supplementation with Ensure Enlive and Boost Breeze, upon  pt request. Monitor electrolytes K and Phosphorus for refeeding.   Medications reviewed and include: Ensure Enlive, NS with KCl @ 146ml/hr, IV abx, and mag sulfate Labs reviewed: Na 146 (H), K 2.8 (L), Phos 2.1 (L), Mg 1.5 (L)  Diet Order:  Diet regular Room service appropriate? Yes; Fluid consistency: Thin  Skin:  Reviewed, no issues  Last BM:  unknown  Height:   Ht Readings from Last 1 Encounters:  10/27/16 5\' 1"  (1.549 m)    Weight:   Wt Readings from Last 1 Encounters:  10/27/16 95 lb (43.1 kg)    Ideal Body Weight:  47.7 kg  BMI:  Body mass index is 17.95 kg/m.  Estimated Nutritional Needs:   Kcal:  1500-1700 (35-39 kcal/kg)  Protein:  75-85 grams (1.7-1.9 g/kg)  Fluid:  >1.5 L/day  EDUCATION NEEDS:   No education needs identified at this time  [place], LDN Pager # - [phone]

## 2016-10-28 NOTE — H&P (Signed)
History and Physical    Shirley White CVE:[id number] DOB: 12/14/1970 DOA: 10/27/2016  PCP: Neva Seat, MD   Patient coming from: Home.  I have personally briefly reviewed patient's old medical records in [place]  Chief Complaint: Nausea, vomiting and chest pain.  HPI: Shirley White is a 46 y.o. female with medical history significant of anemia, HIV disease/AIDS last CD4 was 39, off antiretrovirals for the past 3 weeks, who recently moved from [place] to this area about a week ago and was seen that day by the ED for nausea and emesis. Discharge home on Zofran, nystatin, pantoprazole and Bactrim for PCP prophylaxis who is returning today with complaints of worsening chest pain, persistent nausea, subjective fever for several days.   Per patient, the pain is burning like, nonradiating, nonpleuritic, without dyspnea, palpitations, dizziness or diaphoresis. The pain and other symptoms are similar to the symptoms she experienced last year when she was seen here for Candida esophagitis. She denies sore throat, productive cough, dyspnea, wheezing, diarrhea, constipation, melena, or hematochezia. She also mentions that she has been having dysuria, but denies hematuria, oliguria or flank pain.  ED Course: The patient received IV fluids, potassium supplementation, IV ceftriaxone, morphine and Zofran in the emergency department. Her workup shows an urinalysis with pyuria and bacteriuria. WBC 4.2, hemoglobin 10.9 g/dL and platelets 383, her CMP showed a potassium of 2.7 mmol/L, total protein of 9.5 and albumin of 3.4 g/dL.   Imaging: CT angiogram of the chest showed proximal third of the esophagus distention. It also showed thickening of the esophageal mucosa. Please see images and full radiology report for further details.  Review of Systems: As per HPI otherwise 10 point review of systems negative.    Past Medical History:  Diagnosis Date  . Anemia   . HIV disease [facility])       Past Surgical History:  Procedure Laterality Date  . CESAREAN SECTION    . ESOPHAGOGASTRODUODENOSCOPY N/A 02/03/2016   Procedure: ESOPHAGOGASTRODUODENOSCOPY (EGD);  Surgeon: Manus Gunning, MD;  Location: [place];  Service: Gastroenterology;  Laterality: N/A;     reports that she has never smoked. She has never used smokeless tobacco. She reports that she drinks about 0.6 oz of alcohol per week . She reports that she uses drugs, including Marijuana, about 7 times per week.  No Known Allergies  Family History  Problem Relation Age of Onset  . Hyperlipidemia Mother   . Hypertension Mother   . Cancer Father     Prior to Admission medications   Medication Sig Start Date End Date Taking? Authorizing Provider  dolutegravir (TIVICAY) 50 MG tablet Take 1 tablet (50 mg total) by mouth daily. 05/20/16  Yes Comer, Okey Regal, MD  emtricitabine-tenofovir AF (DESCOVY) 200-25 MG tablet Take 1 tablet by mouth daily. 05/20/16  Yes Comer, Okey Regal, MD  nystatin (MYCOSTATIN) 100000 UNIT/ML suspension Take 5 mLs (500,000 Units total) by mouth 4 (four) times daily. 10/21/16  Yes Antonietta Breach, PA-C  omeprazole (PRILOSEC) 20 MG capsule Take 1 capsule (20 mg total) by mouth daily. 10/21/16  Yes Antonietta Breach, PA-C  ondansetron (ZOFRAN ODT) 4 MG disintegrating tablet Take 1 tablet (4 mg total) by mouth every 8 (eight) hours as needed for nausea or vomiting. 10/21/16  Yes Antonietta Breach, PA-C  sulfamethoxazole-trimethoprim (BACTRIM,SEPTRA) 200-40 MG/5ML suspension Take 20 mLs by mouth 3 (three) times a week. Patient taking differently: Take 20 mLs by mouth every Monday, Wednesday, and [place].  10/22/16  Yes Humes,  Claiborne Billings, [place]    Physical Exam: Vitals:   10/28/16 0215 10/28/16 0230 10/28/16 0245 10/28/16 0408  BP: (!) 149/87 (!) 151/92 (!) 143/90 (!) 172/86  Pulse: 95 85 88 84  Resp: 18 13 12    Temp:    99 F (37.2 C)  TempSrc:    Oral  SpO2: 100% 98% 99% 100%  Weight:      Height:         Constitutional: Chachetic and malnourished. Eyes: PERRL, lids and conjunctivae normal ENMT: Mucous membranes are dry. Posterior pharynx clear of any exudate or lesions. Neck: normal, supple, no masses, no thyromegaly Respiratory: clear to auscultation bilaterally, no wheezing, no crackles. Normal respiratory effort. No accessory muscle use.  Cardiovascular: Regular rate and rhythm, Positive systolic murmur, no rubs / gallops. No extremity edema. 2+ pedal pulses. No carotid bruits.  Abdomen: Soft, no tenderness, no masses palpated. No hepatosplenomegaly. Bowel sounds positive.  Musculoskeletal: no clubbing / cyanosis.  Good ROM, no contractures. Normal muscle tone.  Skin: no rashes, lesions, ulcers on limited skin exam, except for blister on left index finger Neurologic: CN 2-12 grossly intact. Sensation intact, DTR normal. Strength 5/5 in all 4.  Psychiatric: Normal judgment and insight. Alert and oriented x 4. Normal mood.    Labs on Admission: I have personally reviewed following labs and imaging studies  CBC:  Recent Labs Lab 10/27/16 1950  WBC 4.2  HGB 10.9*  HCT 35.5*  MCV 70.9*  PLT 932   Basic Metabolic Panel:  Recent Labs Lab 10/27/16 1950  NA 141  K 2.7*  CL 102  CO2 26  GLUCOSE 84  BUN 5*  CREATININE 0.64  CALCIUM 9.3   GFR: Estimated Creatinine Clearance: 61.1 mL/min (by C-G formula based on SCr of 0.64 mg/dL). Liver Function Tests:  Recent Labs Lab 10/27/16 1950  AST 18  ALT 9*  ALKPHOS 60  BILITOT 0.8  PROT 9.5*  ALBUMIN 3.4*    Recent Labs Lab 10/27/16 1950  LIPASE 36   No results for input(s): AMMONIA in the last 168 hours. Coagulation Profile: No results for input(s): INR, PROTIME in the last 168 hours. Cardiac Enzymes:  Recent Labs Lab 10/27/16 1950  TROPONINI <0.03   BNP (last 3 results) No results for input(s): PROBNP in the last 8760 hours. HbA1C: No results for input(s): HGBA1C in the last 72 hours. CBG: No results  for input(s): GLUCAP in the last 168 hours. Lipid Profile: No results for input(s): CHOL, HDL, LDLCALC, TRIG, CHOLHDL, LDLDIRECT in the last 72 hours. Thyroid Function Tests: No results for input(s): TSH, T4TOTAL, FREET4, T3FREE, THYROIDAB in the last 72 hours. Anemia Panel: No results for input(s): VITAMINB12, FOLATE, FERRITIN, TIBC, IRON, RETICCTPCT in the last 72 hours. Urine analysis:    Component Value Date/Time   COLORURINE YELLOW 10/27/2016 1952   APPEARANCEUR HAZY (A) 10/27/2016 1952   LABSPEC 1.008 10/27/2016 1952   PHURINE 6.0 10/27/2016 1952   GLUCOSEU NEGATIVE 10/27/2016 1952   GLUCOSEU NEG mg/dL 05/21/2006 2245   HGBUR LARGE (A) 10/27/2016 1952   BILIRUBINUR NEGATIVE 10/27/2016 1952   KETONESUR 20 (A) 10/27/2016 1952   PROTEINUR 100 (A) 10/27/2016 1952   UROBILINOGEN 1.0 11/16/2014 1341   NITRITE POSITIVE (A) 10/27/2016 1952   LEUKOCYTESUR MODERATE (A) 10/27/2016 1952    Radiological Exams on Admission: Dg Chest 2 View  Result Date: 10/27/2016 CLINICAL DATA:  46 year old female chest pain. EXAM: CHEST  2 VIEW COMPARISON:  Chest radiograph dated 02/02/2016 FINDINGS: The heart size  and mediastinal contours are within normal limits. Both lungs are clear. The visualized skeletal structures are unremarkable. IMPRESSION: No active cardiopulmonary disease. Electronically Signed   By: Anner Crete M.D.   On: 10/27/2016 21:09   Ct Angio Chest Pe W And/or Wo Contrast  Result Date: 10/28/2016 CLINICAL DATA:  Chest pain and syncope. Recent travel history. Immunocompromised. EXAM: CT ANGIOGRAPHY CHEST WITH CONTRAST TECHNIQUE: Multidetector CT imaging of the chest was performed using the standard protocol during bolus administration of intravenous contrast. Multiplanar CT image reconstructions and MIPs were obtained to evaluate the vascular anatomy. CONTRAST:  100 cc Isovue 370 IV COMPARISON:  CXR 10/27/2016 FINDINGS: Cardiovascular: Normal branch pattern of the great vessels off  the aortic arch. No aortic aneurysm or dissection. The main pulmonary artery is slightly dilated up to 3 cm consistent with pulmonary hypertension. No pulmonary embolus. Heart is normal in size without pericardial effusion. Mediastinum/Nodes: Debris and fluid-filled distention of the esophagus to the proximal third. Mild diffuse thickening of the esophagus is also noted question esophagitis. No lymphadenopathy. No thyromegaly or nodules. Lungs/Pleura: Lungs are clear. No pleural effusion or pneumothorax. Upper Abdomen: No acute abnormality. Slightly thick-walled appearance of the stomach is nonspecific but likely due to underdistention. Musculoskeletal: No chest wall abnormality. No acute or significant osseous findings. Review of the MIP images confirms the above findings. IMPRESSION: 1. No acute pulmonary embolus. Mild dilatation of the main pulmonary artery to 3 cm which may reflect pulmonary hypertension. 2. No pneumonic consolidation or CHF. 3. Debris and fluid-filled distention of the esophagus to the proximal third. Mild thickening of the esophagus is also noted. Query esophagitis. Electronically Signed   By: Ashley Royalty M.D.   On: 10/28/2016 01:51    EKG: Independently reviewed. Vent. rate 112 BPM PR interval 176 ms QRS duration 112 ms QT/QTc 388/529 ms P-R-T axes 72 84 64 Sinus tachycardia Right bundle branch block ST elevation consider anterior injury or acute infarct Abnormal ECG ST elevation on V3. Resolved on follow up tracing per ED provider. Follow up EKG still not available in the system.  Assessment/Plan Principal Problem:   UTI (urinary tract infection) Previous UC&S in August 2016 showed Escherichia coli resistant to ampicillin and Bactrim. Admit to telemetry/inpatient. Continue IV fluids. Continue IV ceftriaxone. Follow-up urine culture and sensitivity. Follow-up blood cultures and sensitivity.  Active Problems:   Chest pain Pain is atypical. I suspect esophagitis  since the patient states that the pain is similar to what she experienced last year when she had Candida esophagitis. Continue liquid nystatin, particularly while on antibacterial IV antibiotics. Will trend troponin and check echocardiogram.     AIDS (HCC)/Human immunodeficiency virus I infection ([place]) Currently not taking antiretroviral medications. I stressed the importance of compliance with HAART and its effect on CD4 count and VL. Per patient, she is is scheduled to see a new provider (she believes it might be Dr. Linus Salmons) and will resume medications once discharged.    Hypokalemia Replacing. Follow-up potassium level. Check magnesium and phosphorus level.    Anemia Monitor hematocrit and hemoglobin.  DVT prophylaxis: Heparin SQ. Code Status: Full code. Family Communication: Her daughter was present in her room. Disposition Plan: Admit for IV ceftriaxone for two to three days. Consults called:  Admission status: Inpatient/Telemetry.   Reubin Milan MD [facility] Pager [phone].  If 7PM-7AM, please contact night-coverage [email or web address] Password [facility]  10/28/2016, 4:43 AM

## 2016-10-28 NOTE — Progress Notes (Signed)
CSW received consult regarding emergency Medicaid. CSW alerted patient and her daughter that we are unable to submit an emergency medicaid application (confirmed by financial counseling). CSW provided Medicaid and Disability applications. Patient's daughter also reported that they would be homeless after leaving the hospital. CSW provided emergency shelter resources as well.  CSW signing off.  Percell Locus Brittish Bolinger LCSWA [phone]

## 2016-10-28 NOTE — Progress Notes (Signed)
[facility] notified that patient is having 10/10 chest pain with nausea and no BM in 3 weeks vitial signs hypertensive charted in epic. Willcontinue to monitor. Arthor Captain LPN

## 2016-10-28 NOTE — Progress Notes (Signed)
PROGRESS NOTE  Subjective: Waylon Koffler Kitagawa is a 46 y.o. female with a history of AIDS (last CD4 count 30) who presented for chest/throat pain and nausea for several days. Pain is described as burning in upper mid-chest/throat radiating down midchest into epigastrium, severe, intermittent worst with swallowing, and not changed with respiration, exertion, or position. On evaluation urinalysis showed bacteriuria and pyuria, WBC 4.2, afebrile. She was hypokalemic at 2.7 and appeared thin in the setting of reported months of very limited per oral intake due to odynophagia. CTA chest whoed proximal esophageal dilation with wall thickening. She was admitted early this morning, started on IV ceftriaxone. ACS rule out   Objective: BP 113/62 (BP Location: Right Arm)   Pulse 78   Temp 98.4 F (36.9 C)   Resp 18   Ht 5\' 1"  (1.549 m)   Wt 43.1 kg (95 lb)   LMP 10/27/2016 (Exact Date)   SpO2 99%   BMI 17.95 kg/m   Gen: Thin female in no distress Pulm: Clear and nonlabored on room air  CV: RRR, no murmur, no JVD, no edema GI: Soft, NT, ND, +BS  Neuro: Alert and oriented. No focal deficits. Skin: No rashes, lesions no ulcers  Assessment & Plan: UTI: Previous UC&S in August 2016 showed Escherichia coli resistant to ampicillin and Bactrim. - Continue IV ceftriaxone given inability to tolerate per oral intake.  - Continue IV fluids - Follow up urine and blood cultures.   Esophagitis: Most likely cause of chest pain given symptomatology and history of immunocompromise. Has history of same presentation Oct 2017 where EGD confirmed significant fungal infection. Troponin negative, ECG showed sinus tachycardia without ischemic changes.  - Will give fluconazole solution x14-21 days - Viscous lidocaine prn   AIDS: Last CD4 30, consistent with prior values. Has been off HAART (previously odefsy). Had appt to be seen in [facility] 7/17.  - Discussed with Dr. Linus Salmons who advises [facility] follow up for initiation of  ART. He will see patient 7/18.  - Will need antimicrobial prophylaxis as well.   Hypokalemia and hypomagnesemia: In setting of poor per oral intake.  - Replaced at admission, recheck in AM  Severe malnutrition: Pt endorses significant unintentional weight loss from poor appetite and more recently odynophagia.  - Nutrition consulted  Iron deficiency anemia: Attributed to menstrual bleeding, likely also inadequate dietary intake.   - Monitor CBC.  - Consider IV iron pending iron studies.  Vance Gather, MD [facility] Pager [phone] 10/28/2016, 4:53 PM

## 2016-10-28 NOTE — Discharge Instructions (Signed)

## 2016-10-29 DIAGNOSIS — Z809 Family history of malignant neoplasm, unspecified: Secondary | ICD-10-CM

## 2016-10-29 DIAGNOSIS — Z8349 Family history of other endocrine, nutritional and metabolic diseases: Secondary | ICD-10-CM

## 2016-10-29 DIAGNOSIS — K209 Esophagitis, unspecified: Secondary | ICD-10-CM

## 2016-10-29 DIAGNOSIS — Z8249 Family history of ischemic heart disease and other diseases of the circulatory system: Secondary | ICD-10-CM

## 2016-10-29 DIAGNOSIS — R079 Chest pain, unspecified: Secondary | ICD-10-CM

## 2016-10-29 DIAGNOSIS — N390 Urinary tract infection, site not specified: Principal | ICD-10-CM

## 2016-10-29 DIAGNOSIS — B20 Human immunodeficiency virus [HIV] disease: Secondary | ICD-10-CM

## 2016-10-29 DIAGNOSIS — Z792 Long term (current) use of antibiotics: Secondary | ICD-10-CM

## 2016-10-29 LAB — BASIC METABOLIC PANEL
ANION GAP: 6 (ref 5–15)
CALCIUM: 8 mg/dL — AB (ref 8.9–10.3)
CO2: 24 mmol/L (ref 22–32)
Chloride: 114 mmol/L — ABNORMAL HIGH (ref 101–111)
Creatinine, Ser: 0.58 mg/dL (ref 0.44–1.00)
GFR calc Af Amer: >60 mL/min (ref >60)
GFR calc non Af Amer: >60 mL/min (ref >60)
GLUCOSE: 108 mg/dL — AB (ref 65–99)
Potassium: 2.8 mmol/L — ABNORMAL LOW (ref 3.5–5.1)
Sodium: 144 mmol/L (ref 135–145)

## 2016-10-29 LAB — IRON AND TIBC
IRON: 34 ug/dL (ref 28–170)
SATURATION RATIOS: 15 % (ref 10.4–31.8)
TIBC: 221 ug/dL — AB (ref 250–450)
UIBC: 187 ug/dL

## 2016-10-29 LAB — RETICULOCYTES
RBC.: 3.8 MIL/uL — ABNORMAL LOW (ref 3.87–5.11)
RETIC CT PCT: 0.7 % (ref 0.4–3.1)
Retic Count, Absolute: 26.6 10*3/uL (ref 19.0–186.0)

## 2016-10-29 LAB — FERRITIN: FERRITIN: 25 ng/mL (ref 11–307)

## 2016-10-29 LAB — CBC
HEMATOCRIT: 27.3 % — AB (ref 36.0–46.0)
Hemoglobin: 8.5 g/dL — ABNORMAL LOW (ref 12.0–15.0)
MCH: 22.4 pg — ABNORMAL LOW (ref 26.0–34.0)
MCHC: 31.1 g/dL (ref 30.0–36.0)
MCV: 71.8 fL — AB (ref 78.0–100.0)
PLATELETS: 257 10*3/uL (ref 150–400)
RBC: 3.8 MIL/uL — ABNORMAL LOW (ref 3.87–5.11)
RDW: 20.4 % — AB (ref 11.5–15.5)
WBC: 2.5 10*3/uL — AB (ref 4.0–10.5)

## 2016-10-29 LAB — MAGNESIUM: Magnesium: 1.8 mg/dL (ref 1.7–2.4)

## 2016-10-29 MED ORDER — POTASSIUM CHLORIDE 10 MEQ/100ML IV SOLN
10.0000 meq | INTRAVENOUS | Status: AC
  Administered 2016-10-29 (×4): 10 meq via INTRAVENOUS
  Filled 2016-10-29 (×4): qty 100

## 2016-10-29 MED ORDER — SULFAMETHOXAZOLE-TRIMETHOPRIM 200-40 MG/5ML PO SUSP
20.0000 mL | Freq: Every day | ORAL | Status: DC
  Administered 2016-10-29 – 2016-10-31 (×3): 20 mL via ORAL
  Filled 2016-10-29 (×4): qty 20

## 2016-10-29 MED ORDER — POTASSIUM CHLORIDE 20 MEQ PO PACK
40.0000 meq | PACK | Freq: Two times a day (BID) | ORAL | Status: DC
  Filled 2016-10-29 (×2): qty 2

## 2016-10-29 NOTE — Consult Note (Signed)
[facility] for Infectious Disease    Date of Admission:  10/27/2016    Total days of antibiotics 3         Day 3 of ceftriaxone         Day 2 of fluconazole               Reason for Consult: Uncontrolled HIV complicated with esophageal candidiasis and UTI.    Referring Provider: Vance Gather MD Primary Care Provider: Neva Seat  Assessment: Esophageal candidiasis. Most likely the cause of her current symptoms, she needs to be restarted on HART. She needs to follow-up at infectious disease clinic as soon as she can be discharged. From ID standpoint she can be discharged today and can be seen at ID clinic if she is discharged before 4.00 PM today or tomorrow morning.  Plan: 1. Continue fluconazole for 3 weeks. 2. Start her on Bactrim daily. 3. Treat for UTI for 3 days. 4. Can be discharged today with a follow-up at ID clinic to restart of her HART.  Principal Problem:   UTI (urinary tract infection) Active Problems:   Human immunodeficiency virus I infection (HCC)   Chest pain   AIDS (HCC)   Hypokalemia   . feeding supplement  1 Container Oral Q24H  . feeding supplement (ENSURE ENLIVE)  237 mL Oral BID BM  . fluconazole  400 mg Oral Daily  . heparin  5,000 Units Subcutaneous Q8H  . potassium chloride  40 mEq Oral BID    HPI: Shirley White is a 46 y.o. female with PMHx of uncontrolled HIV, non compliant with HART, last CD4 was 30 in 10/17, she is off of her HIV treatment for more than a month now presented to ED with worsening odynophagia and chest pain. ACS was ruled out.  Patient was initially treated with [facility] which she  just took it for less than a month and stopped taking it because of nausea and vomiting. She was last seen in ID clinic by Dr. Linus Salmons in February 2018 and was placed on Descovy and Tivicay along with Bactrim. Her last CD count check was in October 2017 which was 30, she never had any labs since then. She moved to [place] in  May 2018 and according to patient she was seen at an HIV clinic there and was advised to stop medication, plan was to restart  after some lab work, patient never followed up and stopped taking all her HIV meds.She moved back to [place] and willing to restart her therapy.  Patient was complaining of worsening odynophagia for more than a month, she was unable to tolerate any by mouth intake resulted in a weight loss of approximately 20 pounds over 1-2 month. She was also complaining of some nausea and vomiting. She was also having burning micturition, dysuria and increased urinary frequency and urgency for 1 week, UA was positive for pyuria and hematuria, cultures still pending, she was given 2 doses of ceftriaxone stating that her urinary symptoms has been resolved.  ID was consulted for her HIV status.   Review of Systems: ROS  Past Medical History:  Diagnosis Date  . Anemia   . GERD (gastroesophageal reflux disease)   . HIV disease ([place])   . Hypertension     Social History  Substance Use Topics  . Smoking status: Never Smoker  . Smokeless tobacco: Never Used  . Alcohol use 0.6 oz/week    1 Cans  of beer per week     Comment: occa    Family History  Problem Relation Age of Onset  . Hyperlipidemia Mother   . Hypertension Mother   . Cancer Father    No Known Allergies  OBJECTIVE: Blood pressure 139/70, pulse 70, temperature 98.3 F (36.8 C), temperature source Oral, resp. rate 18, height 5\' 1"  (1.549 m), weight 95 lb (43.1 kg), last menstrual period 10/27/2016, SpO2 100 %.  Physical Exam   Vitals:   10/28/16 2117 10/28/16 2120 10/28/16 2124 10/29/16 0507  BP: (!) 161/104 (!) 155/97 (!) 148/99 139/70  Pulse:    70  Resp:    18  Temp:    98.3 F (36.8 C)  TempSrc:    Oral  SpO2:    100%  Weight:      Height:       General: Vital signs reviewed.  Patient is well-developed and well-nourished, in no acute distress and cooperative with exam.  Cardiovascular: RRR,  S1 normal, S2 normal, no murmurs, gallops, or rubs. Pulmonary/Chest: Clear to auscultation bilaterally, no wheezes, rales, or rhonchi. Abdominal: Soft, non-tender, non-distended, BS +, no masses, organomegaly, or guarding present.  Extremities: No lower extremity edema bilaterally,  pulses symmetric and intact bilaterally. No cyanosis or clubbing. Skin: Warm, dry and intact. No rashes or erythema. Psychiatric: Normal mood and affect. speech and behavior is normal. Cognition and memory are normal.  Lab Results Lab Results  Component Value Date   WBC 2.5 (L) 10/29/2016   HGB 8.5 (L) 10/29/2016   HCT 27.3 (L) 10/29/2016   MCV 71.8 (L) 10/29/2016   PLT 257 10/29/2016    Lab Results  Component Value Date   CREATININE 0.58 10/29/2016   BUN <5 (L) 10/29/2016   NA 144 10/29/2016   K 2.8 (L) 10/29/2016   CL 114 (H) 10/29/2016   CO2 24 10/29/2016    Lab Results  Component Value Date   ALT 9 (L) 10/27/2016   AST 18 10/27/2016   ALKPHOS 60 10/27/2016   BILITOT 0.8 10/27/2016     Microbiology: Recent Results (from the past 240 hour(s))  Culture, blood (Routine X 2) w Reflex to ID Panel     Status: None (Preliminary result)   Collection Time: 10/27/16  9:51 PM  Result Value Ref Range Status   Specimen Description BLOOD LEFT HAND  Final   Special Requests   Final    BOTTLES DRAWN AEROBIC AND ANAEROBIC Blood Culture adequate volume   Culture NO GROWTH < 24 HOURS  Final   Report Status PENDING  Incomplete  Culture, blood (Routine X 2) w Reflex to ID Panel     Status: None (Preliminary result)   Collection Time: 10/27/16  9:51 PM  Result Value Ref Range Status   Specimen Description BLOOD RIGHT HAND  Final   Special Requests   Final    BOTTLES DRAWN AEROBIC AND ANAEROBIC Blood Culture adequate volume   Culture NO GROWTH < 24 HOURS  Final   Report Status PENDING  Incomplete    Lorella Nimrod, MD [place] [facility] [place] [facility] [phone] pager    [phone] cell 10/29/2016, 10:47 AM

## 2016-10-29 NOTE — Progress Notes (Signed)
PROGRESS NOTE    Teala Daffron Jurado  TDD:[id number] DOB: 1970-05-18 DOA: 10/27/2016 PCP: Neva Seat, MD    Brief Narrative:  Shirley White is a 46 y.o. female with a history of AIDS (last CD4 count 30) who presented for chest/throat pain and nausea for several days. Pain is described as burning in upper mid-chest/throat radiating down midchest into epigastrium, severe, intermittent worst with swallowing, and not changed with respiration, exertion, or position. On evaluation urinalysis showed bacteriuria and pyuria, WBC 4.2, afebrile.. Patient has CTA chest which showed proximal esophageal dilatation with wall thickening. Patient was started on ceftriaxone   Assessment & Plan:   Principal Problem:   UTI (urinary tract infection) - reviewed by ID and they are recommending no antibiotics for asymptomatic UTI   HIV - poor compliance in the past. ID on board and currently trying to place patient back on antiretroviral therapy. Biktarvy or Tivicay and descovy. - Daughter reports patient has had problems with swallowing pills. This was discussed with the pharmacist and pending final recommendations by ER we will try to assist to see if we can get liquid form or crushable pills.  Active Problems:  Hypokalemia - Patient does not like oral replacement. Will replace IV and reassess next a.m. suspect secondary to poor oral intake from esophagitis    Chest pain  - Most likely due to esophagitis - Troponin negative  Esophagitis - ID assisting and plan is to continue fluconazole for 14 days total.   AIDS ([place]) - Patient is placed on Bactrim DS 1 tab daily for opportunistic infection prophylaxis   DVT prophylaxis: Heparin Code Status: Full Family Communication: Discussed with daughter at bedside Disposition Plan: Once improved oral intake, potassium levels   Consultants:   Infectious disease   Procedures: None   Antimicrobials: Bactrim   Subjective: Pt has no new  complaints.   Objective: Vitals:   10/28/16 2120 10/28/16 2124 10/29/16 0507 10/29/16 1507  BP: (!) 155/97 (!) 148/99 139/70 (!) 142/106  Pulse:   70 72  Resp:   18 17  Temp:   98.3 F (36.8 C) 99 F (37.2 C)  TempSrc:   Oral Oral  SpO2:   100% 100%  Weight:      Height:        Intake/Output Summary (Last 24 hours) at 10/29/16 1626 Last data filed at 10/29/16 1616  Gross per 24 hour  Intake           3102.5 ml  Output             2100 ml  Net           1002.5 ml   Filed Weights   10/27/16 1950  Weight: 43.1 kg (95 lb)    Examination:  General exam: Appears calm and comfortable, in nad. Respiratory system: Clear to auscultation. Respiratory effort normal. Equal chest rise. Cardiovascular system: S1 & S2 heard, RRR. No JVD, murmurs, rubs, gallops or clicks. No pedal edema. Gastrointestinal system: Abdomen is nondistended, soft and nontender. No organomegaly or masses felt. Normal bowel sounds heard. Central nervous system: Alert and oriented. No focal neurological deficits. Extremities: Symmetric 5 x 5 power. Skin: No rashes, lesions or ulcers, on limited exam. Psychiatry: Mood & affect appropriate.   Data Reviewed: I have personally reviewed following labs and imaging studies  CBC:  Recent Labs Lab 10/27/16 1950 10/28/16 0425 10/29/16 0341  WBC 4.2 4.7 2.5*  NEUTROABS  --  3.0  --   HGB 10.9*  10.0* 8.5*  HCT 35.5* 31.4* 27.3*  MCV 70.9* 71.5* 71.8*  PLT 383 321 161   Basic Metabolic Panel:  Recent Labs Lab 10/27/16 1950 10/28/16 0425 10/29/16 0341  NA 141 146* 144  K 2.7* 2.8* 2.8*  CL 102 114* 114*  CO2 26 22 24   GLUCOSE 84 82 108*  BUN 5* <5* <5*  CREATININE 0.64 0.57 0.58  CALCIUM 9.3 8.3* 8.0*  MG  --  1.5* 1.8  PHOS  --  2.1*  --    GFR: Estimated Creatinine Clearance: 61.1 mL/min (by C-G formula based on SCr of 0.58 mg/dL). Liver Function Tests:  Recent Labs Lab 10/27/16 1950  AST 18  ALT 9*  ALKPHOS 60  BILITOT 0.8  PROT 9.5*    ALBUMIN 3.4*    Recent Labs Lab 10/27/16 1950  LIPASE 36   No results for input(s): AMMONIA in the last 168 hours. Coagulation Profile: No results for input(s): INR, PROTIME in the last 168 hours. Cardiac Enzymes:  Recent Labs Lab 10/27/16 1950 10/28/16 0453  TROPONINI <0.03 <0.03   BNP (last 3 results) No results for input(s): PROBNP in the last 8760 hours. HbA1C: No results for input(s): HGBA1C in the last 72 hours. CBG: No results for input(s): GLUCAP in the last 168 hours. Lipid Profile: No results for input(s): CHOL, HDL, LDLCALC, TRIG, CHOLHDL, LDLDIRECT in the last 72 hours. Thyroid Function Tests: No results for input(s): TSH, T4TOTAL, FREET4, T3FREE, THYROIDAB in the last 72 hours. Anemia Panel:  Recent Labs  10/29/16 0341  FERRITIN 25  TIBC 221*  IRON 34  RETICCTPCT 0.7   Sepsis Labs:  Recent Labs Lab 10/27/16 2213  LATICACIDVEN 2.81*    Recent Results (from the past 240 hour(s))  Urine culture     Status: Abnormal (Preliminary result)   Collection Time: 10/27/16  9:34 PM  Result Value Ref Range Status   Specimen Description URINE, RANDOM  Final   Special Requests NONE  Final   Culture (A)  Final    >=100,000 COLONIES/mL ESCHERICHIA COLI SUSCEPTIBILITIES TO FOLLOW    Report Status PENDING  Incomplete  Culture, blood (Routine X 2) w Reflex to ID Panel     Status: None (Preliminary result)   Collection Time: 10/27/16  9:51 PM  Result Value Ref Range Status   Specimen Description BLOOD LEFT HAND  Final   Special Requests   Final    BOTTLES DRAWN AEROBIC AND ANAEROBIC Blood Culture adequate volume   Culture NO GROWTH 2 DAYS  Final   Report Status PENDING  Incomplete  Culture, blood (Routine X 2) w Reflex to ID Panel     Status: None (Preliminary result)   Collection Time: 10/27/16  9:51 PM  Result Value Ref Range Status   Specimen Description BLOOD RIGHT HAND  Final   Special Requests   Final    BOTTLES DRAWN AEROBIC AND ANAEROBIC Blood  Culture adequate volume   Culture NO GROWTH 2 DAYS  Final   Report Status PENDING  Incomplete         Radiology Studies: Dg Chest 2 View  Result Date: 10/27/2016 CLINICAL DATA:  46 year old female chest pain. EXAM: CHEST  2 VIEW COMPARISON:  Chest radiograph dated 02/02/2016 FINDINGS: The heart size and mediastinal contours are within normal limits. Both lungs are clear. The visualized skeletal structures are unremarkable. IMPRESSION: No active cardiopulmonary disease. Electronically Signed   By: Anner Crete M.D.   On: 10/27/2016 21:09   Ct Angio Chest Pe  W And/or Wo Contrast  Result Date: 10/28/2016 CLINICAL DATA:  Chest pain and syncope. Recent travel history. Immunocompromised. EXAM: CT ANGIOGRAPHY CHEST WITH CONTRAST TECHNIQUE: Multidetector CT imaging of the chest was performed using the standard protocol during bolus administration of intravenous contrast. Multiplanar CT image reconstructions and MIPs were obtained to evaluate the vascular anatomy. CONTRAST:  100 cc Isovue 370 IV COMPARISON:  CXR 10/27/2016 FINDINGS: Cardiovascular: Normal branch pattern of the great vessels off the aortic arch. No aortic aneurysm or dissection. The main pulmonary artery is slightly dilated up to 3 cm consistent with pulmonary hypertension. No pulmonary embolus. Heart is normal in size without pericardial effusion. Mediastinum/Nodes: Debris and fluid-filled distention of the esophagus to the proximal third. Mild diffuse thickening of the esophagus is also noted question esophagitis. No lymphadenopathy. No thyromegaly or nodules. Lungs/Pleura: Lungs are clear. No pleural effusion or pneumothorax. Upper Abdomen: No acute abnormality. Slightly thick-walled appearance of the stomach is nonspecific but likely due to underdistention. Musculoskeletal: No chest wall abnormality. No acute or significant osseous findings. Review of the MIP images confirms the above findings. IMPRESSION: 1. No acute pulmonary  embolus. Mild dilatation of the main pulmonary artery to 3 cm which may reflect pulmonary hypertension. 2. No pneumonic consolidation or CHF. 3. Debris and fluid-filled distention of the esophagus to the proximal third. Mild thickening of the esophagus is also noted. Query esophagitis. Electronically Signed   By: Ashley Royalty M.D.   On: 10/28/2016 01:51        Scheduled Meds: . feeding supplement  1 Container Oral Q24H  . feeding supplement (ENSURE ENLIVE)  237 mL Oral BID BM  . fluconazole  400 mg Oral Daily  . heparin  5,000 Units Subcutaneous Q8H  . sulfamethoxazole-trimethoprim  20 mL Oral Daily   Continuous Infusions: . 0.9 % NaCl with KCl 20 mEq / L 125 mL/hr at 10/29/16 1321  . potassium chloride 10 mEq (10/29/16 1610)     LOS: 1 day    Time spent: > 35 min    Velvet Bathe, MD [facility] Pager [phone]  If 7PM-7AM, please contact night-coverage [email or web address] Password [facility] 10/29/2016, 4:26 PM

## 2016-10-30 LAB — BASIC METABOLIC PANEL
ANION GAP: 4 — AB (ref 5–15)
BUN: <5 mg/dL — ABNORMAL LOW (ref 6–20)
CALCIUM: 7.7 mg/dL — AB (ref 8.9–10.3)
CO2: 22 mmol/L (ref 22–32)
Chloride: 116 mmol/L — ABNORMAL HIGH (ref 101–111)
Creatinine, Ser: 0.5 mg/dL (ref 0.44–1.00)
GFR calc non Af Amer: >60 mL/min (ref >60)
GLUCOSE: 88 mg/dL (ref 65–99)
POTASSIUM: 3.7 mmol/L (ref 3.5–5.1)
Sodium: 142 mmol/L (ref 135–145)

## 2016-10-30 LAB — URINE CULTURE: Culture: >=100000 — AB

## 2016-10-30 NOTE — Progress Notes (Signed)
PROGRESS NOTE    Shirley White  QMV:[id number] DOB: 05/28/1970 DOA: 10/27/2016 PCP: Neva Seat, MD    Brief Narrative:  Shirley White is a 46 y.o. female with a history of AIDS (last CD4 count 30) who presented for chest/throat pain and nausea for several days. Pain is described as burning in upper mid-chest/throat radiating down midchest into epigastrium, severe, intermittent worst with swallowing, and not changed with respiration, exertion, or position. On evaluation urinalysis showed bacteriuria and pyuria, WBC 4.2, afebrile.. Patient has CTA chest which showed proximal esophageal dilatation with wall thickening.  The patient was started on fluconazole.    Assessment & Plan:   Principal Problem:   UTI (urinary tract infection) - reviewed by ID and they are recommending no antibiotics for asymptomatic UTI. As such ceftriaxone discontinued  HIV - poor compliance in the past. ID on board and currently trying to place patient back on antiretroviral therapy. Biktarvy or Tivicay and descovy. - As per discussion with daughter patient has difficulty swallowing pills and any recommended therapy should be able to be either liquid or crushable.  Active Problems:  Hypokalemia - Within normal limits on last check    Chest pain  - Most likely due to esophagitis, improving - Troponin negative  Esophagitis - ID assisting and plan is to continue fluconazole for 14 days total.   AIDS ([place]) - Patient is placed on Bactrim DS 1 tab daily for opportunistic infection prophylaxis   DVT prophylaxis: Heparin Code Status: Full Family Communication: Discussed with daughter at bedside Disposition Plan: Once improved oral intake and HIV medication regimen delineated   Consultants:   Infectious disease   Procedures: None   Antimicrobials: Bactrim   Subjective: Pt states that her oral intake is improved but not at baseline.  Objective: Vitals:   10/29/16 1507 10/29/16  2131 10/30/16 0605 10/30/16 1523  BP: (!) 142/106 (!) 137/100 (!) 146/85 (!) 149/73  Pulse: 72 76 73 75  Resp: 17  18 20   Temp: 99 F (37.2 C) 98.3 F (36.8 C) 98.2 F (36.8 C) 97.7 F (36.5 C)  TempSrc: Oral Oral Oral   SpO2: 100% 100% 100% 100%  Weight:      Height:        Intake/Output Summary (Last 24 hours) at 10/30/16 1614 Last data filed at 10/30/16 1330  Gross per 24 hour  Intake           3455.5 ml  Output             5150 ml  Net          -1694.5 ml   Filed Weights   10/27/16 1950  Weight: 43.1 kg (95 lb)    Examination:  Physical exam unchanged from 10/29/2016  General exam: Appears calm and comfortable, in nad. Respiratory system: Clear to auscultation. Respiratory effort normal. Equal chest rise. Cardiovascular system: S1 & S2 heard, RRR. No JVD, murmurs, rubs, gallops or clicks. No pedal edema. Gastrointestinal system: Abdomen is nondistended, soft and nontender. No organomegaly or masses felt. Normal bowel sounds heard. Central nervous system: Alert and oriented. No focal neurological deficits. Extremities: Symmetric 5 x 5 power. Skin: No rashes, lesions or ulcers, on limited exam. Psychiatry: Mood & affect appropriate.   Data Reviewed: I have personally reviewed following labs and imaging studies  CBC:  Recent Labs Lab 10/27/16 1950 10/28/16 0425 10/29/16 0341  WBC 4.2 4.7 2.5*  NEUTROABS  --  3.0  --   HGB 10.9* 10.0*  8.5*  HCT 35.5* 31.4* 27.3*  MCV 70.9* 71.5* 71.8*  PLT 383 321 384   Basic Metabolic Panel:  Recent Labs Lab 10/27/16 1950 10/28/16 0425 10/29/16 0341 10/30/16 0236  NA 141 146* 144 142  K 2.7* 2.8* 2.8* 3.7  CL 102 114* 114* 116*  CO2 26 22 24 22   GLUCOSE 84 82 108* 88  BUN 5* <5* <5* <5*  CREATININE 0.64 0.57 0.58 0.50  CALCIUM 9.3 8.3* 8.0* 7.7*  MG  --  1.5* 1.8  --   PHOS  --  2.1*  --   --    GFR: Estimated Creatinine Clearance: 61.1 mL/min (by C-G formula based on SCr of 0.5 mg/dL). Liver Function  Tests:  Recent Labs Lab 10/27/16 1950  AST 18  ALT 9*  ALKPHOS 60  BILITOT 0.8  PROT 9.5*  ALBUMIN 3.4*    Recent Labs Lab 10/27/16 1950  LIPASE 36   No results for input(s): AMMONIA in the last 168 hours. Coagulation Profile: No results for input(s): INR, PROTIME in the last 168 hours. Cardiac Enzymes:  Recent Labs Lab 10/27/16 1950 10/28/16 0453  TROPONINI <0.03 <0.03   BNP (last 3 results) No results for input(s): PROBNP in the last 8760 hours. HbA1C: No results for input(s): HGBA1C in the last 72 hours. CBG: No results for input(s): GLUCAP in the last 168 hours. Lipid Profile: No results for input(s): CHOL, HDL, LDLCALC, TRIG, CHOLHDL, LDLDIRECT in the last 72 hours. Thyroid Function Tests: No results for input(s): TSH, T4TOTAL, FREET4, T3FREE, THYROIDAB in the last 72 hours. Anemia Panel:  Recent Labs  10/29/16 0341  FERRITIN 25  TIBC 221*  IRON 34  RETICCTPCT 0.7   Sepsis Labs:  Recent Labs Lab 10/27/16 2213  LATICACIDVEN 2.81*    Recent Results (from the past 240 hour(s))  Urine culture     Status: Abnormal   Collection Time: 10/27/16  9:34 PM  Result Value Ref Range Status   Specimen Description URINE, RANDOM  Final   Special Requests NONE  Final   Culture >=100,000 COLONIES/mL ESCHERICHIA COLI (A)  Final   Report Status 10/30/2016 FINAL  Final   Organism ID, Bacteria ESCHERICHIA COLI (A)  Final      Susceptibility   Escherichia coli - MIC*    AMPICILLIN >=32 RESISTANT Resistant     CEFAZOLIN >=64 RESISTANT Resistant     CEFTRIAXONE <=1 SENSITIVE Sensitive     CIPROFLOXACIN <=0.25 SENSITIVE Sensitive     GENTAMICIN >=16 RESISTANT Resistant     IMIPENEM <=0.25 SENSITIVE Sensitive     NITROFURANTOIN <=16 SENSITIVE Sensitive     TRIMETH/SULFA >=320 RESISTANT Resistant     AMPICILLIN/SULBACTAM >=32 RESISTANT Resistant     PIP/TAZO 64 INTERMEDIATE Intermediate     Extended ESBL NEGATIVE Sensitive     * >=100,000 COLONIES/mL ESCHERICHIA  COLI  Culture, blood (Routine X 2) w Reflex to ID Panel     Status: None (Preliminary result)   Collection Time: 10/27/16  9:51 PM  Result Value Ref Range Status   Specimen Description BLOOD LEFT HAND  Final   Special Requests   Final    BOTTLES DRAWN AEROBIC AND ANAEROBIC Blood Culture adequate volume   Culture NO GROWTH 3 DAYS  Final   Report Status PENDING  Incomplete  Culture, blood (Routine X 2) w Reflex to ID Panel     Status: None (Preliminary result)   Collection Time: 10/27/16  9:51 PM  Result Value Ref Range Status  Specimen Description BLOOD RIGHT HAND  Final   Special Requests   Final    BOTTLES DRAWN AEROBIC AND ANAEROBIC Blood Culture adequate volume   Culture NO GROWTH 3 DAYS  Final   Report Status PENDING  Incomplete         Radiology Studies: No results found.      Scheduled Meds: . feeding supplement  1 Container Oral Q24H  . feeding supplement (ENSURE ENLIVE)  237 mL Oral BID BM  . fluconazole  400 mg Oral Daily  . heparin  5,000 Units Subcutaneous Q8H  . sulfamethoxazole-trimethoprim  20 mL Oral Daily   Continuous Infusions: . 0.9 % NaCl with KCl 20 mEq / L 125 mL/hr at 10/30/16 0651     LOS: 2 days    Time spent: > 36 min Velvet Bathe, MD [facility] Pager [phone]  If 7PM-7AM, please contact night-coverage [email or web address] Password [email or web address] 10/30/2016, 4:14 PM

## 2016-10-31 MED ORDER — SULFAMETHOXAZOLE-TRIMETHOPRIM 200-40 MG/5ML PO SUSP: 20.0000 mL | Freq: Every day | ORAL | 0 refills | Status: DC

## 2016-10-31 MED ORDER — FLUCONAZOLE 200 MG PO TABS: 400.0000 mg | ORAL_TABLET | Freq: Every day | ORAL | 0 refills | Status: AC

## 2016-10-31 NOTE — Care Management Note (Addendum)
Case Management Note  Patient Details  Name: Dalena Plantz Lancour MRN: [id number] Date of Birth: [birth year]  Subjective/Objective:         UTI, HIV, chest pain, AIDS, Hypokalemia           Action/Plan: Discharge Planning: NCM spoke to pt and dtr, Dillard Cannon # [phone] at bedside. Provided pt with brochure for [facility] to call on Monday to arrange follow up appt. Explained she can utilize pharmacy to pick up meds. Pt had Bactrim but will need to pick up Diflucan. Contacted [facility] pharmacy and they do have in [place]. Pt states she will not be able to pick up until Monday.   Pt does not qualify for MATCH, she utilized on 01/2016.  Expected Discharge Date:  10/31/16               Expected Discharge Plan:  Home/Self Care  In-House Referral:  NA  Discharge planning Services  CM Consult, Medication Assistance, [facility]  Post Acute Care Choice:  NA Choice offered to:  NA  DME Arranged:  N/A DME Agency:  NA  HH Arranged:  NA HH Agency:  NA  Status of Service:  Completed, signed off  If discussed at [place] of Stay Meetings, dates discussed:    Additional Comments:  Erenest Rasher, RN 10/31/2016, 3:45 PM

## 2016-10-31 NOTE — Discharge Summary (Signed)
Physician Discharge Summary  Shirley White OVZ:[id number] DOB: 1970/10/04 DOA: 10/27/2016  PCP: Neva Seat, MD  Admit date: 10/27/2016 Discharge date: 10/31/2016  Time spent: > 35 minutes  Recommendations for Outpatient Follow-up:  1. Please ensure patient follows up with infectious disease specialist 2. 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 female with medical history significant of anemia, HIV disease/AIDS last CD4 was 30, off antiretrovirals for the past 3 weeks, who recently moved from [place] to this area about a week ago and was seen that day by the ED for nausea and emesis. Discharge home on Zofran, nystatin, pantoprazole and Bactrim for PCP prophylaxis who is returning today with complaints of worsening chest pain, persistent nausea, subjective fever for several days.  Patient diagnosed with esophagitis and improved on fluconazole  Hospital Course:  Esophagitis - With source of chest pain. Resolving with fluconazole - Oral intake improved on day of discharge  HIV - Patient to follow-up with ID shortly after discharge for further evaluation recommendations. She is to call office and discuss follow-up appointment time and date - Continue Bactrim for prophylaxis of opportunistic infections.  Suspected malnutrition - Secondary to principal problem above patient may continue ensure on discharge  Procedures:  None  Consultations:  Infectious disease specialist  Discharge Exam: Vitals:   10/31/16 0512 10/31/16 0801  BP: 116/66 (!) 173/92  Pulse: 72 77   Resp: 18 20  Temp: 98.4 F (36.9 C) 98.3 F (36.8 C)    General: Patient in no acute distress, alert and awake Cardiovascular: No cyanosis Respiratory: No increased work of breathing, equal chest rise  Discharge Instructions   Discharge Instructions    Call MD for:  difficulty breathing, headache or visual disturbances    Complete by:  As directed    Call MD for:  temperature >100.4    Complete by:  As directed    Diet - low sodium heart healthy    Complete by:  As directed    Discharge instructions    Complete by:  As directed    Please call infectious disease specialist after discharge to confirm appointment follow-up date and time.   Increase activity slowly    Complete by:  As directed      Current Discharge Medication List    START taking these medications   Details  fluconazole (DIFLUCAN) 200 MG tablet Take 2 tablets (400 mg total) by mouth daily. Qty: 20 tablet, Refills: 0      CONTINUE these medications which have CHANGED   Details  sulfamethoxazole-trimethoprim (BACTRIM,SEPTRA) 200-40 MG/5ML suspension Take 20 mLs by mouth daily. Qty: 100 mL, Refills: 0      CONTINUE these medications which have NOT CHANGED   Details  dolutegravir (TIVICAY) 50 MG tablet Take 1 tablet (50 mg total) by mouth daily. Qty: 30 tablet, Refills: 5    emtricitabine-tenofovir AF (DESCOVY) 200-25 MG tablet Take 1 tablet by mouth daily. Qty: 30 tablet, Refills: 5    nystatin (MYCOSTATIN) 100000 UNIT/ML suspension Take 5 mLs (500,000 Units total) by mouth 4 (four) times daily. Qty: 60 mL, Refills: 1  STOP taking these medications     omeprazole (PRILOSEC) 20 MG capsule      ondansetron (ZOFRAN ODT) 4 MG disintegrating tablet        No Known Allergies    The results of significant diagnostics from this hospitalization (including imaging, microbiology, ancillary and laboratory) are listed below for reference.    Significant Diagnostic Studies: Dg Chest 2  View  Result Date: 10/27/2016 CLINICAL DATA:  46 year old female chest pain. EXAM: CHEST  2 VIEW COMPARISON:  Chest radiograph dated 02/02/2016 FINDINGS: The heart size and mediastinal contours are within normal limits. Both lungs are clear. The visualized skeletal structures are unremarkable. IMPRESSION: No active cardiopulmonary disease. Electronically Signed   By: Anner Crete M.D.   On: 10/27/2016 21:09   Ct Angio Chest Pe W And/or Wo Contrast  Result Date: 10/28/2016 CLINICAL DATA:  Chest pain and syncope. Recent travel history. Immunocompromised. EXAM: CT ANGIOGRAPHY CHEST WITH CONTRAST TECHNIQUE: Multidetector CT imaging of the chest was performed using the standard protocol during bolus administration of intravenous contrast. Multiplanar CT image reconstructions and MIPs were obtained to evaluate the vascular anatomy. CONTRAST:  100 cc Isovue 370 IV COMPARISON:  CXR 10/27/2016 FINDINGS: Cardiovascular: Normal branch pattern of the great vessels off the aortic arch. No aortic aneurysm or dissection. The main pulmonary artery is slightly dilated up to 3 cm consistent with pulmonary hypertension. No pulmonary embolus. Heart is normal in size without pericardial effusion. Mediastinum/Nodes: Debris and fluid-filled distention of the esophagus to the proximal third. Mild diffuse thickening of the esophagus is also noted question esophagitis. No lymphadenopathy. No thyromegaly or nodules. Lungs/Pleura: Lungs are clear. No pleural effusion or pneumothorax. Upper Abdomen: No acute abnormality. Slightly thick-walled appearance of the stomach is nonspecific but likely due to underdistention. Musculoskeletal: No chest wall abnormality. No acute or significant osseous findings. Review of the MIP images confirms the above findings. IMPRESSION: 1. No acute pulmonary embolus. Mild dilatation of the main pulmonary artery to 3 cm which may reflect pulmonary hypertension. 2. No pneumonic consolidation or CHF. 3.  Debris and fluid-filled distention of the esophagus to the proximal third. Mild thickening of the esophagus is also noted. Query esophagitis. Electronically Signed   By: Ashley Royalty M.D.   On: 10/28/2016 01:51    Microbiology: Recent Results (from the past 240 hour(s))  Urine culture     Status: Abnormal   Collection Time: 10/27/16  9:34 PM  Result Value Ref Range Status   Specimen Description URINE, RANDOM  Final   Special Requests NONE  Final   Culture >=100,000 COLONIES/mL ESCHERICHIA COLI (A)  Final   Report Status 10/30/2016 FINAL  Final   Organism ID, Bacteria ESCHERICHIA COLI (A)  Final      Susceptibility   Escherichia coli - MIC*    AMPICILLIN >=32 RESISTANT Resistant     CEFAZOLIN >=64 RESISTANT Resistant     CEFTRIAXONE <=1 SENSITIVE Sensitive     CIPROFLOXACIN <=0.25 SENSITIVE Sensitive     GENTAMICIN >=16 RESISTANT Resistant     IMIPENEM <=0.25 SENSITIVE Sensitive     NITROFURANTOIN <=16 SENSITIVE Sensitive     TRIMETH/SULFA >=320 RESISTANT Resistant     AMPICILLIN/SULBACTAM >=32 RESISTANT Resistant     PIP/TAZO 64 INTERMEDIATE Intermediate     Extended ESBL NEGATIVE Sensitive     * >=100,000 COLONIES/mL ESCHERICHIA COLI  Culture, blood (Routine X 2) w Reflex to ID Panel     Status: None (Preliminary result)   Collection Time: 10/27/16  9:51 PM  Result Value Ref Range Status   Specimen Description BLOOD LEFT HAND  Final   Special Requests   Final    BOTTLES DRAWN AEROBIC AND ANAEROBIC Blood Culture adequate volume   Culture NO GROWTH 4 DAYS  Final   Report Status PENDING  Incomplete  Culture, blood (Routine X 2) w Reflex to ID Panel     Status: None (Preliminary result)   Collection Time: 10/27/16  9:51 PM  Result Value Ref Range Status   Specimen Description BLOOD RIGHT HAND  Final   Special Requests   Final    BOTTLES DRAWN AEROBIC AND ANAEROBIC Blood Culture adequate volume   Culture NO GROWTH 4 DAYS  Final   Report Status PENDING  Incomplete      Labs: Basic Metabolic Panel:  Recent Labs Lab 10/27/16 1950 10/28/16 0425 10/29/16 0341 10/30/16 0236  NA 141 146* 144 142  K 2.7* 2.8* 2.8* 3.7  CL 102 114* 114* 116*  CO2 26 22 24 22   GLUCOSE 84 82 108* 88  BUN 5* <5* <5* <5*  CREATININE 0.64 0.57 0.58 0.50  CALCIUM 9.3 8.3* 8.0* 7.7*  MG  --  1.5* 1.8  --   PHOS  --  2.1*  --   --    Liver Function Tests:  Recent Labs Lab 10/27/16 1950  AST 18  ALT 9*  ALKPHOS 60  BILITOT 0.8  PROT 9.5*  ALBUMIN 3.4*    Recent Labs Lab 10/27/16 1950  LIPASE 36   No results for input(s): AMMONIA in the last 168 hours. CBC:  Recent Labs Lab 10/27/16 1950 10/28/16 0425 10/29/16 0341  WBC 4.2 4.7 2.5*  NEUTROABS  --  3.0  --   HGB 10.9* 10.0* 8.5*  HCT 35.5* 31.4* 27.3*  MCV 70.9* 71.5* 71.8*  PLT 383 321 257   Cardiac Enzymes:  Recent Labs Lab 10/27/16 1950 10/28/16 0453  TROPONINI <0.03 <0.03   BNP: BNP (last 3 results) No results for input(s): BNP in the last 8760 hours.  ProBNP (last 3 results) No results for input(s): PROBNP in the last 8760 hours.  CBG: No results for input(s): GLUCAP in the last 168 hours.  Signed:  Velvet Bathe MD.  [facility] 10/31/2016, 2:21 PM

## 2016-10-31 NOTE — Progress Notes (Addendum)
Patient stating she is having chest pain. When assessing her she is on her phone scrolling facebook. States "it really hurts". Asked her to show me the location and seems to be localized around L chest, not radiating to any other area. Vitals signs checked and Dr. Wendee Beavers paged, will continue to monitor.  Checked back on patient who is now feeling better sitting up in bed eating.

## 2016-10-31 NOTE — Progress Notes (Signed)
Nsg Discharge Note  Admit Date:  10/27/2016 Discharge date: 10/31/2016   Ritu Gagliardo Pohl to be D/C'd Home per MD order.  AVS completed.  Copy for chart, and copy for patient signed, and dated. Patient/caregiver able to verbalize understanding.  Discharge Medication: Allergies as of 10/31/2016   No Known Allergies     Medication List    STOP taking these medications   omeprazole 20 MG capsule Commonly known as:  PRILOSEC   ondansetron 4 MG disintegrating tablet Commonly known as:  ZOFRAN ODT     TAKE these medications   dolutegravir 50 MG tablet Commonly known as:  TIVICAY Take 1 tablet (50 mg total) by mouth daily.   emtricitabine-tenofovir AF 200-25 MG tablet Commonly known as:  DESCOVY Take 1 tablet by mouth daily.   fluconazole 200 MG tablet Commonly known as:  DIFLUCAN Take 2 tablets (400 mg total) by mouth daily.   nystatin 100000 UNIT/ML suspension Commonly known as:  MYCOSTATIN Take 5 mLs (500,000 Units total) by mouth 4 (four) times daily.   sulfamethoxazole-trimethoprim 200-40 MG/5ML suspension Commonly known as:  BACTRIM,SEPTRA Take 20 mLs by mouth daily. What changed:  when to take this       Discharge Assessment: Vitals:   10/31/16 0512 10/31/16 0801  BP: 116/66 (!) 173/92  Pulse: 72 77  Resp: 18 20  Temp: 98.4 F (36.9 C) 98.3 F (36.8 C)   Skin clean, dry and intact without evidence of skin break down, no evidence of skin tears noted. IV catheter discontinued intact. Site without signs and symptoms of complications - no redness or edema noted at insertion site, patient denies c/o pain - only slight tenderness at site.  Dressing with slight pressure applied.  D/c Instructions-Education: Discharge instructions given to patient/family with verbalized understanding. D/c education completed with patient/family including follow up instructions, medication list, d/c activities limitations if indicated, with other d/c instructions as indicated by MD  - patient able to verbalize understanding, all questions fully answered. Patient instructed to return to ED, call 911, or call MD for any changes in condition.  Patient escorted via [place], and D/C home via private auto.  Guss Farruggia Margaretha Sheffield, RN 10/31/2016 2:41 PM

## 2016-11-01 LAB — CULTURE, BLOOD (ROUTINE X 2)
CULTURE: NO GROWTH
Culture: NO GROWTH
SPECIAL REQUESTS: ADEQUATE
SPECIAL REQUESTS: ADEQUATE

## 2016-11-13 ENCOUNTER — Ambulatory Visit: Payer: Self-pay | Admitting: Infectious Diseases

## 2016-11-13 ENCOUNTER — Ambulatory Visit: Payer: Self-pay

## 2016-11-13 ENCOUNTER — Ambulatory Visit (INDEPENDENT_AMBULATORY_CARE_PROVIDER_SITE_OTHER): Payer: Self-pay | Admitting: Infectious Diseases

## 2016-11-13 ENCOUNTER — Encounter: Payer: Self-pay | Admitting: Infectious Diseases

## 2016-11-13 VITALS — BP 130/88 | HR 97 | Temp 98.0°F | Wt 92.0 lb

## 2016-11-13 DIAGNOSIS — Z0000 Encounter for general adult medical examination without abnormal findings: Secondary | ICD-10-CM

## 2016-11-13 DIAGNOSIS — B3781 Candidal esophagitis: Secondary | ICD-10-CM

## 2016-11-13 DIAGNOSIS — B20 Human immunodeficiency virus [HIV] disease: Secondary | ICD-10-CM

## 2016-11-13 DIAGNOSIS — N390 Urinary tract infection, site not specified: Secondary | ICD-10-CM

## 2016-11-13 LAB — COMPLETE METABOLIC PANEL WITH GFR
ALT: 11 U/L (ref 6–29)
AST: 17 U/L (ref 10–30)
Albumin: 3.6 g/dL (ref 3.6–5.1)
Alkaline Phosphatase: 53 U/L (ref 33–115)
BUN: 10 mg/dL (ref 7–25)
CALCIUM: 8.8 mg/dL (ref 8.6–10.2)
CHLORIDE: 104 mmol/L (ref 98–110)
CO2: 20 mmol/L (ref 20–31)
Creat: 0.54 mg/dL (ref 0.50–1.10)
Glucose, Bld: 75 mg/dL (ref 65–99)
Potassium: 4 mmol/L (ref 3.5–5.3)
Sodium: 134 mmol/L — ABNORMAL LOW (ref 135–146)
Total Bilirubin: 0.5 mg/dL (ref 0.2–1.2)
Total Protein: 7.8 g/dL (ref 6.1–8.1)

## 2016-11-13 MED ORDER — AZITHROMYCIN 600 MG PO TABS: 1200.0000 mg | ORAL_TABLET | ORAL | 1 refills | Status: DC

## 2016-11-13 MED ORDER — BICTEGRAVIR-EMTRICITAB-TENOFOV 50-200-25 MG PO TABS: 1.0000 | ORAL_TABLET | Freq: Every day | ORAL | 5 refills | Status: DC

## 2016-11-13 NOTE — Assessment & Plan Note (Addendum)
Difficulty swallowing pills still but overall Improving on prolonged fluconazole therapy. Will continue to improve with ART. Continue fluconazole as previously prescribed.

## 2016-11-13 NOTE — Assessment & Plan Note (Addendum)
No recent CD4 / VL since October. Will obtain: HIV VL with genotype reflex, CD4 count today. Will have her return in 2 months with repeat labs before. Declined condoms today. I will have her continue her Bactrim SS daily and add Azithromycin 1200 mg once a weekfor OI proph. She has met with Juliann Pulse for ADAP/RW application but will have her meet with Caryl Pina to start emergency drug assistance through company. Will have her follow up with me in 6 weeks with labs 2w before. Counseled on transmission of HIV through sexual intercourse and need for condom use and partner disclosure.

## 2016-11-13 NOTE — Assessment & Plan Note (Signed)
Resolved

## 2016-11-13 NOTE — Progress Notes (Signed)
PCP - Neva Seat, MD    Patient's Medications  New Prescriptions   AZITHROMYCIN (ZITHROMAX) 600 MG TABLET    Take 2 tablets (1,200 mg total) by mouth once a week.   BICTEGRAVIR-EMTRICITABINE-TENOFOVIR AF (BIKTARVY) 50-200-25 MG TABS TABLET    Take 1 tablet by mouth daily. Try to take at the same time each day with or without food.  Previous Medications   NYSTATIN (MYCOSTATIN) 100000 UNIT/ML SUSPENSION    Take 5 mLs (500,000 Units total) by mouth 4 (four) times daily.   SULFAMETHOXAZOLE-TRIMETHOPRIM (BACTRIM,SEPTRA) 200-40 MG/5ML SUSPENSION    Take 20 mLs by mouth daily.  Modified Medications   No medications on file  Discontinued Medications   DOLUTEGRAVIR (TIVICAY) 50 MG TABLET    Take 1 tablet (50 mg total) by mouth daily.   EMTRICITABINE-TENOFOVIR AF (DESCOVY) 200-25 MG TABLET    Take 1 tablet by mouth daily.    Subjective: Shirley White presents to clinic today to re-establish care for her HIV infection. Previously followed by Dr. Linus Salmons (recently in the hospital with UTI and candidal esophagitis). Originally diagnosed with HIV in 2005. History of OIs: candidiasis.   HPI:  HIV =  Recently moved back to the area from [place] and has been off ART for some time now. Last prescribed TIVICAY + DESCOVY in February of this year as there was some concern over adherence and need for higher resistance barrier. Recently in the hospital for what was determined to be esophageal candidiasis (EGD done) and UTI. She is improved since starting on Fluconazole therapy. She is currently taking Bactrim DS 3x/week for PCP proph. Has not had HIV labs since October 2017 (VL - 43,000, CD4 - 30).   Health Maintenance = Shirley White reports no exercise and acceptable appetite. Reports she has picked up some weight lately and back to ~ 90 lbs. No sexual partners currently but always uses condoms when she has sex. Has not had a pp in some time. Currently still having periods.   Review of  Systems: Review of Systems  Constitutional: Negative for chills, fever, malaise/fatigue and weight loss.  HENT: Negative for sore throat.        No dental problems  Respiratory: Negative for cough and sputum production.   Cardiovascular: Negative for chest pain and leg swelling.  Gastrointestinal: Negative for abdominal pain, diarrhea and vomiting.  Genitourinary: Negative for dysuria and flank pain.  Musculoskeletal: Negative for joint pain, myalgias and neck pain.  Skin: Negative for rash.  Neurological: Negative for dizziness, tingling and headaches.  Psychiatric/Behavioral: Negative for depression and substance abuse. The patient is not nervous/anxious and does not have insomnia.     Past Medical History:  Diagnosis Date  . Anemia   . GERD (gastroesophageal reflux disease)   . HIV disease ([place])   . Hypertension     Social History  Substance Use Topics  . Smoking status: Never Smoker  . Smokeless tobacco: Never Used  . Alcohol use 0.6 oz/week    1 Cans of beer per week     Comment: occa    Family History  Problem Relation Age of Onset  . Hyperlipidemia Mother   . Hypertension Mother   . Cancer Father     No Known Allergies  Objective:  Vitals:   11/13/16 1407  BP: 130/88  Pulse: 97  Temp: 98 F (36.7 C)  TempSrc: Oral  Weight: 92 lb (41.7 kg)   Body mass index is 17.38  kg/m.  Physical Exam  Constitutional: She is oriented to person, place, and time and well-developed, well-nourished, and in no distress.  Thin appearing  HENT:  Mouth/Throat: No oral lesions. No dental abscesses.  Cardiovascular: Normal rate, regular rhythm and normal heart sounds.   Pulmonary/Chest: Effort normal and breath sounds normal.  Abdominal: Soft. She exhibits no distension. There is no tenderness.  Musculoskeletal: Normal range of motion. She exhibits no tenderness.  Lymphadenopathy:    She has no cervical adenopathy.  Neurological: She is alert and oriented to person,  place, and time.  Skin: Skin is warm and dry. No rash noted.  Psychiatric: Mood, affect and judgment normal.    Lab Results Lab Results  Component Value Date   WBC 2.5 (L) 10/29/2016   HGB 8.5 (L) 10/29/2016   HCT 27.3 (L) 10/29/2016   MCV 71.8 (L) 10/29/2016   PLT 257 10/29/2016    Lab Results  Component Value Date   CREATININE 0.54 11/13/2016   BUN 10 11/13/2016   NA 134 (L) 11/13/2016   K 4.0 11/13/2016   CL 104 11/13/2016   CO2 20 11/13/2016    Lab Results  Component Value Date   ALT 11 11/13/2016   AST 17 11/13/2016   ALKPHOS 53 11/13/2016   BILITOT 0.5 11/13/2016    Lab Results  Component Value Date   CHOL 143 08/21/2015   HDL 61 08/21/2015   LDLCALC 70 08/21/2015   TRIG 62 08/21/2015   CHOLHDL 2.3 08/21/2015   HIV 1 RNA Quant (copies/mL)  Date Value  12/07/2015 20,410 (H)  08/21/2015 18,268 (H)  11/14/2014 12,080 (H)   CD4 T Cell Abs (/uL)  Date Value  11/13/2016 50 (L)  02/03/2016 30 (L)  12/07/2015 30 (L)      Problem List Items Addressed This Visit      Digestive   Esophageal candidiasis ([place]) - Primary    Difficulty swallowing pills still but overall Improving on prolonged fluconazole therapy. Will continue to improve with ART. Continue fluconazole as previously prescribed.       Relevant Medications   azithromycin (ZITHROMAX) 600 MG tablet   bictegravir-emtricitabine-tenofovir AF (BIKTARVY) 50-200-25 MG TABS tablet     Genitourinary   RESOLVED: UTI (urinary tract infection)    Resolved       Relevant Medications   azithromycin (ZITHROMAX) 600 MG tablet   bictegravir-emtricitabine-tenofovir AF (BIKTARVY) 50-200-25 MG TABS tablet     Other   Routine health maintenance    In need of pap smear as it has been a while. Offered exam today, however she declined. I asked for her to set up appointment with me in the future or we will plan on exam in 2 m when she returns for follow up. Does not meet criteria for colonoscopy and no history to  begin early screening with mammography until 50. She is very thin and small framed. Will assess vitamin D levels next visit and discuss DEXA scan.       Relevant Orders   Vitamin D pnl(25-hydrxy+1,25-dihy)-bld   AIDS ([place])    No recent CD4 / VL since October. Will obtain: HIV VL with genotype reflex, CD4 count today. Will have her return in 2 months with repeat labs before. Declined condoms today. I will have her continue her Bactrim SS daily and add Azithromycin 1200 mg once a weekfor OI proph. She has met with Juliann Pulse for ADAP/RW application but will have her meet with Caryl Pina to start emergency drug assistance through company.  Will have her follow up with me in 6 weeks with labs 2w before. Counseled on transmission of HIV through sexual intercourse and need for condom use and partner disclosure.       Relevant Medications   azithromycin (ZITHROMAX) 600 MG tablet   bictegravir-emtricitabine-tenofovir AF (BIKTARVY) 50-200-25 MG TABS tablet   Other Relevant Orders   HIV RNA, RTPCR W/R GT (RTI, PI,INT) (Completed)   T-helper cell (CD4)- ([facility] clinic only) (Completed)   COMPLETE METABOLIC PANEL WITH GFR (Completed)   Vitamin D pnl(25-hydrxy+1,25-dihy)-bld   HIV 1 RNA quant-no reflex-bld   T-helper cell (CD4)- ([facility] clinic only)      Janene Madeira, MSN, NP-C [facility]  11/14/16 2:59 PM

## 2016-11-13 NOTE — Patient Instructions (Addendum)
Wonderful to meet you today  Do not take your Tivicay / Descovy  Continue taking your Bactrim and Fluconazole  We are going to start you on a new medication today called BIKTARVY  Please meet with Juliann Pulse to start your ADAP/[facility] application so we can get you back on your medications.   Please come back to see me in 2 months so we can recheck your labs.

## 2016-11-14 ENCOUNTER — Encounter: Payer: Self-pay | Admitting: Infectious Diseases

## 2016-11-14 LAB — T-HELPER CELL (CD4) - (RCID CLINIC ONLY)
CD4 % Helper T Cell: 4 % — ABNORMAL LOW (ref 33–55)
CD4 T Cell Abs: 50 /uL — ABNORMAL LOW (ref 400–2700)

## 2016-11-14 NOTE — Assessment & Plan Note (Signed)
In need of pap smear as it has been a while. Offered exam today, however she declined. I asked for her to set up appointment with me in the future or we will plan on exam in 2 m when she returns for follow up. Does not meet criteria for colonoscopy and no history to begin early screening with mammography until 51. She is very thin and small framed. Will assess vitamin D levels next visit and discuss DEXA scan.

## 2016-11-19 LAB — HIV RNA, RTPCR W/R GT (RTI, PI,INT)
HIV-1 RNA, QN PCR: 67400 copies/mL — ABNORMAL HIGH
HIV-1 RNA, QN PCR: 4.83 {Log_copies}/mL — AB

## 2016-11-21 LAB — RFLX HIV-1 INTEGRASE GENOTYPE

## 2016-11-24 LAB — HIV-1 GENOTYPE: HIV-1 GENOTYPE: DETECTED — AB

## 2016-11-24 NOTE — Progress Notes (Signed)
Genotype including INSTI reveals pansensitive virus. Continue Biktarvy and OI proph.

## 2016-11-28 ENCOUNTER — Other Ambulatory Visit: Payer: Self-pay | Admitting: Infectious Diseases

## 2016-11-28 DIAGNOSIS — B20 Human immunodeficiency virus [HIV] disease: Secondary | ICD-10-CM

## 2016-12-16 ENCOUNTER — Other Ambulatory Visit (INDEPENDENT_AMBULATORY_CARE_PROVIDER_SITE_OTHER): Payer: Self-pay

## 2016-12-16 DIAGNOSIS — B20 Human immunodeficiency virus [HIV] disease: Secondary | ICD-10-CM

## 2016-12-16 LAB — CBC WITH DIFFERENTIAL/PLATELET
Basophils Absolute: 80 cells/uL (ref 0–200)
Basophils Relative: 2 %
EOS ABS: 160 {cells}/uL (ref 15–500)
Eosinophils Relative: 4 %
HEMATOCRIT: 27.7 % — AB (ref 35.0–45.0)
HEMOGLOBIN: 8.4 g/dL — AB (ref 11.7–15.5)
LYMPHS PCT: 31 %
Lymphs Abs: 1240 cells/uL (ref 850–3900)
MCH: 21.9 pg — ABNORMAL LOW (ref 27.0–33.0)
MCHC: 30.3 g/dL — ABNORMAL LOW (ref 32.0–36.0)
MCV: 72.3 fL — AB (ref 80.0–100.0)
MONO ABS: 440 {cells}/uL (ref 200–950)
MPV: 9.6 fL (ref 7.5–12.5)
Monocytes Relative: 11 %
NEUTROS PCT: 52 %
Neutro Abs: 2080 cells/uL (ref 1500–7800)
Platelets: 397 10*3/uL (ref 140–400)
RBC: 3.83 MIL/uL (ref 3.80–5.10)
RDW: 19.8 % — AB (ref 11.0–15.0)
WBC: 4 10*3/uL (ref 3.8–10.8)

## 2016-12-17 LAB — COMPLETE METABOLIC PANEL WITH GFR
ALT: 10 U/L (ref 6–29)
AST: 14 U/L (ref 10–30)
Albumin: 3.9 g/dL (ref 3.6–5.1)
Alkaline Phosphatase: 60 U/L (ref 33–115)
BUN: 8 mg/dL (ref 7–25)
CHLORIDE: 106 mmol/L (ref 98–110)
CO2: 21 mmol/L (ref 20–32)
CREATININE: 0.55 mg/dL (ref 0.50–1.10)
Calcium: 8.8 mg/dL (ref 8.6–10.2)
GFR, Est African American: >89 mL/min (ref >=60)
GFR, Est Non African American: >89 mL/min (ref >=60)
Glucose, Bld: 77 mg/dL (ref 65–99)
Potassium: 4.1 mmol/L (ref 3.5–5.3)
Sodium: 137 mmol/L (ref 135–146)
Total Bilirubin: 0.6 mg/dL (ref 0.2–1.2)
Total Protein: 8 g/dL (ref 6.1–8.1)

## 2016-12-17 LAB — T-HELPER CELL (CD4) - (RCID CLINIC ONLY)
CD4 T CELL HELPER: 3 % — AB (ref 33–55)
CD4 T Cell Abs: 40 /uL — ABNORMAL LOW (ref 400–2700)

## 2016-12-18 LAB — HIV-1 RNA QUANT-NO REFLEX-BLD
HIV 1 RNA QUANT: 5880 {copies}/mL — AB
HIV-1 RNA QUANT, LOG: 3.77 {Log_copies}/mL — AB

## 2016-12-29 ENCOUNTER — Ambulatory Visit: Payer: Self-pay | Admitting: Infectious Diseases

## 2017-01-01 ENCOUNTER — Ambulatory Visit: Payer: Self-pay | Admitting: Infectious Diseases

## 2017-01-26 ENCOUNTER — Ambulatory Visit: Payer: Self-pay | Admitting: Infectious Diseases

## 2017-02-06 ENCOUNTER — Encounter: Payer: Self-pay | Admitting: Infectious Diseases

## 2017-02-06 ENCOUNTER — Other Ambulatory Visit: Payer: Self-pay | Admitting: Infectious Diseases

## 2017-02-06 DIAGNOSIS — B20 Human immunodeficiency virus [HIV] disease: Secondary | ICD-10-CM

## 2017-02-06 NOTE — Progress Notes (Signed)
Referral placed for Shirley White for community nursing support to help with HIV medication adherence / counseling. Has missed 2 appointments with me and her CD4 was pretty low. She also has significant anemia that needs follow up.

## 2017-02-10 ENCOUNTER — Telehealth: Payer: Self-pay | Admitting: *Deleted

## 2017-02-10 NOTE — Telephone Encounter (Signed)
RN received a referral to engage with the patient in hopes of increasing her medical adherence. Contacted Shirley White and had to leave a message stating my name and that I am a nurse calling from her Dr's office. Let the patient know that we have missed her and would love to check in with her to see if we can offer additional services. Left me number for the patient to please return my call

## 2017-02-10 NOTE — Telephone Encounter (Signed)
Received a return call back from Shirley White's mother stating Shirley White is doing well and is currently at work. Shirley White's mother stated she will give the message to [facility] and have her give me a call back

## 2017-03-08 ENCOUNTER — Emergency Department (HOSPITAL_COMMUNITY): Payer: Self-pay

## 2017-03-08 ENCOUNTER — Emergency Department (HOSPITAL_COMMUNITY)
Admission: EM | Admit: 2017-03-08 | Discharge: 2017-03-08 | Disposition: A | Discharge status: Home / Self Care | Payer: Self-pay | Attending: Emergency Medicine | Admitting: Emergency Medicine

## 2017-03-08 ENCOUNTER — Other Ambulatory Visit: Payer: Self-pay

## 2017-03-08 ENCOUNTER — Encounter (HOSPITAL_COMMUNITY): Payer: Self-pay | Admitting: Emergency Medicine

## 2017-03-08 DIAGNOSIS — R0789 Other chest pain: Secondary | ICD-10-CM | POA: Insufficient documentation

## 2017-03-08 DIAGNOSIS — B370 Candidal stomatitis: Secondary | ICD-10-CM | POA: Insufficient documentation

## 2017-03-08 DIAGNOSIS — Z9114 Patient's other noncompliance with medication regimen: Secondary | ICD-10-CM | POA: Insufficient documentation

## 2017-03-08 DIAGNOSIS — R05 Cough: Secondary | ICD-10-CM | POA: Insufficient documentation

## 2017-03-08 DIAGNOSIS — I10 Essential (primary) hypertension: Secondary | ICD-10-CM | POA: Insufficient documentation

## 2017-03-08 DIAGNOSIS — R059 Cough, unspecified: Secondary | ICD-10-CM

## 2017-03-08 DIAGNOSIS — B20 Human immunodeficiency virus [HIV] disease: Secondary | ICD-10-CM | POA: Insufficient documentation

## 2017-03-08 DIAGNOSIS — R110 Nausea: Secondary | ICD-10-CM | POA: Insufficient documentation

## 2017-03-08 DIAGNOSIS — Z79899 Other long term (current) drug therapy: Secondary | ICD-10-CM | POA: Insufficient documentation

## 2017-03-08 LAB — CBC
HCT: 27.5 % — ABNORMAL LOW (ref 36.0–46.0)
HEMOGLOBIN: 8.1 g/dL — AB (ref 12.0–15.0)
MCH: 18 pg — ABNORMAL LOW (ref 26.0–34.0)
MCHC: 29.5 g/dL — AB (ref 30.0–36.0)
MCV: 61 fL — ABNORMAL LOW (ref 78.0–100.0)
Platelets: 426 10*3/uL — ABNORMAL HIGH (ref 150–400)
RBC: 4.51 MIL/uL (ref 3.87–5.11)
RDW: 20.4 % — AB (ref 11.5–15.5)
WBC: 3.5 10*3/uL — AB (ref 4.0–10.5)

## 2017-03-08 LAB — I-STAT TROPONIN, ED: TROPONIN I, POC: 0 ng/mL (ref 0.00–0.08)

## 2017-03-08 LAB — BASIC METABOLIC PANEL
ANION GAP: 8 (ref 5–15)
BUN: <5 mg/dL — ABNORMAL LOW (ref 6–20)
CALCIUM: 8.8 mg/dL — AB (ref 8.9–10.3)
CO2: 26 mmol/L (ref 22–32)
Chloride: 104 mmol/L (ref 101–111)
Creatinine, Ser: 0.51 mg/dL (ref 0.44–1.00)
GLUCOSE: 98 mg/dL (ref 65–99)
Potassium: 3 mmol/L — ABNORMAL LOW (ref 3.5–5.1)
SODIUM: 138 mmol/L (ref 135–145)

## 2017-03-08 LAB — RAPID STREP SCREEN (MED CTR MEBANE ONLY): Streptococcus, Group A Screen (Direct): NEGATIVE

## 2017-03-08 MED ORDER — AZITHROMYCIN 600 MG PO TABS: 1200.0000 mg | ORAL_TABLET | ORAL | 1 refills | Status: DC

## 2017-03-08 MED ORDER — FLUCONAZOLE 200 MG PO TABS: 200.0000 mg | ORAL_TABLET | Freq: Every day | ORAL | 0 refills | Status: AC

## 2017-03-08 NOTE — Discharge Instructions (Signed)
Your lab work, chest x-ray, EKG is normal.   You need to start taking your prophylaxis azithromycin and fluconazole to prevent pneumonia and oral yeast infection.  Your prescriptions have been re-ordered.   You need to follow up with your infectious disease provider as soon as possible, call and try to schedule your appointment sooner so they can re-evaluate you within 1 week to ensure symptoms are improving.   Return to the ED if you develop exertional chest pain, shortness of breath, fevers, chills, worsening cough  Take any over the counter cough medicine. Stay well hydrated.

## 2017-03-08 NOTE — ED Triage Notes (Addendum)
Cough  And chest congestion x 1 week states has had this before and  Her throat hurts to swallow food hx of thrush

## 2017-03-08 NOTE — ED Notes (Addendum)
Pt was 100% when walking with pulse ox

## 2017-03-08 NOTE — ED Provider Notes (Signed)
[facility] EMERGENCY DEPARTMENT Provider Note   CSN: [id number] Arrival date & time: 03/08/17  0907     History   Chief Complaint Chief Complaint  Patient presents with  . Cough    HPI Shirley White is a 46 y.o. female with history of AIDS (CD4 count 40 September 2018), medical noncompliance, anemia presents to the ED for evaluation of constant, nonradiating chest pain associated with dry cough and shortness of breath x 5 days. Chest pain is described as "soreness", central, nonradiating, nonpleuritic and nonexertional. Also reports sore throat and pain with swallowing and white plaques to the back of his tongue/throat, she has history of oropharyngeal candidiasis and states that symptoms today are similar to that. States two days ago she had nausea and vomiting for 24 hours, this has resolved.   Was last seen by infectious disease September 2018 when she restarted her HIV/AIDS medications, she states she has been compliant with these. However, she was unable to afford Cipro mycin and fluconazole for prophylaxis for the last 2-3 months. No flu shot this season. No known sick contacts. Denies fevers, abdominal pain, diarrhea, constipation, dysuria.  HPI  Past Medical History:  Diagnosis Date  . Anemia   . GERD (gastroesophageal reflux disease)   . HIV disease ([place])   . Hypertension     Patient Active Problem List   Diagnosis Date Noted  . Hypokalemia 10/28/2016  . Routine health maintenance 08/28/2016  . Immunocompromised status associated with infection ([place]) 02/03/2016  . Esophageal candidiasis ([place]) 02/02/2016  . Chest pain 02/02/2016  . AIDS ([place]) 02/02/2016  . Onychomycosis 12/13/2015  . Iron deficiency anemia due to chronic blood loss   . H/O menorrhagia 09/05/2014  . Human immunodeficiency virus I infection ([place]) 06/04/2006  . Sickle-cell trait ([place]) 06/04/2006    Past Surgical History:  Procedure Laterality Date  . CESAREAN SECTION    .  ESOPHAGOGASTRODUODENOSCOPY N/A 02/03/2016   Procedure: ESOPHAGOGASTRODUODENOSCOPY (EGD);  Surgeon: Manus Gunning, MD;  Location: [facility];  Service: Gastroenterology;  Laterality: N/A;    OB History    Gravida Para Term Preterm AB Living   4 4 4          SAB TAB Ectopic Multiple Live Births                   Home Medications    Prior to Admission medications   Medication Sig Start Date End Date Taking? Authorizing Provider  azithromycin (ZITHROMAX) 600 MG tablet Take 2 tablets (1,200 mg total) by mouth once a week. 03/08/17   Kinnie Feil, PA-C  bictegravir-emtricitabine-tenofovir AF (BIKTARVY) 50-200-25 MG TABS tablet Take 1 tablet by mouth daily. Try to take at the same time each day with or without food. 11/13/16   Dixon, Melton Krebs, NP  BIKTARVY 50-200-25 MG TABS tablet TAKE 1 TABLET BY MOUTH DAILY, TO TAKE AT THE SAME TIME EACH DAY WITH OR WITHOUT FOOD 11/28/16   Blackduck Callas, NP  fluconazole (DIFLUCAN) 200 MG tablet Take 1 tablet (200 mg total) by mouth daily for 7 days. 03/08/17 03/15/17  Kinnie Feil, PA-C  nystatin (MYCOSTATIN) 100000 UNIT/ML suspension Take 5 mLs (500,000 Units total) by mouth 4 (four) times daily. 10/21/16   Antonietta Breach, PA-C  sulfamethoxazole-trimethoprim (BACTRIM,SEPTRA) 200-40 MG/5ML suspension Take 20 mLs by mouth daily. 11/01/16   Velvet Bathe, MD    Family History Family History  Problem Relation Age of Onset  . Hyperlipidemia Mother   .  Hypertension Mother   . Cancer Father     Social History Social History   Tobacco Use  . Smoking status: Never Smoker  . Smokeless tobacco: Never Used  Substance Use Topics  . Alcohol use: Yes    Alcohol/week: 0.6 oz    Types: 1 Cans of beer per week    Comment: occa  . Drug use: No     Allergies   Patient has no known allergies.   Review of Systems Review of Systems  HENT: Positive for sore throat.   Respiratory: Positive for cough and shortness of breath.     Cardiovascular: Positive for chest pain.  Gastrointestinal: Positive for nausea (resolved) and vomiting (resolved).  All other systems reviewed and are negative.    Physical Exam Updated Vital Signs BP (!) 120/59 (BP Location: Right Arm)   Pulse 82   Temp 98.2 F (36.8 C) (Oral)   Resp 16   SpO2 100%   Physical Exam  Constitutional: She is oriented to person, place, and time. She appears well-developed and well-nourished. No distress.  Thin female. NAD.  HENT:  Head: Normocephalic and atraumatic.  Right Ear: External ear normal.  Left Ear: External ear normal.  Nose: Nose normal.  White plaques to posterior oropharynx, palate and posterior tongue Tonsils w/o edema. Uvula midline.  Moist mucous membranes  Eyes: Conjunctivae and EOM are normal. No scleral icterus.  Neck: Normal range of motion. Neck supple.  Cardiovascular: Normal rate, regular rhythm and normal heart sounds.  No murmur heard. Pulmonary/Chest: Effort normal and breath sounds normal. She has no wheezes.  Lungs CTAB No tachypnea or tachycardia Normal WOB  Abdominal: Soft. Bowel sounds are normal. There is no tenderness.  Musculoskeletal: Normal range of motion. She exhibits no deformity.  Neurological: She is alert and oriented to person, place, and time.  Skin: Skin is warm and dry. Capillary refill takes less than 2 seconds.  Psychiatric: She has a normal mood and affect. Her behavior is normal. Judgment and thought content normal.  Nursing note and vitals reviewed.    ED Treatments / Results  Labs (all labs ordered are listed, but only abnormal results are displayed) Labs Reviewed  BASIC METABOLIC PANEL - Abnormal; Notable for the following components:      Result Value   Potassium 3.0 (*)    BUN <5 (*)    Calcium 8.8 (*)    All other components within normal limits  CBC - Abnormal; Notable for the following components:   WBC 3.5 (*)    Hemoglobin 8.1 (*)    HCT 27.5 (*)    MCV 61.0 (*)     MCH 18.0 (*)    MCHC 29.5 (*)    RDW 20.4 (*)    Platelets 426 (*)    All other components within normal limits  RAPID STREP SCREEN (NOT AT [facility])  CULTURE, GROUP A STREP ([place])  I-STAT TROPONIN, ED    EKG  EKG Interpretation None       Radiology Dg Chest 2 View  Result Date: 03/08/2017 CLINICAL DATA:  46 year old female with cough and congestion for 1 week. EXAM: CHEST  2 VIEW COMPARISON:  10/27/2016 FINDINGS: The heart size and mediastinal contours are within normal limits. Both lungs are clear. The visualized skeletal structures are unremarkable. IMPRESSION: No active cardiopulmonary disease. Electronically Signed   By: Kristopher Oppenheim M.D.   On: 03/08/2017 09:55    Procedures Procedures (including critical care time)  Medications Ordered in ED Medications -  No data to display   Initial Impression / Assessment and Plan / ED Course  I have reviewed the triage vital signs and the nursing notes.  Pertinent labs & imaging results that were available during my care of the patient were reviewed by me and considered in my medical decision making (see chart for details).  Clinical Course as of Mar 09 1351  Nancy Fetter Mar 08, 2017  1158 Hemoglobin: (!) 8.1 [CG]  1159 Potassium: (!) 3.0 [CG]    Clinical Course User Index [CG] Kinnie Feil, PA-C   46 yo female with h/o AIDS (CD4 29 as of Sept 2018), medical non compliance, PCP pneumonia, oral candidiasis presents to ED for evaluation of non radiating, non pleuritic and non exertional CP, SOB, chills and sore throat. Exam is reassuring. No fever, hypoxia or tachypnea. Lungs CTAB. She has oropharyngeal thrush. No signs of deep neck tissue infection.   Lab work, CXR, EKG, trop x 1 unremarkable. She ambulated with normal O2.   CP and SOB sounds atypical. No evidence of PNA today. No leukocytosis. Suspect viral cough.   Final Clinical Impressions(s) / ED Diagnoses   Discussed importance of adherence to prophylaxis azithro and  fluconazole. Pt stated she has been unable to afford medications. Offered CM/SW consult however pt states [employer] program is active now and she will go to pharmacy to get rx. Encouraged her to f/u with ID provider in 1 week for re-evaluation. Sooner or return to ED for worsening s/s. Discussed return precautions. Patient, ED treatment and discharge plan was discussed with supervising physician who is agreeable with plan.   Final diagnoses:  Cough  Oral candidiasis  AIDS (acquired immune deficiency syndrome) [facility])    ED Discharge Orders        Ordered    azithromycin (ZITHROMAX) 600 MG tablet  Weekly     03/08/17 1215    fluconazole (DIFLUCAN) 200 MG tablet  Daily     03/08/17 1215       Arlean Hopping 03/08/17 1352    Virgel Manifold, MD 03/08/17 1530

## 2017-03-08 NOTE — ED Notes (Signed)
Declined W/C at D/C and was escorted to lobby by RN. 

## 2017-03-10 LAB — CULTURE, GROUP A STREP (THRC)

## 2017-04-02 ENCOUNTER — Ambulatory Visit: Payer: Self-pay | Admitting: *Deleted

## 2017-04-02 ENCOUNTER — Telehealth: Payer: Self-pay | Admitting: *Deleted

## 2017-04-02 DIAGNOSIS — B20 Human immunodeficiency virus [HIV] disease: Secondary | ICD-10-CM

## 2017-04-02 NOTE — Telephone Encounter (Signed)
RN contacted the number listed and spoke with Neytiri's mother who stating she was not home. I asked if she has another number I can reach her at and Ms Favaro said [facility] does not have another number.  RN informed Ms. Sieler that I am a nurse only trying to reach [place] to see if we can offer her some additional assistance with getting and staying on her medications. Ms Bene then stated that Ayde has a number where she is living [phone]. Contacted the number for Sheily and left a message stating my name and that we are just calling to offer some additional assistance with medication adherence.  RN received a callback from [place] stating "I am sick again" Lielle stated she has a prescription at her home and cannot get to and from the pharmacy to pick the medications up. Offered assistance today with medication management. Planned a visit for today to assist her with getting the medications. Chelise stated she has to be at work by [employer] today.

## 2017-04-13 NOTE — Progress Notes (Signed)
Prior to visit, RN contacted the number listed and spoke with Shirley White's mother who stating she was not home. I asked if she has another number I can reach her at and Shirley White said Shirley White does not have another number.  RN informed Shirley White that I am a nurse only trying to reach Shirley White to see if we can offer her some additional assistance with getting and staying on her medications. Shirley White then stated that Shirley White has a number where she is living [phone]. Contacted the number for Shirley White and left a message stating my name and that we are just calling to offer some additional assistance with medication adherence.  RN received a callback from Shirley White stating "I am sick again" Shirley White stated she has a prescription at her home and cannot get to and from the pharmacy to pick the medications up. Offered assistance today with medication management. Planned a visit for today to assist her with getting the medications. Shirley White stated she has to be at work by [employer] today.  Visit made at this time and Shirley White stated she has to go to work today but she does not have a way to get her antibiotic filled and wants to have it on hand before stating to take her [place] again. Rn agreed to have the medications filled and back to her home prior to her leaving for work. Consent was signed at this time. RN traveled to [employer] and dropped script off at this time. Waiting on medications and then transported it back to [place]. Shirley White stated she wish she had liquid forms of her medications. I do feel that mentally Shirley White is struggling with accepting the fact that she needs the medications for her HIV. Shirley White stated she will begin to take her medications

## 2017-04-13 NOTE — Patient Instructions (Signed)
RN met with client and or caregiver for assessment. RN reviewed Transport planner, [place], Home Safety Management Information Booklet. Home Fire Safety Assessment, Fall Risk Assessment and Suicide Risk Assessment was performed. RN also discussed information on a Living Will, Advanced Directives, and [place]. RN and Client/Designated Party educated/reviewed/signed Client Agreement and Consent for Service form along with Patient Rights and Responsibilities statement. RN developed patient specific and centered care plan. RN provided contact information and reviewed how to receive emergency help after hours for schedule changes, billing questions, reporting of safety issues, falls, concerns or any needs/questions. Standard Precaution and Infection control along with interventions to correct or prevent high risk behaviors instructed to the patient. Client/Caregiver reports understanding and agreement with the above

## 2017-04-18 NOTE — Telephone Encounter (Addendum)
Initial referral received on 02/10/17 by Dr Linus Salmons with 1st contact made on 02/10/17. Several documented attempts made to engage with the patient with success on 04/02/17.   Patient was evaluated 04/02/17 for [facility]NS. Patient was consented to care at this time.    Initial Assessment Points to Consider for Care  Points are not all inclusive to services and educated provided but supports the  patient's Individualized Plan of Care.  . Is Home safe for visits? Yes, large bully breed dog in the home but wasn't aggressive during visit.  Are all firearms or weapons secure? Yes . Insurance Coverage: RW . 1st HIV Diagnosis: 05/21/2006 . Mode of HIV Transmission: thought to be Heterosexual contact . Functional Status: none noted . Current Housing/Needs: living with cousin . Social Support/System:family is involved and close by . Culture/Religion/Spirituality: will not affect anticipated Plan of Care . Educational Background: high school . Legal Issues: none noted . Access/Utilization of [employer]: uses public transportation . Mental Health Concerns/Diagnosis: N/A  . Alcohol and/or Drug Use: N/A . Risk and Knowledge of HIV and Reduction in Transmission:  To be assessed and education to be provided as needed  . Nutritional Needs: none noted  Frequency / Duration of CBHCN visits: Effective 04/02/17: 65mo1, 79mo3   4 PRN's for complications with disease process/progression, medication changes or concerns   [facility]N will assess for learning needs related to diagnosis and treatment regimen, provide education as needed, fill pill box if needed, address any barriers which may be preventing medication compliance, and communicating with care team including physician and case manager.   Individualized [place] Period from 04/02/17 to 07/01/17  a. Type of service(s) and care to be delivered: RN Case Management  b. Frequency and duration of service: Effective 04/02/17: 53mo1, 33mo3 4 prns   for complications with disease process/progression, medication changes or  concerns . Visits/Contact may be conducted telephonically or in person to  best suit the patient. Patient works at [employer] during the day.  c. Activity restrictions: Pt may be up as tolerated and can safely ambulate  without the need for a assistive device   d. Safety Measures: Standard Precautions/Infection Control   e. Service Objectives and Goals: Service Objectives are to assist the pt with  HIV medication regimen adherence and staying in care with the Infectious  Disease Clinic by identifying barriers to care. RN will address the barriers  that  are identified by the patient. Patient current states she is sick and would like to get her medications.   f. Equipment required: No additional equipment needs at this time   g. Functional Limitations: None Noted   h. Rehabilitation potential: Guarded   i. Diet and Nutritional Needs: Regular Diet   j. Medications and treatments: Medications have been reconciled and  reviewed and are a part of EPIC electronic file   k. Specific therapies if needed: RN   l. Pertinent diagnoses: HIV disease,  Hx of medication NonCompliance   m. Expected outcome: Guarded

## 2017-04-21 NOTE — Telephone Encounter (Signed)
I approve the POC as outlined 

## 2017-04-23 ENCOUNTER — Telehealth: Payer: Self-pay | Admitting: *Deleted

## 2017-04-23 NOTE — Telephone Encounter (Signed)
Contacted Shirley White and left a message making her aware that I just want to be sure her needs are met and she is feeling better. Asked for a return call.

## 2017-05-21 ENCOUNTER — Ambulatory Visit: Payer: Self-pay

## 2017-06-04 ENCOUNTER — Encounter: Payer: Self-pay | Admitting: Internal Medicine

## 2017-07-01 ENCOUNTER — Telehealth: Payer: Self-pay | Admitting: *Deleted

## 2017-07-09 NOTE — Telephone Encounter (Signed)
PATIENT ON HOLD  Plan of Care orders have expired effective 07/01/17  but GOALS have not been completely meet at this time. I would like to connect with the patient and received further orders from MD. If I am unable to get in contact with her within the next 30 days I will have to discharge at that time. RN WILL NOT RESUME CARE UNTIL NEW MD ORDERS OBTAINED AND PATIENT ABLE/WILLING TO RE-ENGAGE IN CARE

## 2017-07-13 ENCOUNTER — Observation Stay (HOSPITAL_COMMUNITY)
Admission: EM | Admit: 2017-07-13 | Discharge: 2017-07-15 | Disposition: A | Discharge status: Home / Self Care | Payer: Self-pay | Attending: Internal Medicine | Admitting: Internal Medicine

## 2017-07-13 ENCOUNTER — Emergency Department (HOSPITAL_COMMUNITY): Payer: Self-pay

## 2017-07-13 ENCOUNTER — Encounter (HOSPITAL_COMMUNITY): Payer: Self-pay | Admitting: Emergency Medicine

## 2017-07-13 DIAGNOSIS — N920 Excessive and frequent menstruation with regular cycle: Secondary | ICD-10-CM | POA: Insufficient documentation

## 2017-07-13 DIAGNOSIS — R079 Chest pain, unspecified: Secondary | ICD-10-CM | POA: Insufficient documentation

## 2017-07-13 DIAGNOSIS — Z23 Encounter for immunization: Secondary | ICD-10-CM | POA: Insufficient documentation

## 2017-07-13 DIAGNOSIS — D259 Leiomyoma of uterus, unspecified: Secondary | ICD-10-CM | POA: Insufficient documentation

## 2017-07-13 DIAGNOSIS — R112 Nausea with vomiting, unspecified: Secondary | ICD-10-CM | POA: Diagnosis present

## 2017-07-13 DIAGNOSIS — D573 Sickle-cell trait: Secondary | ICD-10-CM | POA: Insufficient documentation

## 2017-07-13 DIAGNOSIS — B370 Candidal stomatitis: Secondary | ICD-10-CM | POA: Insufficient documentation

## 2017-07-13 DIAGNOSIS — R197 Diarrhea, unspecified: Secondary | ICD-10-CM

## 2017-07-13 DIAGNOSIS — D500 Iron deficiency anemia secondary to blood loss (chronic): Principal | ICD-10-CM | POA: Insufficient documentation

## 2017-07-13 DIAGNOSIS — D509 Iron deficiency anemia, unspecified: Secondary | ICD-10-CM | POA: Diagnosis present

## 2017-07-13 DIAGNOSIS — D649 Anemia, unspecified: Secondary | ICD-10-CM

## 2017-07-13 DIAGNOSIS — Z8249 Family history of ischemic heart disease and other diseases of the circulatory system: Secondary | ICD-10-CM | POA: Insufficient documentation

## 2017-07-13 DIAGNOSIS — B20 Human immunodeficiency virus [HIV] disease: Secondary | ICD-10-CM | POA: Insufficient documentation

## 2017-07-13 LAB — CBC
HCT: 23.3 % — ABNORMAL LOW (ref 36.0–46.0)
HEMOGLOBIN: 6.4 g/dL — AB (ref 12.0–15.0)
MCH: 16.4 pg — ABNORMAL LOW (ref 26.0–34.0)
MCHC: 27.5 g/dL — AB (ref 30.0–36.0)
MCV: 59.7 fL — ABNORMAL LOW (ref 78.0–100.0)
Platelets: 432 10*3/uL — ABNORMAL HIGH (ref 150–400)
RBC: 3.9 MIL/uL (ref 3.87–5.11)
RDW: 19.1 % — AB (ref 11.5–15.5)
WBC: 6.1 10*3/uL (ref 4.0–10.5)

## 2017-07-13 LAB — BASIC METABOLIC PANEL
ANION GAP: 7 (ref 5–15)
BUN: 8 mg/dL (ref 6–20)
CALCIUM: 8.6 mg/dL — AB (ref 8.9–10.3)
CO2: 25 mmol/L (ref 22–32)
Chloride: 106 mmol/L (ref 101–111)
Creatinine, Ser: 0.56 mg/dL (ref 0.44–1.00)
GFR calc Af Amer: >60 mL/min (ref >60)
GLUCOSE: 100 mg/dL — AB (ref 65–99)
Potassium: 3.5 mmol/L (ref 3.5–5.1)
Sodium: 138 mmol/L (ref 135–145)

## 2017-07-13 LAB — I-STAT BETA HCG BLOOD, ED (MC, WL, AP ONLY): I-stat hCG, quantitative: <5 m[IU]/mL (ref <5)

## 2017-07-13 LAB — I-STAT TROPONIN, ED: TROPONIN I, POC: 0 ng/mL (ref 0.00–0.08)

## 2017-07-13 NOTE — ED Triage Notes (Signed)
Pt c/o shortness of breath, chest pain that radiates to her throat that started yesterday. Also c/o nausea/vomiting/diarrhea. NAD noted.

## 2017-07-14 ENCOUNTER — Observation Stay (HOSPITAL_COMMUNITY): Payer: Self-pay

## 2017-07-14 ENCOUNTER — Encounter (HOSPITAL_COMMUNITY): Payer: Self-pay

## 2017-07-14 ENCOUNTER — Other Ambulatory Visit: Payer: Self-pay

## 2017-07-14 DIAGNOSIS — R197 Diarrhea, unspecified: Secondary | ICD-10-CM

## 2017-07-14 DIAGNOSIS — B370 Candidal stomatitis: Secondary | ICD-10-CM

## 2017-07-14 DIAGNOSIS — R112 Nausea with vomiting, unspecified: Secondary | ICD-10-CM | POA: Diagnosis present

## 2017-07-14 LAB — BASIC METABOLIC PANEL
Anion gap: 8 (ref 5–15)
BUN: 5 mg/dL — ABNORMAL LOW (ref 6–20)
CALCIUM: 8.3 mg/dL — AB (ref 8.9–10.3)
CO2: 24 mmol/L (ref 22–32)
CREATININE: 0.44 mg/dL (ref 0.44–1.00)
Chloride: 108 mmol/L (ref 101–111)
GFR calc non Af Amer: >60 mL/min (ref >60)
Glucose, Bld: 81 mg/dL (ref 65–99)
Potassium: 3.3 mmol/L — ABNORMAL LOW (ref 3.5–5.1)
SODIUM: 140 mmol/L (ref 135–145)

## 2017-07-14 LAB — PROTIME-INR
INR: 1.09
PROTHROMBIN TIME: 14.1 s (ref 11.4–15.2)

## 2017-07-14 LAB — FOLATE: FOLATE: 14.4 ng/mL (ref >5.9)

## 2017-07-14 LAB — CBC
HCT: 27.4 % — ABNORMAL LOW (ref 36.0–46.0)
HCT: 31.1 % — ABNORMAL LOW (ref 36.0–46.0)
HEMOGLOBIN: 9.4 g/dL — AB (ref 12.0–15.0)
Hemoglobin: 8.1 g/dL — ABNORMAL LOW (ref 12.0–15.0)
MCH: 18.7 pg — AB (ref 26.0–34.0)
MCH: 19.5 pg — AB (ref 26.0–34.0)
MCHC: 29.6 g/dL — ABNORMAL LOW (ref 30.0–36.0)
MCHC: 30.2 g/dL (ref 30.0–36.0)
MCV: 63.3 fL — ABNORMAL LOW (ref 78.0–100.0)
MCV: 64.7 fL — ABNORMAL LOW (ref 78.0–100.0)
PLATELETS: 412 10*3/uL — AB (ref 150–400)
Platelets: 342 10*3/uL (ref 150–400)
RBC: 4.33 MIL/uL (ref 3.87–5.11)
RBC: 4.81 MIL/uL (ref 3.87–5.11)
RDW: 23.3 % — ABNORMAL HIGH (ref 11.5–15.5)
RDW: 23.5 % — ABNORMAL HIGH (ref 11.5–15.5)
WBC: 6 10*3/uL (ref 4.0–10.5)
WBC: 6.4 10*3/uL (ref 4.0–10.5)

## 2017-07-14 LAB — TROPONIN I: Troponin I: <0.03 ng/mL (ref <0.03)

## 2017-07-14 LAB — FERRITIN: Ferritin: 4 ng/mL — ABNORMAL LOW (ref 11–307)

## 2017-07-14 LAB — LIPASE, BLOOD: LIPASE: 37 U/L (ref 11–51)

## 2017-07-14 LAB — RETICULOCYTES
RBC.: 3.97 MIL/uL (ref 3.87–5.11)
Retic Count, Absolute: 35.7 10*3/uL (ref 19.0–186.0)
Retic Ct Pct: 0.9 % (ref 0.4–3.1)

## 2017-07-14 LAB — APTT: aPTT: 29 seconds (ref 24–36)

## 2017-07-14 LAB — IRON AND TIBC
Iron: 21 ug/dL — ABNORMAL LOW (ref 28–170)
SATURATION RATIOS: 5 % — AB (ref 10.4–31.8)
TIBC: 420 ug/dL (ref 250–450)
UIBC: 399 ug/dL

## 2017-07-14 LAB — CBG MONITORING, ED: GLUCOSE-CAPILLARY: 84 mg/dL (ref 65–99)

## 2017-07-14 LAB — T-HELPER CELLS (CD4) COUNT (NOT AT ARMC)
CD4 T CELL ABS: 50 /uL — AB (ref 400–2700)
CD4 T CELL HELPER: 2 % — AB (ref 33–55)

## 2017-07-14 LAB — POC OCCULT BLOOD, ED: FECAL OCCULT BLD: NEGATIVE

## 2017-07-14 LAB — PREPARE RBC (CROSSMATCH)

## 2017-07-14 LAB — VITAMIN B12: Vitamin B-12: 213 pg/mL (ref 180–914)

## 2017-07-14 MED ORDER — OXYCODONE-ACETAMINOPHEN 5-325 MG PO TABS: 1.0000 | ORAL_TABLET | ORAL | Status: DC | PRN

## 2017-07-14 MED ORDER — SENNOSIDES-DOCUSATE SODIUM 8.6-50 MG PO TABS: 1.0000 | ORAL_TABLET | Freq: Every evening | ORAL | Status: DC | PRN

## 2017-07-14 MED ORDER — ONDANSETRON HCL 4 MG/2ML IJ SOLN
4.0000 mg | Freq: Four times a day (QID) | INTRAMUSCULAR | Status: DC | PRN
Start: 2017-07-14 — End: 2017-07-15
  Administered 2017-07-14: 4 mg via INTRAVENOUS
  Filled 2017-07-14 (×2): qty 2

## 2017-07-14 MED ORDER — ACETAMINOPHEN 650 MG RE SUPP: 650.0000 mg | Freq: Four times a day (QID) | RECTAL | Status: DC | PRN

## 2017-07-14 MED ORDER — SODIUM CHLORIDE 0.9 % IV SOLN: Freq: Once | INTRAVENOUS | Status: DC

## 2017-07-14 MED ORDER — FERUMOXYTOL INJECTION 510 MG/17 ML
510.0000 mg | Freq: Once | INTRAVENOUS | Status: AC
  Administered 2017-07-14: 510 mg via INTRAVENOUS
  Filled 2017-07-14: qty 17

## 2017-07-14 MED ORDER — ACETAMINOPHEN 325 MG PO TABS
650.0000 mg | ORAL_TABLET | Freq: Four times a day (QID) | ORAL | Status: DC | PRN
  Administered 2017-07-15: 325 mg via ORAL
  Filled 2017-07-14: qty 2

## 2017-07-14 MED ORDER — ZOLPIDEM TARTRATE 5 MG PO TABS: 5.0000 mg | ORAL_TABLET | Freq: Every evening | ORAL | Status: DC | PRN

## 2017-07-14 MED ORDER — PNEUMOCOCCAL VAC POLYVALENT 25 MCG/0.5ML IJ INJ
0.5000 mL | INJECTION | INTRAMUSCULAR | Status: AC
  Administered 2017-07-15: 0.5 mL via INTRAMUSCULAR
  Filled 2017-07-14: qty 0.5

## 2017-07-14 MED ORDER — HYDRALAZINE HCL 20 MG/ML IJ SOLN: 5.0000 mg | INTRAMUSCULAR | Status: DC | PRN

## 2017-07-14 MED ORDER — FERROUS SULFATE 325 (65 FE) MG PO TABS
325.0000 mg | ORAL_TABLET | Freq: Two times a day (BID) | ORAL | Status: DC
  Administered 2017-07-14: 325 mg via ORAL
  Filled 2017-07-14 (×2): qty 1

## 2017-07-14 MED ORDER — SODIUM CHLORIDE 0.9 % IV BOLUS
500.0000 mL | Freq: Once | INTRAVENOUS | Status: AC
  Administered 2017-07-14: 500 mL via INTRAVENOUS

## 2017-07-14 MED ORDER — SODIUM CHLORIDE 0.9 % IV SOLN: INTRAVENOUS | Status: DC

## 2017-07-14 MED ORDER — FLUCONAZOLE 100 MG PO TABS
200.0000 mg | ORAL_TABLET | Freq: Every day | ORAL | Status: DC
  Administered 2017-07-14 – 2017-07-15 (×2): 200 mg via ORAL
  Filled 2017-07-14 (×2): qty 2

## 2017-07-14 MED ORDER — ONDANSETRON HCL 4 MG PO TABS: 4.0000 mg | ORAL_TABLET | Freq: Four times a day (QID) | ORAL | Status: DC | PRN

## 2017-07-14 MED ORDER — SODIUM CHLORIDE 0.9 % IV BOLUS: 1000.0000 mL | Freq: Once | INTRAVENOUS | Status: DC

## 2017-07-14 MED ORDER — BICTEGRAVIR-EMTRICITAB-TENOFOV 50-200-25 MG PO TABS
1.0000 | ORAL_TABLET | Freq: Every day | ORAL | Status: DC
  Administered 2017-07-15: 1 via ORAL
  Filled 2017-07-14 (×4): qty 1

## 2017-07-14 MED ORDER — PANTOPRAZOLE SODIUM 40 MG PO TBEC
40.0000 mg | DELAYED_RELEASE_TABLET | Freq: Every day | ORAL | Status: DC
  Administered 2017-07-14 – 2017-07-15 (×2): 40 mg via ORAL
  Filled 2017-07-14 (×2): qty 1

## 2017-07-14 NOTE — Progress Notes (Signed)
PROGRESS NOTE    Shirley White  LZJ:[id number] DOB: [birth year] DOA: 07/13/2017 PCP: Neva Seat, MD    Brief Narrative:  47 year old female who presented with generalized weakness, dyspnea, chest pain and lightheadedness. She does have the significant past medical history for HIV/AIDS, chronic anemia, menorrhagia, sickle cell trait and GERD. He percent of worsening weakness, easy fatigability, dyspnea and intermittent chest pain. Last menstrual period mid March with a few bleeding for 7 days,  about 5 pads per day. Initial physical examination blood pressure 150/81, heart rate 79, rest rate 17, saturation 100%, temperature 99. She was awake and alert, pale, lungs clear to auscultation, heart S1-S2 present rhythmic, the abdomen was soft nontender, no lower extremity edema. Her hemoglobin was 6.4 and hematocrit 23.3. Platelets 432, white count 6.1.  Patient was admitted to the hospital with the working diagnosis of symptomatic anemia due to acute blood loss, vaginal bleeding.    Assessment & Plan:   Principal Problem:   Symptomatic anemia Active Problems:   Human immunodeficiency virus I infection (HCC)   Nonspecific chest pain   AIDS (HCC)   Oral thrush   Nausea vomiting and diarrhea   1. Acute blood loss anemia. Blood loss due to vaginal bleeding due to fibroids, patient sp 2 units prbc transfusion with improvement on Hb and hCT.Will need referral for outpatient GYN.  2. Iron deficiency anemia. Patient had IV iron infusion, will need follow up iron stores as outpatient.  3. HIV-AIDS. Resumed on antiretroviral agents, with good toleration.  4. Oral thrush. Continue diflucan.     DVT prophylaxis: scd  Code Status: full Family Communication: I spoke with patient's family at the bedside and all questions were addressed.  Disposition Plan: home in am if stable hb and hct.   Consultants:     Procedures:     Antimicrobials:       Subjective: Patient is  feeling better, dyspnea has improved, no chest pain. No abdominal pain.   Objective: Vitals:   07/14/17 1330 07/14/17 1345 07/14/17 1400 07/14/17 1508  BP: (!) 141/80 (!) 154/92 115/72 (!) 163/89  Pulse: 73 72 71 73  Resp: 18 18 14 16   Temp:    98.5 F (36.9 C)  TempSrc:    Oral  SpO2: 100% 100% 100% 100%  Weight:    40 kg (88 lb 2.9 oz)  Height:    4\' 9"  (1.448 m)    Intake/Output Summary (Last 24 hours) at 07/14/2017 1648 Last data filed at 07/14/2017 1600 Gross per 24 hour  Intake 1270 ml  Output -  Net 1270 ml   Filed Weights   07/13/17 2025 07/14/17 1508  Weight: 44.5 kg (98 lb) 40 kg (88 lb 2.9 oz)    Examination:   General: Not in pain or dyspnea, deconditioned Neurology: Awake and alert, non focal  E ENT: positive pallor, no icterus, oral mucosa moist Cardiovascular: No JVD. S1-S2 present, rhythmic, no gallops, rubs, or murmurs. No lower extremity edema. Pulmonary: vesicular breath sounds bilaterally, adequate air movement, no wheezing, rhonchi or rales. Gastrointestinal. Abdomen flat, no organomegaly, non tender, no rebound or guarding Skin. No rashes Musculoskeletal: no joint deformities     Data Reviewed: I have personally reviewed following labs and imaging studies  CBC: Recent Labs  Lab 07/13/17 2039 07/14/17 0229 07/14/17 1516  WBC 6.1 6.0 6.4  HGB 6.4* 8.1* 9.4*  HCT 23.3* 27.4* 31.1*  MCV 59.7* 63.3* 64.7*  PLT 432* 412* 024   Basic Metabolic Panel: Recent  Labs  Lab 07/13/17 2039 07/14/17 0229  NA 138 140  K 3.5 3.3*  CL 106 108  CO2 25 24  GLUCOSE 100* 81  BUN 8 5*  CREATININE 0.56 0.44  CALCIUM 8.6* 8.3*   GFR: Estimated Creatinine Clearance: 54.1 mL/min (by C-G formula based on SCr of 0.44 mg/dL). Liver Function Tests: No results for input(s): AST, ALT, ALKPHOS, BILITOT, PROT, ALBUMIN in the last 168 hours. Recent Labs  Lab 07/14/17 0217  LIPASE 37   No results for input(s): AMMONIA in the last 168 hours. Coagulation  Profile: Recent Labs  Lab 07/14/17 0217  INR 1.09   Cardiac Enzymes: Recent Labs  Lab 07/14/17 0229  TROPONINI <0.03   BNP (last 3 results) No results for input(s): PROBNP in the last 8760 hours. HbA1C: No results for input(s): HGBA1C in the last 72 hours. CBG: Recent Labs  Lab 07/14/17 0855  GLUCAP 84   Lipid Profile: No results for input(s): CHOL, HDL, LDLCALC, TRIG, CHOLHDL, LDLDIRECT in the last 72 hours. Thyroid Function Tests: No results for input(s): TSH, T4TOTAL, FREET4, T3FREE, THYROIDAB in the last 72 hours. Anemia Panel: Recent Labs    07/14/17 0111  VITAMINB12 213  FOLATE 14.4  FERRITIN 4*  TIBC 420  IRON 21*  RETICCTPCT 0.9      Radiology Studies: I have reviewed all of the imaging during this hospital visit personally     Scheduled Meds: . bictegravir-emtricitabine-tenofovir AF  1 tablet Oral Daily  . ferrous sulfate  325 mg Oral BID WC  . fluconazole  200 mg Oral Daily  . pantoprazole  40 mg Oral Q1200  . [START ON 07/15/2017] pneumococcal 23 valent vaccine  0.5 mL Intramuscular Tomorrow-1000   Continuous Infusions: . sodium chloride       LOS: 0 days        Zyan Coby Gerome Apley, MD [facility] Pager [phone]

## 2017-07-14 NOTE — ED Notes (Signed)
ED Provider at bedside. 

## 2017-07-14 NOTE — H&P (Addendum)
History and Physical    Shirley White GDJ:[id number] DOB: Jul 11, 1970 DOA: 07/13/2017  Referring MD/NP/PA:   PCP: Neva Seat, MD   Patient coming from:  The patient is coming from home.  At baseline, pt is independent for most of ADL.   Chief Complaint: generalized weakness, shortness of breath, chest pain, lightheadedness  HPI: Shirley White is a 47 y.o. 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  Pt states that she has been feeling weak recently, which has been progressively getting worse. Since yesterday she started having shortness of breath, intermittent chest pain, lightheadedness and dizziness. Her chest pain is located in the substernal area, sharp, 8 out of 10 in safety, radiating to her throat. She has cough with yellow colored mucus production, no fever or chills. Patient also reports nausea, vomiting, diarrhea. She vomited 3 times. She states that she has had 2 loose bowel movement today. Denies abdominal pain. No symptoms of UTI or unilateral weakness. Patient states that she ran out of her HIV medications on [place]. Patient states that she has history of heavy menstrual period. Her last period was on second week of March which was heavy, lasted for 7 days. Currently patient does not have menstrual period bleeding, no hematochezia, hematemesis or hematuria.  ED Course: pt was found to have hemoglobin troponin from 8.1 on 03/08/17-->6.4, negative FOBT, negative troponin, negative pregnancy test, electrolytes renal function okay, temperature 99.2, no tachycardia, no tachypnea, oxygen saturation 100% on room air, chest x-ray negative. Patient is placed on telemetry bed for observation.  Review of Systems:   General: no fevers, has chills, has poor appetite, has fatigue HEENT: no blurry  vision, hearing changes or sore throat Respiratory: has dyspnea, coughing, no wheezing CV: no chest pain, no palpitations GI: has nausea, vomiting, diarrhea, no constipation abdominal pain, GU: no dysuria, burning on urination, increased urinary frequency, hematuria. Has heavy menstrual period. Ext: no leg edema Neuro: no unilateral weakness, numbness, or tingling, no vision change or hearing loss Skin: no rash, no skin tear. MSK: No muscle spasm, no deformity, no limitation of range of movement in spin Heme: No easy bruising.  Travel history: No recent long distant travel.  Allergy: No Known Allergies  Past Medical History:  Diagnosis Date  . Anemia   . GERD (gastroesophageal reflux disease)   . HIV disease ([place])   . Hypertension     Past Surgical History:  Procedure Laterality Date  . CESAREAN SECTION    . ESOPHAGOGASTRODUODENOSCOPY N/A 02/03/2016   Procedure: ESOPHAGOGASTRODUODENOSCOPY (EGD);  Surgeon: Manus Gunning, MD;  Location: [facility];  Service: Gastroenterology;  Laterality: N/A;    Social History:  reports that she has never smoked. She has never used smokeless tobacco. She reports that she drinks about 0.6 oz of alcohol per week. She reports that she does not use drugs.  Family History:  Family History  Problem Relation Age of Onset  . Hyperlipidemia Mother   . Hypertension Mother   . Cancer Father      Prior to Admission medications   Medication Sig Start Date End Date Taking? Authorizing Provider  bictegravir-emtricitabine-tenofovir AF (BIKTARVY) 50-200-25 MG TABS tablet Take 1 tablet by mouth daily. Try to take at the same time each day with or without food. 11/13/16  Yes [place] Callas, NP  azithromycin (ZITHROMAX) 600 MG tablet Take 2 tablets (  1,200 mg total) by mouth once a week. Patient not taking: Reported on 07/13/2017 03/08/17   Kinnie Feil, PA-C  BIKTARVY 50-200-25 MG TABS tablet TAKE 1 TABLET BY MOUTH DAILY, TO TAKE AT THE SAME  TIME EACH DAY WITH OR WITHOUT FOOD Patient not taking: Reported on 07/13/2017 11/28/16   [place] Callas, NP  nystatin (MYCOSTATIN) 100000 UNIT/ML suspension Take 5 mLs (500,000 Units total) by mouth 4 (four) times daily. Patient not taking: Reported on 07/13/2017 10/21/16   Antonietta Breach, PA-C  sulfamethoxazole-trimethoprim (BACTRIM,SEPTRA) 200-40 MG/5ML suspension Take 20 mLs by mouth daily. Patient not taking: Reported on 07/13/2017 11/01/16   Velvet Bathe, MD    Physical Exam: Vitals:   07/13/17 2025 07/13/17 2026 07/13/17 2328 07/14/17 0200  BP:  (!) 158/81 119/62 (!) 146/80  Pulse:  79 78 84  Resp:  17 16 14   Temp:  99.2 F (37.3 C)    TempSrc:  Oral    SpO2:  100% 100% 100%  Weight: 44.5 kg (98 lb)     Height: 5\' 1"  (1.549 m)      General: Not in acute distress. Pale looking. HEENT:       Eyes: PERRL, EOMI, no scleral icterus.       ENT: No discharge from the ears and nose, no pharynx injection, no tonsillar enlargement. Has oral thrush.       Neck: No JVD, no bruit, no mass felt. Heme: No neck lymph node enlargement. Cardiac: S1/S2, RRR, No murmurs, No gallops or rubs. Respiratory: No rales, wheezing, rhonchi or rubs. GI: Soft, nondistended, nontender, no rebound pain, no organomegaly, BS present. GU: No hematuria Ext: No pitting leg edema bilaterally. 2+DP/PT pulse bilaterally. Musculoskeletal: No joint deformities, No joint redness or warmth, no limitation of ROM in spin. Skin: No rashes.  Neuro: Alert, oriented X3, cranial nerves II-XII grossly intact, moves all extremities normally.  Psych: Patient is not psychotic, no suicidal or hemocidal ideation.  Labs on Admission: I have personally reviewed following labs and imaging studies  CBC: Recent Labs  Lab 07/13/17 2039  WBC 6.1  HGB 6.4*  HCT 23.3*  MCV 59.7*  PLT 885*   Basic Metabolic Panel: Recent Labs  Lab 07/13/17 2039  NA 138  K 3.5  CL 106  CO2 25  GLUCOSE 100*  BUN 8  CREATININE 0.56  CALCIUM  8.6*   GFR: Estimated Creatinine Clearance: 62.4 mL/min (by C-G formula based on SCr of 0.56 mg/dL). Liver Function Tests: No results for input(s): AST, ALT, ALKPHOS, BILITOT, PROT, ALBUMIN in the last 168 hours. No results for input(s): LIPASE, AMYLASE in the last 168 hours. No results for input(s): AMMONIA in the last 168 hours. Coagulation Profile: No results for input(s): INR, PROTIME in the last 168 hours. Cardiac Enzymes: No results for input(s): CKTOTAL, CKMB, CKMBINDEX, TROPONINI in the last 168 hours. BNP (last 3 results) No results for input(s): PROBNP in the last 8760 hours. HbA1C: No results for input(s): HGBA1C in the last 72 hours. CBG: No results for input(s): GLUCAP in the last 168 hours. Lipid Profile: No results for input(s): CHOL, HDL, LDLCALC, TRIG, CHOLHDL, LDLDIRECT in the last 72 hours. Thyroid Function Tests: No results for input(s): TSH, T4TOTAL, FREET4, T3FREE, THYROIDAB in the last 72 hours. Anemia Panel: Recent Labs    07/14/17 0111  RETICCTPCT 0.9   Urine analysis:    Component Value Date/Time   COLORURINE YELLOW 10/27/2016 1952   APPEARANCEUR HAZY (A) 10/27/2016 1952   LABSPEC 1.008  10/27/2016 1952   PHURINE 6.0 10/27/2016 1952   GLUCOSEU NEGATIVE 10/27/2016 1952   GLUCOSEU NEG mg/dL 05/21/2006 2245   HGBUR LARGE (A) 10/27/2016 1952   BILIRUBINUR NEGATIVE 10/27/2016 1952   KETONESUR 20 (A) 10/27/2016 1952   PROTEINUR 100 (A) 10/27/2016 1952   UROBILINOGEN 1.0 11/16/2014 1341   NITRITE POSITIVE (A) 10/27/2016 1952   LEUKOCYTESUR MODERATE (A) 10/27/2016 1952   Sepsis Labs: @LABRCNTIP (procalcitonin:4,lacticidven:4) )No results found for this or any previous visit (from the past 240 hour(s)).   Radiological Exams on Admission: Dg Chest 2 View  Result Date: 07/13/2017 CLINICAL DATA:  Chest pain EXAM: CHEST - 2 VIEW COMPARISON:  03/08/2017 FINDINGS: The heart size and mediastinal contours are within normal limits. Both lungs are clear. The  visualized skeletal structures are unremarkable. IMPRESSION: No active cardiopulmonary disease. Electronically Signed   By: Donavan Foil M.D.   On: 07/13/2017 21:20     EKG: Independently reviewed.  Inus rhythm, QTC 440, low voltage, RAD, nonspecific T-wave change   Assessment/Plan Principal Problem:   Symptomatic anemia Active Problems:   Human immunodeficiency virus I infection (HCC)   Nonspecific chest pain   AIDS (HCC)   Oral thrush   Nausea vomiting and diarrhea   Symptomatic anemia: likely due to heavy menstrual perorid. Hgb 8.1-->6.4. Curently hemodynamically stable. No active vaginal bleeding currently. -will place on tele bed for obs  -IVF: NS 500 cc -will transfuse 2 units of blood -get US-transvaginal  -may give referral to OB/Gyn - INR/PTT/type & screen and Anemia panel  Addendum: anemia panel showed iron deficiency -Will give 1 dose of IV feraheme 510 mg, and start oral ferrous sulfate supplement -when necessary Senokot  Oral thrush:  -Fluconazole 200 mg daily  Nonspecific chest pain: etiology is not clear. May be due to acid reflux versus esophageal candidiasis. Patient has low-risk for ACS, but demand ischemia is possible. Initial troponin negative. -troponin 3 -When necessary Percocet for pain - Protonix for possible acid reflux  Human immunodeficiency virus I infection and AIDS: CD4=40 and VL=5880 on 12/16/16. Pt is not compliant to HIV medications. Patient is supposed to follow up with Dr. Linus Salmons and NP, Steffanie Rainwater, but she seems not to have followed up it ID consistently. -Continue home Biktarvy -check CD4 and VL  Nausea vomiting and diarrhea: etiology is not clear. Patient does not have abdominal pain, no fever or chills. Maybe related to HIV. Dose not seem to have C diff colitis. -Check lipase -observe closely -IV as above -prn zofran     DVT ppx: SCD Code Status: Full code Family Communication: None at bed side.     Disposition Plan:   Anticipate discharge back to previous home environment Consults called:  none Admission status: Obs / tele   Date of Service 07/14/2017    Ivor Costa [facility] Pager [phone]  If 7PM-7AM, please contact night-coverage [email or web address] Password [facility] 07/14/2017, 2:29 AM

## 2017-07-14 NOTE — ED Notes (Signed)
Patient CBG was 84.

## 2017-07-14 NOTE — ED Notes (Signed)
Pt signed consent of blood product administration. Pt educated on blood product administration and denied further questions. Consent at bedside.

## 2017-07-14 NOTE — ED Provider Notes (Signed)
[facility] EMERGENCY DEPARTMENT Provider Note   CSN: [id number] Arrival date & time: 07/13/17  2016     History   Chief Complaint Chief Complaint  Patient presents with  . Chest Pain    HPI Shirley White is a 47 y.o. female.  Patient with past medical history remarkable for AIDS (last CD4 count 40, viral load ~5000), sickle cell, esophageal candidiasis, and iron deficiency anemia, presents to the emergency department with a chief complaint of chest pain.  She reports associated fatigue and lightheadedness with slight shortness of breath, but denies shortness of breath now.  States that she has had a cough and sore throat as well as nausea, vomiting, diarrhea for the past several days.  She states that she has not been compliant in taking her regular medications because she has run out of them.  She denies any blood in her stools, but states that she has had heavy menses.  She denies bleeding now.  The history is provided by the patient. No language interpreter was used.    Past Medical History:  Diagnosis Date  . Anemia   . GERD (gastroesophageal reflux disease)   . HIV disease ([place])   . Hypertension     Patient Active Problem List   Diagnosis Date Noted  . Hypokalemia 10/28/2016  . Routine health maintenance 08/28/2016  . Immunocompromised status associated with infection ([place]) 02/03/2016  . Esophageal candidiasis ([place]) 02/02/2016  . Chest pain 02/02/2016  . AIDS ([place]) 02/02/2016  . Onychomycosis 12/13/2015  . Iron deficiency anemia due to chronic blood loss   . H/O menorrhagia 09/05/2014  . Human immunodeficiency virus I infection ([place]) 06/04/2006  . Sickle-cell trait ([place]) 06/04/2006    Past Surgical History:  Procedure Laterality Date  . CESAREAN SECTION    . ESOPHAGOGASTRODUODENOSCOPY N/A 02/03/2016   Procedure: ESOPHAGOGASTRODUODENOSCOPY (EGD);  Surgeon: Manus Gunning, MD;  Location: [facility];  Service: Gastroenterology;   Laterality: N/A;     OB History    Gravida  4   Para  4   Term  4   Preterm      AB      Living        SAB      TAB      Ectopic      Multiple      Live Births               Home Medications    Prior to Admission medications   Medication Sig Start Date End Date Taking? Authorizing Provider  bictegravir-emtricitabine-tenofovir AF (BIKTARVY) 50-200-25 MG TABS tablet Take 1 tablet by mouth daily. Try to take at the same time each day with or without food. 11/13/16  Yes [place] Callas, NP  azithromycin (ZITHROMAX) 600 MG tablet Take 2 tablets (1,200 mg total) by mouth once a week. Patient not taking: Reported on 07/13/2017 03/08/17   Kinnie Feil, PA-C  BIKTARVY 50-200-25 MG TABS tablet TAKE 1 TABLET BY MOUTH DAILY, TO TAKE AT THE SAME TIME EACH DAY WITH OR WITHOUT FOOD Patient not taking: Reported on 07/13/2017 11/28/16   [place] Callas, NP  nystatin (MYCOSTATIN) 100000 UNIT/ML suspension Take 5 mLs (500,000 Units total) by mouth 4 (four) times daily. Patient not taking: Reported on 07/13/2017 10/21/16   Antonietta Breach, PA-C  sulfamethoxazole-trimethoprim (BACTRIM,SEPTRA) 200-40 MG/5ML suspension Take 20 mLs by mouth daily. Patient not taking: Reported on 07/13/2017 11/01/16   Velvet Bathe, MD    Family History  Family History  Problem Relation Age of Onset  . Hyperlipidemia Mother   . Hypertension Mother   . Cancer Father     Social History Social History   Tobacco Use  . Smoking status: Never Smoker  . Smokeless tobacco: Never Used  Substance Use Topics  . Alcohol use: Yes    Alcohol/week: 0.6 oz    Types: 1 Cans of beer per week    Comment: occa  . Drug use: No    Frequency: 7.0 times per week    Types: Marijuana     Allergies   Patient has no known allergies.   Review of Systems Review of Systems  All other systems reviewed and are negative.    Physical Exam Updated Vital Signs BP 119/62   Pulse 78   Temp 99.2 F (37.3 C) (Oral)    Resp 16   Ht 5\' 1"  (1.549 m)   Wt 44.5 kg (98 lb)   LMP 06/22/2017   SpO2 100%   BMI 18.52 kg/m   Physical Exam  Constitutional: She is oriented to person, place, and time. She appears well-developed and well-nourished.  HENT:  Head: Normocephalic and atraumatic.  White plaques on tongue, palate and oropharynx  Eyes: Pupils are equal, round, and reactive to light. Conjunctivae and EOM are normal.  Neck: Normal range of motion. Neck supple.  Cardiovascular: Normal rate and regular rhythm. Exam reveals no gallop and no friction rub.  No murmur heard. Pulmonary/Chest: Effort normal and breath sounds normal. No respiratory distress. She has no wheezes. She has no rales. She exhibits no tenderness.  Clear to auscultation bilaterally  Abdominal: Soft. Bowel sounds are normal. She exhibits no distension and no mass. There is no tenderness. There is no rebound and no guarding.  Musculoskeletal: Normal range of motion. She exhibits no edema or tenderness.  Neurological: She is alert and oriented to person, place, and time.  Skin: Skin is warm and dry.  Psychiatric: She has a normal mood and affect. Her behavior is normal. Judgment and thought content normal.  Nursing note and vitals reviewed.    ED Treatments / Results  Labs (all labs ordered are listed, but only abnormal results are displayed) Labs Reviewed  BASIC METABOLIC PANEL - Abnormal; Notable for the following components:      Result Value   Glucose, Bld 100 (*)    Calcium 8.6 (*)    All other components within normal limits  CBC - Abnormal; Notable for the following components:   Hemoglobin 6.4 (*)    HCT 23.3 (*)    MCV 59.7 (*)    MCH 16.4 (*)    MCHC 27.5 (*)    RDW 19.1 (*)    Platelets 432 (*)    All other components within normal limits  VITAMIN B12  FOLATE  IRON AND TIBC  FERRITIN  RETICULOCYTES  I-STAT TROPONIN, ED  I-STAT BETA HCG BLOOD, ED ([facility], [facility], [facility] ONLY)  POC OCCULT BLOOD, ED  PREPARE RBC  (CROSSMATCH)  TYPE AND SCREEN    EKG EKG Interpretation  Date/Time:  Monday July 13 2017 20:23:18 EDT Ventricular Rate:  78 PR Interval:  140 QRS Duration: 88 QT Interval:  386 QTC Calculation: 440 R Axis:   89 Text Interpretation:  Normal sinus rhythm Normal ECG When compared with ECG of 03/08/2017, No significant change was found Confirmed by Delora Fuel ([id number]) on 07/14/2017 12:00:43 AM   Radiology Dg Chest 2 View  Result Date: 07/13/2017 CLINICAL DATA:  Chest pain EXAM: CHEST - 2 VIEW COMPARISON:  03/08/2017 FINDINGS: The heart size and mediastinal contours are within normal limits. Both lungs are clear. The visualized skeletal structures are unremarkable. IMPRESSION: No active cardiopulmonary disease. Electronically Signed   By: Donavan Foil M.D.   On: 07/13/2017 21:20    Procedures Procedures (including critical care time) CRITICAL CARE Performed by: Montine Circle Symptomatic anemia requiring blood transfusion for hemoglobin of 6.4  Total critical care time: 37 minutes  Critical care time was exclusive of separately billable procedures and treating other patients.  Critical care was necessary to treat or prevent imminent or life-threatening deterioration.  Critical care was time spent personally by me on the following activities: development of treatment plan with patient and/or surrogate as well as nursing, discussions with consultants, evaluation of patient's response to treatment, examination of patient, obtaining history from patient or surrogate, ordering and performing treatments and interventions, ordering and review of laboratory studies, ordering and review of radiographic studies, pulse oximetry and re-evaluation of patient's condition.  Medications Ordered in ED Medications  0.9 %  sodium chloride infusion (has no administration in time range)     Initial Impression / Assessment and Plan / ED Course  I have reviewed the triage vital signs and the nursing  notes.  Pertinent labs & imaging results that were available during my care of the patient were reviewed by me and considered in my medical decision making (see chart for details).     Patient with chest pain, fatigue, generalized weakness.  Hemoglobin is 6.4, down from 8.  She reports having had heavy menses, but is not bleeding now.  She feels lightheaded and weak.  She has required blood transfusion past.  I believe her low hemoglobin to be the cause of many of her symptoms today, and will transfuse her.  Patient seen by discussed with Dr. Roxanne Mins, who agrees with the plan.  Final Clinical Impressions(s) / ED Diagnoses   Final diagnoses:  Symptomatic anemia  Oral candidiasis  Nonspecific chest pain    ED Discharge Orders    None       Montine Circle, PA-C 17/51/02 [id number]    Delora Fuel, MD 77/82/42 [phone]

## 2017-07-14 NOTE — ED Notes (Signed)
Patient transported to Ultrasound 

## 2017-07-14 NOTE — Progress Notes (Signed)
PROGRESS NOTE    Shirley White  AOZ:[id number] DOB: Oct 17, 1970 DOA: 07/13/2017 PCP: Neva Seat, MD  Outpatient Specialists:    Brief Narrative:  47 year old female presented to ED with generalized weakness, lightheadedness, shortness of breath, chest pain, and nausea. History of HIV/AIDS (12/16/16 CD4=40, VL=5880), GERD, anemia, menorrhagia, sickle cell trait. Patient ran out of HIV medication on [place] and states she has been feeling weak recently but it has gotten worse over last two days. She received a blood transfusion 6 months ago for similar symptoms. She has felt weakness, chest pain, shortness of breath, and nausea for the past two days. Chest pain was constant, stabbing in quality, with no aggravating or relieving factors, no radiation. Currently, chest pain and shortness of breath have resolved. Continues to feel mild nausea and chest discomfort which she believes is her GERD. Diarrhea has resolved. She has intermittent cough producing yellow mucus. Denies fever, chills, abdominal pain, hematemesis. Last menstrual period was second week of March and lasted 7 days. States that menstrual periods have always been heavy, usually using 5 pads/tampons a day on heaviest days. CD4 50, Hgb 6.4, HCT 27.4, MCV 63.3, Iron 21, Ferritin 4. Chest X-ray was unremarkable for cardiopulmonary disease. EKG showed sinus rhythm, QTC 440, low voltage, RAD, nonspecific T-wave change. Pelvic US showed leiomyomas within the uterus, largest measuring 4.1 x 3.7x 4.3 cm.   Patient admitted to unit on working diagnosis of symptomatic anemia due to acute blood loss in the setting of iron deficiency.  Assessment & Plan:   Principal Problem:   Symptomatic anemia Active Problems:   Human immunodeficiency virus I infection (HCC)   Nonspecific chest pain   AIDS (HCC)   Oral thrush   Nausea vomiting and diarrhea  1. Symptomatic Anemia due to acute blood loss in the setting of iron deficiency: - Hgb 6.4,  Iron 21, Ferritin 4 - may be due to menstrual bleeding and leiomyomas of uterus - transfused 2 units of blood - 1 dose of IV feraheme 510 mg received, oral ferrous sulfate supplement recieved - Continue Senokot 1 tab PRN  2. HIV 1 infection and AIDS: - Today CD4= 50, CD4%=2  - Started Biktarvy QD today  3. Non-speficic chest pain: - etiology unclear, may be due to GERD - continue trending troponin, troponin today <0.03 - Started Protonix 40 mg QD for GERD  4. Oral thrush - Fluconazole 200 mg given   5. Leiomyomas of Uterus: - Negative betaHCG - refer to OB/GYN    DVT prophylaxis: SCD's Code Status: Full Family Communication: No family in room Disposition Plan: Continue inpatient treat until medically appropriate.   Consultants:  Procedures:   Antimicrobials:    Subjective: Patient appear weak and tired. When spoken to is calm and able to answer all questions without problem.  Objective: Vitals:   07/14/17 0942 07/14/17 0943 07/14/17 1015 07/14/17 1017  BP: (!) 158/88 (!) 158/88 (!) 150/89   Pulse: 75 75 77   Resp: 20 20 17    Temp: 98.8 F (37.1 C) 98.8 F (37.1 C)  98.3 F (36.8 C)  TempSrc: Oral Oral  Oral  SpO2: 100% 100% 100%   Weight:      Height:        Intake/Output Summary (Last 24 hours) at 07/14/2017 1051 Last data filed at 07/14/2017 0950 Gross per 24 hour  Intake 1150 ml  Output -  Net 1150 ml   Filed Weights   07/13/17 2025  Weight: 44.5 kg (98  lb)    Examination:  General exam: Appears calm and comfortable, mild pallor. ENT: Mucus membranes appear warm and moist. Oral thrush. Respiratory system: Clear to auscultation. Respiratory effort normal. Cardiovascular system: S1 & S2 heard, RRR. No JVD, murmurs, rubs, gallops or clicks. No pedal edema. Gastrointestinal system: Abdomen is nondistended, soft and nontender. Normal bowel sounds heard. Central nervous system: Alert and oriented. No focal neurological deficits. Extremities:  Symmetric 5 x 5 power. Skin: No rashes, lesions or ulcers Psychiatry: Judgement and insight appear normal. Mood & affect appropriate.     Data Reviewed: I have personally reviewed following labs and imaging studies  CBC: Recent Labs  Lab 07/13/17 2039 07/14/17 0229  WBC 6.1 6.0  HGB 6.4* 8.1*  HCT 23.3* 27.4*  MCV 59.7* 63.3*  PLT 432* 829*   Basic Metabolic Panel: Recent Labs  Lab 07/13/17 2039 07/14/17 0229  NA 138 140  K 3.5 3.3*  CL 106 108  CO2 25 24  GLUCOSE 100* 81  BUN 8 5*  CREATININE 0.56 0.44  CALCIUM 8.6* 8.3*   GFR: Estimated Creatinine Clearance: 62.4 mL/min (by C-G formula based on SCr of 0.44 mg/dL). Liver Function Tests: No results for input(s): AST, ALT, ALKPHOS, BILITOT, PROT, ALBUMIN in the last 168 hours. Recent Labs  Lab 07/14/17 0217  LIPASE 37   No results for input(s): AMMONIA in the last 168 hours. Coagulation Profile: Recent Labs  Lab 07/14/17 0217  INR 1.09   Cardiac Enzymes: Recent Labs  Lab 07/14/17 0229  TROPONINI <0.03   BNP (last 3 results) No results for input(s): PROBNP in the last 8760 hours. HbA1C: No results for input(s): HGBA1C in the last 72 hours. CBG: Recent Labs  Lab 07/14/17 0855  GLUCAP 84   Lipid Profile: No results for input(s): CHOL, HDL, LDLCALC, TRIG, CHOLHDL, LDLDIRECT in the last 72 hours. Thyroid Function Tests: No results for input(s): TSH, T4TOTAL, FREET4, T3FREE, THYROIDAB in the last 72 hours. Anemia Panel: Recent Labs    07/14/17 0111  VITAMINB12 213  FOLATE 14.4  FERRITIN 4*  TIBC 420  IRON 21*  RETICCTPCT 0.9   Urine analysis:    Component Value Date/Time   COLORURINE YELLOW 10/27/2016 1952   APPEARANCEUR HAZY (A) 10/27/2016 1952   LABSPEC 1.008 10/27/2016 1952   PHURINE 6.0 10/27/2016 1952   GLUCOSEU NEGATIVE 10/27/2016 1952   GLUCOSEU NEG mg/dL 05/21/2006 2245   HGBUR LARGE (A) 10/27/2016 1952   BILIRUBINUR NEGATIVE 10/27/2016 1952   KETONESUR 20 (A) 10/27/2016 1952    PROTEINUR 100 (A) 10/27/2016 1952   UROBILINOGEN 1.0 11/16/2014 1341   NITRITE POSITIVE (A) 10/27/2016 1952   LEUKOCYTESUR MODERATE (A) 10/27/2016 1952   Sepsis Labs: @LABRCNTIP (procalcitonin:4,lacticidven:4)  )No results found for this or any previous visit (from the past 240 hour(s)).    Radiology Studies: Dg Chest 2 View  Result Date: 07/13/2017 CLINICAL DATA:  Chest pain EXAM: CHEST - 2 VIEW COMPARISON:  03/08/2017 FINDINGS: The heart size and mediastinal contours are within normal limits. Both lungs are clear. The visualized skeletal structures are unremarkable. IMPRESSION: No active cardiopulmonary disease. Electronically Signed   By: Donavan Foil M.D.   On: 07/13/2017 21:20   US Pelvis Transvanginal Non-ob (tv Only)  Result Date: 07/14/2017 CLINICAL DATA:  Menorrhagia EXAM: TRANSABDOMINAL AND TRANSVAGINAL ULTRASOUND OF PELVIS TECHNIQUE: Study was performed transabdominally to optimize pelvic field of view evaluation and transvaginally to optimize internal visceral architecture evaluation. COMPARISON:  None FINDINGS: Uterus Measurements: 7.6 x 6.3 x 6.6 cm.  There is a hypoechoic mass in the rightward lower uterine segment region measuring 4.1 x 3.7 x 4.3 cm. There is a hypoechoic mass abutting the superior aspect of the endometrium slightly to the right of midline measuring 1.5 x 1.4 x 1.5 cm. These areas are felt to represent leiomyomas. Endometrium Thickness: 9 mm.  No focal abnormality visualized. Right ovary Measurements: 2.8 x 2.2 x 1.9 cm. Normal appearance/no adnexal mass. Left ovary Not visualized by either transabdominal or transvaginal technique. No left-sided pelvic mass. Other findings Trace free fluid. IMPRESSION: Leiomyomas within the uterus, largest measuring 4.1 x 3.7 x 4.3 cm. Endometrium does not appear appreciably thickened. Left ovary could not be visualized. No left-sided pelvic mass. Right ovary appears unremarkable. Trace free pelvic fluid may be physiologic.  Electronically Signed   By: Lowella Grip III M.D.   On: 07/14/2017 07:11   US Pelvis (transabdominal Only)  Result Date: 07/14/2017 CLINICAL DATA:  Menorrhagia EXAM: TRANSABDOMINAL AND TRANSVAGINAL ULTRASOUND OF PELVIS TECHNIQUE: Study was performed transabdominally to optimize pelvic field of view evaluation and transvaginally to optimize internal visceral architecture evaluation. COMPARISON:  None FINDINGS: Uterus Measurements: 7.6 x 6.3 x 6.6 cm. There is a hypoechoic mass in the rightward lower uterine segment region measuring 4.1 x 3.7 x 4.3 cm. There is a hypoechoic mass abutting the superior aspect of the endometrium slightly to the right of midline measuring 1.5 x 1.4 x 1.5 cm. These areas are felt to represent leiomyomas. Endometrium Thickness: 9 mm.  No focal abnormality visualized. Right ovary Measurements: 2.8 x 2.2 x 1.9 cm. Normal appearance/no adnexal mass. Left ovary Not visualized by either transabdominal or transvaginal technique. No left-sided pelvic mass. Other findings Trace free fluid. IMPRESSION: Leiomyomas within the uterus, largest measuring 4.1 x 3.7 x 4.3 cm. Endometrium does not appear appreciably thickened. Left ovary could not be visualized. No left-sided pelvic mass. Right ovary appears unremarkable. Trace free pelvic fluid may be physiologic. Electronically Signed   By: Lowella Grip III M.D.   On: 07/14/2017 07:11   Scheduled Meds: . bictegravir-emtricitabine-tenofovir AF  1 tablet Oral Daily  . ferrous sulfate  325 mg Oral BID WC  . fluconazole  200 mg Oral Daily  . pantoprazole  40 mg Oral Q1200   Continuous Infusions: . sodium chloride    . ferumoxytol       LOS: 0 days    Merlene Pulling, PA-S  [facility]  If 7PM-7AM, please contact night-coverage [email or web address] Password [facility] 07/14/2017, 10:51 AM

## 2017-07-15 DIAGNOSIS — R112 Nausea with vomiting, unspecified: Secondary | ICD-10-CM

## 2017-07-15 DIAGNOSIS — B20 Human immunodeficiency virus [HIV] disease: Secondary | ICD-10-CM

## 2017-07-15 DIAGNOSIS — R197 Diarrhea, unspecified: Secondary | ICD-10-CM

## 2017-07-15 DIAGNOSIS — R079 Chest pain, unspecified: Secondary | ICD-10-CM

## 2017-07-15 DIAGNOSIS — N920 Excessive and frequent menstruation with regular cycle: Secondary | ICD-10-CM

## 2017-07-15 DIAGNOSIS — D649 Anemia, unspecified: Secondary | ICD-10-CM

## 2017-07-15 LAB — TYPE AND SCREEN
ABO/RH(D): B POS
Antibody Screen: NEGATIVE
Unit division: 0
Unit division: 0

## 2017-07-15 LAB — BPAM RBC
BLOOD PRODUCT EXPIRATION DATE: 201904122359
Blood Product Expiration Date: 201904112359
ISSUE DATE / TIME: 201904020232
ISSUE DATE / TIME: 201904020938
UNIT TYPE AND RH: 7300
Unit Type and Rh: 7300

## 2017-07-15 LAB — GLUCOSE, CAPILLARY: GLUCOSE-CAPILLARY: 83 mg/dL (ref 65–99)

## 2017-07-15 LAB — HIV-1 RNA QUANT-NO REFLEX-BLD
HIV 1 RNA QUANT: 56800 {copies}/mL
LOG10 HIV-1 RNA: 4.754 log10copy/mL

## 2017-07-15 LAB — HEMOGLOBIN AND HEMATOCRIT, BLOOD
HCT: 32.7 % — ABNORMAL LOW (ref 36.0–46.0)
Hemoglobin: 10 g/dL — ABNORMAL LOW (ref 12.0–15.0)

## 2017-07-15 MED ORDER — FLUCONAZOLE 200 MG PO TABS: 200.0000 mg | ORAL_TABLET | Freq: Every day | ORAL | 0 refills | Status: DC

## 2017-07-15 MED ORDER — KETOROLAC TROMETHAMINE 15 MG/ML IJ SOLN
15.0000 mg | Freq: Once | INTRAMUSCULAR | Status: AC
  Administered 2017-07-15: 15 mg via INTRAVENOUS
  Filled 2017-07-15: qty 1

## 2017-07-15 MED ORDER — NORETHINDRONE ACETATE 5 MG PO TABS: 5.0000 mg | ORAL_TABLET | Freq: Every day | ORAL | Status: DC

## 2017-07-15 MED ORDER — PANTOPRAZOLE SODIUM 40 MG PO TBEC: 40.0000 mg | DELAYED_RELEASE_TABLET | Freq: Every day | ORAL | 0 refills | Status: DC

## 2017-07-15 MED ORDER — NORETHINDRONE ACETATE 5 MG PO TABS: 5.0000 mg | ORAL_TABLET | Freq: Every day | ORAL | 0 refills | Status: DC

## 2017-07-15 MED ORDER — METOCLOPRAMIDE HCL 5 MG/ML IJ SOLN
5.0000 mg | Freq: Once | INTRAMUSCULAR | Status: AC
  Administered 2017-07-15: 5 mg via INTRAVENOUS
  Filled 2017-07-15: qty 2

## 2017-07-15 MED ORDER — DIPHENHYDRAMINE HCL 50 MG/ML IJ SOLN
12.5000 mg | Freq: Once | INTRAMUSCULAR | Status: AC
  Administered 2017-07-15: 12.5 mg via INTRAVENOUS
  Filled 2017-07-15: qty 1

## 2017-07-15 MED ORDER — FERROUS SULFATE 325 (65 FE) MG PO TABS: 325.0000 mg | ORAL_TABLET | Freq: Every day | ORAL | 0 refills | Status: DC

## 2017-07-15 MED ORDER — SENNOSIDES-DOCUSATE SODIUM 8.6-50 MG PO TABS: 1.0000 | ORAL_TABLET | Freq: Every evening | ORAL | 0 refills | Status: DC | PRN

## 2017-07-15 NOTE — Discharge Summary (Signed)
Discharge Summary  Shirley White VQM:[id number] DOB: 1971-02-21  PCP: Neva Seat, MD  Admit date: 07/13/2017 Discharge date: 07/15/2017  Time spent: 25 minutes  Recommendations for Outpatient Follow-up:  1. Follow up with PCP 2. Follow up with ID 3. Follow up with Gynecology 4. Take your medications as prescribed   Discharge Diagnoses:  Active Hospital Problems   Diagnosis Date Noted  . Symptomatic anemia 09/05/2014  . Oral thrush 07/14/2017  . Nausea vomiting and diarrhea 07/14/2017  . AIDS ([place]) 02/02/2016  . Nonspecific chest pain 02/02/2016  . Human immunodeficiency virus I infection ([place]) 06/04/2006    Resolved Hospital Problems  No resolved problems to display.    Discharge Condition: stable  Diet recommendation: resume previous diet  Vitals:   07/14/17 2152 07/15/17 0641  BP: (!) 149/94 (!) 145/59  Pulse: 83 80  Resp: 18 20  Temp: 99 F (37.2 C) 98.9 F (37.2 C)  SpO2: 100% 100%    History of present illness:  47 year old female who presented with generalized weakness, dyspnea, chest pain and lightheadedness. She does have the significant past medical history for HIV/AIDS, chronic anemia, menorrhagia, sickle cell trait and GERD. He percent of worsening weakness, easy fatigability, dyspnea and intermittent chest pain. Last menstrual period mid March with a few bleeding for 7 days,  about 5 pads per day. Initial physical examination blood pressure 150/81, heart rate 79, rest rate 17, saturation 100%, temperature 99. She was awake and alert, pale, lungs clear to auscultation, heart S1-S2 present rhythmic, the abdomen was soft nontender, no lower extremity edema. Her hemoglobin was 6.4 and hematocrit 23.3. Platelets 432, white count 6.1.  Patient was admitted to the hospital with the working diagnosis of symptomatic anemia due to acute blood loss, vaginal bleeding.   07/15/2017: Patient seen and examined at her bedside.  She has no new complaints.  The  day of discharge patient was hemodynamically stable.  She will need to follow-up with gynecology, infectious disease, PCP post hospitalization.  Spoke with Dr Si Raider gynecology who recommended starting Northindrone 5 mg daily for heavy menses. He will arrange for her follow up appointment.  Hospital Course:  Principal Problem:   Symptomatic anemia Active Problems:   Human immunodeficiency virus I infection (HCC)   Nonspecific chest pain   AIDS (HCC)   Oral thrush   Nausea vomiting and diarrhea   Procedures:  None  Consultations:  None  Discharge Exam: BP (!) 145/59 (BP Location: Left Arm)   Pulse 80   Temp 98.9 F (37.2 C) (Oral)   Resp 20   Ht 4\' 9"  (1.448 m)   Wt 40 kg (88 lb 2.9 oz)   LMP 06/22/2017   SpO2 100%   BMI 19.08 kg/m   General: 47 year old American female frail in no acute distress.  Alert and oriented x3. Cardiovascular: Regular rate and rhythm with no rubs or gallops.  No JVD or thyromegaly noted. Respiratory: Clear to auscultation with no wheezes or rales.  Discharge Instructions You were cared for by a hospitalist during your hospital stay. If you have any questions about your discharge medications or the care you received while you were in the hospital after you are discharged, you can call the unit and asked to speak with the hospitalist on call if the hospitalist that took care of you is not available. Once you are discharged, your primary care physician will handle any further medical issues. Please note that NO REFILLS for any discharge medications will be authorized once  you are discharged, as it is imperative that you return to your primary care physician (or establish a relationship with a primary care physician if you do not have one) for your aftercare needs so that they can reassess your need for medications and monitor your lab values.   Allergies as of 07/15/2017   No Known Allergies     Medication List    TAKE these medications     azithromycin 600 MG tablet Commonly known as:  ZITHROMAX Take 2 tablets (1,200 mg total) by mouth once a week.   bictegravir-emtricitabine-tenofovir AF 50-200-25 MG Tabs tablet Commonly known as:  BIKTARVY Take 1 tablet by mouth daily. Try to take at the same time each day with or without food.   BIKTARVY 50-200-25 MG Tabs tablet Generic drug:  bictegravir-emtricitabine-tenofovir AF TAKE 1 TABLET BY MOUTH DAILY, TO TAKE AT THE SAME TIME EACH DAY WITH OR WITHOUT FOOD   ferrous sulfate 325 (65 FE) MG tablet Take 1 tablet (325 mg total) by mouth daily.   fluconazole 200 MG tablet Commonly known as:  DIFLUCAN Take 1 tablet (200 mg total) by mouth daily. Start taking on:  07/16/2017   norethindrone 5 MG tablet Commonly known as:  AYGESTIN Take 1 tablet (5 mg total) by mouth daily.   nystatin 100000 UNIT/ML suspension Commonly known as:  MYCOSTATIN Take 5 mLs (500,000 Units total) by mouth 4 (four) times daily.   pantoprazole 40 MG tablet Commonly known as:  PROTONIX Take 1 tablet (40 mg total) by mouth daily at 12 noon.   senna-docusate 8.6-50 MG tablet Commonly known as:  Senokot-S Take 1 tablet by mouth at bedtime as needed for mild constipation.   sulfamethoxazole-trimethoprim 200-40 MG/5ML suspension Commonly known as:  BACTRIM,SEPTRA Take 20 mLs by mouth daily.      No Known Allergies Follow-up Information    Neva Seat, MD Follow up in 5 day(s).   Specialty:  Internal Medicine Contact information: [place] [address] [phone]            The results of significant diagnostics from this hospitalization (including imaging, microbiology, ancillary and laboratory) are listed below for reference.    Significant Diagnostic Studies: Dg Chest 2 View  Result Date: 07/13/2017 CLINICAL DATA:  Chest pain EXAM: CHEST - 2 VIEW COMPARISON:  03/08/2017 FINDINGS: The heart size and mediastinal contours are within normal limits. Both lungs are  clear. The visualized skeletal structures are unremarkable. IMPRESSION: No active cardiopulmonary disease. Electronically Signed   By: Donavan Foil M.D.   On: 07/13/2017 21:20   US Pelvis Transvanginal Non-ob (tv Only)  Result Date: 07/14/2017 CLINICAL DATA:  Menorrhagia EXAM: TRANSABDOMINAL AND TRANSVAGINAL ULTRASOUND OF PELVIS TECHNIQUE: Study was performed transabdominally to optimize pelvic field of view evaluation and transvaginally to optimize internal visceral architecture evaluation. COMPARISON:  None FINDINGS: Uterus Measurements: 7.6 x 6.3 x 6.6 cm. There is a hypoechoic mass in the rightward lower uterine segment region measuring 4.1 x 3.7 x 4.3 cm. There is a hypoechoic mass abutting the superior aspect of the endometrium slightly to the right of midline measuring 1.5 x 1.4 x 1.5 cm. These areas are felt to represent leiomyomas. Endometrium Thickness: 9 mm.  No focal abnormality visualized. Right ovary Measurements: 2.8 x 2.2 x 1.9 cm. Normal appearance/no adnexal mass. Left ovary Not visualized by either transabdominal or transvaginal technique. No left-sided pelvic mass. Other findings Trace free fluid. IMPRESSION: Leiomyomas within the uterus, largest measuring 4.1 x 3.7 x 4.3  cm. Endometrium does not appear appreciably thickened. Left ovary could not be visualized. No left-sided pelvic mass. Right ovary appears unremarkable. Trace free pelvic fluid may be physiologic. Electronically Signed   By: Lowella Grip III M.D.   On: 07/14/2017 07:11   US Pelvis (transabdominal Only)  Result Date: 07/14/2017 CLINICAL DATA:  Menorrhagia EXAM: TRANSABDOMINAL AND TRANSVAGINAL ULTRASOUND OF PELVIS TECHNIQUE: Study was performed transabdominally to optimize pelvic field of view evaluation and transvaginally to optimize internal visceral architecture evaluation. COMPARISON:  None FINDINGS: Uterus Measurements: 7.6 x 6.3 x 6.6 cm. There is a hypoechoic mass in the rightward lower uterine segment region  measuring 4.1 x 3.7 x 4.3 cm. There is a hypoechoic mass abutting the superior aspect of the endometrium slightly to the right of midline measuring 1.5 x 1.4 x 1.5 cm. These areas are felt to represent leiomyomas. Endometrium Thickness: 9 mm.  No focal abnormality visualized. Right ovary Measurements: 2.8 x 2.2 x 1.9 cm. Normal appearance/no adnexal mass. Left ovary Not visualized by either transabdominal or transvaginal technique. No left-sided pelvic mass. Other findings Trace free fluid. IMPRESSION: Leiomyomas within the uterus, largest measuring 4.1 x 3.7 x 4.3 cm. Endometrium does not appear appreciably thickened. Left ovary could not be visualized. No left-sided pelvic mass. Right ovary appears unremarkable. Trace free pelvic fluid may be physiologic. Electronically Signed   By: Lowella Grip III M.D.   On: 07/14/2017 07:11    Microbiology: No results found for this or any previous visit (from the past 240 hour(s)).   Labs: Basic Metabolic Panel: Recent Labs  Lab 07/13/17 2039 07/14/17 0229  NA 138 140  K 3.5 3.3*  CL 106 108  CO2 25 24  GLUCOSE 100* 81  BUN 8 5*  CREATININE 0.56 0.44  CALCIUM 8.6* 8.3*   Liver Function Tests: No results for input(s): AST, ALT, ALKPHOS, BILITOT, PROT, ALBUMIN in the last 168 hours. Recent Labs  Lab 07/14/17 0217  LIPASE 37   No results for input(s): AMMONIA in the last 168 hours. CBC: Recent Labs  Lab 07/13/17 2039 07/14/17 0229 07/14/17 1516 07/15/17 0516  WBC 6.1 6.0 6.4  --   HGB 6.4* 8.1* 9.4* 10.0*  HCT 23.3* 27.4* 31.1* 32.7*  MCV 59.7* 63.3* 64.7*  --   PLT 432* 412* 342  --    Cardiac Enzymes: Recent Labs  Lab 07/14/17 0229 07/14/17 1520  TROPONINI <0.03 <0.03   BNP: BNP (last 3 results) No results for input(s): BNP in the last 8760 hours.  ProBNP (last 3 results) No results for input(s): PROBNP in the last 8760 hours.  CBG: Recent Labs  Lab 07/14/17 0855 07/15/17 0728  GLUCAP 84 83        Signed:  Kayleen Memos, MD [facility] 07/15/2017, 12:35 PM

## 2017-07-23 ENCOUNTER — Ambulatory Visit (HOSPITAL_COMMUNITY)
Admission: EM | Admit: 2017-07-23 | Discharge: 2017-07-23 | Disposition: A | Discharge status: Home / Self Care | Payer: Self-pay | Attending: Internal Medicine | Admitting: Internal Medicine

## 2017-07-23 ENCOUNTER — Encounter (HOSPITAL_COMMUNITY): Payer: Self-pay | Admitting: Emergency Medicine

## 2017-07-23 DIAGNOSIS — K219 Gastro-esophageal reflux disease without esophagitis: Secondary | ICD-10-CM | POA: Insufficient documentation

## 2017-07-23 DIAGNOSIS — E876 Hypokalemia: Secondary | ICD-10-CM | POA: Insufficient documentation

## 2017-07-23 DIAGNOSIS — D500 Iron deficiency anemia secondary to blood loss (chronic): Secondary | ICD-10-CM | POA: Insufficient documentation

## 2017-07-23 DIAGNOSIS — Z21 Asymptomatic human immunodeficiency virus [HIV] infection status: Secondary | ICD-10-CM

## 2017-07-23 DIAGNOSIS — B20 Human immunodeficiency virus [HIV] disease: Secondary | ICD-10-CM | POA: Insufficient documentation

## 2017-07-23 DIAGNOSIS — D573 Sickle-cell trait: Secondary | ICD-10-CM | POA: Insufficient documentation

## 2017-07-23 DIAGNOSIS — Z79899 Other long term (current) drug therapy: Secondary | ICD-10-CM | POA: Insufficient documentation

## 2017-07-23 DIAGNOSIS — L03012 Cellulitis of left finger: Secondary | ICD-10-CM

## 2017-07-23 DIAGNOSIS — L0291 Cutaneous abscess, unspecified: Secondary | ICD-10-CM | POA: Insufficient documentation

## 2017-07-23 DIAGNOSIS — I10 Essential (primary) hypertension: Secondary | ICD-10-CM | POA: Insufficient documentation

## 2017-07-23 MED ORDER — VALACYCLOVIR HCL 1 G PO TABS: 1000.0000 mg | ORAL_TABLET | Freq: Two times a day (BID) | ORAL | 0 refills | Status: DC

## 2017-07-23 MED ORDER — AMOXICILLIN-POT CLAVULANATE 875-125 MG PO TABS: 1.0000 | ORAL_TABLET | Freq: Two times a day (BID) | ORAL | 0 refills | Status: DC

## 2017-07-23 MED ORDER — POVIDONE-IODINE 10 % EX SOLN
CUTANEOUS | Status: AC
  Filled 2017-07-23: qty 118

## 2017-07-23 MED ORDER — SULFAMETHOXAZOLE-TRIMETHOPRIM 800-160 MG PO TABS: 1.0000 | ORAL_TABLET | Freq: Two times a day (BID) | ORAL | 0 refills | Status: AC

## 2017-07-23 MED ORDER — LIDOCAINE HCL 2 % IJ SOLN
INTRAMUSCULAR | Status: AC
  Filled 2017-07-23: qty 20

## 2017-07-23 NOTE — ED Provider Notes (Signed)
[facility]    CSN: [id number] Arrival date & time: 07/23/17  1247     History   Chief Complaint Chief Complaint  Patient presents with  . Infected Fingers    HPI Shirley White is a 47 y.o. female.   47 year old female with history of HIV, HTN, anemia, sickle cell trait comes in for 2-week history of pain and swelling to the left third and fourth finger.  States tender to palpation, denies any injury/trauma.  Denies spreading erythema, increased warmth.  Denies fever, chills, night sweats.  Has not done anything for it.  She was recently discharged from the hospital, still taking her HIV medications as directed, with CD4 count of 50, and viral load of 56,000.  She does follow-up with her ID doctor as directed.     Past Medical History:  Diagnosis Date  . Anemia   . GERD (gastroesophageal reflux disease)   . HIV disease ([place])   . Hypertension     Patient Active Problem List   Diagnosis Date Noted  . Oral thrush 07/14/2017  . Nausea vomiting and diarrhea 07/14/2017  . Hypokalemia 10/28/2016  . Routine health maintenance 08/28/2016  . Immunocompromised status associated with infection ([place]) 02/03/2016  . Esophageal candidiasis ([place]) 02/02/2016  . Nonspecific chest pain 02/02/2016  . AIDS ([place]) 02/02/2016  . Onychomycosis 12/13/2015  . Iron deficiency anemia due to chronic blood loss   . H/O menorrhagia 09/05/2014  . Symptomatic anemia 09/05/2014  . Human immunodeficiency virus I infection ([place]) 06/04/2006  . Sickle-cell trait ([place]) 06/04/2006    Past Surgical History:  Procedure Laterality Date  . CESAREAN SECTION    . ESOPHAGOGASTRODUODENOSCOPY N/A 02/03/2016   Procedure: ESOPHAGOGASTRODUODENOSCOPY (EGD);  Surgeon: Manus Gunning, MD;  Location: [facility];  Service: Gastroenterology;  Laterality: N/A;    OB History    Gravida  4   Para  4   Term  4   Preterm      AB      Living        SAB      TAB      Ectopic      Multiple      Live Births               Home Medications    Prior to Admission medications   Medication Sig Start Date End Date Taking? Authorizing Provider  bictegravir-emtricitabine-tenofovir AF (BIKTARVY) 50-200-25 MG TABS tablet Take 1 tablet by mouth daily. Try to take at the same time each day with or without food. 11/13/16  Yes [place] Callas, NP  ferrous sulfate 325 (65 FE) MG tablet Take 1 tablet (325 mg total) by mouth daily. 07/15/17 07/15/18 Yes Hall, Carole N, DO  fluconazole (DIFLUCAN) 200 MG tablet Take 1 tablet (200 mg total) by mouth daily. 07/16/17  Yes Kayleen Memos, DO  norethindrone (AYGESTIN) 5 MG tablet Take 1 tablet (5 mg total) by mouth daily. 07/15/17  Yes Hall, Carole N, DO  pantoprazole (PROTONIX) 40 MG tablet Take 1 tablet (40 mg total) by mouth daily at 12 noon. 07/15/17  Yes Hall, Carole N, DO  senna-docusate (SENOKOT-S) 8.6-50 MG tablet Take 1 tablet by mouth at bedtime as needed for mild constipation. 07/15/17  Yes Irene Pap N, DO  amoxicillin-clavulanate (AUGMENTIN) 875-125 MG tablet Take 1 tablet by mouth every 12 (twelve) hours. 07/23/17   Tasia Catchings, Austine Kelsay V, PA-C  azithromycin (ZITHROMAX) 600 MG tablet Take 2 tablets (1,200 mg total) by  mouth once a week. Patient not taking: Reported on 07/13/2017 03/08/17   Kinnie Feil, PA-C  BIKTARVY 50-200-25 MG TABS tablet TAKE 1 TABLET BY MOUTH DAILY, TO TAKE AT THE SAME TIME EACH DAY WITH OR WITHOUT FOOD Patient not taking: Reported on 07/13/2017 11/28/16   [place] Callas, NP  nystatin (MYCOSTATIN) 100000 UNIT/ML suspension Take 5 mLs (500,000 Units total) by mouth 4 (four) times daily. Patient not taking: Reported on 07/13/2017 10/21/16   Antonietta Breach, PA-C  sulfamethoxazole-trimethoprim (BACTRIM DS,SEPTRA DS) 800-160 MG tablet Take 1 tablet by mouth 2 (two) times daily for 7 days. 07/23/17 07/30/17  Ok Edwards, PA-C  valACYclovir (VALTREX) 1000 MG tablet Take 1 tablet (1,000 mg total) by mouth 2 (two) times daily for 7 days.  07/23/17 07/30/17  Ok Edwards, PA-C    Family History Family History  Problem Relation Age of Onset  . Hyperlipidemia Mother   . Hypertension Mother   . Cancer Father     Social History Social History   Tobacco Use  . Smoking status: Never Smoker  . Smokeless tobacco: Never Used  Substance Use Topics  . Alcohol use: Yes    Alcohol/week: 0.6 oz    Types: 1 Cans of beer per week    Comment: occa  . Drug use: No    Frequency: 7.0 times per week    Types: Marijuana     Allergies   Patient has no known allergies.   Review of Systems Review of Systems  Reason unable to perform ROS: See HPI as above.     Physical Exam Triage Vital Signs ED Triage Vitals [07/23/17 1342]  Enc Vitals Group     BP (!) 173/89     Pulse Rate 74     Resp 18     Temp 98.3 F (36.8 C)     Temp src      SpO2 100 %     Weight      Height      Head Circumference      Peak Flow      Pain Score      Pain Loc      Pain Edu?      Excl. in [place]?    No data found.  Updated Vital Signs BP (!) 173/89   Pulse 74   Temp 98.3 F (36.8 C)   Resp 18   LMP 07/18/2017   SpO2 100%   Physical Exam  Constitutional: She is oriented to person, place, and time. She appears well-developed and well-nourished. No distress.  HENT:  Head: Normocephalic and atraumatic.  Eyes: Pupils are equal, round, and reactive to light. Conjunctivae are normal.  Musculoskeletal:  See picture below.  Tenderness to palpation on location.  Decreased range of motion due to swelling.  Sensation intact.  Radial pulse 2+, cap refill less than 2 seconds.  Neurological: She is alert and oriented to person, place, and time.          UC Treatments / Results  Labs (all labs ordered are listed, but only abnormal results are displayed) Labs Reviewed  AEROBIC CULTURE (SUPERFICIAL SPECIMEN)  HSV CULTURE AND TYPING    EKG None Radiology No results found.  Procedures Incision and Drainage Date/Time: 07/23/2017 3:30  PM Performed by: Ok Edwards, PA-C Authorized by: Wynona Luna, MD   Consent:    Consent obtained:  Verbal   Consent given by:  Patient   Risks discussed:  Bleeding, incomplete drainage,  pain and infection   Alternatives discussed:  Referral and alternative treatment Location:    Type:  [place]   Size:  1.5cm, 0.5cm, 0.2cm   Location:  Upper extremity   Upper extremity location:  Finger   Finger location: Left long and ring finger. Pre-procedure details:    Skin preparation:  Betadine Anesthesia (see MAR for exact dosages):    Anesthesia method:  Nerve block   Block needle gauge:  27 G   Block anesthetic:  Lidocaine 2% w/o epi   Block outcome:  Anesthesia achieved Procedure type:    Complexity:  Simple Procedure details:    Incision types:  Stab incision   Incision depth:  Dermal   Scalpel blade:  11   Wound management:  Probed and deloculated   Drainage:  Purulent and serosanguinous   Drainage amount:  Moderate   Wound treatment:  Wound left open   Packing materials:  None Post-procedure details:    Patient tolerance of procedure:  Tolerated well, no immediate complications   (including critical care time)  Medications Ordered in UC Medications - No data to display   Initial Impression / Assessment and Plan / UC Course  I have reviewed the triage vital signs and the nursing notes.  Pertinent labs & imaging results that were available during my care of the patient were reviewed by me and considered in my medical decision making (see chart for details).    Case discussed with Dr. Valere Dross.  47 year old female with uncontrolled HIV, immunocompromised comes in for 2-week history of bulla/abscessto the finger.  Wound culture/HSV culture obtained.  Patient to start Augmentin, Bactrim as directed given recent hospitalization.  Start Valtrex as directed.  Patient to follow-up tomorrow for  recheck.  Return precautions given.  Patient to contact ID provider to inform of  current events.  Patient expresses understanding and agrees to plan.  Final Clinical Impressions(s) / UC Diagnoses   Final diagnoses:  Abscess    ED Discharge Orders        Ordered    amoxicillin-clavulanate (AUGMENTIN) 875-125 MG tablet  Every 12 hours     07/23/17 1522    sulfamethoxazole-trimethoprim (BACTRIM DS,SEPTRA DS) 800-160 MG tablet  2 times daily     07/23/17 1522    valACYclovir (VALTREX) 1000 MG tablet  2 times daily     07/23/17 1522        Ok Edwards, PA-C 07/23/17 1533

## 2017-07-23 NOTE — ED Triage Notes (Signed)
Pt c/o infection in her L pointer finger and middle finger. Pt states 4 days ago her fingers looked normal, now they are swollen and with large fluid filled blisters.

## 2017-07-23 NOTE — Discharge Instructions (Signed)
Abscess drained.  Start Bactrim and Augmentin as directed for bacterial infection.  Start Valtrex as directed for possible viral infection.  Wound culture obtained.  Follow-up tomorrow for further reevaluation.  Please also let your infectious disease doctor know of current events.

## 2017-07-24 ENCOUNTER — Encounter (HOSPITAL_COMMUNITY): Payer: Self-pay | Admitting: Emergency Medicine

## 2017-07-24 ENCOUNTER — Telehealth (HOSPITAL_COMMUNITY): Payer: Self-pay | Admitting: Emergency Medicine

## 2017-07-24 ENCOUNTER — Ambulatory Visit (HOSPITAL_COMMUNITY)
Admission: EM | Admit: 2017-07-24 | Discharge: 2017-07-24 | Disposition: A | Discharge status: Home / Self Care | Payer: Self-pay | Attending: Physician Assistant | Admitting: Physician Assistant

## 2017-07-24 DIAGNOSIS — L03012 Cellulitis of left finger: Secondary | ICD-10-CM

## 2017-07-24 DIAGNOSIS — Z09 Encounter for follow-up examination after completed treatment for conditions other than malignant neoplasm: Secondary | ICD-10-CM

## 2017-07-24 MED ORDER — ACYCLOVIR 400 MG PO TABS: 400.0000 mg | ORAL_TABLET | Freq: Three times a day (TID) | ORAL | 0 refills | Status: AC

## 2017-07-24 NOTE — ED Triage Notes (Signed)
Pt here for wound check on her fingers.

## 2017-07-24 NOTE — Discharge Instructions (Signed)
Start augmentin and bactrim as directed. I have switched the valtrex to acyclovir for pricing. Please start medicines today. Daily dressing of the wound. Keep area clean and dry. Please follow up with infectious disease doctor for monitoring of wound healing. Follow up for reevaluation if symptoms not improving.

## 2017-07-24 NOTE — ED Provider Notes (Signed)
Shirley White    CSN: [id number] Arrival date & time: 07/24/17  1250     History   Chief Complaint Chief Complaint  Patient presents with  . Follow-up    HPI Shirley White is a 47 y.o. female.   47 year old female with history of HIV with last CD4 count 50 comes in for follow-up after I&D yesterday. She has not been able to pick up her medications due to finances.  States pain has been improved since I&D.  She has kept the wound wrapped from yesterdays dressing.      Past Medical History:  Diagnosis Date  . Anemia   . GERD (gastroesophageal reflux disease)   . HIV disease ([place])   . Hypertension     Patient Active Problem List   Diagnosis Date Noted  . Oral thrush 07/14/2017  . Nausea vomiting and diarrhea 07/14/2017  . Hypokalemia 10/28/2016  . Routine health maintenance 08/28/2016  . Immunocompromised status associated with infection ([place]) 02/03/2016  . Esophageal candidiasis ([place]) 02/02/2016  . Nonspecific chest pain 02/02/2016  . AIDS ([place]) 02/02/2016  . Onychomycosis 12/13/2015  . Iron deficiency anemia due to chronic blood loss   . H/O menorrhagia 09/05/2014  . Symptomatic anemia 09/05/2014  . 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 Sickle-cell trait ([place]) 06/04/2006    Past Surgical History:  Procedure Laterality Date  . CESAREAN SECTION    . ESOPHAGOGASTRODUODENOSCOPY N/A 02/03/2016   Procedure: ESOPHAGOGASTRODUODENOSCOPY (EGD);  Surgeon: Manus Gunning, MD;  Location: [facility];  Service: Gastroenterology;  Laterality: N/A;    OB History    Gravida  4   Para  4   Term  4   Preterm      AB      Living        SAB      TAB      Ectopic      Multiple      Live Births               Home Medications    Prior to Admission medications   Medication Sig Start Date End Date Taking? Authorizing Provider  acyclovir (ZOVIRAX) 400 MG tablet Take 1 tablet (400 mg total) by mouth 3 (three)  times daily for 7 days. 07/24/17 07/31/17  Ok Edwards, PA-C  amoxicillin-clavulanate (AUGMENTIN) 875-125 MG tablet Take 1 tablet by mouth every 12 (twelve) hours. 07/23/17   Tasia Catchings, Malissie Musgrave V, PA-C  azithromycin (ZITHROMAX) 600 MG tablet Take 2 tablets (1,200 mg total) by mouth once a week. Patient not taking: Reported on 07/13/2017 03/08/17   Kinnie Feil, PA-C  bictegravir-emtricitabine-tenofovir AF (BIKTARVY) 50-200-25 MG TABS tablet Take 1 tablet by mouth daily. Try to take at the same time each day with or without food. 11/13/16   Dixon, Melton Krebs, NP  BIKTARVY 50-200-25 MG TABS tablet TAKE 1 TABLET BY MOUTH DAILY, TO TAKE AT THE SAME TIME EACH DAY WITH OR WITHOUT FOOD Patient not taking: Reported on 07/13/2017 11/28/16   [place] Callas, NP  ferrous sulfate 325 (65 FE) MG tablet Take 1 tablet (325 mg total) by mouth daily. 07/15/17 07/15/18  Kayleen Memos, DO  fluconazole (DIFLUCAN) 200 MG tablet Take 1 tablet (200 mg total) by mouth daily. 07/16/17   Kayleen Memos, DO  norethindrone (AYGESTIN) 5 MG tablet Take 1 tablet (5 mg total) by mouth daily. 07/15/17   Kayleen Memos, DO  nystatin (MYCOSTATIN) 100000 UNIT/ML suspension Take  5 mLs (500,000 Units total) by mouth 4 (four) times daily. Patient not taking: Reported on 07/13/2017 10/21/16   Antonietta Breach, PA-C  pantoprazole (PROTONIX) 40 MG tablet Take 1 tablet (40 mg total) by mouth daily at 12 noon. 07/15/17   Kayleen Memos, DO  senna-docusate (SENOKOT-S) 8.6-50 MG tablet Take 1 tablet by mouth at bedtime as needed for mild constipation. 07/15/17   Kayleen Memos, DO  sulfamethoxazole-trimethoprim (BACTRIM DS,SEPTRA DS) 800-160 MG tablet Take 1 tablet by mouth 2 (two) times daily for 7 days. 07/23/17 07/30/17  Ok Edwards, PA-C    Family History Family History  Problem Relation Age of Onset  . Hyperlipidemia Mother   . Hypertension Mother   . Cancer Father     Social History Social History   Tobacco Use  . Smoking status: Never Smoker  . Smokeless  tobacco: Never Used  Substance Use Topics  . Alcohol use: Yes    Alcohol/week: 0.6 oz    Types: 1 Cans of beer per week    Comment: occa  . Drug use: No    Frequency: 7.0 times per week    Types: Marijuana     Allergies   Patient has no known allergies.   Review of Systems Review of Systems  Reason unable to perform ROS: See HPI as above.     Physical Exam Triage Vital Signs ED Triage Vitals  Enc Vitals Group     BP 07/24/17 1348 (!) 145/71     Pulse Rate 07/24/17 1348 78     Resp 07/24/17 1348 18     Temp 07/24/17 1348 98.2 F (36.8 C)     Temp Source 07/24/17 1348 Oral     SpO2 07/24/17 1348 100 %     Weight --      Height --      Head Circumference --      Peak Flow --      Pain Score 07/24/17 1352 0     Pain Loc --      Pain Edu? --      Excl. in [place]? --    No data found.  Updated Vital Signs BP (!) 145/71 (BP Location: Left Arm)   Pulse 78   Temp 98.2 F (36.8 C) (Oral)   Resp 18   LMP 07/18/2017   SpO2 100%   Physical Exam  Constitutional: She is oriented to person, place, and time. She appears well-developed and well-nourished. No distress.  HENT:  Head: Normocephalic and atraumatic.  Eyes: Pupils are equal, round, and reactive to light. Conjunctivae are normal.  Musculoskeletal:  See picture below. Continued draining of the middle finger incision site that is serosanguinous. No purulent drainage. Ring finger incision site clean and dry, no drainage appreciated. No surrounding erythema, increased warmth. Mild tenderness around area, much improved compared to yesterday.   Neurological: She is alert and oriented to person, place, and time.       UC Treatments / Results  Labs (all labs ordered are listed, but only abnormal results are displayed) Labs Reviewed - No data to display  EKG None Radiology No results found.  Procedures Procedures (including critical care time)  Medications Ordered in UC Medications - No data to  display   Initial Impression / Assessment and Plan / UC Course  I have reviewed the triage vital signs and the nursing notes.  Pertinent labs & imaging results that were available during my care of the patient were reviewed by  me and considered in my medical decision making (see chart for details).    Will switch valtrex to acyclovir. Coupons provided. Patient to fill medicines and start today. Wound culture still pending. Patient to follow up with ID provider for further monitoring needed. Return precautions given. Patient expresses understanding and agrees to plan.   Final Clinical Impressions(s) / UC Diagnoses   Final diagnoses:  Follow-up exam    ED Discharge Orders        Ordered    acyclovir (ZOVIRAX) 400 MG tablet  3 times daily     07/24/17 1423        Ok Edwards, [place] 07/24/17 1433

## 2017-07-26 LAB — AEROBIC CULTURE  (SUPERFICIAL SPECIMEN): CULTURE: NO GROWTH

## 2017-07-26 LAB — HSV CULTURE AND TYPING

## 2017-07-26 LAB — AEROBIC CULTURE W GRAM STAIN (SUPERFICIAL SPECIMEN)

## 2017-07-27 NOTE — Telephone Encounter (Signed)
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 Educated on Herpes and safe sex practices. Pt denies any concerns and verbalized understanding.

## 2017-07-28 ENCOUNTER — Encounter: Payer: Self-pay | Admitting: Obstetrics and Gynecology

## 2017-07-29 ENCOUNTER — Telehealth: Payer: Self-pay | Admitting: *Deleted

## 2017-07-29 NOTE — Telephone Encounter (Signed)
Contacted the patient and I was able to reach her today. She stated she has been doing well but admitted to being seen recently in the ED and Urgent Care. I offered Norah assistance with getting her medications refilled and she advised me that her ADAP coverage has expired so she is not sure how much the medications will cost. Annaliah also stated she has plans to be present for her appt on Monday with plans of renewing her ADAP at that time. She stated she has been trying to find my number and appreciated my call today. We made plans to work on getting her Antibiotics and Valtrex tomorrow with a planned home visit. Michala stated she has a new job and a new home so we have a lot to catch up on. Looking forward to our visit tomorrrow

## 2017-07-30 ENCOUNTER — Telehealth: Payer: Self-pay | Admitting: *Deleted

## 2017-07-30 ENCOUNTER — Ambulatory Visit: Payer: Self-pay | Admitting: *Deleted

## 2017-07-30 DIAGNOSIS — B20 Human immunodeficiency virus [HIV] disease: Secondary | ICD-10-CM

## 2017-07-30 DIAGNOSIS — B3781 Candidal esophagitis: Secondary | ICD-10-CM

## 2017-08-03 ENCOUNTER — Ambulatory Visit: Payer: Self-pay

## 2017-08-03 NOTE — Telephone Encounter (Signed)
VO received from Dr Linus Salmons approving this 1d1 effective 07/30/17 to assess the needs of the patient and to request additional orders if needed. New orders will be requested since patient has identified needs.  I have been able to reach and provide a home visit with Shirley White today. She has a new phone number and a new address, system up to date. She admits to being off her medications and HMAP has expired. She has a planned visit with pharmacy on 08/03/17 to discuss her regimen and recertify for HMAP. She currently has an infection so I conference with ID pharmacy to see which medications are a must have since Shirley White is unable to cover the cost of her medications at this time. We used a copay assistance fund to cover the cost of her medications to manage her current infection. Medications(Bactrim and Acyclovir) are delivered to her at this time along with a bus pass to ensure she has transportation for her visit on Monday(04/22). Shirley White states she does not have any of her medications in the home.    Initial Assessment Points to Consider for Care  Points are not all inclusive to services and educated provided but supports the patient's Individualized Plan of Care.  . Is Home safe for visits? Yes   Are all firearms or weapons secure? Yes . Insurance Coverage: RW (currently expired with visit planned for 08/03/17) . 1st HIV Diagnosis: 05/21/2006 . Mode of HIV Transmission: thought to be Heterosexual contact . Functional Status: none noted . Current Housing/Needs: New apartment . Social Support/System:family is involved and close by . Culture/Religion/Spirituality: will not affect anticipated Plan of Care . Educational Background: high school . Legal Issues: none noted . Access/Utilization of [employer]: uses public transportation . Mental Health Concerns/Diagnosis: N/A  . Alcohol and/or Drug Use: N/A . Risk and Knowledge of HIV and Reduction in Transmission:  To be assessed and education  to be provided as needed  . Nutritional Needs: none noted  Frequency / Duration of [facility] visits: Effective 07/30/17: 69mo3 75mo1, 4 PRN's for complications with disease process/progression, medication changes or concerns   [facility] will assess for learning needs related to diagnosis and treatment regimen, provide education as needed, fill pill box if needed, address any barriers which may be preventing medication compliance, and communicating with care team including physician and case manager.   Individualized [place] Period from 07/30/17  to 10/28/17  a. Type of service(s) and care to be delivered: RN Case Management  b. Frequency and duration of service: Effective 07/30/17: 69mo3, 18mo1, 4 prns for complications with disease process/progression, medication changes or concerns .   Visits/Contact may be conducted telephonically or in person to best suit the patient. Patient works at [name] during the day.  c. Activity restrictions: Pt may be up as tolerated and can safely ambulate without the need for a assistive device   d. Safety Measures: Standard Precautions/Infection Control   e. Service Objectives and Goals: Service Objectives are to assist the pt with HIV medication regimen adherence and staying in care with the Infectious Disease Clinic by identifying barriers to care. RN  will address the barriers that are identified by the patient. Patient current states she is sick and would like to get her medications but her HMAP has expired and she is completely out of her medications  and unable to afford the cost without assistance.  f. Equipment required: No additional equipment needs at this time   g. Functional Limitations: None Noted  h. Rehabilitation potential: Guarded   i. Diet and Nutritional Needs: Regular Diet   j. Medications and treatments: Medications have been reconciled and reviewed and are a part of EPIC electronic file   k. Specific therapies if needed: RN   l.  Pertinent diagnoses: HIV disease,  Hx of medication NonCompliance, Herpes outbreak to finger, Oral candidiasis   m. Expected outcome: Guarded

## 2017-08-03 NOTE — Telephone Encounter (Signed)
I approve of this POC as outlined without changes.

## 2017-08-05 ENCOUNTER — Ambulatory Visit (INDEPENDENT_AMBULATORY_CARE_PROVIDER_SITE_OTHER): Payer: Self-pay | Admitting: Pharmacist

## 2017-08-05 DIAGNOSIS — Z9119 Patient's noncompliance with other medical treatment and regimen: Secondary | ICD-10-CM

## 2017-08-05 DIAGNOSIS — Z7189 Other specified counseling: Secondary | ICD-10-CM

## 2017-08-05 DIAGNOSIS — Z91199 Patient's noncompliance with other medical treatment and regimen due to unspecified reason: Secondary | ICD-10-CM

## 2017-08-05 DIAGNOSIS — B20 Human immunodeficiency virus [HIV] disease: Secondary | ICD-10-CM

## 2017-08-05 NOTE — Progress Notes (Signed)
HPI: Shirley White is a 47 y.o. female presents to [place] clinic to follow up for HIV disease after multiple no-shows.  Allergies: No Known Allergies  Vitals:    Past Medical History: Past Medical History:  Diagnosis Date  . Anemia   . GERD (gastroesophageal reflux disease)   . HIV disease ([place])   . Hypertension     Social History: Social History   Socioeconomic History  . Marital status: Single    Spouse name: Not on file  . Number of children: Not on file  . Years of education: Not on file  . Highest education level: Not on file  Occupational History  . Not on file  Social Needs  . Financial resource strain: Not on file  . Food insecurity:    Worry: Not on file    Inability: Not on file  . Transportation needs:    Medical: Not on file    Non-medical: Not on file  Tobacco Use  . Smoking status: Never Smoker  . Smokeless tobacco: Never Used  Substance and Sexual Activity  . Alcohol use: Yes    Alcohol/week: 0.6 oz    Types: 1 Cans of beer per week    Comment: occa  . Drug use: No    Frequency: 7.0 times per week    Types: Marijuana  . Sexual activity: Yes    Partners: Male    Birth control/protection: Condom    Comment: given condoms  Lifestyle  . Physical activity:    Days per week: Not on file    Minutes per session: Not on file  . Stress: Not on file  Relationships  . Social connections:    Talks on phone: Not on file    Gets together: Not on file    Attends religious service: Not on file    Active member of club or organization: Not on file    Attends meetings of clubs or organizations: Not on file    Relationship status: Not on file  Other Topics Concern  . Not on file  Social History Narrative  . Not on file    Previous Regimen: [place]  Current Regimen: Biktarvy  Labs: HIV 1 RNA Quant (copies/mL)  Date Value  07/14/2017 56,800  12/16/2016 5,880 (H)  12/07/2015 20,410 (H)   CD4 T Cell Abs (/uL)  Date Value   07/14/2017 50 (L)  12/16/2016 40 (L)  11/13/2016 50 (L)   Hep B S Ab (no units)  Date Value  09/05/2014 NEG   Hepatitis B Surface Ag (no units)  Date Value  09/05/2014 NEGATIVE   HCV Ab (no units)  Date Value  09/05/2014 NEGATIVE    CrCl: CrCl cannot be calculated (Patient's most recent lab result is older than the maximum 21 days allowed.).  Lipids:    Component Value Date/Time   CHOL 143 08/21/2015 1132   TRIG 62 08/21/2015 1132   HDL 61 08/21/2015 1132   CHOLHDL 2.3 08/21/2015 1132   VLDL 12 08/21/2015 1132   LDLCALC 70 08/21/2015 1132    Assessment: Shirley White is here to follow up for HIV disease after multiple no-shows. She has a long history of noncompliance and uncontrolled disease. Her ADAP is expired and will take several weeks to be re-approved. She is supposed to be on Biktarvy but admitted she still has a full bottle at home and has not been taking it - unclear why. She was also hospitalized earlier this month for anemia, and later seen in  the ED for thrush - she states this is doing much better. She got HIV labs while admitted and these showed very uncontrolled HIV with CD4 of 50. Despite this she say she is feeling great. Museum/gallery conservator (home health nurse) delivered fluconazole, Bactrim and acyclovir to her home last week and Alandra reports she has been taking these. She was counseled on the importance of Biktarvy for treating HIV and eliminating the need for OI prophylaxis. She agreed to start taking Biktarvy.  Recommendations: Continue Biktarvy 1 PO daily Continue Bactrim 1 DS po daily + fluconazole 200 mg daily F/u 5/29 @ 1:45 w/ Georgeann Oppenheim, PharmD PGY1 Pharmacy Resident [facility] 08/05/17 12:22 PM

## 2017-08-14 NOTE — Progress Notes (Signed)
Non visit assistance provided at this time working on medications.

## 2017-09-09 ENCOUNTER — Ambulatory Visit: Payer: Self-pay | Admitting: Infectious Diseases

## 2017-09-14 ENCOUNTER — Encounter: Payer: Self-pay | Admitting: Obstetrics and Gynecology

## 2017-09-29 ENCOUNTER — Encounter (HOSPITAL_COMMUNITY): Payer: Self-pay | Admitting: Emergency Medicine

## 2017-09-29 ENCOUNTER — Ambulatory Visit (HOSPITAL_COMMUNITY)
Admission: EM | Admit: 2017-09-29 | Discharge: 2017-09-29 | Disposition: A | Discharge status: Home / Self Care | Payer: Self-pay | Attending: Internal Medicine | Admitting: Internal Medicine

## 2017-09-29 DIAGNOSIS — Z79899 Other long term (current) drug therapy: Secondary | ICD-10-CM | POA: Insufficient documentation

## 2017-09-29 DIAGNOSIS — Z8249 Family history of ischemic heart disease and other diseases of the circulatory system: Secondary | ICD-10-CM | POA: Insufficient documentation

## 2017-09-29 DIAGNOSIS — B20 Human immunodeficiency virus [HIV] disease: Secondary | ICD-10-CM | POA: Insufficient documentation

## 2017-09-29 DIAGNOSIS — K219 Gastro-esophageal reflux disease without esophagitis: Secondary | ICD-10-CM | POA: Insufficient documentation

## 2017-09-29 DIAGNOSIS — D573 Sickle-cell trait: Secondary | ICD-10-CM | POA: Insufficient documentation

## 2017-09-29 DIAGNOSIS — I10 Essential (primary) hypertension: Secondary | ICD-10-CM | POA: Insufficient documentation

## 2017-09-29 DIAGNOSIS — R21 Rash and other nonspecific skin eruption: Secondary | ICD-10-CM

## 2017-09-29 MED ORDER — VALACYCLOVIR HCL 1 G PO TABS: 1000.0000 mg | ORAL_TABLET | Freq: Three times a day (TID) | ORAL | 0 refills | Status: DC

## 2017-09-29 MED ORDER — HYDROXYZINE HCL 25 MG PO TABS: 25.0000 mg | ORAL_TABLET | Freq: Four times a day (QID) | ORAL | 0 refills | Status: DC | PRN

## 2017-09-29 MED ORDER — VALACYCLOVIR HCL 1 G PO TABS: 1000.0000 mg | ORAL_TABLET | Freq: Three times a day (TID) | ORAL | 0 refills | Status: AC

## 2017-09-29 NOTE — ED Provider Notes (Signed)
Shirley White    CSN: [id number] Arrival date & time: 09/29/17  1330     History   Chief Complaint Chief Complaint  Patient presents with  . 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   Eydie presents with complaints of itching lesions to her arms and face which started suddenly 1 week ago. No pain. Sometimes with drainage. States she has been itching them quite aggressively at times. No fevers. Denies previous similar. Has not worsened but has not improved. Denies any other systemic symptoms. Has been applying vaseline. Hx of HIV, does not follow regularly with ID, per notes has missed multiple appointments in the past. States today she has been taking her medication daily. 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    ROS per HPI.      Past Medical History:  Diagnosis Date  . Anemia   . GERD (gastroesophageal reflux disease)   . HIV disease ([place])   . Hypertension     Patient Active Problem List   Diagnosis Date Noted  . Oral thrush 07/14/2017  . Nausea vomiting and diarrhea 07/14/2017  . Hypokalemia 10/28/2016  . Routine health maintenance 08/28/2016  . Immunocompromised status associated with infection ([place]) 02/03/2016  . Esophageal candidiasis ([place]) 02/02/2016  . Nonspecific chest pain 02/02/2016  . AIDS ([place]) 02/02/2016  . Onychomycosis 12/13/2015  . Iron deficiency anemia due to chronic blood loss   . H/O menorrhagia 09/05/2014  . Symptomatic anemia 09/05/2014  . Human immunodeficiency virus I infection ([place]) 06/04/2006  . Sickle-cell trait ([place]) 06/04/2006    Past Surgical History:  Procedure Laterality Date  . CESAREAN SECTION    . ESOPHAGOGASTRODUODENOSCOPY N/A 02/03/2016   Procedure: ESOPHAGOGASTRODUODENOSCOPY (EGD);  Surgeon: Manus Gunning, MD;  Location: [facility];  Service: Gastroenterology;  Laterality: N/A;    OB History    Gravida  4   Para  4   Term  4   Preterm      AB      Living        SAB      TAB      Ectopic      Multiple      Live Births               Home Medications    Prior to Admission medications   Medication Sig Start Date End Date Taking? Authorizing Provider  BIKTARVY 50-200-25 MG TABS tablet TAKE 1 TABLET BY MOUTH DAILY, TO TAKE AT THE SAME TIME EACH DAY WITH OR WITHOUT FOOD 11/28/16  Yes [place] Callas, NP  fluconazole (DIFLUCAN) 200 MG tablet Take 1 tablet (200 mg total) by mouth daily. 07/16/17  Yes Kayleen Memos, DO  norethindrone (AYGESTIN) 5 MG tablet Take 1 tablet (5 mg total) by mouth daily. 07/15/17  Yes Hall, Carole N, DO  senna-docusate (SENOKOT-S) 8.6-50 MG tablet Take 1 tablet by mouth at bedtime as needed for mild constipation. 07/15/17  Yes Kayleen Memos, DO  hydrOXYzine (ATARAX/VISTARIL) 25 MG tablet Take 1 tablet (25 mg total) by mouth every 6 (six) hours as needed for itching. 09/29/17   Zigmund Gottron, NP  valACYclovir (VALTREX) 1000 MG tablet Take 1 tablet (1,000 mg total) by mouth 3 (three) times daily for 10 days. 09/29/17 10/09/17  Zigmund Gottron, NP    Family History Family History  Problem Relation Age of Onset  . Hyperlipidemia Mother   . Hypertension  Mother   . Cancer Father     Social History Social History   Tobacco Use  . Smoking status: Never Smoker  . Smokeless tobacco: Never Used  Substance Use Topics  . Alcohol use: Yes    Alcohol/week: 0.6 oz    Types: 1 Cans of beer per week    Comment: occa  . Drug use: No    Frequency: 7.0 times per week    Types: Marijuana     Allergies   Patient has no known allergies.   Review of Systems Review of Systems   Physical Exam Triage Vital Signs ED Triage Vitals  Enc Vitals Group     BP 09/29/17 1348 119/74     Pulse Rate 09/29/17 1348 97     Resp 09/29/17 1348 16     Temp 09/29/17 1348 99.1 F (37.3 C)     Temp Source 09/29/17 1348 Oral     SpO2 09/29/17 1348 100 %     Weight 09/29/17 1346 100 lb (45.4 kg)     Height --       Head Circumference --      Peak Flow --      Pain Score 09/29/17 1346 0     Pain Loc --      Pain Edu? --      Excl. in [place]? --    No data found.  Updated Vital Signs BP 119/74   Pulse 97   Temp 99.1 F (37.3 C) (Oral)   Resp 16   Wt 100 lb (45.4 kg)   LMP 09/12/2017   SpO2 100%   BMI 21.64 kg/m    Physical Exam  Constitutional: She is oriented to person, place, and time. She appears well-developed and well-nourished. No distress.  Cardiovascular: Normal rate, regular rhythm and normal heart sounds.  Pulmonary/Chest: Effort normal and breath sounds normal.  Neurological: She is alert and oriented to person, place, and time.  Skin: Skin is warm and dry.  See photos of ulcerated lesions to bilateral arms, under jaw bilaterally and to upper lip; culture obtained from left jaw line open area; non tender                UC Treatments / Results  Labs (all labs ordered are listed, but only abnormal results are displayed) Labs Reviewed  HSV CULTURE AND TYPING    EKG None  Radiology No results found.  Procedures Procedures (including critical care time)  Medications Ordered in UC Medications - No data to display  Initial Impression / Assessment and Plan / UC Course  I have reviewed the triage vital signs and the nursing notes.  Pertinent labs & imaging results that were available during my care of the patient were reviewed by me and considered in my medical decision making (see chart for details).     Afebrile. Otherwise non toxic in appearance. Call made to on-call physician Dr. Baxter Flattery with ID who was able to view photos. Agreeable to started course of valtrex, hsv culture obtained. Patient encouraged and emphasized significance of following up with ID. Patient verbalized understanding and agreeable to plan.    Final Clinical Impressions(s) / UC Diagnoses   Final diagnoses:  Rash and nonspecific skin eruption     Discharge Instructions     Please  take valtrex three times a day. May use vistaril as needed for itching. Please take your daily Biktarvy as prescribed. Please follow up with infectious disease, you may walk into their clinic,  for recheck and further management.     ED Prescriptions    Medication Sig Dispense Auth. Provider   valACYclovir (VALTREX) 1000 MG tablet  (Status: Discontinued) Take 1 tablet (1,000 mg total) by mouth 3 (three) times daily for 10 days. 30 tablet Augusto Gamble B, NP   hydrOXYzine (ATARAX/VISTARIL) 25 MG tablet  (Status: Discontinued) Take 1 tablet (25 mg total) by mouth every 6 (six) hours as needed for itching. 12 tablet Augusto Gamble B, NP   hydrOXYzine (ATARAX/VISTARIL) 25 MG tablet Take 1 tablet (25 mg total) by mouth every 6 (six) hours as needed for itching. 12 tablet Augusto Gamble B, NP   valACYclovir (VALTREX) 1000 MG tablet Take 1 tablet (1,000 mg total) by mouth 3 (three) times daily for 10 days. 30 tablet Zigmund Gottron, NP     Controlled Substance Prescriptions  Controlled Substance Registry consulted? Not Applicable   Zigmund Gottron, NP 09/29/17 1526

## 2017-09-29 NOTE — Discharge Instructions (Signed)
Please take valtrex three times a day. May use vistaril as needed for itching. Please take your daily Biktarvy as prescribed. Please follow up with infectious disease, you may walk into their clinic, for recheck and further management.

## 2017-09-29 NOTE — ED Triage Notes (Signed)
PT has itching open sores on arms, neck, and face for 1 week.

## 2017-10-01 LAB — HSV CULTURE AND TYPING

## 2017-10-16 ENCOUNTER — Ambulatory Visit: Payer: Self-pay | Admitting: Infectious Diseases

## 2017-10-26 ENCOUNTER — Emergency Department (HOSPITAL_COMMUNITY): Payer: Self-pay

## 2017-10-26 ENCOUNTER — Other Ambulatory Visit: Payer: Self-pay

## 2017-10-26 ENCOUNTER — Emergency Department (HOSPITAL_COMMUNITY)
Admission: EM | Admit: 2017-10-26 | Discharge: 2017-10-26 | Disposition: A | Discharge status: Home / Self Care | Payer: Self-pay | Attending: Emergency Medicine | Admitting: Emergency Medicine

## 2017-10-26 ENCOUNTER — Encounter (HOSPITAL_COMMUNITY): Payer: Self-pay | Admitting: *Deleted

## 2017-10-26 DIAGNOSIS — I10 Essential (primary) hypertension: Secondary | ICD-10-CM | POA: Insufficient documentation

## 2017-10-26 DIAGNOSIS — L0291 Cutaneous abscess, unspecified: Secondary | ICD-10-CM

## 2017-10-26 DIAGNOSIS — L0211 Cutaneous abscess of neck: Secondary | ICD-10-CM | POA: Insufficient documentation

## 2017-10-26 DIAGNOSIS — Z79899 Other long term (current) drug therapy: Secondary | ICD-10-CM | POA: Insufficient documentation

## 2017-10-26 DIAGNOSIS — B999 Unspecified infectious disease: Secondary | ICD-10-CM

## 2017-10-26 DIAGNOSIS — D8481 Immunodeficiency due to conditions classified elsewhere: Secondary | ICD-10-CM

## 2017-10-26 LAB — CBC WITH DIFFERENTIAL/PLATELET
BASOS PCT: 1 %
Basophils Absolute: 0 10*3/uL (ref 0.0–0.1)
Eosinophils Absolute: 0.2 10*3/uL (ref 0.0–0.7)
Eosinophils Relative: 5 %
HEMATOCRIT: 36.4 % (ref 36.0–46.0)
HEMOGLOBIN: 11.5 g/dL — AB (ref 12.0–15.0)
LYMPHS ABS: 1.6 10*3/uL (ref 0.7–4.0)
LYMPHS PCT: 54 %
MCH: 27.1 pg (ref 26.0–34.0)
MCHC: 31.6 g/dL (ref 30.0–36.0)
MCV: 85.8 fL (ref 78.0–100.0)
MONOS PCT: 7 %
Monocytes Absolute: 0.2 10*3/uL (ref 0.1–1.0)
NEUTROS ABS: 1 10*3/uL — AB (ref 1.7–7.7)
Neutrophils Relative %: 33 %
Platelets: 31 10*3/uL — ABNORMAL LOW (ref 150–400)
RBC: 4.24 MIL/uL (ref 3.87–5.11)
RDW: 14 % (ref 11.5–15.5)
WBC: 3 10*3/uL — ABNORMAL LOW (ref 4.0–10.5)

## 2017-10-26 LAB — I-STAT CHEM 8, ED
BUN: 6 mg/dL (ref 6–20)
CREATININE: 0.3 mg/dL — AB (ref 0.44–1.00)
Calcium, Ion: 1.04 mmol/L — ABNORMAL LOW (ref 1.15–1.40)
Chloride: 107 mmol/L (ref 98–111)
GLUCOSE: 76 mg/dL (ref 70–99)
HEMATOCRIT: 37 % (ref 36.0–46.0)
Hemoglobin: 12.6 g/dL (ref 12.0–15.0)
POTASSIUM: 3.8 mmol/L (ref 3.5–5.1)
Sodium: 139 mmol/L (ref 135–145)
TCO2: 25 mmol/L (ref 22–32)

## 2017-10-26 MED ORDER — CEPHALEXIN 500 MG PO CAPS: 500.0000 mg | ORAL_CAPSULE | Freq: Four times a day (QID) | ORAL | 0 refills | Status: AC

## 2017-10-26 MED ORDER — LIDOCAINE HCL (PF) 1 % IJ SOLN
INTRAMUSCULAR | Status: AC
  Administered 2017-10-26: 22:00:00
  Filled 2017-10-26: qty 5

## 2017-10-26 MED ORDER — IOHEXOL 300 MG/ML  SOLN
100.0000 mL | Freq: Once | INTRAMUSCULAR | Status: AC | PRN
  Administered 2017-10-26: 100 mL via INTRAVENOUS

## 2017-10-26 MED ORDER — DOXYCYCLINE HYCLATE 100 MG PO CAPS: 100.0000 mg | ORAL_CAPSULE | Freq: Two times a day (BID) | ORAL | 0 refills | Status: AC

## 2017-10-26 NOTE — ED Triage Notes (Signed)
Pt in with abscess to the side of her neck for the last few weeks, increased in size in the last few days and started draining

## 2017-10-26 NOTE — ED Notes (Signed)
Patient ambulatory to bathroom with steady gait at this time 

## 2017-10-26 NOTE — ED Notes (Signed)
Pt understood dc material. NAD noted. Script given at dc  

## 2017-10-26 NOTE — ED Provider Notes (Signed)
[facility] EMERGENCY DEPARTMENT Provider Note   CSN: [id number] Arrival date & time: 10/26/17  1041     History   Chief Complaint Chief Complaint  Patient presents with  . Abscess    HPI Shirley White is a 47 y.o. female with history of HIV/AIDS presenting with abscess to her left neck.  Patient states that approximately week ago she noticed a bump develop that has gradually increased in size and began draining bloody yellow fluid yesterday.  Patient has had history of abscesses in the past.  Patient states that her abscess is moderately painful, sharp worse with palpation.   Patient's last CD4 count in April was 50.  I have reviewed the patient's last office encounter for her HIV in April, note shows that patient is poorly controlled, does not take her medications.  HPI  Past Medical History:  Diagnosis Date  . Anemia   . GERD (gastroesophageal reflux disease)   . HIV disease ([place])   . Hypertension     Patient Active Problem List   Diagnosis Date Noted  . Oral thrush 07/14/2017  . Nausea vomiting and diarrhea 07/14/2017  . Hypokalemia 10/28/2016  . Routine health maintenance 08/28/2016  . Immunocompromised status associated with infection ([place]) 02/03/2016  . Esophageal candidiasis ([place]) 02/02/2016  . Nonspecific chest pain 02/02/2016  . AIDS ([place]) 02/02/2016  . Onychomycosis 12/13/2015  . Iron deficiency anemia due to chronic blood loss   . H/O menorrhagia 09/05/2014  . Symptomatic anemia 09/05/2014  . Human immunodeficiency virus I infection ([place]) 06/04/2006  . Sickle-cell trait ([place]) 06/04/2006    Past Surgical History:  Procedure Laterality Date  . CESAREAN SECTION    . ESOPHAGOGASTRODUODENOSCOPY N/A 02/03/2016   Procedure: ESOPHAGOGASTRODUODENOSCOPY (EGD);  Surgeon: Manus Gunning, MD;  Location: [facility];  Service: Gastroenterology;  Laterality: N/A;     OB History    Gravida  4   Para  4   Term  4   Preterm        AB      Living        SAB      TAB      Ectopic      Multiple      Live Births               Home Medications    Prior to Admission medications   Medication Sig Start Date End Date Taking? Authorizing Provider  BIKTARVY 50-200-25 MG TABS tablet TAKE 1 TABLET BY MOUTH DAILY, TO TAKE AT THE SAME TIME EACH DAY WITH OR WITHOUT FOOD 11/28/16   [place] Callas, NP  cephALEXin (KEFLEX) 500 MG capsule Take 1 capsule (500 mg total) by mouth 4 (four) times daily for 7 days. 10/26/17 11/02/17  Nuala Alpha A, PA-C  doxycycline (VIBRAMYCIN) 100 MG capsule Take 1 capsule (100 mg total) by mouth 2 (two) times daily for 7 days. 10/26/17 11/02/17  Nuala Alpha A, PA-C  fluconazole (DIFLUCAN) 200 MG tablet Take 1 tablet (200 mg total) by mouth daily. 07/16/17   Kayleen Memos, DO  hydrOXYzine (ATARAX/VISTARIL) 25 MG tablet Take 1 tablet (25 mg total) by mouth every 6 (six) hours as needed for itching. 09/29/17   Zigmund Gottron, NP  norethindrone (AYGESTIN) 5 MG tablet Take 1 tablet (5 mg total) by mouth daily. 07/15/17   Kayleen Memos, DO  senna-docusate (SENOKOT-S) 8.6-50 MG tablet Take 1 tablet by mouth at bedtime as needed for mild constipation.  07/15/17   Kayleen Memos, DO    Family History Family History  Problem Relation Age of Onset  . Hyperlipidemia Mother   . Hypertension Mother   . Cancer Father     Social History Social History   Tobacco Use  . Smoking status: Never Smoker  . Smokeless tobacco: Never Used  Substance Use Topics  . Alcohol use: Yes    Alcohol/week: 0.6 oz    Types: 1 Cans of beer per week    Comment: occa  . Drug use: No    Frequency: 7.0 times per week    Types: Marijuana     Allergies   Patient has no known allergies.   Review of Systems Review of Systems  Constitutional: Negative.  Negative for chills, fatigue and fever.  HENT: Negative.  Negative for sore throat, trouble swallowing and voice change.   Eyes: Negative.    Respiratory: Negative.  Negative for cough and shortness of breath.   Cardiovascular: Negative.  Negative for chest pain.  Gastrointestinal: Negative.  Negative for abdominal pain, diarrhea, nausea and vomiting.  Genitourinary: Negative.  Negative for dysuria, flank pain and hematuria.  Musculoskeletal: Positive for neck pain. Negative for arthralgias, myalgias and neck stiffness.  Skin: Positive for wound.  Neurological: Negative.  Negative for dizziness and headaches.     Physical Exam Updated Vital Signs BP (!) 151/94   Pulse 88   Temp 98.1 F (36.7 C) (Oral)   Resp 20   SpO2 99%   Physical Exam  Constitutional: She is oriented to person, place, and time. She appears well-developed and well-nourished. No distress.  HENT:  Head: Normocephalic and atraumatic.  Right Ear: External ear normal.  Left Ear: External ear normal.  Nose: Nose normal.  Eyes: Pupils are equal, round, and reactive to light. EOM are normal.  Neck: Trachea normal, normal range of motion, full passive range of motion without pain and phonation normal. Neck supple. No tracheal tenderness present. No tracheal deviation present.    Patient with 2 cm diameter abscess that protrudes approximately 2 cm up from the skin.  There is a scab at the apex where likely has been recently draining.  No surrounding erythema or streaking, no swelling.  This seems like a discrete superficial abscess, will obtain CT to confirm.  Cardiovascular: Normal rate, regular rhythm and normal heart sounds.  Pulmonary/Chest: Effort normal and breath sounds normal. No respiratory distress.  Abdominal: Soft. There is no tenderness. There is no rebound and no guarding.  Musculoskeletal: Normal range of motion. She exhibits no tenderness.  Neurological: She is alert and oriented to person, place, and time.  Skin: Skin is warm and dry. Capillary refill takes less than 2 seconds.  Patient with multiple scars noted over her entire body from  previous abscesses and skin injury.  Psychiatric: She has a normal mood and affect. Her behavior is normal.     ED Treatments / Results  Labs (all labs ordered are listed, but only abnormal results are displayed) Labs Reviewed  CBC WITH DIFFERENTIAL/PLATELET - Abnormal; Notable for the following components:      Result Value   WBC 3.0 (*)    Hemoglobin 11.5 (*)    Platelets 31 (*)    Neutro Abs 1.0 (*)    All other components within normal limits  I-STAT CHEM 8, ED - Abnormal; Notable for the following components:   Creatinine, Ser 0.30 (*)    Calcium, Ion 1.04 (*)    All other  components within normal limits  AEROBIC CULTURE (SUPERFICIAL SPECIMEN)    EKG None  Radiology Ct Soft Tissue Neck W Contrast  Result Date: 10/26/2017 CLINICAL DATA:  Initial evaluation for left-sided neck abscess for several days. EXAM: CT NECK WITH CONTRAST TECHNIQUE: Multidetector CT imaging of the neck was performed using the standard protocol following the bolus administration of intravenous contrast. CONTRAST:  114mL OMNIPAQUE IOHEXOL 300 MG/ML  SOLN COMPARISON:  None available. FINDINGS: Pharynx and larynx: Oral cavity within normal limits. No acute abnormality about the dentition. Palatine tonsils symmetric and within normal limits. No base of tongue lesion. Nasopharynx and oropharynx within normal limits. No retropharyngeal collection. Epiglottis within normal limits. Vallecula clear. Remainder of the hypopharynx and supraglottic larynx within normal limits. True cords symmetric and normal. Subglottic airway clear. Salivary glands: Salivary glands demonstrate no acute abnormality. Thyroid: Thyroid within normal limits. Lymph nodes: Few mildly prominent shotty subcentimeter lymph nodes noted within the neck bilaterally. No pathologically enlarged lymph nodes identified. Vascular: Normal intravascular enhancement seen throughout the neck. Limited intracranial: Unremarkable. Visualized orbits: Visualized  globes and orbital soft tissues within normal limits. Mastoids and visualized paranasal sinuses: Visualized paranasal sinuses are clear. Visualized mastoids and middle ear cavities are well pneumatized and free of fluid. Skeleton: No acute osseous abnormality. No worrisome lytic or blastic osseous lesions. Mild cervical spondylolysis at C3-4 through C5-6. Upper chest: Visualized upper chest within normal limits. Visualized lungs are clear. Other: Irregular hypodense collection positioned at the skin/subcutaneous fat of the anterolateral left neck measures 1.5 x 1.8 x 1.5 cm (series 3, image 40). Overlying skin thickening. Collection positioned superior to the left platysmas. Finding most consistent with soft tissue abscess given provided history. No significant inflammatory changes within the surrounding left neck. IMPRESSION: 1.5 x 1.8 x 1.5 cm irregular hypodense collection at the skin/subcutaneous fat of the anterolateral left neck, consistent with abscess given provided history. No significant inflammatory changes within the surrounding neck. Clinical follow-up to resolution recommended, as an underlying neoplastic process could also have this appearance. Electronically Signed   By: Jeannine Boga M.D.   On: 10/26/2017 20:51    Procedures .Marland KitchenIncision and Drainage Date/Time: 10/26/2017 11:37 PM Performed by: Deliah Boston, PA-C Authorized by: Deliah Boston, PA-C   Consent:    Consent obtained:  Verbal   Consent given by:  Patient   Risks discussed:  Bleeding, incomplete drainage, pain, infection and damage to other organs Location:    Type:  Abscess   Size:  2 cm in diameter   Location:  Neck   Neck location:  L anterior Pre-procedure details:    Skin preparation:  Betadine Anesthesia (see MAR for exact dosages):    Anesthesia method:  Local infiltration   Local anesthetic:  Lidocaine 1% w/o epi Procedure type:    Complexity:  Complex Procedure details:    Needle  aspiration: no     Incision types:  Single straight (Wound with scab from where it was Artie draining, minimal effort needed to open.)   Incision depth:  Subcutaneous   Scalpel blade:  11   Wound management:  Probed and deloculated, irrigated with saline and extensive cleaning   Drainage:  Purulent   Drainage amount:  Moderate   Wound treatment:  Wound left open   Packing materials:  1/4 in iodoform gauze   Amount 1/4" iodoform:  4cm Post-procedure details:    Patient tolerance of procedure:  Tolerated well, no immediate complications Comments:     Procedure tolerated well,  I made a approximately 2 cm incision across the top of the abscess, area was already scabbed from previous open draining.  Purulent material freely drained from wound, no signs of bleeding.  Wound packed with iodoform and covered with sterile gauze.  Patient moving neck freely and without pain after procedure, denies trouble swallowing.    (including critical care time)  Medications Ordered in ED Medications  iohexol (OMNIPAQUE) 300 MG/ML solution 100 mL (100 mLs Intravenous Contrast Given 10/26/17 1945)  lidocaine (PF) (XYLOCAINE) 1 % injection (  Given by Other 10/26/17 2229)     Initial Impression / Assessment and Plan / ED Course  I have reviewed the triage vital signs and the nursing notes.  Pertinent labs & imaging results that were available during my care of the patient were reviewed by me and considered in my medical decision making (see chart for details).    I have discussed the case with Dr. Kathrynn Humble who suggests ordering CT prior to I&D due to location of abscess. Patient with HIV/AIDS presenting with left neck abscess. CT shows that abscess is superficial.  I have performed incision and drainage of the abscess as above, packed and covered with sterile gauze.  I have placed the patient on Keflex and doxycycline for 7 days.  I have informed the patient that she must return to the emergency department in 2  days to have her wound checked and packing changed.  I have also informed the patient that she must follow-up with her infectious disease specialist in 1 week to ensure that her abscess is healing properly. Patient states that she understands the importance of taking off her antibiotic medications as prescribed and following up with both the emergency department in 2 days as well as her infectious disease specialist.  I have informed patient that she may take over-the-counter anti-inflammatory such as ibuprofen or Tylenol for pain as needed. Patient denies history of fever, headache, nausea vomiting or diarrhea.  At this time there does not appear to be any evidence of an acute emergency medical condition and the patient appears stable for discharge with appropriate outpatient follow up. Diagnosis was discussed with patient who verbalizes understanding and is agreeable to discharge. I have discussed return precautions with patient who verbalizes understanding of return precautions.  Patient states that we she will follow-up with the emergency department in 2 days for wound recheck and with her I&D specialist in 7 days.  Patient's case discussed with Dr. Kathrynn Humble who agrees with plan to discharge with antibiotics and follow-up.     This note was dictated using DragonOne dictation software; please contact for any inconsistencies within the note.     Final Clinical Impressions(s) / ED Diagnoses   Final diagnoses:  Abscess  Immunocompromised status associated with infection [facility])    ED Discharge Orders        Ordered    doxycycline (VIBRAMYCIN) 100 MG capsule  2 times daily     10/26/17 2231    cephALEXin (KEFLEX) 500 MG capsule  4 times daily     10/26/17 2231       Gari Crown 10/26/17 2346    Varney Biles, MD 10/27/17 [phone]

## 2017-10-26 NOTE — Discharge Instructions (Addendum)
You must return to the emergency department in 2-days to have your wound rechecked. You must follow-up with your infectious disease specialist in 1 week. Please return to emergency department sooner for new or worsening symptoms. Please take all of your antibiotic medications, Keflex/doxycycline, as prescribed. Do not remove the packing from your wound, this will be changed in 2 days during your next emergency department visit.  Please come back to the emergency department in 2 days.  Contact a doctor if: You have more redness, swelling, or pain around your abscess. You have more fluid or blood coming from your abscess. Your abscess feels warm when you touch it. You have more pus or a bad smell coming from your abscess. You have a fever. Your muscles ache. You have chills. You feel sick. Get help right away if: You have very bad (severe) pain. You have trouble swallowing or moving your neck. You see red streaks on your skin spreading away from the abscess.

## 2017-10-26 NOTE — ED Notes (Signed)
Pt ambulatory from lobby to exam room.

## 2017-10-28 ENCOUNTER — Encounter (HOSPITAL_COMMUNITY): Payer: Self-pay | Admitting: Emergency Medicine

## 2017-10-28 ENCOUNTER — Other Ambulatory Visit: Payer: Self-pay

## 2017-10-28 ENCOUNTER — Emergency Department (HOSPITAL_COMMUNITY)
Admission: EM | Admit: 2017-10-28 | Discharge: 2017-10-28 | Disposition: A | Discharge status: Home / Self Care | Payer: Self-pay | Attending: Emergency Medicine | Admitting: Emergency Medicine

## 2017-10-28 DIAGNOSIS — B20 Human immunodeficiency virus [HIV] disease: Secondary | ICD-10-CM | POA: Insufficient documentation

## 2017-10-28 DIAGNOSIS — I10 Essential (primary) hypertension: Secondary | ICD-10-CM | POA: Insufficient documentation

## 2017-10-28 DIAGNOSIS — F1210 Cannabis abuse, uncomplicated: Secondary | ICD-10-CM | POA: Insufficient documentation

## 2017-10-28 DIAGNOSIS — Z79899 Other long term (current) drug therapy: Secondary | ICD-10-CM | POA: Insufficient documentation

## 2017-10-28 DIAGNOSIS — Z5189 Encounter for other specified aftercare: Secondary | ICD-10-CM | POA: Insufficient documentation

## 2017-10-28 NOTE — ED Notes (Signed)
Pt declines to change into gown at this time, states that she is "only here to get [her neck] rechecked"

## 2017-10-28 NOTE — Discharge Instructions (Addendum)
Please applies warm moist compress to the affected area at least 3-5 times daily to help aid with healing.  You may remove packing in 48 hours or return to the ER for a wound recheck.  Continue taking antibiotics as prescribed.

## 2017-10-28 NOTE — ED Notes (Signed)
Patient states she is here for a wound check, states she had abscess drained on Monday.

## 2017-10-28 NOTE — ED Notes (Signed)
PA Mortis in to assess patient, advised pt is not appropriate for fast track

## 2017-10-28 NOTE — ED Provider Notes (Signed)
[facility] EMERGENCY DEPARTMENT Provider Note   CSN: [id number] Arrival date & time: 10/28/17  5329     History   Chief Complaint Chief Complaint  Patient presents with  . Wound Check    HPI Shirley White is a 47 y.o. female.  The history is provided by the patient. No language interpreter was used.  Wound Check  Pertinent negatives include no headaches.     47 year old female with history of HIV/AIDS presenting for evaluation of a wound recheck.  For the past week patient developed left-sided neck pain and swelling in which she was seen 2 days ago for his symptoms.  A CT soft tissue neck was obtained and patient was noted to have an abscess.  Patient was incised and drained, antibiotic given, and recommend to return in 2 days for wound recheck.  Patient report improvement of the symptoms.  She still endorse some pain but states is mild.  She has been taking the 2 antibiotics that was previously prescribed.  She denies any associated fever, trouble swallowing or worsening pain.  She was not told to use warm compress.  Past Medical History:  Diagnosis Date  . Anemia   . GERD (gastroesophageal reflux disease)   . HIV disease ([place])   . Hypertension     Patient Active Problem List   Diagnosis Date Noted  . Oral thrush 07/14/2017  . Nausea vomiting and diarrhea 07/14/2017  . Hypokalemia 10/28/2016  . Routine health maintenance 08/28/2016  . Immunocompromised status associated with infection ([place]) 02/03/2016  . Esophageal candidiasis ([place]) 02/02/2016  . Nonspecific chest pain 02/02/2016  . AIDS ([place]) 02/02/2016  . Onychomycosis 12/13/2015  . Iron deficiency anemia due to chronic blood loss   . H/O menorrhagia 09/05/2014  . Symptomatic anemia 09/05/2014  . Human immunodeficiency virus I infection ([place]) 06/04/2006  . Sickle-cell trait ([place]) 06/04/2006    Past Surgical History:  Procedure Laterality Date  . CESAREAN SECTION    .  ESOPHAGOGASTRODUODENOSCOPY N/A 02/03/2016   Procedure: ESOPHAGOGASTRODUODENOSCOPY (EGD);  Surgeon: Manus Gunning, MD;  Location: [facility];  Service: Gastroenterology;  Laterality: N/A;     OB History    Gravida  4   Para  4   Term  4   Preterm      AB      Living        SAB      TAB      Ectopic      Multiple      Live Births               Home Medications    Prior to Admission medications   Medication Sig Start Date End Date Taking? Authorizing Provider  BIKTARVY 50-200-25 MG TABS tablet TAKE 1 TABLET BY MOUTH DAILY, TO TAKE AT THE SAME TIME EACH DAY WITH OR WITHOUT FOOD 11/28/16   [place] Callas, NP  cephALEXin (KEFLEX) 500 MG capsule Take 1 capsule (500 mg total) by mouth 4 (four) times daily for 7 days. 10/26/17 11/02/17  Nuala Alpha A, PA-C  doxycycline (VIBRAMYCIN) 100 MG capsule Take 1 capsule (100 mg total) by mouth 2 (two) times daily for 7 days. 10/26/17 11/02/17  Nuala Alpha A, PA-C  fluconazole (DIFLUCAN) 200 MG tablet Take 1 tablet (200 mg total) by mouth daily. 07/16/17   Kayleen Memos, DO  hydrOXYzine (ATARAX/VISTARIL) 25 MG tablet Take 1 tablet (25 mg total) by mouth every 6 (six) hours as needed for  itching. 09/29/17   Zigmund Gottron, NP  norethindrone (AYGESTIN) 5 MG tablet Take 1 tablet (5 mg total) by mouth daily. 07/15/17   Kayleen Memos, DO  senna-docusate (SENOKOT-S) 8.6-50 MG tablet Take 1 tablet by mouth at bedtime as needed for mild constipation. 07/15/17   Kayleen Memos, DO    Family History Family History  Problem Relation Age of Onset  . Hyperlipidemia Mother   . Hypertension Mother   . Cancer Father     Social History Social History   Tobacco Use  . Smoking status: Never Smoker  . Smokeless tobacco: Never Used  Substance Use Topics  . Alcohol use: Yes    Alcohol/week: 0.6 oz    Types: 1 Cans of beer per week    Comment: occa  . Drug use: No    Frequency: 7.0 times per week    Types: Marijuana      Allergies   Patient has no known allergies.   Review of Systems Review of Systems  Constitutional: Negative for fever.  Musculoskeletal: Positive for neck pain.  Skin: Positive for wound.  Neurological: Negative for headaches.     Physical Exam Updated Vital Signs BP (!) 133/93 (BP Location: Right Arm)   Pulse 83   Temp 98.3 F (36.8 C) (Oral)   Resp 16   SpO2 100%   Physical Exam  Constitutional: She appears well-developed and well-nourished. No distress.  HENT:  Head: Atraumatic.  Eyes: Conjunctivae are normal.  Neck: Neck supple.  Cutaneous abscess noted to left lateral neck with recent incision and drainage and packing in place.  No surrounding skin erythema.  Mild induration.  Neurological: She is alert.  Skin: No rash noted.  Psychiatric: She has a normal mood and affect.  Nursing note and vitals reviewed.    ED Treatments / Results  Labs (all labs ordered are listed, but only abnormal results are displayed) Labs Reviewed - No data to display  EKG None  Radiology Ct Soft Tissue Neck W Contrast  Result Date: 10/26/2017 CLINICAL DATA:  Initial evaluation for left-sided neck abscess for several days. EXAM: CT NECK WITH CONTRAST TECHNIQUE: Multidetector CT imaging of the neck was performed using the standard protocol following the bolus administration of intravenous contrast. CONTRAST:  179mL OMNIPAQUE IOHEXOL 300 MG/ML  SOLN COMPARISON:  None available. FINDINGS: Pharynx and larynx: Oral cavity within normal limits. No acute abnormality about the dentition. Palatine tonsils symmetric and within normal limits. No base of tongue lesion. Nasopharynx and oropharynx within normal limits. No retropharyngeal collection. Epiglottis within normal limits. Vallecula clear. Remainder of the hypopharynx and supraglottic larynx within normal limits. True cords symmetric and normal. Subglottic airway clear. Salivary glands: Salivary glands demonstrate no acute abnormality.  Thyroid: Thyroid within normal limits. Lymph nodes: Few mildly prominent shotty subcentimeter lymph nodes noted within the neck bilaterally. No pathologically enlarged lymph nodes identified. Vascular: Normal intravascular enhancement seen throughout the neck. Limited intracranial: Unremarkable. Visualized orbits: Visualized globes and orbital soft tissues within normal limits. Mastoids and visualized paranasal sinuses: Visualized paranasal sinuses are clear. Visualized mastoids and middle ear cavities are well pneumatized and free of fluid. Skeleton: No acute osseous abnormality. No worrisome lytic or blastic osseous lesions. Mild cervical spondylolysis at C3-4 through C5-6. Upper chest: Visualized upper chest within normal limits. Visualized lungs are clear. Other: Irregular hypodense collection positioned at the skin/subcutaneous fat of the anterolateral left neck measures 1.5 x 1.8 x 1.5 cm (series 3, image 40). Overlying skin thickening. Collection  positioned superior to the left platysmas. Finding most consistent with soft tissue abscess given provided history. No significant inflammatory changes within the surrounding left neck. IMPRESSION: 1.5 x 1.8 x 1.5 cm irregular hypodense collection at the skin/subcutaneous fat of the anterolateral left neck, consistent with abscess given provided history. No significant inflammatory changes within the surrounding neck. Clinical follow-up to resolution recommended, as an underlying neoplastic process could also have this appearance. Electronically Signed   By: Jeannine Boga M.D.   On: 10/26/2017 20:51    Procedures Procedures (including critical care time)  Medications Ordered in ED Medications - No data to display   Initial Impression / Assessment and Plan / ED Course  I have reviewed the triage vital signs and the nursing notes.  Pertinent labs & imaging results that were available during my care of the patient were reviewed by me and considered  in my medical decision making (see chart for details).     BP (!) 133/93 (BP Location: Right Arm)   Pulse 83   Temp 98.3 F (36.8 C) (Oral)   Resp 16   SpO2 100%    Final Clinical Impressions(s) / ED Diagnoses   Final diagnoses:  Visit for wound check    ED Discharge Orders    None     10:10 AM Patient has a cutaneous abscess to the left side of her neck that was incised and drained 2 days prior.  Packing was in place.  Patient discharged home with 2 antibiotics.  She is here for wound recheck.  There is still moderate amount of purulent discharge coming from the wound with packing in place.  Additional incised and drainage procedure is not indicated at this time however I recommend using warm compress 3-5 times daily for the next 2 days and may return in 48 hours for packing removal and wound recheck.   Domenic Moras, PA-C 10/28/17 [place], [place], DO 10/28/17 1032

## 2017-10-28 NOTE — ED Triage Notes (Signed)
Pt reports she is here for recheck of abcess that was I&D'ed on Monday.  purulent drainage present on wound dressing

## 2017-10-29 LAB — AEROBIC CULTURE  (SUPERFICIAL SPECIMEN)

## 2017-10-29 LAB — AEROBIC CULTURE W GRAM STAIN (SUPERFICIAL SPECIMEN)

## 2017-10-30 ENCOUNTER — Telehealth: Payer: Self-pay | Admitting: *Deleted

## 2017-10-30 ENCOUNTER — Telehealth: Payer: Self-pay

## 2017-10-30 NOTE — Telephone Encounter (Signed)
Post ED Visit - Positive Culture Follow-up  Culture report reviewed by antimicrobial stewardship pharmacist:  []  Elenor Quinones, Pharm.D. []  Heide Guile, Pharm.D., BCPS AQ-ID []  Parks Neptune, Pharm.D., BCPS []  Alycia Rossetti, Pharm.D., BCPS []  [place], Pharm.D., BCPS, AAHIVP []  Legrand Como, Pharm.D., BCPS, AAHIVP [x]  Salome Arnt, PharmD, BCPS []  Johnnette Gourd, PharmD, BCPS []  Hughes Better, PharmD, BCPS []  Leeroy Cha, PharmD  Positive Aerobic culture Treated with Doxycycline, organism sensitive to the same and no further patient follow-up is required at this time.  Genia Del 10/30/2017, 10:23 AM

## 2017-10-30 NOTE — Telephone Encounter (Signed)
Received a return call from Shirley White stating she is so glad I called and often forgets that she can call me for assistance with her medications. She would like to continue to receive assistance from me so at this time I will request additional orders.   Initial referral received on 02/10/17 by Dr Linus Salmons with 1st contact made on 02/10/17. Several documented attempts made to engage with the patient with success on 04/02/17.   Patient was 1st evaluated 04/02/17 for CBHCNS. Patient was consented to care at this time.     Frequency / Duration of CBHCN visits: Effective 10/30/17: 103mo1, 39mo3   4 PRN's for complications with disease process/progression, medication changes or concerns   CBHCN will assess for learning needs related to diagnosis and treatment regimen, provide education as needed, fill pill box if needed, address any barriers which may be preventing medication compliance, and communicating with care team including physician and case manager.   Individualized [place] Period from 10/30/17 to 01/28/18  a. Type of service(s) and care to be delivered: RN Case Management  b. Frequency and duration of service: Effective 10/30/17: 63mo1, 77mo3 4 prns  for complications with disease process/progression, medication changes or  concerns . Visits/Contact may be conducted telephonically or in person to  best suit the patient. Patient works at [employer] during the day.  c. Activity restrictions: Pt may be up as tolerated and can safely ambulate  without the need for a assistive device   d. Safety Measures: Standard Precautions/Infection Control   e. Service Objectives and Goals: Service Objectives are to assist the pt with  HIV medication regimen adherence and staying in care with the Infectious  Disease Clinic by identifying barriers to care. RN will address the barriers  that  are identified by the patient. Patient current states she is sick and would like to get her medications.   f.  Equipment required: No additional equipment needs at this time   g. Functional Limitations: None Noted   h. Rehabilitation potential: Guarded   i. Diet and Nutritional Needs: Regular Diet   j. Medications and treatments: Medications have been reconciled and  reviewed and are a part of EPIC electronic file   k. Specific therapies if needed: RN   l. Pertinent diagnoses: HIV disease,  Hx of medication NonCompliance   m. Expected outcome: Guarded

## 2017-10-30 NOTE — Telephone Encounter (Addendum)
RN contacted the patient today. Purpose of the call is to stay connected with the patient. RN left a message stating that I wanted to be sure all is well and to please let me know if I can assist in any way.   At this time the patient's plan of care orders have expired and I will have to discharge her. I look forward to connecting with Ms Pasternak again and at that time new orders well be requested.   The intent of this communication is to inform the Health Care Team that this patient will be discharged from [facility]).  Greater than 3 attempts have been made to re-engage the patient  without any success .Moving forward, the [facility] will be willing to reopen the patient to services if and when the patient is ready to discuss medication adherence and HIV disease  management. Effective 10/30/17 patient will be discharged and removed from [facility] active patient listing.

## 2017-10-30 NOTE — Telephone Encounter (Signed)
I approve of the POC as outlined 

## 2017-11-02 ENCOUNTER — Ambulatory Visit: Payer: Self-pay | Admitting: Infectious Diseases

## 2017-11-19 ENCOUNTER — Other Ambulatory Visit: Payer: Self-pay

## 2017-11-19 DIAGNOSIS — B20 Human immunodeficiency virus [HIV] disease: Secondary | ICD-10-CM

## 2017-11-20 LAB — T-HELPER CELL (CD4) - (RCID CLINIC ONLY)
CD4 T CELL ABS: 40 /uL — AB (ref 400–2700)
CD4 T CELL HELPER: 2 % — AB (ref 33–55)

## 2017-11-23 LAB — HIV-1 RNA QUANT-NO REFLEX-BLD
HIV 1 RNA Quant: 826 copies/mL — ABNORMAL HIGH
HIV-1 RNA Quant, Log: 2.92 Log copies/mL — ABNORMAL HIGH

## 2017-11-23 NOTE — Progress Notes (Signed)
Yes, much better

## 2017-11-26 ENCOUNTER — Other Ambulatory Visit: Payer: Self-pay | Admitting: *Deleted

## 2017-11-26 DIAGNOSIS — B20 Human immunodeficiency virus [HIV] disease: Secondary | ICD-10-CM

## 2017-11-26 MED ORDER — BICTEGRAVIR-EMTRICITAB-TENOFOV 50-200-25 MG PO TABS: ORAL_TABLET | ORAL | 4 refills | Status: DC

## 2017-12-01 ENCOUNTER — Ambulatory Visit: Payer: Self-pay | Admitting: *Deleted

## 2017-12-01 ENCOUNTER — Encounter: Payer: Self-pay | Admitting: Infectious Diseases

## 2017-12-01 DIAGNOSIS — B20 Human immunodeficiency virus [HIV] disease: Secondary | ICD-10-CM

## 2017-12-07 ENCOUNTER — Telehealth: Payer: Self-pay | Admitting: Pharmacist

## 2017-12-07 ENCOUNTER — Ambulatory Visit: Payer: Self-pay | Admitting: *Deleted

## 2017-12-07 DIAGNOSIS — Z7189 Other specified counseling: Secondary | ICD-10-CM

## 2017-12-07 DIAGNOSIS — B20 Human immunodeficiency virus [HIV] disease: Secondary | ICD-10-CM

## 2017-12-07 MED ORDER — BICTEGRAVIR-EMTRICITAB-TENOFOV 50-200-25 MG PO TABS: 1.0000 | ORAL_TABLET | Freq: Every day | ORAL | 5 refills | Status: DC

## 2017-12-07 MED FILL — BIKTARVY 50-200-25 MG TABS: 50-200-25 | 30 days supply | Qty: 30 | Fill #0

## 2017-12-07 NOTE — Telephone Encounter (Signed)
Patient is approved to get [place] from [place] advancing access program until 12/05/18. Will send to [facility] and [employer] will pick up for patient.

## 2017-12-08 ENCOUNTER — Encounter: Payer: Self-pay | Admitting: Infectious Diseases

## 2017-12-08 ENCOUNTER — Ambulatory Visit (INDEPENDENT_AMBULATORY_CARE_PROVIDER_SITE_OTHER): Payer: Self-pay | Admitting: Infectious Diseases

## 2017-12-08 VITALS — BP 129/81 | HR 101 | Temp 98.6°F | Wt 97.0 lb

## 2017-12-08 DIAGNOSIS — Z23 Encounter for immunization: Secondary | ICD-10-CM

## 2017-12-08 DIAGNOSIS — B370 Candidal stomatitis: Secondary | ICD-10-CM

## 2017-12-08 DIAGNOSIS — Z862 Personal history of diseases of the blood and blood-forming organs and certain disorders involving the immune mechanism: Secondary | ICD-10-CM

## 2017-12-08 DIAGNOSIS — B20 Human immunodeficiency virus [HIV] disease: Secondary | ICD-10-CM

## 2017-12-08 DIAGNOSIS — Z8742 Personal history of other diseases of the female genital tract: Secondary | ICD-10-CM

## 2017-12-08 DIAGNOSIS — D696 Thrombocytopenia, unspecified: Secondary | ICD-10-CM

## 2017-12-08 DIAGNOSIS — Z0000 Encounter for general adult medical examination without abnormal findings: Secondary | ICD-10-CM

## 2017-12-08 MED ORDER — SULFAMETHOXAZOLE-TRIMETHOPRIM 400-80 MG PO TABS: 1.0000 | ORAL_TABLET | Freq: Every day | ORAL | 5 refills | Status: DC

## 2017-12-08 MED ORDER — MEGESTROL ACETATE 400 MG/10ML PO SUSP
400.0000 mg | Freq: Every day | ORAL | 5 refills | Status: DC
Start: 2017-12-08 — End: 2018-04-29

## 2017-12-08 NOTE — Progress Notes (Signed)
Name: Shirley White  DOB: 1970/12/24 MRN: [id number] PCP: Neva Seat, MD    Patient Active Problem List   Diagnosis Date Noted  . Thrombocytopenia ([place]) 12/12/2017  . Routine health maintenance 08/28/2016  . Nonspecific chest pain 02/02/2016  . AIDS ([place]) 02/02/2016  . H/O menorrhagia 09/05/2014  . History of iron deficiency anemia 09/05/2014  . ANEMIA-IRON DEFICIENCY 06/04/2006  . Sickle-cell trait ([place]) 06/04/2006     Brief Narrative:  Shirley White is a 47 y.o. woman with HIV disease originally diagnosed in 2005. Poor adherence with AIDS qualifying CD4s since 2016 with CD4 nadir 30. HIV Risk: heterosexual. History of OIs: esophageal candidiasis.   Previous Regimens: . Complera 2011 >> difficulty swallowing regimen . Emtriva + Edurant + Viread 2011 through 01-2014 . Odefsey 2016 . Tiviay + Truvada 2018   Genotypes: . 07-5036 - sensitive . 11-2016 - sensitive   Subjective:   Chief Complaint  Patient presents with  . Follow-up    hiv   HPI/ROS:  [name] is here today for follow up for her HIV care. She is currently taking Biktarvy once daily and has tolerated this well and has no concerns over medication side effect. She has access to her medications and likes it. This is the first time she has taken her HIV medications consistently in a long time. She has a new boyfriend whom is also HIV+ and a patient here. He is on the same medication and undetectable. He motivates her and helps to hold her accountable. They both take it daily at 8:00am. She does have concern about her weight loss today. She feels like she has no appetite and would like help with this. She has reapplied for HMAP but has not heard back about approval yet. Reports no complaints today suggestive of associated opportunistic infection or advancing HIV disease such as fevers, night sweats, cough, SOB, nausea, vomiting, diarrhea, headache, sensory changes, lymphadenopathy or oral thrush.    Reviewed her last few ER visits since our last visit together 1 year ago - bleeding and anemia predominates. Has required transfusions of PRBCs. Not currently maintained on iron. Also with multiple rashes and abscesses requiring drainage (MRSA).   Review of Systems  Constitutional: Negative for chills, fever, malaise/fatigue and weight loss.  HENT: Negative for sore throat.   Respiratory: Negative for cough, sputum production and shortness of breath.   Cardiovascular: Negative.   Gastrointestinal: Negative for abdominal pain, diarrhea and vomiting.  Musculoskeletal: Negative for joint pain, myalgias and neck pain.  Skin: Negative for rash.  Neurological: Negative for headaches.  Psychiatric/Behavioral: Negative for depression and substance abuse. The patient is not nervous/anxious.     Past Medical History:  Diagnosis Date  . Anemia   . GERD (gastroesophageal reflux disease)   . HIV disease ([place])   . Hypertension     Outpatient Medications Prior to Visit  Medication Sig Dispense Refill  . bictegravir-emtricitabine-tenofovir AF (BIKTARVY) 50-200-25 MG TABS tablet Take 1 tablet by mouth daily. 30 tablet 5  . hydrOXYzine (ATARAX/VISTARIL) 25 MG tablet Take 1 tablet (25 mg total) by mouth every 6 (six) hours as needed for itching. (Patient not taking: Reported on 12/08/2017) 12 tablet 0  . norethindrone (AYGESTIN) 5 MG tablet Take 1 tablet (5 mg total) by mouth daily. (Patient not taking: Reported on 12/08/2017) 30 tablet 0  . senna-docusate (SENOKOT-S) 8.6-50 MG tablet Take 1 tablet by mouth at bedtime as needed for mild constipation. (Patient not taking: Reported on 12/08/2017) 14 tablet  0  . fluconazole (DIFLUCAN) 200 MG tablet Take 1 tablet (200 mg total) by mouth daily. (Patient not taking: Reported on 12/08/2017) 30 tablet 0   No facility-administered medications prior to visit.      No Known Allergies  Social History   Tobacco Use  . Smoking status: Never Smoker  . Smokeless  tobacco: Never Used  Substance Use Topics  . Alcohol use: Yes    Alcohol/week: 1.0 standard drinks    Types: 1 Cans of beer per week    Comment: occa  . Drug use: No    Frequency: 7.0 times per week    Types: Marijuana    Family History  Problem Relation Age of Onset  . Hyperlipidemia Mother   . Hypertension Mother   . Cancer Father     Social History   Substance and Sexual Activity  Sexual Activity Yes  . Partners: Male  . Birth control/protection: Condom   Comment: given condoms     Objective:   Vitals:   12/08/17 0925  BP: 129/81  Pulse: (!) 101  Temp: 98.6 F (37 C)  Weight: 97 lb (44 kg)   Body mass index is 20.99 kg/m.  Physical Exam  Constitutional: She is oriented to person, place, and time. She appears well-developed.  Very thin, chronically ill-appearing woman seated comfortably in the chair. Smiling and appears happy and feeling well today.   HENT:  Mouth/Throat: Mucous membranes are normal. No oral lesions. Normal dentition. No dental abscesses. No oropharyngeal exudate.  Eyes: Pupils are equal, round, and reactive to light.  Cardiovascular: Normal rate and regular rhythm.  Murmur heard. Pulmonary/Chest: Effort normal and breath sounds normal.  Abdominal: Soft. She exhibits no distension. There is no tenderness.  Lymphadenopathy:    She has no cervical adenopathy.  Neurological: She is alert and oriented to person, place, and time.  Skin: Skin is warm and dry. No rash noted.  Various scars, flat hyperpigmented rashes noted over her arms. No open sores.   Psychiatric: She has a normal mood and affect. Judgment normal.  In good spirits today and engaged in care discussion    Lab Results Lab Results  Component Value Date   WBC 3.0 (L) 10/26/2017   HGB 12.6 10/26/2017   HCT 37.0 10/26/2017   MCV 85.8 10/26/2017   PLT 31 (L) 10/26/2017    Lab Results  Component Value Date   CREATININE 0.30 (L) 10/26/2017   BUN 6 10/26/2017   NA 139  10/26/2017   K 3.8 10/26/2017   CL 107 10/26/2017   CO2 24 07/14/2017    Lab Results  Component Value Date   ALT 10 12/16/2016   AST 14 12/16/2016   ALKPHOS 60 12/16/2016   BILITOT 0.6 12/16/2016    Lab Results  Component Value Date   CHOL 143 08/21/2015   HDL 61 08/21/2015   LDLCALC 70 08/21/2015   TRIG 62 08/21/2015   CHOLHDL 2.3 08/21/2015   HIV 1 RNA Quant (copies/mL)  Date Value  11/19/2017 826 (H)  07/14/2017 56,800  12/16/2016 5,880 (H)   CD4 T Cell Abs (/uL)  Date Value  11/19/2017 40 (L)  07/14/2017 50 (L)  12/16/2016 40 (L)     Assessment & Plan:   Problem List Items Addressed This Visit      Digestive   RESOLVED: Oral thrush    Resolved after resuming HAART and tx with diflucan.       Relevant Medications   sulfamethoxazole-trimethoprim (BACTRIM,SEPTRA) 400-80  MG tablet     Other   Thrombocytopenia (HCC)    Lab Results  Component Value Date   PLT 31 (L) 10/26/2017   Her platelets are very low from last month in ER setting. Will check again in 4 weeks. Considering iron deficiency, anemia and sickle cell trait history will refer to Hematology. Likely d/t her untreated HIV/AIDS. Needs ER evaluation should she experience unstoppable bleeding episodes.       Routine health maintenance    Pap smear/pelvic exam and breast exam next visit.  Menveo today and #2 after 2 months.        History of iron deficiency anemia    Previously maintained on iron supplements however she is not currently taking them. She has stopped them in the past d/t constipation. Will check iron stores next lab draw for her and consider re-initiating iron polysaccharide - will need to be mindful about giving this with biktarvy.  Her hemaglobin has been stable > 10 since April from lab review. Anemia certainly a combination of uncontrolled long standing HIV/AIDS, sickle cell trait and iron deficiency. It may be best to initiate Fe infusions and get her in to see hematology. Will  discuss at upcoming appointment.       H/O menorrhagia    Currently menses are under control per patient's report and irregular.       AIDS ([place])    HIV 1 RNA Quant (copies/mL)  Date Value  11/19/2017 826 (H)    I reviewed labs with Toyia today including viral load that for the first time has been below 1,000 in years. She is very happy about this today. In discussing her CD4 count, it is going to take a long time for this to reconstitute and she may never have counts > 200 with the long term damage. I asked her to please keep taking her Phillips Odor (she needs small pills to swallow easily) and make sure she works on a process to hold her own self accountable and not linked to another person - although I am grateful for her new boyfriend and his impact on her.   Will have her return in 4 weeks to keep a close eye on any unmasking of medical issues yet to be realized. She will continue taking her Biktarvy and her Bactrim (we provided her with 3 weeks worth today of 1 SS tab daily). Will initiate MAC prophylaxis with Azithro when her HMAP is approved.       Relevant Medications   sulfamethoxazole-trimethoprim (BACTRIM,SEPTRA) 400-80 MG tablet    Other Visit Diagnoses    HIV disease ([place])    -  Primary   Relevant Medications   sulfamethoxazole-trimethoprim (BACTRIM,SEPTRA) 400-80 MG tablet   Other Relevant Orders   MENINGOCOCCAL MCV4O (Completed)      Janene Madeira, MSN, NP-C [place] for [facility] [place] Pager: [phone] Office: [phone]  12/12/17  7:40 AM

## 2017-12-08 NOTE — Patient Instructions (Addendum)
Wonderful to see you!   Keep taking your Biktarvy once a day at 800 am as you are now. Great job and great progress.   Will give you 3 weeks worth of bactrim (antibiotic to protect you until your immune system gets better). This will eventually be covered by ADAP.   Will also start you on Megace for your appetite stimulant once ADAP approved.   Will see you back in 4 weeks and do your pap smear at this time.

## 2017-12-08 NOTE — Progress Notes (Signed)
Conferenced with Cassie/[facility] on the status of the Advanced Access application and it has been approved. Script sent to [facility].   Traveled to [facility] and picked up the [employer] for [place].  Called Dawnette and spoke with her partner(Travis) who stated Marykay is currently not home but will return my call after 4pm.  Called received after 4pm and RN traveled to Sarann's home to deliver the medications. We briefly discussed ay possible side effects to the medication. Akiba denies any concerns with medication tolerance or adherence and states her appetite has actually increased. She confirmed her plans to come to the clinic tomorrow for her appt and [facility] has provided her with 2 bus passes to ensure she has transportation.

## 2017-12-08 NOTE — Patient Instructions (Signed)
Next office appt scheduled for tomorrow morning

## 2017-12-10 NOTE — Assessment & Plan Note (Signed)
Resolved after resuming HAART and tx with diflucan.

## 2017-12-12 DIAGNOSIS — D696 Thrombocytopenia, unspecified: Secondary | ICD-10-CM | POA: Insufficient documentation

## 2017-12-12 NOTE — Assessment & Plan Note (Signed)
Previously maintained on iron supplements however she is not currently taking them. She has stopped them in the past d/t constipation. Will check iron stores next lab draw for her and consider re-initiating iron polysaccharide - will need to be mindful about giving this with biktarvy.  Her hemaglobin has been stable > 10 since April from lab review. Anemia certainly a combination of uncontrolled long standing HIV/AIDS, sickle cell trait and iron deficiency. It may be best to initiate Fe infusions and get her in to see hematology. Will discuss at upcoming appointment.

## 2017-12-12 NOTE — Assessment & Plan Note (Signed)
Lab Results  Component Value Date   PLT 31 (L) 10/26/2017   Her platelets are very low from last month in ER setting. Will check again in 4 weeks. Considering iron deficiency, anemia and sickle cell trait history will refer to Hematology. Likely d/t her untreated HIV/AIDS. Needs ER evaluation should she experience unstoppable bleeding episodes.

## 2017-12-12 NOTE — Assessment & Plan Note (Signed)
Pap smear/pelvic exam and breast exam next visit.  Menveo today and #2 after 2 months.

## 2017-12-12 NOTE — Assessment & Plan Note (Signed)
HIV 1 RNA Quant (copies/mL)  Date Value  11/19/2017 826 (H)    I reviewed labs with Shirley White today including viral load that for the first time has been below 1,000 in years. She is very happy about this today. In discussing her CD4 count, it is going to take a long time for this to reconstitute and she may never have counts > 200 with the long term damage. I asked her to please keep taking her Phillips Odor (she needs small pills to swallow easily) and make sure she works on a process to hold her own self accountable and not linked to another person - although I am grateful for her new boyfriend and his impact on her.   Will have her return in 4 weeks to keep a close eye on any unmasking of medical issues yet to be realized. She will continue taking her Biktarvy and her Bactrim (we provided her with 3 weeks worth today of 1 SS tab daily). Will initiate MAC prophylaxis with Azithro when her HMAP is approved.

## 2017-12-12 NOTE — Assessment & Plan Note (Signed)
Currently menses are under control per patient's report and irregular.

## 2017-12-29 NOTE — Progress Notes (Signed)
Unscheduled visit made for Shirley White to sign needed to documentation so we can get her medications. Form has been signed and traveled to [facility] to get a copy of Ngina's paystubs to complete the requirements for medication assistance. Needed ppk taken to [facility] and given to financial counselor/Ashley

## 2017-12-30 ENCOUNTER — Other Ambulatory Visit: Payer: Self-pay | Admitting: *Deleted

## 2017-12-30 MED ORDER — SULFAMETHOXAZOLE-TRIMETHOPRIM 400-80 MG PO TABS: 1.0000 | ORAL_TABLET | Freq: Every day | ORAL | 5 refills | Status: DC

## 2017-12-31 MED FILL — BIKTARVY 50-200-25 MG TABS: 50-200-25 | 30 days supply | Qty: 30 | Fill #1

## 2018-01-05 ENCOUNTER — Encounter: Payer: Self-pay | Admitting: Infectious Diseases

## 2018-01-05 ENCOUNTER — Ambulatory Visit (INDEPENDENT_AMBULATORY_CARE_PROVIDER_SITE_OTHER): Payer: Self-pay | Admitting: Infectious Diseases

## 2018-01-05 VITALS — BP 117/77 | HR 74 | Temp 98.4°F | Ht <= 58 in | Wt 96.0 lb

## 2018-01-05 DIAGNOSIS — Z1231 Encounter for screening mammogram for malignant neoplasm of breast: Secondary | ICD-10-CM

## 2018-01-05 DIAGNOSIS — Z23 Encounter for immunization: Secondary | ICD-10-CM

## 2018-01-05 DIAGNOSIS — Z0000 Encounter for general adult medical examination without abnormal findings: Secondary | ICD-10-CM

## 2018-01-05 DIAGNOSIS — Z1239 Encounter for other screening for malignant neoplasm of breast: Secondary | ICD-10-CM

## 2018-01-05 DIAGNOSIS — R630 Anorexia: Secondary | ICD-10-CM | POA: Insufficient documentation

## 2018-01-05 DIAGNOSIS — B20 Human immunodeficiency virus [HIV] disease: Secondary | ICD-10-CM

## 2018-01-05 DIAGNOSIS — Z862 Personal history of diseases of the blood and blood-forming organs and certain disorders involving the immune mechanism: Secondary | ICD-10-CM

## 2018-01-05 NOTE — Assessment & Plan Note (Signed)
Check viral load and CD4 today. If her CD4 is still < 50 after 2 months of therapy will add azithromycin weekly. Continue Biktarvy and Bactrim once a day. She is excited to see the results of her lab work so she can tell her partner. Return in 10 weeks for follow up.

## 2018-01-05 NOTE — Progress Notes (Signed)
Name: Richard Holz Charrette  DOB: 12-11-1970 MRN: [id number] PCP: Neva Seat, MD   Patient Active Problem List   Diagnosis Date Noted  . Anorexia 01/05/2018  . Thrombocytopenia ([place]) 12/12/2017  . Routine health maintenance 08/28/2016  . AIDS ([place]) 02/02/2016  . H/O menorrhagia 09/05/2014  . History of iron deficiency anemia 09/05/2014  . ANEMIA-IRON DEFICIENCY 06/04/2006  . Sickle-cell trait ([place]) 06/04/2006     Brief Narrative:  Nanci Lakatos Dobies is a 47 y.o. woman with HIV disease originally diagnosed in 2005. Poor adherence with AIDS qualifying CD4s since 2016 with CD4 nadir 30. HIV Risk: heterosexual. History of OIs: esophageal candidiasis.   Previous Regimens: . Complera 2011 >> difficulty swallowing regimen . Emtriva + Edurant + Viread 2011 through 01-2014 . Odefsey 2016 . Tiviay + Truvada 2018   Genotypes: . 05-2295 - sensitive . 11-2016 - sensitive   Subjective:   Chief Complaint  Patient presents with  . Follow-up    AIDS   HPI/ROS:  Zinia is here today for follow up for her HIV care. She continues to take her Biktarvy once daily over the last month and reports no missed doses. Both she and her boyfriend take it daily at [place] has been approved through 07/13/2018 and she has begun taking her Megace daily for anorexia. She is still around 96 lbs but feels that it is helping increase her appetite.   Last pap smear 2009 - normal cytology. We planned on pap smear today however she just started her menses and would like to post-pone. Would like the flu shot today. She has never had a mammogram for breast cancer screening. No family history from what she recalls.   Review of Systems  Constitutional: Negative for chills, fever, malaise/fatigue and weight loss.  HENT: Negative for sore throat.   Respiratory: Negative for cough, sputum production and shortness of breath.   Cardiovascular: Negative.   Gastrointestinal: Negative for abdominal pain, diarrhea  and vomiting.  Musculoskeletal: Negative for joint pain, myalgias and neck pain.  Skin: Negative for rash.  Neurological: Negative for headaches.  Psychiatric/Behavioral: Negative for depression and substance abuse. The patient is not nervous/anxious.     Past Medical History:  Diagnosis Date  . Anemia   . GERD (gastroesophageal reflux disease)   . HIV disease ([place])   . Hypertension     Outpatient Medications Prior to Visit  Medication Sig Dispense Refill  . bictegravir-emtricitabine-tenofovir AF (BIKTARVY) 50-200-25 MG TABS tablet Take 1 tablet by mouth daily. 30 tablet 5  . hydrOXYzine (ATARAX/VISTARIL) 25 MG tablet Take 1 tablet (25 mg total) by mouth every 6 (six) hours as needed for itching. 12 tablet 0  . megestrol (MEGACE) 400 MG/10ML suspension Take 10 mLs (400 mg total) by mouth daily. 240 mL 5  . norethindrone (AYGESTIN) 5 MG tablet Take 1 tablet (5 mg total) by mouth daily. 30 tablet 0  . senna-docusate (SENOKOT-S) 8.6-50 MG tablet Take 1 tablet by mouth at bedtime as needed for mild constipation. 14 tablet 0  . sulfamethoxazole-trimethoprim (BACTRIM,SEPTRA) 400-80 MG tablet Take 1 tablet by mouth daily. 30 tablet 5   No facility-administered medications prior to visit.      No Known Allergies  Social History   Tobacco Use  . Smoking status: Never Smoker  . Smokeless tobacco: Never Used  Substance Use Topics  . Alcohol use: Yes    Alcohol/week: 1.0 standard drinks    Types: 1 Cans of beer per week    Comment:  occa  . Drug use: No    Frequency: 7.0 times per week    Types: Marijuana    Family History  Problem Relation Age of Onset  . Hyperlipidemia Mother   . Hypertension Mother   . Cancer Father     Social History   Substance and Sexual Activity  Sexual Activity Yes  . Partners: Male  . Birth control/protection: Condom   Comment: given condoms     Objective:   Vitals:   01/05/18 0856  BP: 117/77  Pulse: 74  Temp: 98.4 F (36.9 C)    Weight: 96 lb (43.5 kg)  Height: 4\' 9"  (1.448 m)   Body mass index is 20.77 kg/m.  Physical Exam  Constitutional: She is oriented to person, place, and time. She appears well-developed.  Very thin, chronically ill-appearing woman seated comfortably in the chair. Smiling and appears happy and feeling well today.   HENT:  Mouth/Throat: Mucous membranes are normal. No oral lesions. Normal dentition. No dental abscesses. No oropharyngeal exudate.  Eyes: Pupils are equal, round, and reactive to light.  Cardiovascular: Normal rate and regular rhythm.  Murmur (systolic 2/6 RLSB/apex) heard. Pulmonary/Chest: Effort normal and breath sounds normal.  Abdominal: Soft. She exhibits no distension. There is no tenderness.  Lymphadenopathy:    She has no cervical adenopathy.  Neurological: She is alert and oriented to person, place, and time.  Skin: Skin is warm and dry. No rash noted.  Various scars, flat hyperpigmented rashes noted over her arms. No open sores.   Psychiatric: She has a normal mood and affect. Judgment normal.  In good spirits today and engaged in care discussion    Lab Results Lab Results  Component Value Date   WBC 3.0 (L) 10/26/2017   HGB 12.6 10/26/2017   HCT 37.0 10/26/2017   MCV 85.8 10/26/2017   PLT 31 (L) 10/26/2017    Lab Results  Component Value Date   CREATININE 0.30 (L) 10/26/2017   BUN 6 10/26/2017   NA 139 10/26/2017   K 3.8 10/26/2017   CL 107 10/26/2017   CO2 24 07/14/2017    Lab Results  Component Value Date   ALT 10 12/16/2016   AST 14 12/16/2016   ALKPHOS 60 12/16/2016   BILITOT 0.6 12/16/2016    Lab Results  Component Value Date   CHOL 143 08/21/2015   HDL 61 08/21/2015   LDLCALC 70 08/21/2015   TRIG 62 08/21/2015   CHOLHDL 2.3 08/21/2015   HIV 1 RNA Quant (copies/mL)  Date Value  11/19/2017 826 (H)  07/14/2017 56,800  12/16/2016 5,880 (H)   CD4 T Cell Abs (/uL)  Date Value  11/19/2017 40 (L)  07/14/2017 50 (L)  12/16/2016  40 (L)     Assessment & Plan:   Problem List Items Addressed This Visit      Other   AIDS ([place]) - Primary    Check viral load and CD4 today. If her CD4 is still < 50 after 2 months of therapy will add azithromycin weekly. Continue Biktarvy and Bactrim once a day. She is excited to see the results of her lab work so she can tell her partner. Return in 10 weeks for follow up.        Relevant Orders   HIV-1 RNA quant-no reflex-bld   T-helper cell (CD4)- ([facility] clinic only)   Anorexia    Improving subjectively but still with 1 lb weight loss since last visit. Recommended to follow at home. She reports no  trouble with access to food. Continue Megace.   Wt Readings from Last 3 Encounters:  01/05/18 96 lb (43.5 kg)  12/08/17 97 lb (44 kg)  09/29/17 100 lb (45.4 kg)         History of iron deficiency anemia    Reports heavy menses. Check iron panel today. Recently not found to be anemic - will check HgB today as well.  Lab Results  Component Value Date   WBC 3.0 (L) 10/26/2017   HGB 12.6 10/26/2017   HCT 37.0 10/26/2017   MCV 85.8 10/26/2017   PLT 31 (L) 10/26/2017         Relevant Orders   Iron, TIBC and Ferritin Panel   CBC   Routine health maintenance    Pap smear in 10 weeks when she returns.  Completed mammography scholarship fund today and orders entered for screening mammo.  Flu vaccine today.        Other Visit Diagnoses    Screening for breast cancer       Relevant Orders   MM Digital Screening     No orders of the defined types were placed in this encounter.  Return in about 10 weeks (around 03/16/2018) for follow up.  Janene Madeira, MSN, NP-C [facility] for Infectious [place] Pager: [phone] Office: [phone]  01/05/18  9:20 AM

## 2018-01-05 NOTE — Assessment & Plan Note (Signed)
Improving subjectively but still with 1 lb weight loss since last visit. Recommended to follow at home. She reports no trouble with access to food. Continue Megace.   Wt Readings from Last 3 Encounters:  01/05/18 96 lb (43.5 kg)  12/08/17 97 lb (44 kg)  09/29/17 100 lb (45.4 kg)

## 2018-01-05 NOTE — Assessment & Plan Note (Signed)
Reports heavy menses. Check iron panel today. Recently not found to be anemic - will check HgB today as well.  Lab Results  Component Value Date   WBC 3.0 (L) 10/26/2017   HGB 12.6 10/26/2017   HCT 37.0 10/26/2017   MCV 85.8 10/26/2017   PLT 31 (L) 10/26/2017

## 2018-01-05 NOTE — Patient Instructions (Addendum)
Always a pleasure to see you !   Will send your results of today's labs through [place].  Continue taking your Biktarvy and your Bactrim once a day. I am very hopeful that your viral load is undetectable today!   Will check your iron stores today.   Will work on getting your mammogram scheduled.  Please call the [facility] to set up your appointment if you have not heard in 2 weeks or so.  [phone] [place], [place] [address]  Please return in about 10 weeks to check in again and will do your pap smear at that visit (30 min visit please).

## 2018-01-05 NOTE — Assessment & Plan Note (Signed)
Pap smear in 10 weeks when she returns.  Completed mammography scholarship fund today and orders entered for screening mammo.  Flu vaccine today.

## 2018-01-06 ENCOUNTER — Other Ambulatory Visit: Payer: Self-pay | Admitting: Infectious Diseases

## 2018-01-06 DIAGNOSIS — D500 Iron deficiency anemia secondary to blood loss (chronic): Secondary | ICD-10-CM

## 2018-01-06 LAB — T-HELPER CELL (CD4) - (RCID CLINIC ONLY)
CD4 T CELL ABS: 80 /uL — AB (ref 400–2700)
CD4 T CELL HELPER: 3 % — AB (ref 33–55)

## 2018-01-06 NOTE — Progress Notes (Signed)
Jashiya's severely anemic again and her iron is extremely low. Called to discuss with her to start her on an iron supplement twice a day to treat this.  I asked her to please take this after she eats her lunch and after she eats dinner every day to separate it from her IXL by at least 4 hours. Unfortunately this is not covered through [place] - in looking at [employer] can get her 60 pills for about $10. Ideally would use Iron polysaccharide to minimize GI s/e but cannot afford $10 for a month's worth. She is going to find an iron supplement at the [employer] tree once she gets paid [place] and call us or send a MyChart with the kind she has. I also discussed with her that we should repeat her labs 2-3 weeks after starting (~10/16) to assess response. She may require IV iron with hematology referral.   She understands directions and will call next week.

## 2018-01-07 LAB — CBC
HEMATOCRIT: 30.8 % — AB (ref 35.0–45.0)
Hemoglobin: 9 g/dL — ABNORMAL LOW (ref 11.7–15.5)
MCH: 20.1 pg — ABNORMAL LOW (ref 27.0–33.0)
MCHC: 29.2 g/dL — ABNORMAL LOW (ref 32.0–36.0)
MCV: 68.8 fL — AB (ref 80.0–100.0)
MPV: 10.9 fL (ref 7.5–12.5)
Platelets: 496 10*3/uL — ABNORMAL HIGH (ref 140–400)
RBC: 4.48 10*6/uL (ref 3.80–5.10)
RDW: 17.3 % — AB (ref 11.0–15.0)
WBC: 6.7 10*3/uL (ref 3.8–10.8)

## 2018-01-07 LAB — IRON,TIBC AND FERRITIN PANEL
%SAT: 3 % (calc) — ABNORMAL LOW (ref 16–45)
Ferritin: 5 ng/mL — ABNORMAL LOW (ref 16–232)
Iron: 16 ug/dL — ABNORMAL LOW (ref 40–190)
TIBC: 473 mcg/dL (calc) — ABNORMAL HIGH (ref 250–450)

## 2018-01-07 LAB — HIV-1 RNA QUANT-NO REFLEX-BLD
HIV 1 RNA Quant: 33 copies/mL — ABNORMAL HIGH
HIV-1 RNA Quant, Log: 1.52 Log copies/mL — ABNORMAL HIGH

## 2018-01-18 ENCOUNTER — Other Ambulatory Visit (HOSPITAL_COMMUNITY): Payer: Self-pay | Admitting: *Deleted

## 2018-01-18 DIAGNOSIS — Z1231 Encounter for screening mammogram for malignant neoplasm of breast: Secondary | ICD-10-CM

## 2018-01-26 ENCOUNTER — Other Ambulatory Visit: Payer: Self-pay | Admitting: *Deleted

## 2018-01-26 ENCOUNTER — Telehealth: Payer: Self-pay | Admitting: *Deleted

## 2018-01-26 MED FILL — BIKTARVY 50-200-25 MG TABS: 50-200-25 | 30 days supply | Qty: 30 | Fill #2

## 2018-01-26 NOTE — Telephone Encounter (Signed)
RN contacted the patient today and left a message stating stating my name and that I want to ensure al is going well, offered assistance if needed. Last viral load looks amazing at 41 which I am excited about. I would also like to be sure she is taking her iron supplement.  Shirley White responded stating she just lost her job and wondered if I could help. Shirley White also stated she never stated on her iron either. Offer to assist with both.   Frequency / Duration of [facility]N visits: Effective 01/29/18: 79mo1, 30mo3   4 PRN's for complications with disease process/progression, medication changes or concerns   CBHCN will assess for learning needs related to diagnosis and treatment regimen, provide education as needed, fill pill box if needed, address any barriers which may be preventing medication compliance, and communicating with care team including physician and case manager.   Individualized [place] Period from 01/29/18 to 04/29/18  a. Type of service(s) and care to be delivered: RN Case Management  b. Frequency and duration of service: Effective 01/29/18: 59mo1, 65mo3 4 prns  for complications with disease process/progression, medication changes or concerns . Visits/Contact may be conducted telephonically or in person to best suit the patient. Patient just lost her job at [name]  c. Activity restrictions: Pt may be up as tolerated and can safely  ambulate without the need for a assistive device   d. Safety Measures: Standard Precautions/Infection Control   e. Service Objectives and Goals: Service Objectives are to assist the pt with HIV medication regimen adherence and staying in care with the Infectious Disease Clinic by identifying barriers to care. RN will address the barriers that are identified by the patient. Patient current states she is sick and would like to get her medications.   f. Equipment required: No additional equipment needs at this time   g. Functional Limitations: None  Noted   h. Rehabilitation potential: Guarded   i. Diet and Nutritional Needs: Regular Diet   j. Medications and treatments: Medications have been reconciled and reviewed and are a part of EPIC electronic file   k. Specific therapies if needed: RN   l. Pertinent diagnoses: HIV disease,  Hx of medication NonCompliance, Iron Deficiency Anemia  m. Expected outcome: Guarded

## 2018-01-26 NOTE — Telephone Encounter (Signed)
I accept this POC as outlined

## 2018-02-26 MED FILL — BIKTARVY 50-200-25 MG TABS: 50-200-25 | 30 days supply | Qty: 30 | Fill #3

## 2018-03-22 ENCOUNTER — Encounter: Payer: Self-pay | Admitting: Infectious Diseases

## 2018-03-22 ENCOUNTER — Ambulatory Visit (INDEPENDENT_AMBULATORY_CARE_PROVIDER_SITE_OTHER): Payer: Self-pay | Admitting: Infectious Diseases

## 2018-03-22 VITALS — BP 138/91 | HR 80 | Temp 98.0°F | Ht 61.0 in | Wt 109.0 lb

## 2018-03-22 DIAGNOSIS — N898 Other specified noninflammatory disorders of vagina: Secondary | ICD-10-CM

## 2018-03-22 DIAGNOSIS — Z8742 Personal history of other diseases of the female genital tract: Secondary | ICD-10-CM

## 2018-03-22 DIAGNOSIS — Z23 Encounter for immunization: Secondary | ICD-10-CM

## 2018-03-22 DIAGNOSIS — Z124 Encounter for screening for malignant neoplasm of cervix: Secondary | ICD-10-CM

## 2018-03-22 DIAGNOSIS — Z0000 Encounter for general adult medical examination without abnormal findings: Secondary | ICD-10-CM

## 2018-03-22 DIAGNOSIS — B20 Human immunodeficiency virus [HIV] disease: Secondary | ICD-10-CM

## 2018-03-22 DIAGNOSIS — D500 Iron deficiency anemia secondary to blood loss (chronic): Secondary | ICD-10-CM

## 2018-03-22 NOTE — Assessment & Plan Note (Signed)
R/T heavy menstrual cycles. She has been supplementing with iron 1-2 times daily. Will repeat iron panel and CBC today.

## 2018-03-22 NOTE — Progress Notes (Signed)
Name: Shirley White  DOB: 01-Aug-1970 MRN: [id number] PCP: Neva Seat, MD   Patient Active Problem List   Diagnosis Date Noted  . Vaginal discharge 03/22/2018  . Anorexia 01/05/2018  . Thrombocytopenia ([place]) 12/12/2017  . Routine health maintenance 08/28/2016  . AIDS ([place]) 02/02/2016  . H/O menorrhagia 09/05/2014  . Iron deficiency anemia 09/05/2014  . Sickle-cell trait ([place]) 06/04/2006     Brief Narrative:  Shirley White is a 47 y.o. woman with HIV disease originally diagnosed in 2005. Poor adherence with AIDS qualifying CD4s since 2016 with CD4 nadir 30. HIV Risk: heterosexual. History of OIs: esophageal candidiasis.   Previous Regimens: . Complera 2011 >> difficulty swallowing regimen . Emtriva + Edurant + Viread 2011 through 01-2014 . Odefsey 2016 . Tiviay + Truvada 2018   Genotypes: . 07-5036 - sensitive . 11-2016 - sensitive   Subjective:  Shirley White is here today for follow up on HIV, iron deficiency anemia and pap smear.   HPI/ROS:  She is doing well today and has no complaints. Very happy about her weight gain (up to 109 form 93 lbs) with the megace and getting back on Biktarvy. She estimates no missed doses since last visit and her and her boyfriend Darnelle Maffucci take their HIV medications together. She is also still taking Bactrim once daily. She is sexually active with one partner, no condom use. She has a h/o BTL for contraception management. She has a mammogram scheduled soon. No history of abnormal pap smears in the past. She still complains of heavy monthly periods. Has been taking her iron supplements like I asked her to. No c/o constipation. Denies fatigue.    Review of Systems  Constitutional: Negative for chills, fever, malaise/fatigue and weight loss.  HENT: Negative for sore throat.   Respiratory: Negative for cough, sputum production and shortness of breath.   Cardiovascular: Negative.   Gastrointestinal: Negative for abdominal pain,  diarrhea and vomiting.  Musculoskeletal: Negative for joint pain, myalgias and neck pain.  Skin: Negative for rash.  Neurological: Negative for headaches.  Psychiatric/Behavioral: Negative for depression and substance abuse. The patient is not nervous/anxious.     Past Medical History:  Diagnosis Date  . Anemia   . GERD (gastroesophageal reflux disease)   . HIV disease ([place])   . Hypertension     Outpatient Medications Prior to Visit  Medication Sig Dispense Refill  . bictegravir-emtricitabine-tenofovir AF (BIKTARVY) 50-200-25 MG TABS tablet Take 1 tablet by mouth daily. 30 tablet 5  . ferrous sulfate 325 (65 FE) MG EC tablet Take 325 mg by mouth 2 (two) times daily with a meal.    . hydrOXYzine (ATARAX/VISTARIL) 25 MG tablet Take 1 tablet (25 mg total) by mouth every 6 (six) hours as needed for itching. 12 tablet 0  . megestrol (MEGACE) 400 MG/10ML suspension Take 10 mLs (400 mg total) by mouth daily. 240 mL 5  . senna-docusate (SENOKOT-S) 8.6-50 MG tablet Take 1 tablet by mouth at bedtime as needed for mild constipation. 14 tablet 0  . sulfamethoxazole-trimethoprim (BACTRIM,SEPTRA) 400-80 MG tablet Take 1 tablet by mouth daily. 30 tablet 5   No facility-administered medications prior to visit.      No Known Allergies  Social History   Tobacco Use  . Smoking status: Never Smoker  . Smokeless tobacco: Never Used  Substance Use Topics  . Alcohol use: Yes    Alcohol/week: 1.0 standard drinks    Types: 1 Cans of beer per week  Comment: occa  . Drug use: No    Frequency: 7.0 times per week    Types: Marijuana    Social History   Substance and Sexual Activity  Sexual Activity Yes  . Partners: Male  . Birth control/protection: Condom   Comment: given condoms     Objective:   Vitals:   03/22/18 1018  BP: (!) 138/91  Pulse: 80  Temp: 98 F (36.7 C)  Weight: 109 lb (49.4 kg)  Height: 5\' 1"  (1.549 m)   Body mass index is 20.6 kg/m.  Physical Exam    Constitutional: She is oriented to person, place, and time. She appears well-developed.  Well appearing. Seated comfortably in chair today during visit.   HENT:  Mouth/Throat: Mucous membranes are normal. No oral lesions. Normal dentition. No dental abscesses. No oropharyngeal exudate.  Cardiovascular: Normal rate and regular rhythm.  No murmur heard. Pulmonary/Chest: Effort normal and breath sounds normal. No respiratory distress.  Abdominal: Soft. She exhibits no distension. There is no tenderness.  Genitourinary: Cervix exhibits no motion tenderness (not well visualized.). No bleeding in the vagina. Vaginal discharge (white, milky, no significant odor) found.  Lymphadenopathy:    She has no cervical adenopathy.  Neurological: She is alert and oriented to person, place, and time.  Skin: Skin is warm and dry. No rash noted.  Psychiatric: She has a normal mood and affect. Judgment normal.  In good spirits today and engaged in care discussion    Lab Results Lab Results  Component Value Date   WBC 6.7 01/05/2018   HGB 9.0 (L) 01/05/2018   HCT 30.8 (L) 01/05/2018   MCV 68.8 (L) 01/05/2018   PLT 496 (H) 01/05/2018    Lab Results  Component Value Date   CREATININE 0.30 (L) 10/26/2017   BUN 6 10/26/2017   NA 139 10/26/2017   K 3.8 10/26/2017   CL 107 10/26/2017   CO2 24 07/14/2017    Lab Results  Component Value Date   ALT 10 12/16/2016   AST 14 12/16/2016   ALKPHOS 60 12/16/2016   BILITOT 0.6 12/16/2016    Lab Results  Component Value Date   CHOL 143 08/21/2015   HDL 61 08/21/2015   LDLCALC 70 08/21/2015   TRIG 62 08/21/2015   CHOLHDL 2.3 08/21/2015   HIV 1 RNA Quant (copies/mL)  Date Value  01/05/2018 33 (H)  11/19/2017 826 (H)  07/14/2017 56,800   CD4 T Cell Abs (/uL)  Date Value  01/05/2018 80 (L)  11/19/2017 40 (L)  07/14/2017 50 (L)     Assessment & Plan:   Problem List Items Addressed This Visit      Unprioritized   AIDS ([place]) (Chronic)    HIV  1 RNA Quant (copies/mL)  Date Value  01/05/2018 33 (H)  11/19/2017 826 (H)  07/14/2017 56,800   CD4 T Cell Abs (/uL)  Date Value  01/05/2018 80 (L)  11/19/2017 40 (L)  07/14/2017 50 (L)   She has done very well with consistent use of her Biktarvy. I told her I am very proud of her. Will repeat CD4 and VL today. She will continue daily Biktarvy and Bactrim use until our next appointment in 3 months with repeat VL/CD4 and RPR. Discussed U=U concept in addition to safe sex counseling and prevention of other STIs. Declined condoms.       Relevant Orders   HIV-1 RNA quant-no reflex-bld   T-helper cell (CD4)- ([facility] clinic only)   HIV-1 RNA quant-no reflex-bld  T-helper cell (CD4)- ([facility] clinic only)   RPR   H/O menorrhagia    Still ongoing heavy periods requiring transfusions in the past. Interested in discussion with GYN regarding contraceptive options to help reduce this. Discussed Mirena today specifically. She does not need the contraception (h/o BTL) only menses management.       Relevant Orders   Ambulatory referral to Obstetrics / Gynecology   Iron deficiency anemia - Primary    R/T heavy menstrual cycles. She has been supplementing with iron 1-2 times daily. Will repeat iron panel and CBC today.       Relevant Orders   Iron, TIBC and Ferritin Panel   CBC   CBC with Differential/Platelet   Routine health maintenance    Menveo #2 today.  Age excluding for HPV vaccine series.  Otherwise up to date for vaccines for PLWH.  She is in care with dental clinic.        Vaginal discharge    White, milky discharge in vaginal vault on external genitalia noted. She does not report this to be abnormal for her. May be she is going to start her menses soon or bacterial vaginosis. She has no cervical friability or CMT although difficult to visualize. Will check GC/C, BV and trichomoniasis today.        Other Visit Diagnoses    Screening for cervical cancer       Relevant Orders    Cytology - PAP( [place])     Return in about 3 months (around 06/21/2018) for labs, follow up.  Janene Madeira, MSN, NP-C [facility] for Infectious [place] Pager: [phone] Office: [phone]  03/22/18  10:54 AM

## 2018-03-22 NOTE — Addendum Note (Signed)
Addended by: Eugenia Mcalpine on: 03/22/2018 02:24 PM   Modules accepted: Orders

## 2018-03-22 NOTE — Assessment & Plan Note (Signed)
Menveo #2 today.  Age excluding for HPV vaccine series.  Otherwise up to date for vaccines for PLWH.  She is in care with dental clinic.

## 2018-03-22 NOTE — Assessment & Plan Note (Signed)
White, milky discharge in vaginal vault on external genitalia noted. She does not report this to be abnormal for her. May be she is going to start her menses soon or bacterial vaginosis. She has no cervical friability or CMT although difficult to visualize. Will check GC/C, BV and trichomoniasis today.

## 2018-03-22 NOTE — Assessment & Plan Note (Signed)
HIV 1 RNA Quant (copies/mL)  Date Value  01/05/2018 33 (H)  11/19/2017 826 (H)  07/14/2017 56,800   CD4 T Cell Abs (/uL)  Date Value  01/05/2018 80 (L)  11/19/2017 40 (L)  07/14/2017 50 (L)   She has done very well with consistent use of her Biktarvy. I told her I am very proud of her. Will repeat CD4 and VL today. She will continue daily Biktarvy and Bactrim use until our next appointment in 3 months with repeat VL/CD4 and RPR. Discussed U=U concept in addition to safe sex counseling and prevention of other STIs. Declined condoms.

## 2018-03-22 NOTE — Patient Instructions (Addendum)
You look wonderful today!  Will check your lab work today and call you with results.   Continue your bactrim and your biktarvy once a day.   Continue taking your iron supplement twice a day 4 hours apart from your biktarvy [facility] in the morning when you wake up, 1st iron pill after you eat lunch, 2nd iron pill after dinner).   Will see you back in 3 months with labs prior to your visit.

## 2018-03-22 NOTE — Assessment & Plan Note (Signed)
Still ongoing heavy periods requiring transfusions in the past. Interested in discussion with GYN regarding contraceptive options to help reduce this. Discussed Mirena today specifically. She does not need the contraception (h/o BTL) only menses management.

## 2018-03-23 LAB — T-HELPER CELL (CD4) - (RCID CLINIC ONLY)
CD4 % Helper T Cell: 5 % — ABNORMAL LOW (ref 33–55)
CD4 T CELL ABS: 150 /uL — AB (ref 400–2700)

## 2018-03-24 LAB — CBC
HCT: 28.5 % — ABNORMAL LOW (ref 35.0–45.0)
HEMOGLOBIN: 8.3 g/dL — AB (ref 11.7–15.5)
MCH: 18.9 pg — ABNORMAL LOW (ref 27.0–33.0)
MCHC: 29.1 g/dL — AB (ref 32.0–36.0)
MCV: 64.8 fL — ABNORMAL LOW (ref 80.0–100.0)
MPV: 10.6 fL (ref 7.5–12.5)
Platelets: 537 10*3/uL — ABNORMAL HIGH (ref 140–400)
RBC: 4.4 10*6/uL (ref 3.80–5.10)
RDW: 21.1 % — ABNORMAL HIGH (ref 11.0–15.0)
WBC: 6.5 10*3/uL (ref 3.8–10.8)

## 2018-03-24 LAB — IRON,TIBC AND FERRITIN PANEL
%SAT: 4 % (calc) — ABNORMAL LOW (ref 16–45)
Ferritin: 4 ng/mL — ABNORMAL LOW (ref 16–232)
IRON: 16 ug/dL — AB (ref 40–190)
TIBC: 455 ug/dL — AB (ref 250–450)

## 2018-03-24 LAB — HIV-1 RNA QUANT-NO REFLEX-BLD
HIV 1 RNA Quant: <20 copies/mL — AB
HIV-1 RNA QUANT, LOG: DETECTED {Log_copies}/mL — AB

## 2018-03-24 NOTE — Addendum Note (Signed)
Addended by: [place] Callas on: 03/24/2018 11:47 AM   Modules accepted: Orders

## 2018-03-24 NOTE — Progress Notes (Signed)
Viral load undetectable - she is doing excellent on Biktarvy. Continue Bactrim for 3 more months. Would like to get her referred to hematology; her stores are very low and would likely benefit from iron infusion. Continue iron pill after eating lunch and dinner for now.

## 2018-03-25 LAB — CYTOLOGY - PAP
Chlamydia: NEGATIVE
HPV (WINDOPATH): NOT DETECTED
Neisseria Gonorrhea: NEGATIVE

## 2018-03-25 MED ORDER — METRONIDAZOLE 500 MG PO TABS: 2000.0000 mg | ORAL_TABLET | Freq: Once | ORAL | 0 refills | Status: AC

## 2018-03-25 NOTE — Addendum Note (Signed)
Addended by: [place] Callas on: 03/25/2018 08:57 PM   Modules accepted: Orders

## 2018-03-25 NOTE — Progress Notes (Signed)
Please call Shirley White to let her know that her pap smear returned with some abnormal cells - we need to repeat this test in 12 months to follow for any changes.   She also did return positive for trichomonas - we discussed abnormal discharge during our visit. She will need to take metronidazole 2000 mg x 1 dose. Her boyfriend needs to be [facility] as [facility]. They need to abstain from sex until both have been treated and for 7 days after.   Thank you!

## 2018-03-30 ENCOUNTER — Telehealth: Payer: Self-pay

## 2018-03-30 MED FILL — BIKTARVY 50-200-25 MG TABS: 50-200-25 | 30 days supply | Qty: 30 | Fill #4

## 2018-03-30 NOTE — Telephone Encounter (Signed)
Patient informed of pap results/ new one time medication and notes below per Janene Madeira, NP. Patient appreciative of phone call.

## 2018-03-30 NOTE — Telephone Encounter (Signed)
-----   Message from Ackerly Callas, NP sent at 03/25/2018  8:55 PM EST ----- Please call Shirley White to let her know that her pap smear returned with some abnormal cells - we need to repeat this test in 12 months to follow for any changes.   She also did return positive for trichomonas - we discussed abnormal discharge during our visit. She will need to take metronidazole 2000 mg x 1 dose. Her boyfriend needs to be treated as well [facility]. They need to abstain from sex until both have been treated and for 7 days after.   Thank you!

## 2018-04-01 ENCOUNTER — Ambulatory Visit (HOSPITAL_COMMUNITY)
Admission: RE | Admit: 2018-04-01 | Discharge: 2018-04-01 | Disposition: A | Discharge status: Home / Self Care | Payer: Self-pay | Source: Ambulatory Visit | Attending: Obstetrics and Gynecology | Admitting: Obstetrics and Gynecology

## 2018-04-01 ENCOUNTER — Other Ambulatory Visit (HOSPITAL_COMMUNITY): Payer: Self-pay | Admitting: *Deleted

## 2018-04-01 ENCOUNTER — Ambulatory Visit: Payer: Self-pay

## 2018-04-01 ENCOUNTER — Encounter (HOSPITAL_COMMUNITY): Payer: Self-pay

## 2018-04-01 VITALS — BP 124/80 | Wt 105.0 lb

## 2018-04-01 DIAGNOSIS — N6314 Unspecified lump in the right breast, lower inner quadrant: Secondary | ICD-10-CM

## 2018-04-01 DIAGNOSIS — N6310 Unspecified lump in the right breast, unspecified quadrant: Secondary | ICD-10-CM

## 2018-04-01 DIAGNOSIS — Z1239 Encounter for other screening for malignant neoplasm of breast: Secondary | ICD-10-CM

## 2018-04-01 DIAGNOSIS — R87612 Low grade squamous intraepithelial lesion on cytologic smear of cervix (LGSIL): Secondary | ICD-10-CM

## 2018-04-01 NOTE — Progress Notes (Signed)
Patient referred to [facility] by [facility] due to having an abnormal Pap smear on 03/22/2018 that a colposcopy is recommended for follow-up.  Pap Smear: Pap smear not completed today. 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 Referred patient to the [facility] [place] [facility] for a colposcopy to follow-up for her abnormal Pap smear. Appointment is scheduled for Thursday, April 29, 2017 at 0855. Per patient has a history of an abnormal Pap smear 2-years ago that a colposcopy was completed for follow-up. Last Pap smear result is in Epic.  Physical exam: Breasts Breasts symmetrical. No skin abnormalities bilateral breasts. No nipple retraction bilateral breasts. No nipple discharge bilateral breasts. No lymphadenopathy. No lumps palpated left breast. Palpated a pea sized lump within the right breast at 4 o'clock 2 cm from the nipple. No complaints of pain or tenderness on exam. Referred patient to the [place] for a diagnostic mammogram and right breast ultrasound. Appointment scheduled for [place], April 09, 2018 at 1010.        Pelvic/Bimanual No Pap smear completed today since last Pap smear and HPV typing was 03/22/2018. Pap smear not indicated per BCCCP guidelines.   Smoking History: Patient has never smoked.  Patient Navigation: Patient education provided. Access to services provided for patient through BCCCP program.   Breast and Cervical Cancer Risk Assessment: Patient has no family history of breast cancer, known genetic mutations, or radiation treatment to the chest before age 75. Per patient has a history of cervical dysplasia. Patient has a history of being immunocompromised. Patient has a history of being HIV positive. Patient has no history of DES exposure in-utero.  Risk Assessment    Risk Scores      04/01/2018   Last edited by: Armond Hang, LPN   5-year risk: 0.9 %   Lifetime risk: 9.2 %

## 2018-04-01 NOTE — Patient Instructions (Signed)
Explained breast self awareness with Tanique Matney Aure. Patient did not need a Pap smear today due to last Pap smear was 03/22/2018. Explained the colposcopy the recommended follow-up for her abnormal Pap smear. Referred patient to the [facility] [place] [facility] for a colposcopy to follow-up for her abnormal Pap smear. Appointment is scheduled for Thursday, April 29, 2017 at 0855. Referred patient to the [facility] for a diagnostic mammogram and right breast ultrasound. Appointment scheduled for Chery, April 09, 2018 at 1010. Patient aware of appointments and will be there. Fallan Mccarey Nevers verbalized understanding.  Les Longmore, Arvil Chaco, RN 12:27 PM

## 2018-04-02 ENCOUNTER — Encounter: Payer: Self-pay | Admitting: Internal Medicine

## 2018-04-02 ENCOUNTER — Telehealth: Payer: Self-pay | Admitting: Internal Medicine

## 2018-04-02 NOTE — Telephone Encounter (Signed)
New referral received from Janene Madeira, NP for [facility]. Pt has been cld and scheduled to see Dr. Walden Field on 1/17 at 950am. Pt agreed to the appt date and time. Letter mailed.

## 2018-04-09 ENCOUNTER — Other Ambulatory Visit (HOSPITAL_COMMUNITY): Payer: Self-pay | Admitting: Obstetrics and Gynecology

## 2018-04-09 ENCOUNTER — Ambulatory Visit
Admission: RE | Admit: 2018-04-09 | Discharge: 2018-04-09 | Disposition: A | Discharge status: Home / Self Care | Payer: PRIVATE HEALTH INSURANCE | Source: Ambulatory Visit | Attending: Obstetrics and Gynecology | Admitting: Obstetrics and Gynecology

## 2018-04-09 ENCOUNTER — Ambulatory Visit
Admission: RE | Admit: 2018-04-09 | Discharge: 2018-04-09 | Disposition: A | Discharge status: Home / Self Care | Payer: Self-pay | Source: Ambulatory Visit | Attending: Obstetrics and Gynecology | Admitting: Obstetrics and Gynecology

## 2018-04-09 ENCOUNTER — Telehealth: Payer: Self-pay | Admitting: *Deleted

## 2018-04-09 DIAGNOSIS — N6310 Unspecified lump in the right breast, unspecified quadrant: Secondary | ICD-10-CM

## 2018-04-09 NOTE — Telephone Encounter (Signed)
Contacted Katrenia but unable to reach her by phone at this time.

## 2018-04-13 ENCOUNTER — Encounter (HOSPITAL_COMMUNITY): Payer: Self-pay | Admitting: *Deleted

## 2018-04-13 ENCOUNTER — Telehealth: Payer: Self-pay

## 2018-04-13 NOTE — Telephone Encounter (Signed)
Called pt to explain why she was scheduled a Drs Appt with Dr. Rip Harbour on 04/15/17. Pt states will not be able to make that day, needs to reschedule if possible. Advised some one will be calling her to reschedule. Pt. Verbalized understanding.

## 2018-04-14 HISTORY — PX: BREAST BIOPSY: SHX20

## 2018-04-15 ENCOUNTER — Encounter: Payer: Self-pay | Admitting: Obstetrics and Gynecology

## 2018-04-19 ENCOUNTER — Ambulatory Visit
Admission: RE | Admit: 2018-04-19 | Discharge: 2018-04-19 | Disposition: A | Discharge status: Home / Self Care | Payer: PRIVATE HEALTH INSURANCE | Source: Ambulatory Visit | Attending: Obstetrics and Gynecology | Admitting: Obstetrics and Gynecology

## 2018-04-19 ENCOUNTER — Other Ambulatory Visit (HOSPITAL_COMMUNITY): Payer: Self-pay | Admitting: Obstetrics and Gynecology

## 2018-04-19 DIAGNOSIS — N6310 Unspecified lump in the right breast, unspecified quadrant: Secondary | ICD-10-CM

## 2018-04-29 ENCOUNTER — Ambulatory Visit (INDEPENDENT_AMBULATORY_CARE_PROVIDER_SITE_OTHER): Payer: PRIVATE HEALTH INSURANCE | Admitting: Obstetrics & Gynecology

## 2018-04-29 ENCOUNTER — Encounter: Payer: Self-pay | Admitting: Obstetrics & Gynecology

## 2018-04-29 ENCOUNTER — Other Ambulatory Visit (HOSPITAL_COMMUNITY)
Admission: RE | Admit: 2018-04-29 | Discharge: 2018-04-29 | Disposition: A | Discharge status: Home / Self Care | Payer: PRIVATE HEALTH INSURANCE | Source: Ambulatory Visit | Attending: Obstetrics & Gynecology | Admitting: Obstetrics & Gynecology

## 2018-04-29 DIAGNOSIS — A599 Trichomoniasis, unspecified: Secondary | ICD-10-CM

## 2018-04-29 DIAGNOSIS — R87612 Low grade squamous intraepithelial lesion on cytologic smear of cervix (LGSIL): Secondary | ICD-10-CM | POA: Insufficient documentation

## 2018-04-29 LAB — POCT PREGNANCY, URINE: Preg Test, Ur: NEGATIVE

## 2018-04-29 MED FILL — BIKTARVY 50-200-25 MG TABS: 50-200-25 | 30 days supply | Qty: 30 | Fill #5

## 2018-04-29 NOTE — Progress Notes (Signed)
   Subjective:    Patient ID: Annisten Manchester Lerew, female    DOB: 12/23/1970, 48 y.o.   MRN: [id number]  HPI 48 yo engaged P4 here for a colpo due to a pap at [facility] 12/19 that showed LGSIL. She gives a distant h/o of having a colpo done in [facility]. She does not recall any treatment. She is HIV +.  She (and her partner) were treated for trich last month, but no TOC has been done yet.   Review of Systems She has had a BTL.    Objective:   Physical Exam Breathing, conversing, and ambulating normally Well nourished, well hydrated Black female, no apparent distress UPT negative, consent signed, time out done Cervix prepped with acetic acid. Transformation zone seen in its entirety. Colpo adequate. 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 I biopsied a lesion at the 5 o'clock position. Silver nitrate achieved hemostasis. ECC obtained. She tolerated the procedure well.      Assessment & Plan:  LGSIL- await pathology I have sent Dr Denman George a message about her treatment suggestions  She will watch the LEEP video today. Come back 3 weeks

## 2018-04-30 ENCOUNTER — Telehealth: Payer: Self-pay | Admitting: *Deleted

## 2018-04-30 ENCOUNTER — Inpatient Hospital Stay: Payer: PRIVATE HEALTH INSURANCE | Admitting: Internal Medicine

## 2018-04-30 LAB — CERVICOVAGINAL ANCILLARY ONLY
Bacterial vaginitis: POSITIVE — AB
Candida vaginitis: NEGATIVE
Chlamydia: NEGATIVE
Neisseria Gonorrhea: NEGATIVE
Trichomonas: POSITIVE — AB

## 2018-05-03 ENCOUNTER — Other Ambulatory Visit: Payer: Self-pay | Admitting: Obstetrics & Gynecology

## 2018-05-03 MED ORDER — METRONIDAZOLE 500 MG PO TABS: 500.0000 mg | ORAL_TABLET | Freq: Two times a day (BID) | ORAL | 0 refills | Status: DC

## 2018-05-03 NOTE — Telephone Encounter (Signed)
RN contacted the patient today and left a message stating stating my name and that I want to ensure al is going well, offered assistance if needed. Last viral load looks amazing at 53 which I am excited about. I would also like to be sure she is taking her iron supplement.  Rut responded stating she just lost her job and wondered if I could help. Dianelys also stated she never stated on her iron either. Offer to assist with both.   Frequency / Duration of [facility] visits: Effective 04/30/18: 87mo4   4 PRN's for complications with disease process/progression, medication changes or concerns   [facility] will assess for learning needs related to diagnosis and treatment regimen, provide education as needed, fill pill box if needed, address any barriers which may be preventing medication compliance, and communicating with care team including physician and case manager.   Individualized [place] Period from 04/30/18 to 07/29/18  a. Type of service(s) and care to be delivered: RN Case Management  b. Frequency and duration of service: Effective 04/30/18 : 29mo4,  4 prns for complications with disease process/progression, medication changes or concerns . Visits/Contact may be conducted telephonically or in person to best suit the patient.  c. Activity restrictions: Pt may be up as tolerated and can safely  ambulate without the need for a assistive device   d. Safety Measures: Standard Precautions/Infection Control   e. Service Objectives and Goals: Service Objectives are to assist the pt with HIV medication regimen adherence and staying in care with the Infectious Disease Clinic by identifying barriers to care. RN will address the barriers that are identified by the patient. Patient current states she is sick and would like to get her medications. Patient lost her job at [name] and is looking for new employments. She is now in a relationship that supports and encourages her medication  adherence.  f. Equipment required: No additional equipment needs at this time   g. Functional Limitations: None Noted   h. Rehabilitation potential: Guarded   i. Diet and Nutritional Needs: Regular Diet   j. Medications and treatments: Medications have been reconciled and reviewed and are a part of EPIC electronic file   k. Specific therapies if needed: RN   l. Pertinent diagnoses: HIV disease,  Hx of medication NonCompliance, Iron Deficiency Anemia  m. Expected outcome: Guarded

## 2018-05-03 NOTE — Progress Notes (Unsigned)
Flagyl 500 mg BID for 7 days for BV and Trich.

## 2018-05-04 ENCOUNTER — Telehealth: Payer: Self-pay

## 2018-05-04 NOTE — Telephone Encounter (Signed)
-----   Message from Shirley Filbert, MD sent at 05/03/2018  7:03 AM EST ----- Please let her know that I have prescribed flagyl. She has BV and STILL has trich. Her partner will need to seek treatment. She should always use a condom!

## 2018-05-04 NOTE — Telephone Encounter (Signed)
I approve the POC as outlined

## 2018-05-04 NOTE — Telephone Encounter (Signed)
Called pt to advise of test results ,that she has BV & Trich, Rx Flagyl was sent to her pharmacy on file. Advised Partner needs to be Tx also, no sex for 2 weeks until after the last person Tx. Always use Condoms. Pt verbalized understanding.

## 2018-05-05 ENCOUNTER — Encounter: Payer: Self-pay | Admitting: Obstetrics & Gynecology

## 2018-05-19 ENCOUNTER — Telehealth: Payer: Self-pay

## 2018-05-19 MED ORDER — BENZONATATE 200 MG PO CAPS: 200.0000 mg | ORAL_CAPSULE | Freq: Three times a day (TID) | ORAL | 0 refills | Status: DC | PRN

## 2018-05-19 MED ORDER — POLYMYXIN B-TRIMETHOPRIM 10000-0.1 UNIT/ML-% OP SOLN: 1.0000 [drp] | OPHTHALMIC | 0 refills | Status: DC

## 2018-05-19 NOTE — Telephone Encounter (Signed)
Patient made aware of recommendations and medications sent to pharmacy. Patient very appreciative of response.  Eugenia Mcalpine, LPN

## 2018-05-19 NOTE — Addendum Note (Signed)
Addended by:  Callas on: 05/19/2018 12:50 PM   Modules accepted: Orders

## 2018-05-19 NOTE — Telephone Encounter (Signed)
Patient called states she has an ongoing cough with phlegm/congestion and right eye irritation for about three days. Denies fevers. Patient states she has been using mucinex and over the counter eye drops for three days with minimal relief. Encouraged patient to increase water intake and try OTC cough medication as well. Patient requesting cough medication and eye drops. Routing to NP for advise. Offered patient appointment but patient preferred something to be called into pharmacy uses [facility] on [address]. Eugenia Mcalpine, LPN

## 2018-05-19 NOTE — Telephone Encounter (Signed)
Sorry to hear she is feeling poorly. It sounds like she has a viral chest cold "aka the crud."   I have sent in some Tessalon Pearls for her - she can take these day or night.  For her eye will send in polytrim drops to see if this helps her. They will not be covered under UMAP program and will be out of pocket for cost. I would also ask her to use the drops since these eye infections often spread easily.   It may be related to viral process going on, however; which means the antibiotic drops may not work. Would encourage good hand washing and hands off her face. No re-using/sharing facial towels.    Other OTC Stuff -   Please encourage her to continue the mucinex but try the Mucinex-D--this has sudafed in it to help decongest her.   Benadryl 1-2 tablets at night to help with sleep/drainage down throat.   Flonase nasal spray is also an over the counter option to help - will take several days for full effect.   Vicks Vapor rub can be helpful and soothing as well.   My favorite cough remedy is mixing a teaspoon of honey with Delsym . The honey not only tastes good it is soothing for coughing.

## 2018-05-20 ENCOUNTER — Ambulatory Visit: Payer: PRIVATE HEALTH INSURANCE | Admitting: Obstetrics and Gynecology

## 2018-05-20 ENCOUNTER — Other Ambulatory Visit: Payer: Self-pay | Admitting: General Surgery

## 2018-05-20 DIAGNOSIS — R928 Other abnormal and inconclusive findings on diagnostic imaging of breast: Secondary | ICD-10-CM

## 2018-05-21 ENCOUNTER — Other Ambulatory Visit: Payer: Self-pay | Admitting: Pharmacist

## 2018-05-21 DIAGNOSIS — B20 Human immunodeficiency virus [HIV] disease: Secondary | ICD-10-CM

## 2018-05-24 ENCOUNTER — Ambulatory Visit: Payer: Self-pay | Admitting: Obstetrics & Gynecology

## 2018-05-24 ENCOUNTER — Encounter: Payer: Self-pay | Admitting: Obstetrics & Gynecology

## 2018-05-24 VITALS — BP 152/88 | HR 81 | Wt 101.2 lb

## 2018-05-24 DIAGNOSIS — N870 Mild cervical dysplasia: Secondary | ICD-10-CM

## 2018-05-24 DIAGNOSIS — B20 Human immunodeficiency virus [HIV] disease: Secondary | ICD-10-CM

## 2018-05-24 NOTE — Progress Notes (Signed)
   Subjective:    Patient ID: Analyce Tavares White, female    DOB: Aug 30, 1970, 48 y.o.   MRN: [id number]  HPI 48 yo engaged P4 here to discuss her pathology. She was seen here for a colpo on 04/29/18 due to a LGSIL pap done at [facility] 12/19. 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 She is HIV +.   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 Denman George in 6 months.

## 2018-06-01 ENCOUNTER — Telehealth: Payer: Self-pay | Admitting: Hematology

## 2018-06-01 MED FILL — BIKTARVY 50-200-25 MG TABS: 50-200-25 | 30 days supply | Qty: 30 | Fill #0

## 2018-06-01 NOTE — Telephone Encounter (Signed)
Pt cld to cancel appt w/Dr. Irene Limbo on 2/19 at 10am. She's chosen not to reschedule at this time.

## 2018-06-02 ENCOUNTER — Inpatient Hospital Stay: Payer: Self-pay | Admitting: Hematology

## 2018-06-08 ENCOUNTER — Encounter: Payer: Self-pay | Admitting: Infectious Diseases

## 2018-06-13 HISTORY — PX: BREAST EXCISIONAL BIOPSY: SUR124

## 2018-06-16 ENCOUNTER — Encounter (HOSPITAL_BASED_OUTPATIENT_CLINIC_OR_DEPARTMENT_OTHER): Payer: Self-pay

## 2018-06-16 ENCOUNTER — Other Ambulatory Visit: Payer: Self-pay

## 2018-06-21 ENCOUNTER — Other Ambulatory Visit: Payer: Self-pay

## 2018-06-22 ENCOUNTER — Other Ambulatory Visit: Payer: Self-pay

## 2018-06-22 ENCOUNTER — Encounter (HOSPITAL_BASED_OUTPATIENT_CLINIC_OR_DEPARTMENT_OTHER)
Admission: RE | Admit: 2018-06-22 | Discharge: 2018-06-22 | Disposition: A | Discharge status: Home / Self Care | Payer: No Typology Code available for payment source | Source: Ambulatory Visit | Attending: General Surgery | Admitting: General Surgery

## 2018-06-22 ENCOUNTER — Ambulatory Visit
Admission: RE | Admit: 2018-06-22 | Discharge: 2018-06-22 | Disposition: A | Discharge status: Home / Self Care | Payer: PRIVATE HEALTH INSURANCE | Source: Ambulatory Visit | Attending: General Surgery | Admitting: General Surgery

## 2018-06-22 DIAGNOSIS — R928 Other abnormal and inconclusive findings on diagnostic imaging of breast: Secondary | ICD-10-CM

## 2018-06-22 DIAGNOSIS — B20 Human immunodeficiency virus [HIV] disease: Secondary | ICD-10-CM

## 2018-06-22 DIAGNOSIS — Z01812 Encounter for preprocedural laboratory examination: Secondary | ICD-10-CM | POA: Insufficient documentation

## 2018-06-22 DIAGNOSIS — D500 Iron deficiency anemia secondary to blood loss (chronic): Secondary | ICD-10-CM

## 2018-06-22 LAB — POCT PREGNANCY, URINE: Preg Test, Ur: NEGATIVE

## 2018-06-22 NOTE — H&P (Signed)
Tammy Wickliffe Gragert Location: [facility] Patient #: [id number] DOB: 09-18-1970 Single / Language: Cleophus Molt / Race: Black or African American Female   History of Present Illness The patient is a 48 year old female who presents with a complaint of Breast problems. Pt is a 48 yo F who is referred for consultation by Dr. Elly Modena for a right abnormal mammogram. She had a palpable mass on the right, and she subsequently underwent bilateral dx mammography. The palpable region diappeared by the time of mammogram/us, but in the region was found to be dense gladular breast tissue. Also upon further questioning, she reported occasional dark brown nipple discharge. She otherwise feels well. She denies family history of breast cancer. She has not had breast pain.    dx mammogram/us bilat 04/09/2018  EXAM: DIGITAL DIAGNOSTIC BILATERAL MAMMOGRAM WITH CAD AND TOMO  ULTRASOUND RIGHT BREAST  COMPARISON: None.  ACR Breast Density Category c: The breast tissue is heterogeneously dense, which may obscure small masses.  FINDINGS: Normal appearing breast tissue bilaterally with no mammographically visible mass or other findings suspicious for malignancy in either breast.  Mammographic images were processed with CAD.  On physical exam, there is an approximately 6 mm rounded, circumscribed, firm palpable mass in the 4 o'clock position of the right breast, 2 cm from the nipple. There are no palpable retroareolar masses on the right. A small amount of clear brown colored discharge was elicited from 2 right nipple orifices, 1 in the 10 o'clock position and 1 in the 8 o'clock position. There are no palpable right axillary lymph nodes.  Targeted ultrasound is performed, showing dense glandular tissue with a convex anterior margin extending just beneath the skin in the 4 o'clock position of the right breast, 2 cm from the nipple, corresponding to the palpable mass. No actual mass is  seen.  There are multiple dilated retroareolar ducts on the right. One of the ducts, in the 7 o'clock position, 2 cm from the nipple, contains an 8 mm oval, circumscribed, medium echotexture mass with internal blood flow with power Doppler.  More superficially, in the 7 o'clock retroareolar region of the right breast, this duct contains a 2nd mass measuring 11 mm in maximum diameter. This is also medium echotexture and contains some echogenic components. No definite internal blood flow was seen with power Doppler.  Ultrasound of the right axilla demonstrated normal appearing right axillary lymph nodes.  IMPRESSION: 1. Right breast duct ectasia with a dilated duct in the 7 o'clock position containing 2 intraductal masses, as described above. 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 2. The palpable mass in the 4 o'clock position of the right breast is normal dense glandular tissue with a convex anterior margin.  RECOMMENDATION: Ultrasound guided core needle biopsies of the 2 intraductal masses in the 7 o'clock position of the right breast. This has been discussed with the patient and scheduled at 7:30 a.m. on 04/19/2018.  I have discussed the findings and recommendations with the patient. Results were also provided in writing at the conclusion of the visit. If applicable, a reminder letter will be sent to the patient regarding the next appointment.  BI-RADS CATEGORY 4: Suspicious.  pathology 1/6/2020Diagnosis 1. Breast, right, needle core biopsy, lower outer quadrant 7 o'clock 2cm from nipple, 0.8 cm - FIBROCYSTIC CHANGES. - THERE IS NO EVIDENCE OF MALIGNANCY. - SEE COMMENT. 2. Breast, right, needle core biopsy, lower outer quadrant, 7 o'clock 2cm from nipple, 1.1 cm - INTRADUCTAL PAPILLOMA. - SEE COMMENT.   Past Surgical  History No pertinent past surgical history   Diagnostic Studies History Colonoscopy  never Pap Smear  1-5 years ago  Allergies No Known Drug  Allergies [05/20/2018]: Allergies Reconciled   Medication History Biktarvy (50-200-25MG  Tablet, Oral) Active. Medications Reconciled  Social History Alcohol use  Occasional alcohol use. Caffeine use  Coffee. No drug use  Tobacco use  Never smoker.  Family History First Degree Relatives  No pertinent family history   Pregnancy / Birth History Age at menarche  35 years. Regular periods   Other Problems No pertinent past medical history     Review of Systems General Not Present- Appetite Loss, Chills, Fatigue, Fever, Night Sweats, Weight Gain and Weight Loss. Skin Not Present- Change in Wart/Mole, Dryness, Hives, Jaundice, New Lesions, Non-Healing Wounds, Rash and Ulcer. Respiratory Present- Chronic Cough. Not Present- Bloody sputum, Difficulty Breathing, Snoring and Wheezing. Gastrointestinal Not Present- Abdominal Pain, Bloating, Bloody Stool, Change in Bowel Habits, Chronic diarrhea, Constipation, Difficulty Swallowing, Excessive gas, Gets full quickly at meals, Hemorrhoids, Indigestion, Nausea, Rectal Pain and Vomiting. Female Genitourinary Not Present- Frequency, Nocturia, Painful Urination, Pelvic Pain and Urgency. Musculoskeletal Not Present- Back Pain, Joint Pain, Joint Stiffness, Muscle Pain, Muscle Weakness and Swelling of Extremities. Neurological Not Present- Decreased Memory, Fainting, Headaches, Numbness, Seizures, Tingling, Tremor, Trouble walking and Weakness. Psychiatric Not Present- Anxiety, Bipolar, Change in Sleep Pattern, Depression, Fearful and Frequent crying. Endocrine Not Present- Cold Intolerance, Excessive Hunger, Hair Changes, Heat Intolerance, Hot flashes and New Diabetes. Hematology Present- HIV. Not Present- Blood Thinners, Easy Bruising, Excessive bleeding, Gland problems and Persistent Infections.  Vitals Weight: 105.6 lb Height: 61in Body Surface Area: 1.44 m Body Mass Index: 19.95 kg/m  Temp.: 99.37F  Pulse: 101  (Regular)  BP: 110/70 (Sitting, Left Arm, Standard)       Physical Exam General Mental Status-Alert. General Appearance-Consistent with stated age. Hydration-Well hydrated. Voice-Normal.  Head and Neck Head-normocephalic, atraumatic with no lesions or palpable masses. Trachea-midline. Thyroid Gland Characteristics - normal size and consistency.  Eye Eyeball - Bilateral-Extraocular movements intact. Sclera/Conjunctiva - Bilateral-No scleral icterus.  Chest and Lung Exam Chest and lung exam reveals -quiet, even and easy respiratory effort with no use of accessory muscles and on auscultation, normal breath sounds, no adventitious sounds and normal vocal resonance. Inspection Chest Wall - Normal. Back - normal.  Breast Note: no palpable masses. breasts symmetric bilaterally. I cannot elicit discharge from the right nipple. No nipple retraction. No LAD. mild ptosis.   Cardiovascular Cardiovascular examination reveals -normal heart sounds, regular rate and rhythm with no murmurs and normal pedal pulses bilaterally.  Abdomen Inspection Inspection of the abdomen reveals - No Hernias. Palpation/Percussion Palpation and Percussion of the abdomen reveal - Soft, Non Tender, No Rebound tenderness, No Rigidity (guarding) and No hepatosplenomegaly. Auscultation Auscultation of the abdomen reveals - Bowel sounds normal.  Neurologic Neurologic evaluation reveals -alert and oriented x 3 with no impairment of recent or remote memory. Mental Status-Normal.  Musculoskeletal Global Assessment -Note: no gross deformities.  Normal Exam - Left-Upper Extremity Strength Normal and Lower Extremity Strength Normal. Normal Exam - Right-Upper Extremity Strength Normal and Lower Extremity Strength Normal.  Lymphatic Head & Neck  General Head & Neck Lymphatics: Bilateral - Description - Normal. Axillary  General Axillary Region: Bilateral - Description -  Normal. Tenderness - Non Tender. Femoral & Inguinal  Generalized Femoral & Inguinal Lymphatics: Bilateral - Description - No Generalized lymphadenopathy.    Assessment & Plan ABNORMAL MAMMOGRAM OF RIGHT BREAST (R92.8) Impression: Given pathology of papilloma,  will plan seed localized excision. Discussed rationale for this.  The surgical procedure was described to the patient. I discussed the incision type and location and that we would need radiology involved on with a wire or seed marker and/or sentinel node.  The risks and benefits of the procedure were described to the patient and she wishes to proceed.  We discussed the risks bleeding, infection, damage to other structures, need for further procedures/surgeries. We discussed the risk of seroma. The patient was advised if the area in the breast in cancer, we may need to go back to surgery for additional tissue to obtain negative margins or for a lymph node biopsy. The patient was advised that these are the most common complications, but that others can occur as well. They were advised against taking aspirin or other anti-inflammatory agents/blood thinners the week before surgery. Current Plans Schedule for Surgery Pt Education - [facility] Breast Biopsy [facility]: discussed with patient and provided information.   Signed by Stark Klein, MD

## 2018-06-22 NOTE — Progress Notes (Signed)
Ensure pre surgery drink given with instructions to complete by [place], surgical soap given with instructions, pt verbalized understanding.   Anesthesia consult per Dr. Valma Cava, will proceed with surgery as scheduled.

## 2018-06-23 ENCOUNTER — Other Ambulatory Visit: Payer: Self-pay

## 2018-06-23 ENCOUNTER — Encounter (HOSPITAL_BASED_OUTPATIENT_CLINIC_OR_DEPARTMENT_OTHER): Payer: Self-pay | Admitting: Certified Registered"

## 2018-06-23 ENCOUNTER — Ambulatory Visit (HOSPITAL_BASED_OUTPATIENT_CLINIC_OR_DEPARTMENT_OTHER)
Admission: RE | Admit: 2018-06-23 | Discharge: 2018-06-23 | Disposition: A | Discharge status: Home / Self Care | Payer: No Typology Code available for payment source | Attending: General Surgery | Admitting: General Surgery

## 2018-06-23 ENCOUNTER — Ambulatory Visit (HOSPITAL_BASED_OUTPATIENT_CLINIC_OR_DEPARTMENT_OTHER): Payer: No Typology Code available for payment source | Admitting: Certified Registered"

## 2018-06-23 ENCOUNTER — Ambulatory Visit
Admission: RE | Admit: 2018-06-23 | Discharge: 2018-06-23 | Disposition: A | Discharge status: Home / Self Care | Payer: PRIVATE HEALTH INSURANCE | Source: Ambulatory Visit | Attending: General Surgery | Admitting: General Surgery

## 2018-06-23 ENCOUNTER — Encounter (HOSPITAL_BASED_OUTPATIENT_CLINIC_OR_DEPARTMENT_OTHER)
Admission: RE | Disposition: A | Discharge status: Home / Self Care | Payer: Self-pay | Source: Home / Self Care | Attending: General Surgery

## 2018-06-23 ENCOUNTER — Telehealth (HOSPITAL_COMMUNITY): Payer: Self-pay | Admitting: *Deleted

## 2018-06-23 DIAGNOSIS — K219 Gastro-esophageal reflux disease without esophagitis: Secondary | ICD-10-CM | POA: Insufficient documentation

## 2018-06-23 DIAGNOSIS — Z21 Asymptomatic human immunodeficiency virus [HIV] infection status: Secondary | ICD-10-CM | POA: Insufficient documentation

## 2018-06-23 DIAGNOSIS — R928 Other abnormal and inconclusive findings on diagnostic imaging of breast: Secondary | ICD-10-CM

## 2018-06-23 DIAGNOSIS — D241 Benign neoplasm of right breast: Secondary | ICD-10-CM | POA: Insufficient documentation

## 2018-06-23 HISTORY — PX: RADIOACTIVE SEED GUIDED EXCISIONAL BREAST BIOPSY: SHX6490

## 2018-06-23 HISTORY — DX: Cardiac murmur, unspecified: R01.1

## 2018-06-23 LAB — T-HELPER CELL (CD4) - (RCID CLINIC ONLY)
CD4 T CELL HELPER: 9 % — AB (ref 33–55)
CD4 T Cell Abs: 170 /uL — ABNORMAL LOW (ref 400–2700)

## 2018-06-23 SURGERY — RADIOACTIVE SEED GUIDED BREAST BIOPSY
Anesthesia: General | Site: Breast | Laterality: Right

## 2018-06-23 MED ORDER — FENTANYL CITRATE (PF) 100 MCG/2ML IJ SOLN
INTRAMUSCULAR | Status: AC
  Filled 2018-06-23: qty 2

## 2018-06-23 MED ORDER — FENTANYL CITRATE (PF) 100 MCG/2ML IJ SOLN
50.0000 ug | INTRAMUSCULAR | Status: DC | PRN
  Administered 2018-06-23: 100 ug via INTRAVENOUS

## 2018-06-23 MED ORDER — OXYCODONE HCL 5 MG PO TABS: 5.0000 mg | ORAL_TABLET | Freq: Four times a day (QID) | ORAL | 0 refills | Status: DC | PRN

## 2018-06-23 MED ORDER — LIDOCAINE-EPINEPHRINE (PF) 1 %-1:200000 IJ SOLN
INTRAMUSCULAR | Status: AC
  Filled 2018-06-23: qty 30

## 2018-06-23 MED ORDER — PROPOFOL 10 MG/ML IV BOLUS
INTRAVENOUS | Status: DC | PRN
  Administered 2018-06-23: 120 mg via INTRAVENOUS

## 2018-06-23 MED ORDER — FENTANYL CITRATE (PF) 100 MCG/2ML IJ SOLN: 25.0000 ug | INTRAMUSCULAR | Status: DC | PRN

## 2018-06-23 MED ORDER — LACTATED RINGERS IV SOLN
INTRAVENOUS | Status: DC
  Administered 2018-06-23 (×2): via INTRAVENOUS

## 2018-06-23 MED ORDER — CEFAZOLIN SODIUM-DEXTROSE 2-4 GM/100ML-% IV SOLN
INTRAVENOUS | Status: AC
  Filled 2018-06-23: qty 100

## 2018-06-23 MED ORDER — CHLORHEXIDINE GLUCONATE CLOTH 2 % EX PADS: 6.0000 | MEDICATED_PAD | Freq: Once | CUTANEOUS | Status: DC

## 2018-06-23 MED ORDER — GABAPENTIN 300 MG PO CAPS
300.0000 mg | ORAL_CAPSULE | ORAL | Status: AC
  Administered 2018-06-23: 300 mg via ORAL

## 2018-06-23 MED ORDER — DEXAMETHASONE SODIUM PHOSPHATE 4 MG/ML IJ SOLN
INTRAMUSCULAR | Status: DC | PRN
  Administered 2018-06-23: 10 mg via INTRAVENOUS

## 2018-06-23 MED ORDER — ACETAMINOPHEN 500 MG PO TABS
ORAL_TABLET | ORAL | Status: AC
  Filled 2018-06-23: qty 2

## 2018-06-23 MED ORDER — LIDOCAINE HCL (CARDIAC) PF 100 MG/5ML IV SOSY
PREFILLED_SYRINGE | INTRAVENOUS | Status: DC | PRN
  Administered 2018-06-23: 60 mg via INTRAVENOUS

## 2018-06-23 MED ORDER — SODIUM CHLORIDE (PF) 0.9 % IJ SOLN
INTRAMUSCULAR | Status: AC
  Filled 2018-06-23: qty 10

## 2018-06-23 MED ORDER — BUPIVACAINE HCL (PF) 0.5 % IJ SOLN
INTRAMUSCULAR | Status: AC
  Filled 2018-06-23: qty 60

## 2018-06-23 MED ORDER — SCOPOLAMINE 1 MG/3DAYS TD PT72: 1.0000 | MEDICATED_PATCH | Freq: Once | TRANSDERMAL | Status: DC | PRN

## 2018-06-23 MED ORDER — ACETAMINOPHEN 500 MG PO TABS
1000.0000 mg | ORAL_TABLET | ORAL | Status: AC
  Administered 2018-06-23: 1000 mg via ORAL

## 2018-06-23 MED ORDER — MEPERIDINE HCL 25 MG/ML IJ SOLN: 6.2500 mg | INTRAMUSCULAR | Status: DC | PRN

## 2018-06-23 MED ORDER — ONDANSETRON HCL 4 MG/2ML IJ SOLN
INTRAMUSCULAR | Status: DC | PRN
  Administered 2018-06-23: 4 mg via INTRAVENOUS

## 2018-06-23 MED ORDER — LIDOCAINE-EPINEPHRINE (PF) 1 %-1:200000 IJ SOLN
INTRAMUSCULAR | Status: DC | PRN
  Administered 2018-06-23: 20 mL via INTRAMUSCULAR

## 2018-06-23 MED ORDER — METOCLOPRAMIDE HCL 5 MG/ML IJ SOLN: 10.0000 mg | Freq: Once | INTRAMUSCULAR | Status: DC | PRN

## 2018-06-23 MED ORDER — ACETAMINOPHEN 500 MG PO TABS
ORAL_TABLET | ORAL | Status: AC
  Filled 2018-06-23: qty 1

## 2018-06-23 MED ORDER — ONDANSETRON HCL 4 MG/2ML IJ SOLN
INTRAMUSCULAR | Status: AC
  Filled 2018-06-23: qty 10

## 2018-06-23 MED ORDER — MIDAZOLAM HCL 2 MG/2ML IJ SOLN
1.0000 mg | INTRAMUSCULAR | Status: DC | PRN
  Administered 2018-06-23: 2 mg via INTRAVENOUS

## 2018-06-23 MED ORDER — LIDOCAINE 2% (20 MG/ML) 5 ML SYRINGE
INTRAMUSCULAR | Status: AC
  Filled 2018-06-23: qty 10

## 2018-06-23 MED ORDER — PROPOFOL 10 MG/ML IV BOLUS
INTRAVENOUS | Status: AC
  Filled 2018-06-23: qty 20

## 2018-06-23 MED ORDER — GABAPENTIN 300 MG PO CAPS
ORAL_CAPSULE | ORAL | Status: AC
  Filled 2018-06-23: qty 1

## 2018-06-23 MED ORDER — MIDAZOLAM HCL 2 MG/2ML IJ SOLN
INTRAMUSCULAR | Status: AC
  Filled 2018-06-23: qty 2

## 2018-06-23 MED ORDER — METHYLENE BLUE 0.5 % INJ SOLN
INTRAVENOUS | Status: AC
  Filled 2018-06-23: qty 10

## 2018-06-23 MED ORDER — LACTATED RINGERS IV SOLN: INTRAVENOUS | Status: DC

## 2018-06-23 MED ORDER — DEXAMETHASONE SODIUM PHOSPHATE 10 MG/ML IJ SOLN
INTRAMUSCULAR | Status: AC
  Filled 2018-06-23: qty 2

## 2018-06-23 MED ORDER — CEFAZOLIN SODIUM-DEXTROSE 2-4 GM/100ML-% IV SOLN
2.0000 g | INTRAVENOUS | Status: AC
  Administered 2018-06-23: 2 g via INTRAVENOUS

## 2018-06-23 SURGICAL SUPPLY — 56 items
BINDER BREAST MEDIUM (GAUZE/BANDAGES/DRESSINGS) ×2 IMPLANT
BLADE SURG 10 STRL SS (BLADE) ×3 IMPLANT
BLADE SURG 15 STRL LF DISP TIS (BLADE) IMPLANT
BLADE SURG 15 STRL SS (BLADE)
CANISTER SUC SOCK COL 7IN (MISCELLANEOUS) IMPLANT
CANISTER SUCT 1200ML W/VALVE (MISCELLANEOUS) ×3 IMPLANT
CHLORAPREP W/TINT 26 (MISCELLANEOUS) ×3 IMPLANT
CLIP VESOCCLUDE LG 6/CT (CLIP) ×3 IMPLANT
CLOSURE WOUND 1/2 X4 (GAUZE/BANDAGES/DRESSINGS) ×1
COVER BACK TABLE 60X90IN (DRAPES) ×3 IMPLANT
COVER MAYO STAND STRL (DRAPES) ×3 IMPLANT
COVER PROBE W GEL 5X96 (DRAPES) ×3 IMPLANT
COVER WAND RF STERILE (DRAPES) IMPLANT
DECANTER SPIKE VIAL GLASS SM (MISCELLANEOUS) IMPLANT
DERMABOND ADVANCED (GAUZE/BANDAGES/DRESSINGS) ×2
DERMABOND ADVANCED .7 DNX12 (GAUZE/BANDAGES/DRESSINGS) ×1 IMPLANT
DRAPE LAPAROSCOPIC ABDOMINAL (DRAPES) ×3 IMPLANT
DRAPE UTILITY XL STRL (DRAPES) ×3 IMPLANT
ELECT COATED BLADE 2.86 ST (ELECTRODE) ×3 IMPLANT
ELECT REM PT RETURN 9FT ADLT (ELECTROSURGICAL) ×3
ELECTRODE REM PT RTRN 9FT ADLT (ELECTROSURGICAL) ×1 IMPLANT
GAUZE SPONGE 4X4 12PLY STRL LF (GAUZE/BANDAGES/DRESSINGS) ×3 IMPLANT
GLOVE BIO SURGEON STRL SZ 6 (GLOVE) ×3 IMPLANT
GLOVE BIO SURGEON STRL SZ 6.5 (GLOVE) ×1 IMPLANT
GLOVE BIO SURGEONS STRL SZ 6.5 (GLOVE) ×1
GLOVE BIOGEL PI IND STRL 6.5 (GLOVE) ×1 IMPLANT
GLOVE BIOGEL PI IND STRL 7.0 (GLOVE) IMPLANT
GLOVE BIOGEL PI INDICATOR 6.5 (GLOVE) ×2
GLOVE BIOGEL PI INDICATOR 7.0 (GLOVE) ×2
GOWN STRL REUS W/ TWL LRG LVL3 (GOWN DISPOSABLE) ×1 IMPLANT
GOWN STRL REUS W/TWL 2XL LVL3 (GOWN DISPOSABLE) ×3 IMPLANT
GOWN STRL REUS W/TWL LRG LVL3 (GOWN DISPOSABLE) ×2
KIT MARKER MARGIN INK (KITS) ×3 IMPLANT
LIGHT WAVEGUIDE WIDE FLAT (MISCELLANEOUS) IMPLANT
NDL HYPO 25X1 1.5 SAFETY (NEEDLE) ×1 IMPLANT
NEEDLE HYPO 25X1 1.5 SAFETY (NEEDLE) ×3 IMPLANT
NS IRRIG 1000ML POUR BTL (IV SOLUTION) ×3 IMPLANT
PACK BASIN DAY SURGERY FS (CUSTOM PROCEDURE TRAY) ×3 IMPLANT
PENCIL BUTTON HOLSTER BLD 10FT (ELECTRODE) ×3 IMPLANT
SLEEVE SCD COMPRESS KNEE MED (MISCELLANEOUS) ×3 IMPLANT
SPONGE LAP 18X18 RF (DISPOSABLE) ×3 IMPLANT
STAPLER VISISTAT 35W (STAPLE) IMPLANT
STRIP CLOSURE SKIN 1/2X4 (GAUZE/BANDAGES/DRESSINGS) ×2 IMPLANT
SUT MON AB 4-0 PC3 18 (SUTURE) ×3 IMPLANT
SUT SILK 2 0 SH (SUTURE) IMPLANT
SUT VIC AB 2-0 SH 18 (SUTURE) IMPLANT
SUT VIC AB 3-0 SH 27 (SUTURE)
SUT VIC AB 3-0 SH 27X BRD (SUTURE) ×1 IMPLANT
SUT VICRYL 3-0 CR8 SH (SUTURE) ×2 IMPLANT
SYR BULB 3OZ (MISCELLANEOUS) ×3 IMPLANT
SYR CONTROL 10ML LL (SYRINGE) ×3 IMPLANT
TOWEL GREEN STERILE FF (TOWEL DISPOSABLE) ×3 IMPLANT
TRAY FAXITRON CT DISP (TRAY / TRAY PROCEDURE) ×3 IMPLANT
TUBE CONNECTING 20'X1/4 (TUBING) ×1
TUBE CONNECTING 20X1/4 (TUBING) ×2 IMPLANT
YANKAUER SUCT BULB TIP NO VENT (SUCTIONS) ×3 IMPLANT

## 2018-06-23 NOTE — Anesthesia Procedure Notes (Signed)
Procedure Name: LMA Insertion Date/Time: 06/23/2018 9:08 AM Performed by: Signe Colt, CRNA Pre-anesthesia Checklist: Patient identified, Emergency Drugs available, Suction available and Patient being monitored Patient Re-evaluated:Patient Re-evaluated prior to induction Oxygen Delivery Method: Circle system utilized Preoxygenation: Pre-oxygenation with 100% oxygen Induction Type: IV induction Ventilation: Mask ventilation without difficulty LMA: LMA inserted LMA Size: 4.0 Number of attempts: 1 Airway Equipment and Method: Bite block Placement Confirmation: positive ETCO2 Tube secured with: Tape Dental Injury: Teeth and Oropharynx as per pre-operative assessment

## 2018-06-23 NOTE — Discharge Instructions (Addendum)
[facility] °Office Phone Number [phone] ° °BREAST BIOPSY/ PARTIAL MASTECTOMY: POST OP INSTRUCTIONS ° °Always review your discharge instruction sheet given to you by the facility where your surgery was performed. ° °IF YOU HAVE DISABILITY OR FAMILY LEAVE FORMS, YOU MUST BRING THEM TO THE OFFICE FOR PROCESSING.  DO NOT GIVE THEM TO YOUR DOCTOR. ° °1. A prescription for pain medication may be given to you upon discharge.  Take your pain medication as prescribed, if needed.  If narcotic pain medicine is not needed, then you may take acetaminophen (Tylenol) or ibuprofen (Advil) as needed. °2. Take your usually prescribed medications unless otherwise directed °3. If you need a refill on your pain medication, please contact your pharmacy.  They will contact our office to request authorization.  Prescriptions will not be filled after 5pm or on week-ends. °4. You should eat very light the first 24 hours after surgery, such as soup, crackers, pudding, etc.  Resume your normal diet the day after surgery. °5. Most patients will experience some swelling and bruising in the breast.  Ice packs and a good support bra will help.  Swelling and bruising can take several days to resolve.  °6. It is common to experience some constipation if taking pain medication after surgery.  Increasing fluid intake and taking a stool softener will usually help or prevent this problem from occurring.  A mild laxative (Milk of Magnesia or Miralax) should be taken according to package directions if there are no bowel movements after 48 hours. °7. Unless discharge instructions indicate otherwise, you may remove your bandages 48 hours after surgery, and you may shower at that time.  You may have steri-strips (small skin tapes) in place directly over the incision.  These strips should be left on the skin for 7-10 days.   Any sutures or staples will be removed at the office during your follow-up visit. °8. ACTIVITIES:  You may resume  regular daily activities (gradually increasing) beginning the next day.  Wearing a good support bra or sports bra (or the breast binder) minimizes pain and swelling.  You may have sexual intercourse when it is comfortable. °a. You may drive when you no longer are taking prescription pain medication, you can comfortably wear a seatbelt, and you can safely maneuver your car and apply brakes. °b. RETURN TO WORK:  __________1 week_______________ °9. You should see your doctor in the office for a follow-up appointment approximately two weeks after your surgery.  Your doctor’s nurse will typically make your follow-up appointment when she calls you with your pathology report.  Expect your pathology report 2-3 business days after your surgery.  You may call to check if you do not hear from us after three days. ° ° °WHEN TO CALL YOUR DOCTOR: °1. Fever over 101.0 °2. Nausea and/or vomiting. °3. Extreme swelling or bruising. °4. Continued bleeding from incision. °5. Increased pain, redness, or drainage from the incision. ° °The clinic staff is available to answer your questions during regular business hours.  Please don’t hesitate to call and ask to speak to one of the nurses for clinical concerns.  If you have a medical emergency, go to the nearest emergency room or call 911.  A surgeon from [facility] is always on call at the hospital. ° °For further questions, please visit [email or web address]  ° ° °Post Anesthesia Home Care Instructions ° °Activity: °Get plenty of rest for the remainder of the day. A responsible individual must stay with you for 24   hours following the procedure.  °For the next 24 hours, DO NOT: °-Drive a car °-Operate machinery °-Drink alcoholic beverages °-Take any medication unless instructed by your physician °-Make any legal decisions or sign important papers. ° °Meals: °Start with liquid foods such as gelatin or soup. Progress to regular foods as tolerated. Avoid greasy, spicy,  heavy foods. If nausea and/or vomiting occur, drink only clear liquids until the nausea and/or vomiting subsides. Call your physician if vomiting continues. ° °Special Instructions/Symptoms: °Your throat may feel dry or sore from the anesthesia or the breathing tube placed in your throat during surgery. If this causes discomfort, gargle with warm salt water. The discomfort should disappear within 24 hours. ° °If you had a scopolamine patch placed behind your ear for the management of post- operative nausea and/or vomiting: ° °1. The medication in the patch is effective for 72 hours, after which it should be removed.  Wrap patch in a tissue and discard in the trash. Wash hands thoroughly with soap and water. °2. You may remove the patch earlier than 72 hours if you experience unpleasant side effects which may include dry mouth, dizziness or visual disturbances. °3. Avoid touching the patch. Wash your hands with soap and water after contact with the patch. °  ° °

## 2018-06-23 NOTE — Op Note (Signed)
Right Breast Radioactive seed localized excisional breast biopsy  Indications: This patient presents with history of right nipple discharge and abnormal right mammogram.  Core needle biopsy showed intraductal papilloma.      Pre-operative Diagnosis: abnormal right mammogram and right bloody nipple discharge  Post-operative Diagnosis: Same  Surgeon: Stark Klein   Anesthesia: General endotracheal anesthesia  ASA Class: 2  Procedure Details  The patient was seen in the Holding Room. The risks, benefits, complications, treatment options, and expected outcomes were discussed with the patient. The possibilities of bleeding, infection, the need for additional procedures, failure to diagnose a condition, and creating a complication requiring transfusion or operation were discussed with the patient. The patient concurred with the proposed plan, giving informed consent.  The site of surgery properly noted/marked. The patient was taken to Operating Room # 8, identified, and the procedure verified as Right Breast Seed Localized excisional biopsy. A Time Out was held and the above information confirmed.  The right breast and chest were prepped and draped in standard fashion. The duct in the nipple with the bloody nipple discharge was probed with the smallest lacrimal duct probe.  This went directly to the area with the seed.  The probe was removed.  The excision was performed by creating a inferior circumareolar incision near the previously placed radioactive seed.  Dissection was carried down to around the point of maximum signal intensity. The cautery was used to perform the dissection.  Hemostasis was achieved with cautery. The cavity was marked with 1 clip(s). The specimen was inked with the margin marker paint kit.    Specimen radiography confirmed inclusion of the mammographic lesion, the clip, and the seed.  The background signal in the breast was zero.  The wound was irrigated and closed with 3-0 vicryl  in layers and 4-0 monocryl subcuticular suture.      Sterile dressings were applied. At the end of the operation, all sponge, instrument, and needle counts were correct.  Findings: grossly clear surgical margins and no adenopathy.  Very dense breast tissue.    Estimated Blood Loss:  min         Specimens: right breast tissue with seed.           Complications:  None; patient tolerated the procedure well.         Disposition: PACU - hemodynamically stable.         Condition: stable

## 2018-06-23 NOTE — Anesthesia Preprocedure Evaluation (Signed)
Anesthesia Evaluation  Patient identified by MRN, date of birth, ID band Patient awake    Reviewed: Allergy & Precautions, NPO status , Patient's Chart, lab work & pertinent test results  Airway Mallampati: II  TM Distance: >3 FB Neck ROM: Full    Dental no notable dental hx.    Pulmonary neg pulmonary ROS,    Pulmonary exam normal breath sounds clear to auscultation       Cardiovascular negative cardio ROS Normal cardiovascular exam Rhythm:Regular Rate:Normal     Neuro/Psych negative neurological ROS  negative psych ROS   GI/Hepatic Neg liver ROS, GERD  Controlled,  Endo/Other  negative endocrine ROS  Renal/GU negative Renal ROS  negative genitourinary   Musculoskeletal negative musculoskeletal ROS (+)   Abdominal   Peds negative pediatric ROS (+)  Hematology  (+) HIV,   Anesthesia Other Findings   Reproductive/Obstetrics negative OB ROS                             Anesthesia Physical Anesthesia Plan  ASA: II  Anesthesia Plan: General   Post-op Pain Management:    Induction: Intravenous  PONV Risk Score and Plan: 3 and Ondansetron, Dexamethasone, Midazolam and Treatment may vary due to age or medical condition  Airway Management Planned: LMA  Additional Equipment:   Intra-op Plan:   Post-operative Plan: Extubation in OR  Informed Consent: I have reviewed the patients History and Physical, chart, labs and discussed the procedure including the risks, benefits and alternatives for the proposed anesthesia with the patient or authorized representative who has indicated his/her understanding and acceptance.     Dental advisory given  Plan Discussed with: CRNA  Anesthesia Plan Comments:         Anesthesia Quick Evaluation

## 2018-06-23 NOTE — Interval H&P Note (Signed)
History and Physical Interval Note:  06/23/2018 8:52 AM  Shirley White  has presented today for surgery, with the diagnosis of right abnormal mammogram.  The various methods of treatment have been discussed with the patient and family. After consideration of risks, benefits and other options for treatment, the patient has consented to  Procedure(s): RADIOACTIVE SEED GUIDED EXCISIONAL RIGHT BREAST BIOPSY (Right) as a surgical intervention.  The patient's history has been reviewed, patient examined, no change in status, stable for surgery.  I have reviewed the patient's chart and labs.  Questions were answered to the patient's satisfaction.     Stark Klein

## 2018-06-23 NOTE — Telephone Encounter (Signed)
Patient called and left voicemail. Patient stated she had surgery today and asked if her prescriptions are covered by [facility]. Called patient back and explained to patient that [facility] doesn't cover any prescriptions. Patient verbalized understanding.

## 2018-06-23 NOTE — Transfer of Care (Signed)
Immediate Anesthesia Transfer of Care Note  Patient: Shirley White  Procedure(s) Performed: RADIOACTIVE SEED GUIDED EXCISIONAL RIGHT BREAST BIOPSY (Right Breast)  Patient Location: PACU  Anesthesia Type:General  Level of Consciousness: drowsy and patient cooperative  Airway & Oxygen Therapy: Patient Spontanous Breathing and Patient connected to face mask oxygen  Post-op Assessment: Report given to RN and Post -op Vital signs reviewed and stable  Post vital signs: Reviewed and stable  Last Vitals:  Vitals Value Taken Time  BP    Temp    Pulse 88 06/23/2018 10:10 AM  Resp 14 06/23/2018 10:10 AM  SpO2 94 % 06/23/2018 10:10 AM  Vitals shown include unvalidated device data.  Last Pain:  Vitals:   06/23/18 0700  TempSrc: Oral  PainSc: 0-No pain      Patients Stated Pain Goal: 0 (66/91/67 5612)  Complications: No apparent anesthesia complications

## 2018-06-23 NOTE — Anesthesia Postprocedure Evaluation (Signed)
Anesthesia Post Note  Patient: Shirley White  Procedure(s) Performed: RADIOACTIVE SEED GUIDED EXCISIONAL RIGHT BREAST BIOPSY (Right Breast)     Patient location during evaluation: PACU Anesthesia Type: General Level of consciousness: awake and alert Pain management: pain level controlled Vital Signs Assessment: post-procedure vital signs reviewed and stable Respiratory status: spontaneous breathing, nonlabored ventilation, respiratory function stable and patient connected to nasal cannula oxygen Cardiovascular status: blood pressure returned to baseline and stable Postop Assessment: no apparent nausea or vomiting Anesthetic complications: no    Last Vitals:  Vitals:   06/23/18 1100 06/23/18 1130  BP: (!) 152/98 (!) 154/84  Pulse: 89 77  Resp: 14 20  Temp:  36.5 C  SpO2: 100%     Last Pain:  Vitals:   06/23/18 1130  TempSrc:   PainSc: 0-No pain                 Montez Hageman

## 2018-06-24 ENCOUNTER — Other Ambulatory Visit: Payer: Self-pay | Admitting: Infectious Diseases

## 2018-06-24 DIAGNOSIS — D500 Iron deficiency anemia secondary to blood loss (chronic): Secondary | ICD-10-CM

## 2018-06-25 ENCOUNTER — Encounter (HOSPITAL_BASED_OUTPATIENT_CLINIC_OR_DEPARTMENT_OTHER): Payer: Self-pay | Admitting: General Surgery

## 2018-06-25 LAB — CBC WITH DIFFERENTIAL/PLATELET
Absolute Monocytes: 644 cells/uL (ref 200–950)
Basophils Absolute: 122 cells/uL (ref 0–200)
Basophils Relative: 2.1 %
Eosinophils Absolute: 151 cells/uL (ref 15–500)
Eosinophils Relative: 2.6 %
HEMATOCRIT: 26.5 % — AB (ref 35.0–45.0)
Hemoglobin: 7.7 g/dL — ABNORMAL LOW (ref 11.7–15.5)
Lymphs Abs: 2036 cells/uL (ref 850–3900)
MCH: 18.7 pg — ABNORMAL LOW (ref 27.0–33.0)
MCHC: 29.1 g/dL — ABNORMAL LOW (ref 32.0–36.0)
MCV: 64.5 fL — ABNORMAL LOW (ref 80.0–100.0)
MPV: 10.4 fL (ref 7.5–12.5)
Monocytes Relative: 11.1 %
Neutro Abs: 2848 cells/uL (ref 1500–7800)
Neutrophils Relative %: 49.1 %
Platelets: 541 10*3/uL — ABNORMAL HIGH (ref 140–400)
RBC: 4.11 10*6/uL (ref 3.80–5.10)
RDW: 18.9 % — ABNORMAL HIGH (ref 11.0–15.0)
Total Lymphocyte: 35.1 %
WBC: 5.8 10*3/uL (ref 3.8–10.8)

## 2018-06-25 LAB — CBC MORPHOLOGY

## 2018-06-25 LAB — RPR: RPR Ser Ql: NONREACTIVE

## 2018-06-25 LAB — HIV-1 RNA QUANT-NO REFLEX-BLD
HIV 1 RNA Quant: 31 copies/mL — ABNORMAL HIGH
HIV-1 RNA Quant, Log: 1.49 Log copies/mL — ABNORMAL HIGH

## 2018-06-25 NOTE — Progress Notes (Signed)
Please let patient know pathology is benign.

## 2018-06-29 ENCOUNTER — Encounter: Payer: Self-pay | Admitting: *Deleted

## 2018-07-05 MED FILL — BIKTARVY 50-200-25 MG TABS: 50-200-25 | 30 days supply | Qty: 30 | Fill #1

## 2018-07-07 ENCOUNTER — Telehealth: Payer: Self-pay | Admitting: Internal Medicine

## 2018-07-07 NOTE — Telephone Encounter (Signed)
Pt has been cld and scheduled to see Dr. Walden Field on 4/9 at 10:50am. Pt aware to arrive 15 minutes early.

## 2018-07-12 ENCOUNTER — Encounter: Payer: Self-pay | Admitting: Infectious Diseases

## 2018-07-14 ENCOUNTER — Ambulatory Visit (INDEPENDENT_AMBULATORY_CARE_PROVIDER_SITE_OTHER): Payer: Self-pay | Admitting: Infectious Diseases

## 2018-07-14 ENCOUNTER — Encounter: Payer: Self-pay | Admitting: Infectious Diseases

## 2018-07-14 ENCOUNTER — Other Ambulatory Visit: Payer: Self-pay

## 2018-07-14 DIAGNOSIS — R87612 Low grade squamous intraepithelial lesion on cytologic smear of cervix (LGSIL): Secondary | ICD-10-CM

## 2018-07-14 DIAGNOSIS — D500 Iron deficiency anemia secondary to blood loss (chronic): Secondary | ICD-10-CM

## 2018-07-14 DIAGNOSIS — B20 Human immunodeficiency virus [HIV] disease: Secondary | ICD-10-CM

## 2018-07-14 NOTE — Assessment & Plan Note (Signed)
Appreciate women's health team care and assistance with managing her abnormal Pap smears.

## 2018-07-14 NOTE — Assessment & Plan Note (Addendum)
She likely needs iron IV and blood transfusion.  Aside from being very cold and mildly fatigued she is not having any symptoms that would warrant urgent evaluation at the hospital at this time.  Gust precautions over the phone today that would warrant earlier visit to hospital.  She understands.  She is aware of the appointment details for upcoming hematology visit.  I asked her to please continue her iron by mouth for now however strong emphasis on separating from [place].  Specifically do not take within 4 hours of each other and never at the same time.

## 2018-07-14 NOTE — Assessment & Plan Note (Signed)
Shirley White has really put forth good work and getting back on track with her HIV care.  She has been undetectable for > 6 months now.  I told her again today how proud I am of her.  We will continue her Biktarvy 1 pill once a day.

## 2018-07-14 NOTE — Assessment & Plan Note (Signed)
Per the [facility] Opportunistic Infection Team - the incidence of primary PCP among patients who had virologically suppressed HIV infection, were receiving daily ART, and who had CD4 cell counts between 100 - 200 was low irrespective of prophylaxis use.   I discussed this with Shirley White and [name] today over the phone.  Shirley White feels that pill burden plays a big role for her and would prefer to focus just on the Shirley White.  With her having established low viral loads over the last 6 months I feel comfortable having her stop her Bactrim, especially in light of her anemia.  This can be contributing although less likely.  I informed Shirley White that as long as she continues to take her Biktarvy once a day her risk is extremely low for any kind of opportunistic infection.  She has elected to stop the Bactrim.

## 2018-07-14 NOTE — Progress Notes (Signed)
Name: Shirley White  VZC:[id number]   DOB: Jun 05, 1970   PCP: Marliss Coots, NP    Virtual Visit via Telephone Note  I connected with Shirley White on 07/14/18 at 11:15 AM EDT by telephone and verified that I am speaking with the correct person using two identifiers.   I discussed the limitations, risks, security and privacy concerns of performing an evaluation and management service by telephone and the availability of in person appointments. I also discussed with the patient that there may be a patient responsible charge related to this service. The patient expressed understanding and agreed to proceed.   Chief Complaint  Patient presents with  . evisit    had recent breast surgery, went well;     History of Present Illness: Shirley White is a 48 y.o. woman with HIV disease originally diagnosed in 2005. Poor adherence with AIDS qualifying CD4s since 2016 with CD4 nadir 30. HIV Risk: heterosexual. History of OIs: esophageal candidiasis.   Previous Regimens:  Complera 2011 >> difficulty swallowing regimen  Emtriva + Edurant + Viread 2011 through 01-2014  [facility] 2016  Tiviay + Truvada 2018   Genotypes:  08-2014 - sensitive  11-2016 - sensitive    Shirley White tells me she is feeling very well.  She continues her Biktarvy 1 pill once a day as instructed.  She has missed no doses or concerns with side effects or access to medication.  [name] who is also HIV positive well-controlled on medications is asking if she should resume her Bactrim with her CD4 count of 170.  We referred her to the [facility] clinic to follow-up on abnormal Pap smear.  During breast assessment found to have lump.  She underwent a biopsy on March 11 which has come back benign.  Her cervical biopsies revealed LSIL.  Recommended to undergo repeat Pap testing in 6 months. She is very pleased that she has not needed further interventions for both these matters.  She  continues to experience fatigue and cold at home.  She has had intermittent anemia likely related to heavy menses and iron deficiency.  Most recent hemoglobin was 7.7.  We have discussed during previous encounter referral to hematology.  This is coming up in a few days.  She finds that she has a hard time getting the second dose of ferrous sulfate in and sometimes takes at the same time with her Biktarvy.     Medical/surgical/social/family history have been updated during today's visit.    Observations/Objective: Shirley White is in good spirits on the phone today.  She has made all of her appropriate follow-up visits for comorbid conditions between GYN and surgery team.  In discussion with her there is no signs of any opportunistic infection per her review of systems.  HIV 1 RNA Quant (copies/mL)  Date Value  06/22/2018 31 (H)  03/22/2018 <20 DETECTED (A)  01/05/2018 33 (H)   CD4 T Cell Abs (/uL)  Date Value  06/22/2018 170 (L)  03/22/2018 150 (L)  01/05/2018 80 (L)    Lab Results  Component Value Date   CREATININE 0.30 (L) 10/26/2017   CREATININE 0.44 07/14/2017   CREATININE 0.56 07/13/2017    Lab Results  Component Value Date   WBC 5.8 06/22/2018   HGB 7.7 (L) 06/22/2018   HCT 26.5 (L) 06/22/2018   MCV 64.5 (L) 06/22/2018   PLT 541 (H) 06/22/2018    Lab Results  Component Value Date   ALT 10 12/16/2016  AST 14 12/16/2016   ALKPHOS 60 12/16/2016   BILITOT 0.6 12/16/2016     Assessment and Plan: Problem List Items Addressed This Visit      Unprioritized   AIDS ([place]) (Chronic)    Per the [facility]ortunistic Infection Team - the incidence of primary PCP among patients who had virologically suppressed HIV infection, were receiving daily ART, and who had CD4 cell counts between 100 - 200 was low irrespective of prophylaxis use.   I discussed this with Shirley White and [name] today over the phone.  Shirley White feels that pill burden plays a big role for her and  would prefer to focus just on the [place].  With her having established low viral loads over the last 6 months I feel comfortable having her stop her Bactrim, especially in light of her anemia.  This can be contributing although less likely.  I informed Shirley White that as long as she continues to take her Biktarvy once a day her risk is extremely low for any kind of opportunistic infection.  She has elected to stop the Bactrim.       Iron deficiency anemia    She likely needs iron IV and blood transfusion.  Aside from being very cold and mildly fatigued she is not having any symptoms that would warrant urgent evaluation at the hospital at this time.  Gust precautions over the phone today that would warrant earlier visit to hospital.  She understands.  She is aware of the appointment details for upcoming hematology visit.  I asked her to please continue her iron by mouth for now however strong emphasis on separating from [place].  Specifically do not take within 4 hours of each other and never at the same time.      LGSIL on Pap smear of cervix    Appreciate women's health team care and assistance with managing her abnormal Pap smears.      HIV (human immunodeficiency virus infection) ([place])    Shirley White has really put forth good work and getting back on track with her HIV care.  She has been undetectable for > 6 months now.  I told her again today how proud I am of her.  We will continue her Biktarvy 1 pill once a day.              Follow Up Instructions: Return to clinic in July for office visit, lab draw, ADAP re-enrollment.  I discussed the assessment and treatment plan with the patient. The patient was provided an opportunity to ask questions and all were answered. The patient agreed with the plan and demonstrated an understanding of the instructions.   The patient was advised to call back or seek an in-person evaluation if the symptoms worsen or if the condition fails to improve as  anticipated.  I provided 15 minutes of non-face-to-face time during this encounter.   Janene Madeira, MSN, NP-C [email or web address] [email or web address] Pager: [phone] Office: [place]: [phone]

## 2018-07-14 NOTE — Patient Instructions (Addendum)
It was very nice to talk to you on the phone today.  I am glad to see that you are staying well and keeping healthy.  You are doing very well with taking your medication it is working for you beautifully.  Please continue your Biktarvy 1 pill once a day as we discussed  For your iron replacement please continue your iron ideally twice a day.  Do not forget to separate this from your Biktarvy by at least 4 hours.  Never in the stomach at the same time.  I am glad that we have you seeing the hematology team very soon.  As we discussed it is okay to stop your Bactrim as long as you continue to take your Biktarvy every day.  Follow-up with the gynecology team as they recommended for your abnormal Pap smear.  Please continue to be well, stay away from folks that are sick, wash her hands frequently, do not touch her face or mouth and continue to take your medications.  You are up-to-date for all recommended vaccines.  Looking forward to seeing you back in the office in July.  Your appointments for a lab visit, financial visit and office visit have been made and can be found in your MyChart account.

## 2018-07-22 ENCOUNTER — Telehealth: Payer: Self-pay | Admitting: Internal Medicine

## 2018-07-22 ENCOUNTER — Inpatient Hospital Stay: Payer: No Typology Code available for payment source | Admitting: Internal Medicine

## 2018-07-22 NOTE — Telephone Encounter (Signed)
Pt cld to rescheduled appt with Dr. Walden Field to 4/24 at 10:50 d/t transportation. Pt was in transit, but the vehicle [place].

## 2018-07-28 MED FILL — BIKTARVY 50-200-25 MG TABS: 50-200-25 | 30 days supply | Qty: 30 | Fill #2

## 2018-08-05 ENCOUNTER — Telehealth: Payer: Self-pay | Admitting: *Deleted

## 2018-08-05 NOTE — Telephone Encounter (Addendum)
PRIOR ORDERS ENDED ON 07/29/17 WITH REQUEST FOR NEW ORDERS MADE ON 08/04/17. NO VISIT MADE WITHOUT ORDERS IN PLACE.  RN contacted the patient today and left a message stating stating my name and that I want to ensure al is going well, offered assistance if needed. Last viral load looks amazing at 91 which I am excited about. I would also like to be sure she is taking her iron supplement.  Shirley White responded stating she just lost her job and wondered if I could help. Shirley White also stated she never stated on her iron either. Offer to assist with both.   Frequency / Duration of [facility] visits: Effective 07/30/18: 18mo4   4 PRN's for complications with disease process/progression, medication changes or concerns   [facility] will assess for learning needs related to diagnosis and treatment regimen, provide education as needed, fill pill box if needed, address any barriers which may be preventing medication compliance, and communicating with care team including physician and case manager.   Individualized [place] Period from 07/30/18 to 10/28/18  a. Type of service(s) and care to be delivered: RN Case Management  b. Frequency and duration of service: Effective 07/30/18 : 30mo4,  4 prns for complications with disease process/progression, medication changes or concerns . Visits/Contact may be conducted telephonically or in person to best suit the patient.  c. Activity restrictions: Pt may be up as tolerated and can safely  ambulate without the need for a assistive device   d. Safety Measures: Standard Precautions/Infection Control   e. Service Objectives and Goals: Service Objectives are to assist the pt with HIV medication regimen adherence and staying in care with the [facility] by identifying barriers to care. RN will address the barriers that are identified by the patient. Patient current states she is sick and would like to get her medications. Patient lost her job at [name]  and is looking for new employments. She is now in a relationship that supports and encourages her medication adherence.  f. Equipment required: No additional equipment needs at this time   g. Functional Limitations: None Noted   h. Rehabilitation potential: Guarded   i. Diet and Nutritional Needs: Regular Diet   j. Medications and treatments: Medications have been reconciled and reviewed and are a part of EPIC electronic file   k. Specific therapies if needed: RN   l. Pertinent diagnoses: HIV disease,  Hx of medication NonCompliance, Iron Deficiency Anemia  m. Expected outcome: Guarded

## 2018-08-06 ENCOUNTER — Other Ambulatory Visit: Payer: Self-pay

## 2018-08-06 ENCOUNTER — Encounter: Payer: Self-pay | Admitting: Internal Medicine

## 2018-08-06 ENCOUNTER — Inpatient Hospital Stay: Payer: Self-pay

## 2018-08-06 ENCOUNTER — Inpatient Hospital Stay: Payer: Self-pay | Attending: Internal Medicine | Admitting: Internal Medicine

## 2018-08-06 ENCOUNTER — Telehealth: Payer: Self-pay | Admitting: *Deleted

## 2018-08-06 VITALS — BP 134/71 | HR 80 | Temp 97.7°F | Resp 18 | Ht 62.5 in | Wt 104.7 lb

## 2018-08-06 DIAGNOSIS — N6314 Unspecified lump in the right breast, lower inner quadrant: Secondary | ICD-10-CM | POA: Insufficient documentation

## 2018-08-06 DIAGNOSIS — B20 Human immunodeficiency virus [HIV] disease: Secondary | ICD-10-CM | POA: Insufficient documentation

## 2018-08-06 DIAGNOSIS — R718 Other abnormality of red blood cells: Secondary | ICD-10-CM | POA: Insufficient documentation

## 2018-08-06 DIAGNOSIS — N920 Excessive and frequent menstruation with regular cycle: Secondary | ICD-10-CM | POA: Insufficient documentation

## 2018-08-06 DIAGNOSIS — D509 Iron deficiency anemia, unspecified: Secondary | ICD-10-CM | POA: Insufficient documentation

## 2018-08-06 DIAGNOSIS — D573 Sickle-cell trait: Secondary | ICD-10-CM | POA: Insufficient documentation

## 2018-08-06 DIAGNOSIS — D508 Other iron deficiency anemias: Secondary | ICD-10-CM

## 2018-08-06 DIAGNOSIS — D696 Thrombocytopenia, unspecified: Secondary | ICD-10-CM | POA: Insufficient documentation

## 2018-08-06 DIAGNOSIS — Z79899 Other long term (current) drug therapy: Secondary | ICD-10-CM

## 2018-08-06 LAB — CMP (CANCER CENTER ONLY)
ALT: 10 U/L (ref 0–44)
AST: 14 U/L — ABNORMAL LOW (ref 15–41)
Albumin: 3.4 g/dL — ABNORMAL LOW (ref 3.5–5.0)
Alkaline Phosphatase: 53 U/L (ref 38–126)
Anion gap: 9 (ref 5–15)
BUN: 8 mg/dL (ref 6–20)
CO2: 24 mmol/L (ref 22–32)
Calcium: 8.6 mg/dL — ABNORMAL LOW (ref 8.9–10.3)
Chloride: 105 mmol/L (ref 98–111)
Creatinine: 0.66 mg/dL (ref 0.44–1.00)
GFR, Est AFR Am: >60 mL/min (ref >60)
GFR, Estimated: >60 mL/min (ref >60)
Glucose, Bld: 79 mg/dL (ref 70–99)
Potassium: 3.9 mmol/L (ref 3.5–5.1)
Sodium: 138 mmol/L (ref 135–145)
Total Bilirubin: 0.6 mg/dL (ref 0.3–1.2)
Total Protein: 8.1 g/dL (ref 6.5–8.1)

## 2018-08-06 LAB — CBC WITH DIFFERENTIAL (CANCER CENTER ONLY)
Abs Immature Granulocytes: 0 10*3/uL (ref 0.00–0.07)
Basophils Absolute: 0.1 10*3/uL (ref 0.0–0.1)
Basophils Relative: 1 %
Eosinophils Absolute: 0.1 10*3/uL (ref 0.0–0.5)
Eosinophils Relative: 2 %
HCT: 29.9 % — ABNORMAL LOW (ref 36.0–46.0)
Hemoglobin: 8.5 g/dL — ABNORMAL LOW (ref 12.0–15.0)
Immature Granulocytes: 0 %
Lymphocytes Relative: 34 %
Lymphs Abs: 2 10*3/uL (ref 0.7–4.0)
MCH: 18.9 pg — ABNORMAL LOW (ref 26.0–34.0)
MCHC: 28.4 g/dL — ABNORMAL LOW (ref 30.0–36.0)
MCV: 66.4 fL — ABNORMAL LOW (ref 80.0–100.0)
Monocytes Absolute: 0.5 10*3/uL (ref 0.1–1.0)
Monocytes Relative: 9 %
Neutro Abs: 3.2 10*3/uL (ref 1.7–7.7)
Neutrophils Relative %: 54 %
Platelet Count: 200 10*3/uL (ref 150–400)
RBC: 4.5 MIL/uL (ref 3.87–5.11)
RDW: 21.7 % — ABNORMAL HIGH (ref 11.5–15.5)
WBC Count: 5.8 10*3/uL (ref 4.0–10.5)
nRBC: 0 % (ref 0.0–0.2)

## 2018-08-06 LAB — IRON AND TIBC
Iron: 14 ug/dL — ABNORMAL LOW (ref 41–142)
Saturation Ratios: 3 % — ABNORMAL LOW (ref 21–57)
TIBC: 448 ug/dL — ABNORMAL HIGH (ref 236–444)
UIBC: 434 ug/dL — ABNORMAL HIGH (ref 120–384)

## 2018-08-06 LAB — FERRITIN: Ferritin: <4 ng/mL — ABNORMAL LOW (ref 11–307)

## 2018-08-06 LAB — LACTATE DEHYDROGENASE: LDH: 97 U/L — ABNORMAL LOW (ref 98–192)

## 2018-08-06 NOTE — Progress Notes (Signed)
Referring Physician:  Janene Madeira, NP Diagnosis Other iron deficiency anemia - Plan: CBC with Differential ([place] Only), CMP ([place] only), Lactate dehydrogenase (LDH), Ferritin, Iron and TIBC, SPEP with reflex to IFE, Hemoglobinopathy evaluation  Staging Cancer Staging No matching staging information was found for the patient.  Assessment and Plan:  1.  Iron deficiency anemia.  48 year old female referred for evaluation due to Iron deficiency anemia.  Pt has been on oral iron but has an intolerance to the medication.  She reports heavy menstrual bleeding.  Pt had labs done 3/10/202 that showed WBC 5.8 HB 7.7 plts 541,000.  Ferritin in 03/2018 was 4.  Pt is HIV + and reports compliance with medications and is followed by NP Janene Madeira.  Pt had Mammogram and USN done 04/09/2018 that showed IMPRESSION: 1. Right breast duct ectasia with a dilated duct in the 7 o'clock position containing 2 intraductal masses, as described above. 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 2. The palpable mass in the 4 o'clock position of the right breast is normal dense glandular tissue with a convex anterior margin.   Pt underwent right breast core biopsy on 04/19/2018 with pathology returning as fibrocystic changes and intraductal papilloma.    Pt underwent right breast lumpectomy on 06/23/2018 with pathology returning as:  Breast, lumpectomy, Right w/seed - BIOPSY SITE CHANGES. INTRADUCTAL PAPILLOMA(S). RADIAL SCAR. ADENOSIS. USUAL TO MODERATE DUCT EPITHELIAL HYPERPLASIA. FIBROCYSTIC CHANGES.  Pt is seen today for consultation due to Iron deficiency anemia.    Labs done today 08/06/2018 reviewed and showed WBC 5.8 HB 8.5 plts 200,000.  Ferritin < 4.  Chemistries WNL with K+ 3.9 Cr 0.66 and normal LFTs. Awaiting results of SPEP and HB electrophoresis.    Pt has persistent iron deficiency anemia.  She has an intolerance to oral iron.  She is recommended for IV iron with Feraheme 510 mg IV D1  and D8.  Side effects of IV iron reviewed with pt and include potential allergic reaction.    Pt will RTC in 09/2018 for repeat labs and follow-up after IV iron.  Suspect etiology due to menorrhagia.    2.  Menorrhagia.  Likely etiology of IDA.  Pt should follow-up with PCP or GYN if ongoing symptoms.    3.  Microcytosis.  MCV 66.  Will check HB electrophoresis.    4.  HIV.  Follow-up with ID as recommended.  Pt reports compliance with medications.    5.  Breast papilloma.  Pt had surgery with Dr. Barry Dienes on 06/23/2018 with benign findings.  Follow-up with Dr. Barry Dienes as recommended.    40 minutes spent with more than 50% spent in review of records, counseling and coordination of care.    HPI:  48 year old female referred for evaluation due to Iron deficiency anemia.  Pt has been on oral iron but has an intolerance to the medication.  She reports heavy menstrual bleeding.  Pt had labs done  3/10/202 that showed WBC 5.8 HB 7.7 plts 541,000.  Ferritin in 03/2018 was 4.  Pt is HIV + and reports compliance with medications and is followed by NP Janene Madeira.  Pt had Mammogram and USN done 04/09/2018 that showed IMPRESSION: 1. Right breast duct ectasia with a dilated duct in the 7 o'clock position containing 2 intraductal masses, as described above. 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 2. The palpable mass in the 4 o'clock position of the right breast is normal dense glandular tissue with a convex anterior margin.  Pt underwent right breast core biopsy on 04/19/2018 with pathology returning as fibrocystic changes and intraductal papilloma.    Pt underwent right breast lumpectomy on 06/23/2018 with pathology returning as:  Breast, lumpectomy, Right w/seed - BIOPSY SITE CHANGES. INTRADUCTAL PAPILLOMA(S). RADIAL SCAR. ADENOSIS. USUAL TO MODERATE DUCT EPITHELIAL HYPERPLASIA. FIBROCYSTIC CHANGES.  Pt is seen today for consultation due to Iron deficiency anemia.      Problem List Patient  Active Problem List   Diagnosis Date Noted  . HIV (human immunodeficiency virus infection) ([place]) [B20] 07/14/2018  . 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 Trichomoniasis [A59.9] 04/29/2018  . Vaginal discharge [N89.8] 03/22/2018  . Anorexia [R63.0] 01/05/2018  . Thrombocytopenia ([place]) [D69.6] 12/12/2017  . Routine health maintenance [Z00.00] 08/28/2016  . AIDS ([place]) [B20] 02/02/2016  . H/O menorrhagia [Z87.42] 09/05/2014  . Iron deficiency anemia [D50.9] 09/05/2014  . Sickle-cell trait ([place]) [D57.3] 06/04/2006    Past Medical History Past Medical History:  Diagnosis Date  . Anemia   . GERD (gastroesophageal reflux disease)    no meds  . Heart murmur   . HIV disease [facility])     Past Surgical History Past Surgical History:  Procedure Laterality Date  . CESAREAN SECTION    . ESOPHAGOGASTRODUODENOSCOPY N/A 02/03/2016   Procedure: ESOPHAGOGASTRODUODENOSCOPY (EGD);  Surgeon: Manus Gunning, MD;  Location: [facility];  Service: Gastroenterology;  Laterality: N/A;  . RADIOACTIVE SEED GUIDED EXCISIONAL BREAST BIOPSY Right 06/23/2018   Procedure: RADIOACTIVE SEED GUIDED EXCISIONAL RIGHT BREAST BIOPSY;  Surgeon: [place] Klein, MD;  Location: [facility];  Service: General;  Laterality: Right;    Family History Family History  Problem Relation Age of Onset  . Hyperlipidemia Mother   . Hypertension Mother   . Cancer Father     Denies family history of leukemias or lymphomas.    Social History  reports that she has never smoked. She has never used smokeless tobacco. She reports current alcohol use of about 1.0 standard drinks of alcohol per week. She reports previous drug use. Drug: Marijuana.  Medications  Current Outpatient Medications:  .  BIKTARVY 50-200-25 MG TABS tablet, TAKE 1 TABLET BY MOUTH DAILY., Disp: 30 tablet, Rfl: 5 .  ferrous sulfate 325 (65 FE) MG EC tablet, Take 325 mg by mouth 2 (two) times daily with a meal., Disp: , Rfl:    Allergies Patient has no known allergies.  Review of Systems Review of Systems - Oncology ROS negative other than menorrhagia   Physical Exam  Vitals Wt Readings from Last 3 Encounters:  08/06/18 104 lb 11.2 oz (47.5 kg)  06/23/18 102 lb 11.8 oz (46.6 kg)  05/24/18 101 lb 3.2 oz (45.9 kg)   Temp Readings from Last 3 Encounters:  08/06/18 97.7 F (36.5 C) (Oral)  06/23/18 97.7 F (36.5 C)  03/22/18 98 F (36.7 C)   BP Readings from Last 3 Encounters:  08/06/18 134/71  06/23/18 (!) 154/84  05/24/18 (!) 152/88   Pulse Readings from Last 3 Encounters:  08/06/18 80  06/23/18 77  05/24/18 81   Constitutional: Well-developed, well-nourished, and in no distress.   HENT: Head: Normocephalic and atraumatic.  Mouth/Throat: No oropharyngeal exudate. Mucosa moist. Eyes: Pupils are equal, round, and reactive to light. Conjunctivae are normal. No scleral icterus.  Neck: Normal range of motion. Neck supple. No JVD present.  Cardiovascular: Normal rate, regular rhythm and normal heart sounds.  Exam reveals no gallop and no friction rub.   No murmur heard. Pulmonary/Chest:  Effort normal and breath sounds normal. No respiratory distress. No wheezes.No rales.  Abdominal: Soft. Bowel sounds are normal. No distension. There is no tenderness. There is no guarding.  Musculoskeletal: No edema or tenderness.  Lymphadenopathy: No cervical,axillary or supraclavicular adenopathy.  Neurological: Alert and oriented to person, place, and time. No cranial nerve deficit.  Skin: Skin is warm and dry. No rash noted. No erythema. No pallor.  Psychiatric: Affect and judgment normal.   Labs Appointment on 08/06/2018  Component Date Value Ref Range Status  . Iron 08/06/2018 14* 41 - 142 ug/dL Final  . TIBC 08/06/2018 448* 236 - 444 ug/dL Final  . Saturation Ratios 08/06/2018 3* 21 - 57 % Final  . UIBC 08/06/2018 434* 120 - 384 ug/dL Final   Performed at [facility], [place] [address] [place], [place] [address]  . Ferritin 08/06/2018 <4* 11 - 307 ng/mL Final   Performed at [facility], [place] [address], [place], [place] [address]  . LDH 08/06/2018 97* 98 - 192 U/L Final   Performed at [facility], [place] [address] [place], [place] [address]  . Sodium 08/06/2018 138  135 - 145 mmol/L Final  . Potassium 08/06/2018 3.9  3.5 - 5.1 mmol/L Final  . Chloride 08/06/2018 105  98 - 111 mmol/L Final  . CO2 08/06/2018 24  22 - 32 mmol/L Final  . Glucose, Bld 08/06/2018 79  70 - 99 mg/dL Final  . BUN 08/06/2018 8  6 - 20 mg/dL Final  . Creatinine 08/06/2018 0.66  0.44 - 1.00 mg/dL Final  . Calcium 08/06/2018 8.6* 8.9 - 10.3 mg/dL Final  . Total Protein 08/06/2018 8.1  6.5 - 8.1 g/dL Final  . Albumin 08/06/2018 3.4* 3.5 - 5.0 g/dL Final  . AST 08/06/2018 14* 15 - 41 U/L Final  . ALT 08/06/2018 10  0 - 44 U/L Final  . Alkaline Phosphatase 08/06/2018 53  38 - 126 U/L Final  . Total Bilirubin 08/06/2018 0.6  0.3 - 1.2 mg/dL Final  . GFR, Est Non Af Am 08/06/2018 >60  >60 mL/min Final  . GFR, Est AFR Am 08/06/2018 >60  >60 mL/min Final  . Anion gap 08/06/2018 9  5 - 15 Final   Performed at [facility], [place] [address], [place], [place] [address]  . WBC Count 08/06/2018 5.8  4.0 - 10.5 K/uL Final  . RBC 08/06/2018 4.50  3.87 - 5.11 MIL/uL Final  . Hemoglobin 08/06/2018 8.5* 12.0 - 15.0 g/dL Final   Comment: Reticulocyte Hemoglobin testing may be clinically indicated, consider ordering this additional test [id number]   . HCT 08/06/2018 29.9* 36.0 - 46.0 % Final  . MCV 08/06/2018 66.4* 80.0 - 100.0 fL Final  . MCH 08/06/2018 18.9* 26.0 - 34.0 pg Final  . MCHC 08/06/2018 28.4* 30.0 - 36.0 g/dL Final  . RDW 08/06/2018 21.7* 11.5 - 15.5 % Final  . Platelet Count 08/06/2018 200  150 - 400 K/uL Final  . nRBC 08/06/2018 0.0  0.0 - 0.2 % Final  . Neutrophils Relative % 08/06/2018 54  % Final  . Neutro  Abs 08/06/2018 3.2  1.7 - 7.7 K/uL Final  . Lymphocytes Relative 08/06/2018 34  % Final  . Lymphs Abs 08/06/2018 2.0  0.7 - 4.0 K/uL Final  . Monocytes Relative 08/06/2018 9  % Final  . Monocytes Absolute 08/06/2018 0.5  0.1 - 1.0 K/uL Final  . Eosinophils Relative 08/06/2018 2  %  Final  . Eosinophils Absolute 08/06/2018 0.1  0.0 - 0.5 K/uL Final  . Basophils Relative 08/06/2018 1  % Final  . Basophils Absolute 08/06/2018 0.1  0.0 - 0.1 K/uL Final  . Immature Granulocytes 08/06/2018 0  % Final  . Abs Immature Granulocytes 08/06/2018 0.00  0.00 - 0.07 K/uL Final   Performed at [facility], [place] [address] [place], [place] [address]     Pathology Orders Placed This Encounter  Procedures  . CBC with Differential ([facility] Only)    Standing Status:   Future    Number of Occurrences:   1    Standing Expiration Date:   08/06/2019  . CMP ([place] only)    Standing Status:   Future    Number of Occurrences:   1    Standing Expiration Date:   08/06/2019  . Lactate dehydrogenase (LDH)    Standing Status:   Future    Number of Occurrences:   1    Standing Expiration Date:   08/06/2019  . Ferritin    Standing Status:   Future    Number of Occurrences:   1    Standing Expiration Date:   08/06/2019  . Iron and TIBC    Standing Status:   Future    Number of Occurrences:   1    Standing Expiration Date:   08/06/2019  . SPEP with reflex to IFE    Standing Status:   Future    Number of Occurrences:   1    Standing Expiration Date:   08/06/2019  . Hemoglobinopathy evaluation    Standing Status:   Future    Number of Occurrences:   1    Standing Expiration Date:   08/06/2019       Zoila Shutter MD

## 2018-08-06 NOTE — Telephone Encounter (Signed)
TCT patient regarding lab work from today.  Spoke with pt and advised her that her iron levels were low. Dr. Walden Field has ordered IV fereheme x2 , a week apart.  Informed patient that a scheduler would call to let her know the date and time for these infusions. Pt voiced understanding.  Scheduling message sent

## 2018-08-09 ENCOUNTER — Telehealth: Payer: Self-pay | Admitting: Internal Medicine

## 2018-08-09 LAB — HEMOGLOBINOPATHY EVALUATION
Hgb A2 Quant: 3.4 % — ABNORMAL HIGH (ref 1.8–3.2)
Hgb A: 64.4 % — ABNORMAL LOW (ref 96.4–98.8)
Hgb C: 0 %
Hgb F Quant: 0 % (ref 0.0–2.0)
Hgb S Quant: 32.2 % — ABNORMAL HIGH
Hgb Variant: 0 %

## 2018-08-09 LAB — PROTEIN ELECTROPHORESIS, SERUM, WITH REFLEX
A/G Ratio: 0.9 (ref 0.7–1.7)
Albumin ELP: 3.5 g/dL (ref 2.9–4.4)
Alpha-1-Globulin: 0.2 g/dL (ref 0.0–0.4)
Alpha-2-Globulin: 0.5 g/dL (ref 0.4–1.0)
Beta Globulin: 1.3 g/dL (ref 0.7–1.3)
Gamma Globulin: 2.1 g/dL — ABNORMAL HIGH (ref 0.4–1.8)
Globulin, Total: 4 g/dL — ABNORMAL HIGH (ref 2.2–3.9)
Total Protein ELP: 7.5 g/dL (ref 6.0–8.5)

## 2018-08-09 NOTE — Telephone Encounter (Signed)
Called regarding schedule °

## 2018-08-11 ENCOUNTER — Other Ambulatory Visit: Payer: Self-pay | Admitting: Internal Medicine

## 2018-08-16 ENCOUNTER — Inpatient Hospital Stay: Payer: Self-pay | Attending: Internal Medicine

## 2018-08-16 ENCOUNTER — Other Ambulatory Visit: Payer: Self-pay

## 2018-08-16 VITALS — BP 141/78 | HR 78 | Temp 98.6°F | Resp 17

## 2018-08-16 DIAGNOSIS — D509 Iron deficiency anemia, unspecified: Secondary | ICD-10-CM | POA: Insufficient documentation

## 2018-08-16 DIAGNOSIS — D500 Iron deficiency anemia secondary to blood loss (chronic): Secondary | ICD-10-CM

## 2018-08-16 DIAGNOSIS — N920 Excessive and frequent menstruation with regular cycle: Secondary | ICD-10-CM | POA: Insufficient documentation

## 2018-08-16 MED ORDER — SODIUM CHLORIDE 0.9 % IV SOLN
510.0000 mg | Freq: Once | INTRAVENOUS | Status: AC
  Administered 2018-08-16: 510 mg via INTRAVENOUS
  Filled 2018-08-16: qty 17

## 2018-08-16 MED ORDER — SODIUM CHLORIDE 0.9 % IV SOLN
Freq: Once | INTRAVENOUS | Status: AC
  Administered 2018-08-16: 09:00:00 via INTRAVENOUS
  Filled 2018-08-16: qty 250

## 2018-08-16 NOTE — Patient Instructions (Signed)

## 2018-08-23 ENCOUNTER — Inpatient Hospital Stay: Payer: Self-pay

## 2018-08-23 ENCOUNTER — Other Ambulatory Visit: Payer: Self-pay

## 2018-08-23 VITALS — BP 117/62 | HR 72 | Temp 99.8°F | Resp 16

## 2018-08-23 DIAGNOSIS — D500 Iron deficiency anemia secondary to blood loss (chronic): Secondary | ICD-10-CM

## 2018-08-23 MED ORDER — SODIUM CHLORIDE 0.9 % IV SOLN
510.0000 mg | Freq: Once | INTRAVENOUS | Status: AC
  Administered 2018-08-23: 11:00:00 510 mg via INTRAVENOUS
  Filled 2018-08-23: qty 17

## 2018-08-23 MED ORDER — SODIUM CHLORIDE 0.9 % IV SOLN
Freq: Once | INTRAVENOUS | Status: AC
  Administered 2018-08-23: 10:00:00 via INTRAVENOUS
  Filled 2018-08-23: qty 250

## 2018-08-23 NOTE — Patient Instructions (Signed)

## 2018-08-24 MED FILL — BIKTARVY 50-200-25 MG TABS: 50-200-25 | 30 days supply | Qty: 30 | Fill #3

## 2018-09-20 MED FILL — BIKTARVY 50-200-25 MG TABS: 50-200-25 | 30 days supply | Qty: 30 | Fill #4

## 2018-09-21 ENCOUNTER — Encounter: Payer: Self-pay | Admitting: *Deleted

## 2018-09-21 NOTE — Telephone Encounter (Signed)
-----   Message from Thayer Headings, MD sent at 09/01/2018 12:39 PM EDT ----- Sounds good, Thanks! ----- Message ----- From: Loma Boston, RN Sent: 09/01/2018  10:20 AM EDT To: Thayer Headings, MD  See may not need me but I wanted to be available with current orders just in case.

## 2018-09-23 ENCOUNTER — Inpatient Hospital Stay (HOSPITAL_BASED_OUTPATIENT_CLINIC_OR_DEPARTMENT_OTHER): Payer: Self-pay | Admitting: Internal Medicine

## 2018-09-23 ENCOUNTER — Other Ambulatory Visit: Payer: Self-pay

## 2018-09-23 ENCOUNTER — Inpatient Hospital Stay: Payer: Self-pay | Attending: Internal Medicine

## 2018-09-23 VITALS — BP 156/80 | HR 72 | Temp 99.1°F | Resp 18 | Ht 62.5 in | Wt 112.3 lb

## 2018-09-23 DIAGNOSIS — D249 Benign neoplasm of unspecified breast: Secondary | ICD-10-CM

## 2018-09-23 DIAGNOSIS — Z809 Family history of malignant neoplasm, unspecified: Secondary | ICD-10-CM

## 2018-09-23 DIAGNOSIS — N6314 Unspecified lump in the right breast, lower inner quadrant: Secondary | ICD-10-CM | POA: Insufficient documentation

## 2018-09-23 DIAGNOSIS — N6313 Unspecified lump in the right breast, lower outer quadrant: Secondary | ICD-10-CM | POA: Insufficient documentation

## 2018-09-23 DIAGNOSIS — D573 Sickle-cell trait: Secondary | ICD-10-CM | POA: Insufficient documentation

## 2018-09-23 DIAGNOSIS — D508 Other iron deficiency anemias: Secondary | ICD-10-CM

## 2018-09-23 DIAGNOSIS — B20 Human immunodeficiency virus [HIV] disease: Secondary | ICD-10-CM | POA: Insufficient documentation

## 2018-09-23 DIAGNOSIS — Z8249 Family history of ischemic heart disease and other diseases of the circulatory system: Secondary | ICD-10-CM

## 2018-09-23 DIAGNOSIS — D696 Thrombocytopenia, unspecified: Secondary | ICD-10-CM

## 2018-09-23 DIAGNOSIS — N920 Excessive and frequent menstruation with regular cycle: Secondary | ICD-10-CM

## 2018-09-23 DIAGNOSIS — Z83438 Family history of other disorder of lipoprotein metabolism and other lipidemia: Secondary | ICD-10-CM | POA: Insufficient documentation

## 2018-09-23 DIAGNOSIS — D500 Iron deficiency anemia secondary to blood loss (chronic): Secondary | ICD-10-CM | POA: Insufficient documentation

## 2018-09-23 LAB — CBC WITH DIFFERENTIAL (CANCER CENTER ONLY)
Abs Immature Granulocytes: 0 10*3/uL (ref 0.00–0.07)
Basophils Absolute: 0.1 10*3/uL (ref 0.0–0.1)
Basophils Relative: 2 %
Eosinophils Absolute: 0.1 10*3/uL (ref 0.0–0.5)
Eosinophils Relative: 2 %
HCT: 39.8 % (ref 36.0–46.0)
Hemoglobin: 12.6 g/dL (ref 12.0–15.0)
Immature Granulocytes: 0 %
Lymphocytes Relative: 37 %
Lymphs Abs: 1.7 10*3/uL (ref 0.7–4.0)
MCH: 26 pg (ref 26.0–34.0)
MCHC: 31.7 g/dL (ref 30.0–36.0)
MCV: 82.1 fL (ref 80.0–100.0)
Monocytes Absolute: 0.5 10*3/uL (ref 0.1–1.0)
Monocytes Relative: 12 %
Neutro Abs: 2.1 10*3/uL (ref 1.7–7.7)
Neutrophils Relative %: 47 %
Platelet Count: 227 10*3/uL (ref 150–400)
RBC: 4.85 MIL/uL (ref 3.87–5.11)
WBC Count: 4.5 10*3/uL (ref 4.0–10.5)
nRBC: 0 % (ref 0.0–0.2)

## 2018-09-23 LAB — CMP (CANCER CENTER ONLY)
ALT: 13 U/L (ref 0–44)
AST: 13 U/L — ABNORMAL LOW (ref 15–41)
Albumin: 3.5 g/dL (ref 3.5–5.0)
Alkaline Phosphatase: 52 U/L (ref 38–126)
Anion gap: 8 (ref 5–15)
BUN: 8 mg/dL (ref 6–20)
CO2: 22 mmol/L (ref 22–32)
Calcium: 8.2 mg/dL — ABNORMAL LOW (ref 8.9–10.3)
Chloride: 108 mmol/L (ref 98–111)
Creatinine: 0.64 mg/dL (ref 0.44–1.00)
GFR, Est AFR Am: >60 mL/min (ref >60)
GFR, Estimated: >60 mL/min (ref >60)
Glucose, Bld: 84 mg/dL (ref 70–99)
Potassium: 3.7 mmol/L (ref 3.5–5.1)
Sodium: 138 mmol/L (ref 135–145)
Total Bilirubin: 0.6 mg/dL (ref 0.3–1.2)
Total Protein: 7.6 g/dL (ref 6.5–8.1)

## 2018-09-23 LAB — FERRITIN: Ferritin: 45 ng/mL (ref 11–307)

## 2018-09-23 LAB — LACTATE DEHYDROGENASE: LDH: 102 U/L (ref 98–192)

## 2018-09-23 NOTE — Progress Notes (Signed)
Diagnosis Iron deficiency anemia due to chronic blood loss - Plan: CBC with Differential ([facility] Only), CMP ([facility] only), Lactate dehydrogenase (LDH), Ferritin, Ambulatory referral to Gastroenterology  Staging Cancer Staging No matching staging information was found for the patient.  Assessment and Plan:   1.  Iron deficiency anemia (IDA).  48 year old female referred for evaluation due to Iron deficiency anemia.  Pt has been on oral iron but has an intolerance to the medication.  She reports heavy menstrual bleeding.  Pt had labs done 3/10/202 that showed WBC 5.8 HB 7.7 plts 541,000.  Ferritin in 03/2018 was 4.  Pt is HIV + and reports compliance with medications and is followed by NP Janene Madeira.  Pt had Mammogram and USN done 04/09/2018 that showed IMPRESSION: 1. Right breast duct ectasia with a dilated duct in the 7 o'clock position containing 2 intraductal masses, as described above. 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 2. The palpable mass in the 4 o'clock position of the right breast is normal dense glandular tissue with a convex anterior margin.   Pt underwent right breast core biopsy on 04/19/2018 with pathology returning as fibrocystic changes and intraductal papilloma.    Pt underwent right breast lumpectomy on 06/23/2018 with pathology returning as:  Breast, lumpectomy, Right w/seed - BIOPSY SITE CHANGES. INTRADUCTAL PAPILLOMA(S). RADIAL SCAR. ADENOSIS. USUAL TO MODERATE DUCT EPITHELIAL HYPERPLASIA. FIBROCYSTIC CHANGES.  Labs done 08/06/2018 reviewed and showed WBC 5.8 HB 8.5 plts 200,000.  Ferritin < 4.  Chemistries WNL with K+ 3.9 Cr 0.66 and normal LFTs. She has negative SPEP.    Pt was treated with IV iron on 08/16/2018 and 08/23/2018.    Labs done today 09/23/2018 reviewed and showed WBC 4.5 HB 12.6 plts 227,000.  Chemistries showed K= 3.7 Cr 0.64 normal LFTs.  Ferritin improved at 45.  HB improved from 8.5 to 12.6.  Will  repeat labs in 03/2019.  Pt  referred to GI for evaluation.    2.  Menorrhagia.  Likely etiology of IDA.  Pt should follow-up with PCP or GYN if ongoing symptoms.  Hb improved at 12.6.    3.  Microcytosis.  MCV improved from 66 to 82 after IV iron.  HB electrophoresis consistent with [place] trait.  Likely due to IDA.    4.  Sickle cell trait.  Hb electrophoresis done 08/06/2018 confirmed diagnosis.   Sickle cell trait is a benign carrier condition for the sickle hemoglobin mutation.  Treatment not recommended.    5.  HIV.  Follow-up with ID as recommended.  Pt reports compliance with medications.    6.  Breast papilloma.  Pt had surgery with Dr. Barry Dienes on 06/23/2018 with benign findings.  Follow-up with Dr. Barry Dienes as recommended.    25 minutes spent with more than 50% spent in counseling and coordination of care.    Interval history:  Historical data obtained from note dated 08/06/2018.  48 year old female referred for evaluation due to Iron deficiency anemia.  Pt has been on oral iron but has an intolerance to the medication.  She reports heavy menstrual bleeding.  Pt had labs done  3/10/202 that showed WBC 5.8 HB 7.7 plts 541,000.  Ferritin in 03/2018 was 4.  Pt is HIV + and reports compliance with medications and is followed by NP Janene Madeira.  Pt had Mammogram and USN done 04/09/2018 that showed IMPRESSION: 1. Right breast duct ectasia with a dilated duct in the 7 o'clock position containing 2 intraductal masses, as  described above. 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 2. The palpable mass in the 4 o'clock position of the right breast is normal dense glandular tissue with a convex anterior margin.   Pt underwent right breast core biopsy on 04/19/2018 with pathology returning as fibrocystic changes and intraductal papilloma.    Pt underwent right breast lumpectomy on 06/23/2018 with pathology returning as:  Breast, lumpectomy, Right w/seed - BIOPSY SITE CHANGES. INTRADUCTAL PAPILLOMA(S). RADIAL SCAR. ADENOSIS. USUAL  TO MODERATE DUCT EPITHELIAL HYPERPLASIA. FIBROCYSTIC CHANGES.  Current Status:  Pt is seen today for follow-up after IV iron.  She is here to go over labs.    Problem List Patient Active Problem List   Diagnosis Date Noted  . HIV (human immunodeficiency virus infection) ([place]) [B20] 07/14/2018  . 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 Trichomoniasis [A59.9] 04/29/2018  . Vaginal discharge [N89.8] 03/22/2018  . Anorexia [R63.0] 01/05/2018  . Thrombocytopenia ([place]) [D69.6] 12/12/2017  . Routine health maintenance [Z00.00] 08/28/2016  . AIDS ([place]) [B20] 02/02/2016  . H/O menorrhagia [Z87.42] 09/05/2014  . Iron deficiency anemia [D50.9] 09/05/2014  . Sickle-cell trait ([place]) [D57.3] 06/04/2006    Past Medical History Past Medical History:  Diagnosis Date  . Anemia   . GERD (gastroesophageal reflux disease)    no meds  . Heart murmur   . HIV disease [facility])     Past Surgical History Past Surgical History:  Procedure Laterality Date  . CESAREAN SECTION    . ESOPHAGOGASTRODUODENOSCOPY N/A 02/03/2016   Procedure: ESOPHAGOGASTRODUODENOSCOPY (EGD);  Surgeon: Manus Gunning, MD;  Location: [facility];  Service: Gastroenterology;  Laterality: N/A;  . RADIOACTIVE SEED GUIDED EXCISIONAL BREAST BIOPSY Right 06/23/2018   Procedure: RADIOACTIVE SEED GUIDED EXCISIONAL RIGHT BREAST BIOPSY;  Surgeon: Stark Klein, MD;  Location: [facility];  Service: General;  Laterality: Right;    Family History Family History  Problem Relation Age of Onset  . Hyperlipidemia Mother   . Hypertension Mother   . Cancer Father   . Leukemia Neg Hx   . Lymphoma Neg Hx      Social History  reports that she has never smoked. She has never used smokeless tobacco. She reports current alcohol use of about 1.0 standard drinks of alcohol per week. She reports previous drug use. Drug: Marijuana.  Medications  Current Outpatient Medications:  .  BIKTARVY 50-200-25 MG TABS  tablet, TAKE 1 TABLET BY MOUTH DAILY., Disp: 30 tablet, Rfl: 5 .  ferrous sulfate 325 (65 FE) MG EC tablet, Take 325 mg by mouth 2 (two) times daily with a meal., Disp: , Rfl:   Allergies Patient has no known allergies.  Review of Systems Review of Systems - Oncology ROS negative   Physical Exam  Vitals Wt Readings from Last 3 Encounters:  09/23/18 112 lb 4.8 oz (50.9 kg)  08/06/18 104 lb 11.2 oz (47.5 kg)  06/23/18 102 lb 11.8 oz (46.6 kg)   Temp Readings from Last 3 Encounters:  09/23/18 99.1 F (37.3 C) (Oral)  08/23/18 99.8 F (37.7 C) (Oral)  08/16/18 98.6 F (37 C) (Oral)   BP Readings from Last 3 Encounters:  09/23/18 (!) 156/80  08/23/18 117/62  08/16/18 (!) 141/78   Pulse Readings from Last 3 Encounters:  09/23/18 72  08/23/18 72  08/16/18 78   Constitutional: Well-developed, well-nourished, and in no distress.   HENT: Head: Normocephalic and atraumatic.  Mouth/Throat: No oropharyngeal exudate. Mucosa moist. Eyes: Pupils are equal, round, and reactive to light. Conjunctivae  are normal. No scleral icterus.  Neck: Normal range of motion. Neck supple. No JVD present.  Cardiovascular: Normal rate, regular rhythm and normal heart sounds.  Exam reveals no gallop and no friction rub.   No murmur heard. Pulmonary/Chest: Effort normal and breath sounds normal. No respiratory distress. No wheezes.No rales.  Abdominal: Soft. Bowel sounds are normal. No distension. There is no tenderness. There is no guarding.  Musculoskeletal: No edema or tenderness.  Lymphadenopathy: No cervical, axillary or supraclavicular adenopathy.  Neurological: Alert and oriented to person, place, and time. No cranial nerve deficit.  Skin: Skin is warm and dry. No rash noted. No erythema. No pallor.  Psychiatric: Affect and judgment normal.   Labs Appointment on 09/23/2018  Component Date Value Ref Range Status  . LDH 09/23/2018 102  98 - 192 U/L Final   Performed at [facility], [place] [address] [place], [place] [address]  . Ferritin 09/23/2018 45  11 - 307 ng/mL Final   Performed at [facility], [place] [address] [place], [place] [address]  . Sodium 09/23/2018 138  135 - 145 mmol/L Final  . Potassium 09/23/2018 3.7  3.5 - 5.1 mmol/L Final  . Chloride 09/23/2018 108  98 - 111 mmol/L Final  . CO2 09/23/2018 22  22 - 32 mmol/L Final  . Glucose, Bld 09/23/2018 84  70 - 99 mg/dL Final  . BUN 09/23/2018 8  6 - 20 mg/dL Final  . Creatinine 09/23/2018 0.64  0.44 - 1.00 mg/dL Final  . Calcium 09/23/2018 8.2* 8.9 - 10.3 mg/dL Final  . Total Protein 09/23/2018 7.6  6.5 - 8.1 g/dL Final  . Albumin 09/23/2018 3.5  3.5 - 5.0 g/dL Final  . AST 09/23/2018 13* 15 - 41 U/L Final  . ALT 09/23/2018 13  0 - 44 U/L Final  . Alkaline Phosphatase 09/23/2018 52  38 - 126 U/L Final  . Total Bilirubin 09/23/2018 0.6  0.3 - 1.2 mg/dL Final  . GFR, Est Non Af Am 09/23/2018 >60  >60 mL/min Final  . GFR, Est AFR Am 09/23/2018 >60  >60 mL/min Final  . Anion gap 09/23/2018 8  5 - 15 Final   Performed at [facility], [place] [address], [place], [place] [address]  . WBC Count 09/23/2018 4.5  4.0 - 10.5 K/uL Final  . RBC 09/23/2018 4.85  3.87 - 5.11 MIL/uL Final  . Hemoglobin 09/23/2018 12.6  12.0 - 15.0 g/dL Final  . HCT 09/23/2018 39.8  36.0 - 46.0 % Final  . MCV 09/23/2018 82.1  80.0 - 100.0 fL Final  . MCH 09/23/2018 26.0  26.0 - 34.0 pg Final  . MCHC 09/23/2018 31.7  30.0 - 36.0 g/dL Final  . RDW 09/23/2018 Not Measured  11.5 - 15.5 % Final  . Platelet Count 09/23/2018 227  150 - 400 K/uL Final  . nRBC 09/23/2018 0.0  0.0 - 0.2 % Final  . Neutrophils Relative % 09/23/2018 47  % Final  . Neutro Abs 09/23/2018 2.1  1.7 - 7.7 K/uL Final  . Lymphocytes Relative 09/23/2018 37  % Final  . Lymphs Abs 09/23/2018 1.7  0.7 - 4.0 K/uL Final  . Monocytes Relative 09/23/2018 12  % Final  . Monocytes Absolute 09/23/2018 0.5  0.1 -  1.0 K/uL Final  . Eosinophils Relative 09/23/2018 2  % Final  . Eosinophils Absolute 09/23/2018 0.1  0.0 - 0.5 K/uL Final  . Basophils Relative 09/23/2018 2  %  Final  . Basophils Absolute 09/23/2018 0.1  0.0 - 0.1 K/uL Final  . Immature Granulocytes 09/23/2018 0  % Final  . Abs Immature Granulocytes 09/23/2018 0.00  0.00 - 0.07 K/uL Final   Performed at [facility], [place] [address], [place], [place] [address]     Pathology Orders Placed This Encounter  Procedures  . CBC with Differential (Cancer Center Only)    Standing Status:   Future    Standing Expiration Date:   09/23/2019  . CMP ([facility] only)    Standing Status:   Future    Standing Expiration Date:   09/23/2019  . Lactate dehydrogenase (LDH)    Standing Status:   Future    Standing Expiration Date:   09/23/2019  . Ferritin    Standing Status:   Future    Standing Expiration Date:   09/23/2019  . Ambulatory referral to Gastroenterology    Referral Priority:   Routine    Referral Type:   Consultation    Referral Reason:   Specialty Services Required    Number of Visits Requested:   1       Zoila Shutter MD

## 2018-09-24 ENCOUNTER — Telehealth: Payer: Self-pay | Admitting: Internal Medicine

## 2018-09-24 NOTE — Telephone Encounter (Signed)
I will schedule once template is available

## 2018-10-04 ENCOUNTER — Ambulatory Visit: Payer: Self-pay | Admitting: Nurse Practitioner

## 2018-10-07 ENCOUNTER — Encounter: Payer: Self-pay | Admitting: Nurse Practitioner

## 2018-10-07 ENCOUNTER — Ambulatory Visit (INDEPENDENT_AMBULATORY_CARE_PROVIDER_SITE_OTHER): Payer: Self-pay | Admitting: Nurse Practitioner

## 2018-10-07 ENCOUNTER — Telehealth: Payer: Self-pay

## 2018-10-07 VITALS — BP 128/78 | HR 82 | Temp 98.0°F | Ht 61.0 in | Wt 108.4 lb

## 2018-10-07 DIAGNOSIS — D509 Iron deficiency anemia, unspecified: Secondary | ICD-10-CM

## 2018-10-07 DIAGNOSIS — B20 Human immunodeficiency virus [HIV] disease: Secondary | ICD-10-CM

## 2018-10-07 MED ORDER — NA SULFATE-K SULFATE-MG SULF 17.5-3.13-1.6 GM/177ML PO SOLN: ORAL | 0 refills | Status: DC

## 2018-10-07 NOTE — Telephone Encounter (Signed)
Covid-19 screening questions   Do you now or have you had a fever in the last 14 days? No  Do you have any respiratory symptoms of shortness of breath or cough now or in the last 14 days? No  Do you have any family members or close contacts with diagnosed or suspected Covid-19 in the past 14 days? No  Have you been tested for Covid-19 and found to be positive? No        

## 2018-10-07 NOTE — Patient Instructions (Signed)
If you are age 47 or older, your body mass index should be between 23-30. Your Body mass index is 20.48 kg/m. If this is out of the aforementioned range listed, please consider follow up with your Primary Care Provider.  If you are age 41 or younger, your body mass index should be between 19-25. Your Body mass index is 20.48 kg/m. If this is out of the aformentioned range listed, please consider follow up with your Primary Care Provider.   You have been scheduled for an endoscopy and colonoscopy. Please follow the written instructions given to you at your visit today. Please pick up your prep supplies at the pharmacy within the next 1-3 days. If you use inhalers (even only as needed), please bring them with you on the day of your procedure. Your physician has requested that you go to www.[email or web address].com and enter the access code given to you at your visit today. This web site gives a general overview about your procedure. However, you should still follow specific instructions given to you by our office regarding your preparation for the procedure.  We have sent the following medications to your pharmacy for you to pick up at your convenience: [facility].  Thank you for choosing me and [facility].   Tye Savoy, NP

## 2018-10-07 NOTE — Progress Notes (Addendum)
     Chief Complaint:  anemia    IMPRESSION and PLAN:    48. 48 yo female with HIV eferred for chronic iron deficiency anemia. Significant improvement in hgb following IV iron infusion x 2 in May. No overt GI bleeding, no GI complaints.   -IDA possibly secondary to menorrhagia but will proceed with EGD and colonoscopy to rule out GI source of anemia. The risks and benefits of EGD and colonoscopy with possible polypectomy / biopsies were discussed and the patient agrees to proceed.  -If endoscopic evaluation negative may need referral to GYN  2. Sickle cell trait  3. HIV. On Biktarvy  HPI:     Patient is a 48 year old female referred by oncologist Dr. Walden Field for evaluation of iron deficiency anemia.  We actually met patient hospital October 2017 for evaluation of dysphagia.  EGD at the time showed oral candidiasis, exam was otherwise normal.  Patient has a history of chronic iron deficiency anemia.  Unable to tolerate oral iron.  She reports heavy menstrual cycles.  Has a cycle every 21 days, last 5 days but she bleeds heavily the whole time.  Once a month patient takes 2 ibuprofen, no other NSAIDs.  She has not had any overt GI bleeding.  No abdominal pain, no nausea or vomiting, no unexplained weight loss.   Review of systems:     No chest pain, no SOB, no fevers, no urinary sx   Past Medical History:  Diagnosis Date  . Anemia   . GERD (gastroesophageal reflux disease)    no meds  . Heart murmur   . HIV disease ([place])     Patient's surgical history, family medical history, social history, medications and allergies were all reviewed in Epic   Serum creatinine: 0.64 mg/dL 09/23/18 1000 Estimated creatinine clearance: 66.3 mL/min  Current Outpatient Medications  Medication Sig Dispense Refill  . BIKTARVY 50-200-25 MG TABS tablet TAKE 1 TABLET BY MOUTH DAILY. 30 tablet 5   No current facility-administered medications for this visit.     Physical Exam:     BP 128/78   Pulse  82   Temp 98 F (36.7 C) (Temporal)   Ht '5\' 1"'$  (1.549 m)   Wt 108 lb 6 oz (49.2 kg)   BMI 20.48 kg/m   GENERAL:  Pleasant female in NAD PSYCH: : Cooperative, normal affect EENT:  conjunctiva pink, mucous membranes moist, neck supple without masses CARDIAC:  RRR, +murmur, , no peripheral edema PULM: Normal respiratory effort, lungs CTA bilaterally, no wheezing ABDOMEN:  Nondistended, soft, nontender. No obvious masses, no hepatomegaly,  normal bowel sounds SKIN:  turgor, no lesions seen Musculoskeletal:  Normal muscle tone, normal strength NEURO: Alert and oriented x 3, no focal neurologic deficits   Tye Savoy , NP 10/07/2018, 10:09 AM  Cc: Zoila Shutter, MD

## 2018-10-14 ENCOUNTER — Telehealth: Payer: Self-pay | Admitting: Infectious Diseases

## 2018-10-14 NOTE — Telephone Encounter (Signed)
COVID-19 Pre-Screening Questions: ° °Do you currently have a fever (>100 °F), chills or unexplained body aches? No  ° °Are you currently experiencing new cough, shortness of breath, sore throat, runny nose? No  °•  °Have you recently travelled outside the state of [place] in the last 14 days? No  °•  °Have you been in contact with someone that is currently pending confirmation of Covid19 testing or has been confirmed to have the Covid19 virus?  No  °

## 2018-10-18 ENCOUNTER — Ambulatory Visit: Payer: Self-pay

## 2018-10-18 ENCOUNTER — Other Ambulatory Visit: Payer: Self-pay

## 2018-10-18 ENCOUNTER — Other Ambulatory Visit: Payer: Self-pay | Admitting: *Deleted

## 2018-10-18 DIAGNOSIS — B20 Human immunodeficiency virus [HIV] disease: Secondary | ICD-10-CM

## 2018-10-19 LAB — T-HELPER CELL (CD4) - (RCID CLINIC ONLY)
CD4 % Helper T Cell: 11 % — ABNORMAL LOW (ref 33–65)
CD4 T Cell Abs: 154 /uL — ABNORMAL LOW (ref 400–1790)

## 2018-10-26 LAB — COMPLETE METABOLIC PANEL WITH GFR
AG Ratio: 1 (calc) (ref 1.0–2.5)
ALT: 11 U/L (ref 6–29)
AST: 11 U/L (ref 10–35)
Albumin: 4 g/dL (ref 3.6–5.1)
Alkaline phosphatase (APISO): 50 U/L (ref 31–125)
BUN: 11 mg/dL (ref 7–25)
CO2: 26 mmol/L (ref 20–32)
Calcium: 9.3 mg/dL (ref 8.6–10.2)
Chloride: 105 mmol/L (ref 98–110)
Creat: 0.51 mg/dL (ref 0.50–1.10)
GFR, Est African American: 134 mL/min/{1.73_m2} (ref >=60)
GFR, Est Non African American: 115 mL/min/{1.73_m2} (ref >=60)
Globulin: 3.9 g/dL (calc) — ABNORMAL HIGH (ref 1.9–3.7)
Glucose, Bld: 91 mg/dL (ref 65–99)
Potassium: 4.5 mmol/L (ref 3.5–5.3)
Sodium: 137 mmol/L (ref 135–146)
Total Bilirubin: 0.9 mg/dL (ref 0.2–1.2)
Total Protein: 7.9 g/dL (ref 6.1–8.1)

## 2018-10-26 LAB — CBC WITH DIFFERENTIAL/PLATELET
Absolute Monocytes: 505 cells/uL (ref 200–950)
Basophils Absolute: 80 cells/uL (ref 0–200)
Basophils Relative: 1.6 %
Eosinophils Absolute: 170 cells/uL (ref 15–500)
Eosinophils Relative: 3.4 %
HCT: 40.1 % (ref 35.0–45.0)
Hemoglobin: 13.5 g/dL (ref 11.7–15.5)
Lymphs Abs: 1780 cells/uL (ref 850–3900)
MCH: 28.4 pg (ref 27.0–33.0)
MCHC: 33.7 g/dL (ref 32.0–36.0)
MCV: 84.2 fL (ref 80.0–100.0)
MPV: 10.7 fL (ref 7.5–12.5)
Monocytes Relative: 10.1 %
Neutro Abs: 2465 cells/uL (ref 1500–7800)
Neutrophils Relative %: 49.3 %
Platelets: 295 10*3/uL (ref 140–400)
RBC: 4.76 10*6/uL (ref 3.80–5.10)
RDW: 23.6 % — ABNORMAL HIGH (ref 11.0–15.0)
Total Lymphocyte: 35.6 %
WBC: 5 10*3/uL (ref 3.8–10.8)

## 2018-10-26 LAB — HIV-1 RNA QUANT-NO REFLEX-BLD
HIV 1 RNA Quant: <20 copies/mL
HIV-1 RNA Quant, Log: <1.3 Log copies/mL

## 2018-10-28 ENCOUNTER — Other Ambulatory Visit: Payer: Self-pay | Admitting: *Deleted

## 2018-10-28 DIAGNOSIS — Z20822 Contact with and (suspected) exposure to covid-19: Secondary | ICD-10-CM

## 2018-11-01 ENCOUNTER — Ambulatory Visit (INDEPENDENT_AMBULATORY_CARE_PROVIDER_SITE_OTHER): Payer: Self-pay | Admitting: Infectious Diseases

## 2018-11-01 ENCOUNTER — Other Ambulatory Visit: Payer: Self-pay

## 2018-11-01 ENCOUNTER — Encounter: Payer: Self-pay | Admitting: Infectious Diseases

## 2018-11-01 DIAGNOSIS — R87612 Low grade squamous intraepithelial lesion on cytologic smear of cervix (LGSIL): Secondary | ICD-10-CM

## 2018-11-01 DIAGNOSIS — Z21 Asymptomatic human immunodeficiency virus [HIV] infection status: Secondary | ICD-10-CM

## 2018-11-01 DIAGNOSIS — N9089 Other specified noninflammatory disorders of vulva and perineum: Secondary | ICD-10-CM

## 2018-11-01 NOTE — Assessment & Plan Note (Signed)
It is difficult to tell on the WebCam but given the location and appearance this looks to be consistent with a skin tag.  It is not bothering Shirley White.  I asked her to continue to monitor it for signs of irritation, bleeding, itching or any change in size so we can help facilitate work-up from there.  She is comfortable with this plan and let me know if anything further is needed.

## 2018-11-01 NOTE — Progress Notes (Signed)
Name: Shirley White  GGE:[id number]   DOB: [birth year]   PCP: Marliss Coots, NP    Virtual Visit via [email or web address] AV Note  I connected with Shirley White on 11/01/18 at 11:00 AM EDT by [email or web address] and verified that I am speaking with the correct person using two identifiers.   I discussed the limitations, risks, security and privacy concerns of performing an evaluation and management service by telephone and the availability of in person appointments. I also discussed with the patient that there may be a patient responsible charge related to this service. The patient expressed understanding and agreed to proceed.   Chief Complaint  Patient presents with  . evisit    colonoscopy scheduled 11/23/2018     History of Present Illness: Shirley White is a 48 y.o. woman with HIV disease originally diagnosed in 2005. Poor adherence with AIDS qualifying CD4s since 2016 with CD4 nadir 30. HIV Risk: heterosexual. History of OIs: esophageal candidiasis.   Previous Regimens:  Complera 2011 >> difficulty swallowing regimen  Emtriva + Edurant + Viread 2011 through 01-2014  [facility] 2016  [place] + Truvada 2018   Genotypes:  08-2014 - sensitive  11-2016 - sensitive    Shirley White continues to do very well on her Biktarvy once daily.  She still attributes this to her current relationship and the fact that her boyfriend makes her take medications.  But she does acknowledge that she feels much much better and is very proud of the personal work she has put in.  She has missed no doses of her Biktarvy.  She has noticed some weight gain, she is above 110 pounds for the first time in many years.  She has no concerns with side effects or access to her medication.  She is due for her Pap smear testing sometime between August and September.  She is expecting a phone call from the [facility] to follow-up on previous colposcopy/cervical biopsies indicating LSIL.  She has been  following with oncology/hematology for management of her severe iron deficiency anemia.  Her last hemoglobin was above 12 after intravenous iron transfusions.  She is scheduled for an endoscopy and colonoscopy in the next 3 weeks to rule out any GI related blood loss anemia. She feels like her energy is excellent.   Only concern she has today is a growth on her vagina.  This has been present for several months now.  She has not had any trouble with irritation, bleeding, itching.  It is not changed in size since she noticed it.  Denies any purulent discharge.  She at first thought it was a hair bump, but it has failed to go away.    Medical/surgical/social/family history have been updated during today's visit.    Observations/Objective: Physical Exam Constitutional:      General: She is not in acute distress.    Appearance: She is not ill-appearing.  HENT:     Mouth/Throat:     Mouth: Mucous membranes are moist.     Pharynx: Oropharynx is clear.  Pulmonary:     Effort: Pulmonary effort is normal.  Genitourinary:      Comments: Two small flesh-colored nodules visualized via videocamera. There is no discharge, erythema or induration I can appreciate.  Neurological:     Mental Status: She is alert.      HIV 1 RNA Quant (copies/mL)  Date Value  10/18/2018 <20 NOT DETECTED  06/22/2018 31 (H)  03/22/2018 <20 DETECTED (A)   CD4  T Cell Abs (/uL)  Date Value  10/18/2018 154 (L)  06/22/2018 170 (L)  03/22/2018 150 (L)    Lab Results  Component Value Date   CREATININE 0.51 10/18/2018   CREATININE 0.64 09/23/2018   CREATININE 0.66 08/06/2018    Lab Results  Component Value Date   WBC 5.0 10/18/2018   HGB 13.5 10/18/2018   HCT 40.1 10/18/2018   MCV 84.2 10/18/2018   PLT 295 10/18/2018    Lab Results  Component Value Date   ALT 11 10/18/2018   AST 11 10/18/2018   ALKPHOS 52 09/23/2018   BILITOT 0.9 10/18/2018     Assessment and Plan: Problem List Items Addressed  This Visit      Unprioritized   HIV (human immunodeficiency virus infection) ([place])    [place] is doing remarkably well on her Biktarvy.  She has been virologically suppressed for quite a while now.  CD4 count remains below 200 but as previously discussed extremely low risk in the setting of CD4 greater than 100 with undetectable viral loads for her to be at risk for any opportunistic infection.  With her ongoing anemia we elected to stop her Bactrim 3 months ago.  No signs of opportunistic infection on exam today.  She can follow-up again in 3 months.  She has already reapplied for her ADAP this interval.      LGSIL on Pap smear of cervix    Follow-up Pap smear due in August through September.  She is working with the [facility] faculty clinic but knows she can reach out to me as well to facilitate ongoing screening.      Skin tag of labia    It is difficult to tell on the WebCam but given the location and appearance this looks to be consistent with a skin tag.  It is not bothering Shirley White.  I asked her to continue to monitor it for signs of irritation, bleeding, itching or any change in size so we can help facilitate work-up from there.  She is comfortable with this plan and let me know if anything further is needed.         Follow Up Instructions: RTC in 4 months. Follow with other specialists for ongoing anemia.  Colonoscopy/endoscopy follow up as indicated.  GYN to repeat PAP smear August/September.    I discussed the assessment and treatment plan with the patient. The patient was provided an opportunity to ask questions and all were answered. The patient agreed with the plan and demonstrated an understanding of the instructions.   The patient was advised to call back or seek an in-person evaluation if the symptoms worsen or if the condition fails to improve as anticipated.  I provided 15 minutes of face-to-face time during this encounter.   Janene Madeira, MSN, NP-C  [facility] [email or web address] Pager: [phone] Office: [phone] [place]: [phone]

## 2018-11-01 NOTE — Assessment & Plan Note (Signed)
Follow-up Pap smear due in August through September.  She is working with the [facility] faculty clinic but knows she can reach out to me as well to facilitate ongoing screening.

## 2018-11-01 NOTE — Assessment & Plan Note (Signed)
Shirley White is doing remarkably well on her Biktarvy.  She has been virologically suppressed for quite a while now.  CD4 count remains below 200 but as previously discussed extremely low risk in the setting of CD4 greater than 100 with undetectable viral loads for her to be at risk for any opportunistic infection.  With her ongoing anemia we elected to stop her Bactrim 3 months ago.  No signs of opportunistic infection on exam today.  She can follow-up again in 3 months.  She has already reapplied for her ADAP this interval.

## 2018-11-02 ENCOUNTER — Encounter: Payer: Self-pay | Admitting: *Deleted

## 2018-11-02 ENCOUNTER — Ambulatory Visit (INDEPENDENT_AMBULATORY_CARE_PROVIDER_SITE_OTHER): Payer: Self-pay | Admitting: Infectious Diseases

## 2018-11-02 ENCOUNTER — Other Ambulatory Visit: Payer: Self-pay

## 2018-11-02 ENCOUNTER — Encounter: Payer: Self-pay | Admitting: Infectious Diseases

## 2018-11-02 ENCOUNTER — Ambulatory Visit (HOSPITAL_COMMUNITY)
Admission: RE | Admit: 2018-11-02 | Discharge: 2018-11-02 | Disposition: A | Discharge status: Home / Self Care | Payer: Self-pay | Source: Ambulatory Visit | Attending: Infectious Diseases | Admitting: Infectious Diseases

## 2018-11-02 DIAGNOSIS — R1012 Left upper quadrant pain: Secondary | ICD-10-CM | POA: Insufficient documentation

## 2018-11-02 LAB — NOVEL CORONAVIRUS, NAA: SARS-CoV-2, NAA: NOT DETECTED

## 2018-11-02 MED ORDER — ACETAMINOPHEN-CODEINE #3 300-30 MG PO TABS: 1.0000 | ORAL_TABLET | ORAL | 0 refills | Status: DC | PRN

## 2018-11-02 MED FILL — BIKTARVY 50-200-25 MG TABS: 50-200-25 | 30 days supply | Qty: 30 | Fill #5

## 2018-11-02 NOTE — Progress Notes (Signed)
Virtual Visit via Video Note  I connected with Shirley White on 11/02/18 at  2:00 PM EDT by a video enabled telemedicine application and verified that I am speaking with the correct person using two identifiers.   Location: Patient: Home/backyard Provider: Office   I discussed the limitations of evaluation and management by telemedicine and the availability of in person appointments. The patient expressed understanding and agreed to proceed.  History of Present Illness: Shirley White called this morning to request an urgent visit for sudden onset pain in her left flank.  She tells me that the pain started last night about 6 PM and was initially 10 out of 10.  She has been taking 4 Advil's throughout the day to help with the pain which did reduce it to a tolerable level 3-4 out of 10.  She denies any fevers or chills.  She does have some dysuria but no abdominal tenderness.  This pain is described to be intermittent and she feels like it something to do with her left kidney.  No changes to the color of her urine.  She is voiding normally as expected.  She has no trauma or injury to explain the pain otherwise.    Observations/Objective: Physical Exam Constitutional:      Comments: Appears uncomfortable.  Musculoskeletal:       Arms:     Comments: Intermittent colicky pain as pictured below.  Neurological:     Mental Status: She is alert.     Assessment and Plan:  Problem List Items Addressed This Visit      Unprioritized   Left upper quadrant pain    I am concerned with Shirley White's sudden onset pain that she may have a kidney stone.  She has never had a history of these.  Other differentials could include pyelonephritis although with lack of other infectious symptoms and abrupt onset believed to be less likely; musculoskeletal pain. She is taking too much ibuprofen, I asked her to reduce to 2-4 every 8 hours and will give her a small supply of Tylenol 3 to take as needed for severe pain.   If it is identified that she has a kidney stone will offer Flomax for treatment, push fluids, precautions to be discussed regarding ER evaluation.  She will stop by to submit a urine sample for urinalysis micro and culture today as well.       Relevant Orders   Urinalysis, Routine w reflex microscopic   Urine Culture   CT ABDOMEN PELVIS WO CONTRAST      Follow Up Instructions: As above.     I discussed the assessment and treatment plan with the patient. The patient was provided an opportunity to ask questions and all were answered. The patient agreed with the plan and demonstrated an understanding of the instructions.   The patient was advised to call back or seek an in-person evaluation if the symptoms worsen or if the condition fails to improve as anticipated.  I provided 11 minutes of non-face-to-face time during this encounter.   Janene Madeira, MSN, NP-C [facility] [email or web address] Pager: [phone] Office: [phone] [place]: [phone]

## 2018-11-02 NOTE — Addendum Note (Signed)
Addended by: Bynum Callas on: 11/02/2018 02:39 PM   Modules accepted: Orders

## 2018-11-02 NOTE — Assessment & Plan Note (Signed)
I am concerned with Shirley White's sudden onset pain that she may have a kidney stone.  She has never had a history of these.  Other differentials could include pyelonephritis although with lack of other infectious symptoms and abrupt onset believed to be less likely; musculoskeletal pain. She is taking too much ibuprofen, I asked her to reduce to 2-4 every 8 hours and will give her a small supply of Tylenol 3 to take as needed for severe pain.  If it is identified that she has a kidney stone will offer Flomax for treatment, push fluids, precautions to be discussed regarding ER evaluation.  She will stop by to submit a urine sample for urinalysis micro and culture today as well.

## 2018-11-03 ENCOUNTER — Encounter: Payer: Self-pay | Admitting: Infectious Diseases

## 2018-11-03 ENCOUNTER — Other Ambulatory Visit: Payer: Self-pay

## 2018-11-03 DIAGNOSIS — R1012 Left upper quadrant pain: Secondary | ICD-10-CM

## 2018-11-03 NOTE — Progress Notes (Signed)
No signs of any kidney stone or any problems with bowel wall thickening.  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 I advised she discuss with her GYN team about management options at her upcoming pap smear considering her severe iron deficiency anemia. She seems interested in discussing hysterectomy.

## 2018-11-05 LAB — URINALYSIS, ROUTINE W REFLEX MICROSCOPIC
Bilirubin Urine: NEGATIVE
Glucose, UA: NEGATIVE
Hgb urine dipstick: NEGATIVE
Hyaline Cast: NONE SEEN /LPF
Ketones, ur: NEGATIVE
Nitrite: POSITIVE — AB
RBC / HPF: NONE SEEN /HPF (ref 0–2)
Specific Gravity, Urine: 1.012 (ref 1.001–1.03)
pH: 7 (ref 5.0–8.0)

## 2018-11-05 LAB — URINE CULTURE
MICRO NUMBER:: 694070
SPECIMEN QUALITY:: ADEQUATE

## 2018-11-05 MED ORDER — CEPHALEXIN 500 MG PO CAPS: 500.0000 mg | ORAL_CAPSULE | Freq: Two times a day (BID) | ORAL | 0 refills | Status: DC

## 2018-11-05 NOTE — Addendum Note (Signed)
Addended by: [place] Callas on: 11/05/2018 04:01 PM   Modules accepted: Orders

## 2018-11-05 NOTE — Progress Notes (Signed)
Patient urine + for Ecoli; with flank pain will treat as early pyelonephritis. Rx for cephalexin 500 mg BID sent in to patient's pharmacy and MyChart message sent.

## 2018-11-08 ENCOUNTER — Telehealth: Payer: Self-pay | Admitting: *Deleted

## 2018-11-08 NOTE — Telephone Encounter (Signed)
The intent of this communication is to inform the Health Care Team that this patient will be discharged from [facility]).  With GOALS MET Effective 10/28/2018  patient will be discharged and removed from [facility] active patient listing.

## 2018-11-22 ENCOUNTER — Telehealth: Payer: Self-pay | Admitting: Gastroenterology

## 2018-11-22 NOTE — Telephone Encounter (Signed)

## 2018-11-23 ENCOUNTER — Other Ambulatory Visit: Payer: Self-pay

## 2018-11-23 ENCOUNTER — Ambulatory Visit (AMBULATORY_SURGERY_CENTER): Payer: Self-pay | Admitting: Gastroenterology

## 2018-11-23 ENCOUNTER — Encounter: Payer: Self-pay | Admitting: Gastroenterology

## 2018-11-23 VITALS — BP 131/83 | HR 84 | Temp 98.4°F | Resp 11 | Ht 61.0 in | Wt 108.0 lb

## 2018-11-23 DIAGNOSIS — K2970 Gastritis, unspecified, without bleeding: Secondary | ICD-10-CM

## 2018-11-23 DIAGNOSIS — D509 Iron deficiency anemia, unspecified: Secondary | ICD-10-CM

## 2018-11-23 DIAGNOSIS — K644 Residual hemorrhoidal skin tags: Secondary | ICD-10-CM

## 2018-11-23 DIAGNOSIS — B20 Human immunodeficiency virus [HIV] disease: Secondary | ICD-10-CM

## 2018-11-23 DIAGNOSIS — K2950 Unspecified chronic gastritis without bleeding: Secondary | ICD-10-CM

## 2018-11-23 DIAGNOSIS — Z21 Asymptomatic human immunodeficiency virus [HIV] infection status: Secondary | ICD-10-CM

## 2018-11-23 DIAGNOSIS — A5131 Condyloma latum: Secondary | ICD-10-CM

## 2018-11-23 MED ORDER — SODIUM CHLORIDE 0.9 % IV SOLN: 500.0000 mL | Freq: Once | INTRAVENOUS | Status: DC

## 2018-11-23 MED ORDER — FLUCONAZOLE 200 MG PO TABS: 200.0000 mg | ORAL_TABLET | Freq: Every day | ORAL | 0 refills | Status: AC

## 2018-11-23 NOTE — Progress Notes (Signed)
To PACU, VSS. Report to [email or web address] 

## 2018-11-23 NOTE — Op Note (Signed)
[place] Patient Name: Jaedynn Standley Procedure Date: 11/23/2018 9:49 AM MRN: [id number] Endoscopist: Remo Lipps P. Havery Moros , MD Age: 48 Referring MD:  Date of Birth: 01-28-1971 Gender: Female Account #: [id number] Procedure:                Upper GI endoscopy Indications:              Iron deficiency anemia, history of HIV, history of                            menorrhagia Medicines:                Monitored Anesthesia Care Procedure:                Pre-Anesthesia Assessment:                           - Prior to the procedure, a History and Physical                            was performed, and patient medications and                            allergies were reviewed. The patient's tolerance of                            previous anesthesia was also reviewed. The risks                            and benefits of the procedure and the sedation                            options and risks were discussed with the patient.                            All questions were answered, and informed consent                            was obtained. Prior Anticoagulants: The patient has                            taken no previous anticoagulant or antiplatelet                            agents. ASA Grade Assessment: III - A patient with                            severe systemic disease. After reviewing the risks                            and benefits, the patient was deemed in                            satisfactory condition to undergo the procedure.  After obtaining informed consent, the endoscope was                            passed under direct vision. Throughout the                            procedure, the patient's blood pressure, pulse, and                            oxygen saturations were monitored continuously. The                            Endoscope was introduced through the mouth, and                            advanced to the second part of  duodenum. The upper                            GI endoscopy was accomplished without difficulty.                            The patient tolerated the procedure well. Scope In: Scope Out: Findings:                 Esophagogastric landmarks were identified: the                            Z-line was found at 36 cm, the gastroesophageal                            junction was found at 36 cm and the upper extent of                            the gastric folds was found at 36 cm from the                            incisors.                           Patchy, white plaques were found in the middle                            third of the esophagus, grossly consistent with                            candidiasis                           The exam of the esophagus was otherwise normal.                           The entire examined stomach was normal. Biopsies                            were taken  with a cold forceps for Helicobacter                            pylori testing given the patient's iron deficiency.                           The duodenal bulb and second portion of the                            duodenum were normal. Complications:            No immediate complications. Estimated blood loss:                            Minimal. Estimated Blood Loss:     Estimated blood loss was minimal. Impression:               - Esophagogastric landmarks identified.                           - Esophageal plaques were found, consistent with                            candidiasis.                           - Normal esophagus otherwise.                           - Normal stomach. Biopsied for H pylori given                            history of iron deficiency.                           - Normal duodenal bulb and second portion of the                            duodenum.                           No overt cause of iron deficiency on upper                            endoscopy or colonoscopy, suspect related  to                            menorrhagia, but will await biopsy results. Recommendation:           - Patient has a contact number available for                            emergencies. The signs and symptoms of potential                            delayed complications were discussed with the  patient. Return to normal activities tomorrow.                            Written discharge instructions were provided to the                            patient.                           - Resume previous diet.                           - Continue present medications.                           - If no interactions, start fluconazole 400mg  x 1                            dose, then 200mg  / day for another 13 days                           - Await pathology results. Remo Lipps P. Viera Okonski, MD 11/23/2018 10:35:06 AM This report has been signed electronically.

## 2018-11-23 NOTE — Progress Notes (Signed)
Pt's states no medical or surgical changes since previsit or office visit. 

## 2018-11-23 NOTE — Patient Instructions (Signed)
Pick up prescription. You are to take Fluconazole 400mg  on day 1, then 200mg  daily for 13 days. Resume previous diet.   YOU HAD AN ENDOSCOPIC PROCEDURE TODAY AT [facility]:   Refer to the procedure report that was given to you for any specific questions about what was found during the examination.  If the procedure report does not answer your questions, please call your gastroenterologist to clarify.  If you requested that your care partner not be given the details of your procedure findings, then the procedure report has been included in a sealed envelope for you to review at your convenience later.  YOU SHOULD EXPECT: Some feelings of bloating in the abdomen. Passage of more gas than usual.  Walking can help get rid of the air that was put into your GI tract during the procedure and reduce the bloating. If you had a lower endoscopy (such as a colonoscopy or flexible sigmoidoscopy) you may notice spotting of blood in your stool or on the toilet paper. If you underwent a bowel prep for your procedure, you may not have a normal bowel movement for a few days.  Please Note:  You might notice some irritation and congestion in your nose or some drainage.  This is from the oxygen used during your procedure.  There is no need for concern and it should clear up in a day or so.  SYMPTOMS TO REPORT IMMEDIATELY:   Following lower endoscopy (colonoscopy or flexible sigmoidoscopy):  Excessive amounts of blood in the stool  Significant tenderness or worsening of abdominal pains  Swelling of the abdomen that is new, acute  Fever of 100F or higher   Following upper endoscopy (EGD)  Vomiting of blood or coffee ground material  New chest pain or pain under the shoulder blades  Painful or persistently difficult swallowing  New shortness of breath  Fever of 100F or higher  Black, tarry-looking stools  For urgent or emergent issues, a gastroenterologist can be reached at any hour by calling  [phone].   DIET:  We do recommend a small meal at first, but then you may proceed to your regular diet.  Drink plenty of fluids but you should avoid alcoholic beverages for 24 hours.  ACTIVITY:  You should plan to take it easy for the rest of today and you should NOT DRIVE or use heavy machinery until tomorrow (because of the sedation medicines used during the test).    FOLLOW UP: Our staff will call the number listed on your records 48-72 hours following your procedure to check on you and address any questions or concerns that you may have regarding the information given to you following your procedure. If we do not reach you, we will leave a message.  We will attempt to reach you two times.  During this call, we will ask if you have developed any symptoms of COVID 19. If you develop any symptoms (ie: fever, flu-like symptoms, shortness of breath, cough etc.) before then, please call [phone].  If you test positive for Covid 19 in the 2 weeks post procedure, please call and report this information to [place].    If any biopsies were taken you will be contacted by phone or by letter within the next 1-3 weeks.  Please call us at [phone] if you have not heard about the biopsies in 3 weeks.    SIGNATURES/CONFIDENTIALITY: You and/or your care partner have signed paperwork which will be entered into your electronic medical  record.  These signatures attest to the fact that that the information above on your After Visit Summary has been reviewed and is understood.  Full responsibility of the confidentiality of this discharge information lies with you and/or your care-partner. 

## 2018-11-23 NOTE — Progress Notes (Signed)
Called to room to assist during endoscopic procedure.  Patient ID and intended procedure confirmed with present staff. Received instructions for my participation in the procedure from the performing physician.  

## 2018-11-23 NOTE — Op Note (Signed)
[place] Patient Name: Shirley White Procedure Date: 11/23/2018 9:48 AM MRN: [id number] Endoscopist: Remo Lipps P. Havery Moros , MD Age: 48 Referring MD:  Date of Birth: 12/29/1970 Gender: Female Account #: [id number] Procedure:                Colonoscopy Indications:              Iron deficiency anemia, history of HIV, no prior                            colonoscopy, patient endorses menorrhagia Medicines:                Monitored Anesthesia Care Procedure:                Pre-Anesthesia Assessment:                           - Prior to the procedure, a History and Physical                            was performed, and patient medications and                            allergies were reviewed. The patient's tolerance of                            previous anesthesia was also reviewed. The risks                            and benefits of the procedure and the sedation                            options and risks were discussed with the patient.                            All questions were answered, and informed consent                            was obtained. Prior Anticoagulants: The patient has                            taken no previous anticoagulant or antiplatelet                            agents. ASA Grade Assessment: III - A patient with                            severe systemic disease. After reviewing the risks                            and benefits, the patient was deemed in                            satisfactory condition to undergo the procedure.  After obtaining informed consent, the colonoscope                            was passed under direct vision. Throughout the                            procedure, the patient's blood pressure, pulse, and                            oxygen saturations were monitored continuously. The                            Colonoscope was introduced through the anus and                            advanced  to the the terminal ileum, with                            identification of the appendiceal orifice and IC                            valve. The colonoscopy was performed without                            difficulty. The patient tolerated the procedure                            well. The quality of the bowel preparation was                            adequate. The terminal ileum, ileocecal valve,                            appendiceal orifice, and rectum were photographed. Scope In: 10:01:42 AM Scope Out: 10:25:13 AM Scope Withdrawal Time: 0 hours 17 minutes 58 seconds  Total Procedure Duration: 0 hours 23 minutes 31 seconds  Findings:                 Skin tags were found on perianal exam.                           The terminal ileum appeared normal.                           A polypoid lesion was found in the distal rectum /                            anal canal. 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 Biopsies were taken with                            a cold forceps for histology.  The exam was otherwise without abnormality. Complications:            No immediate complications. Estimated blood loss:                            Minimal. Estimated Blood Loss:     Estimated blood loss was minimal. Impression:               - Perianal skin tags found on perianal exam.                           - The examined portion of the ileum was normal.                           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 Biopsied.                           - The examination was otherwise normal.                           No cause for iron deficiency on colonoscopy, which                            may be due to menorrhagia. Recommendation:           - Patient has a contact number available for                            emergencies. The signs and symptoms of potential                             delayed complications were discussed with the                            patient. Return to normal activities tomorrow.                            Written discharge instructions were provided to the                            patient.                           - Resume previous diet.                           - Continue present medications.                           - Await pathology results. Remo Lipps P. Manaia Samad, MD 11/23/2018 10:31:03 AM This report has been signed electronically.

## 2018-11-24 ENCOUNTER — Encounter: Payer: Self-pay | Admitting: Infectious Diseases

## 2018-11-25 ENCOUNTER — Telehealth: Payer: Self-pay

## 2018-11-25 ENCOUNTER — Other Ambulatory Visit: Payer: Self-pay | Admitting: Infectious Diseases

## 2018-11-25 DIAGNOSIS — B20 Human immunodeficiency virus [HIV] disease: Secondary | ICD-10-CM

## 2018-11-25 NOTE — Telephone Encounter (Signed)
  Follow up Call-  Call back number 11/23/2018  Post procedure Call Back phone  # [phone]  Permission to leave phone message Yes  Some recent data might be hidden     Patient questions:  Do you have a fever, pain , or abdominal swelling? No. Pain Score  0 *  Have you tolerated food without any problems? Yes.    Have you been able to return to your normal activities? Yes.    Do you have any questions about your discharge instructions: Diet   No. Medications  No. Follow up visit  No.  Do you have questions or concerns about your Care? No.  Actions: * If pain score is 4 or above: No action needed, pain <4.  1. Have you developed a fever since your procedure? no  2.   Have you had an respiratory symptoms (SOB or cough) since your procedure? no  3.   Have you tested positive for COVID 19 since your procedure no  4.   Have you had any family members/close contacts diagnosed with the COVID 19 since your procedure?  no   If yes to any of these questions please route to Joylene John, RN and Alphonsa Gin, Therapist, sports.

## 2018-11-30 MED FILL — BIKTARVY 50-200-25 MG TABS: 50-200-25 | 30 days supply | Qty: 30 | Fill #0

## 2018-12-06 ENCOUNTER — Telehealth: Payer: Self-pay | Admitting: Family Medicine

## 2018-12-06 NOTE — Telephone Encounter (Signed)
Called the patient in regards to being placed on the wait list. The patient could not attend the original appointment offer. Informed I would mail an appointment letter with the agreed upon time she could attend. The patient verbalized understanding.

## 2018-12-07 NOTE — Telephone Encounter (Signed)
For the month of September Dr.Dove only have morning appointments

## 2018-12-08 ENCOUNTER — Telehealth: Payer: Self-pay

## 2018-12-08 NOTE — Telephone Encounter (Signed)
Called pt to advise that Dr Hulan Fray only has AM appointment for September, and her appointment time can not be changed. Pt verbalized understanding.

## 2018-12-29 ENCOUNTER — Telehealth: Payer: Self-pay | Admitting: Obstetrics & Gynecology

## 2018-12-29 ENCOUNTER — Other Ambulatory Visit: Payer: Self-pay | Admitting: *Deleted

## 2018-12-29 DIAGNOSIS — Z8742 Personal history of other diseases of the female genital tract: Secondary | ICD-10-CM

## 2018-12-29 NOTE — Telephone Encounter (Signed)
Called the patient to inform of the upcoming appointment. Left a detailed voicemail inform the patient if she has been diagnosed with covid, in close contact with someone who has had covid, or experienced any flu-like symptoms in the past 14 days please give our office a call to reschedule. Also notified due to [place] restriction no children or visitors are allowed.

## 2018-12-29 NOTE — Progress Notes (Signed)
Cbc for dental appointment 9/22. Landis Gandy, RN

## 2018-12-30 ENCOUNTER — Ambulatory Visit: Payer: Self-pay | Admitting: Obstetrics & Gynecology

## 2018-12-30 ENCOUNTER — Other Ambulatory Visit: Payer: Self-pay

## 2018-12-30 DIAGNOSIS — Z8742 Personal history of other diseases of the female genital tract: Secondary | ICD-10-CM

## 2018-12-30 LAB — CBC
HCT: 33.8 % — ABNORMAL LOW (ref 35.0–45.0)
Hemoglobin: 10.8 g/dL — ABNORMAL LOW (ref 11.7–15.5)
MCH: 25.4 pg — ABNORMAL LOW (ref 27.0–33.0)
MCHC: 32 g/dL (ref 32.0–36.0)
MCV: 79.5 fL — ABNORMAL LOW (ref 80.0–100.0)
MPV: 11.1 fL (ref 7.5–12.5)
Platelets: 416 10*3/uL — ABNORMAL HIGH (ref 140–400)
RBC: 4.25 10*6/uL (ref 3.80–5.10)
RDW: 14.7 % (ref 11.0–15.0)
WBC: 5.7 10*3/uL (ref 3.8–10.8)

## 2019-01-17 ENCOUNTER — Encounter

## 2019-01-24 ENCOUNTER — Other Ambulatory Visit: Payer: Self-pay

## 2019-01-24 ENCOUNTER — Other Ambulatory Visit: Payer: Self-pay | Admitting: Infectious Diseases

## 2019-01-24 ENCOUNTER — Telehealth: Payer: Self-pay | Admitting: Pharmacy Technician

## 2019-01-24 DIAGNOSIS — B20 Human immunodeficiency virus [HIV] disease: Secondary | ICD-10-CM

## 2019-01-24 MED ORDER — BIKTARVY 50-200-25 MG PO TABS: ORAL_TABLET | ORAL | 3 refills | Status: DC

## 2019-01-24 NOTE — Telephone Encounter (Signed)
[facility] Patient Advocate Encounter  Patient previously filled her medications at [facility] and will need to switch over to [facility] due to [employer]. Shirley White and the patient have been updated on the new process for getting her medication.

## 2019-02-14 ENCOUNTER — Encounter: Payer: Self-pay | Admitting: Family

## 2019-02-14 ENCOUNTER — Ambulatory Visit (INDEPENDENT_AMBULATORY_CARE_PROVIDER_SITE_OTHER): Payer: Self-pay | Admitting: Family

## 2019-02-14 ENCOUNTER — Other Ambulatory Visit: Payer: Self-pay

## 2019-02-14 DIAGNOSIS — K5900 Constipation, unspecified: Secondary | ICD-10-CM | POA: Insufficient documentation

## 2019-02-14 NOTE — Patient Instructions (Addendum)
Nice to meet you.  Would recommend a laxative to help you have a bowel movement.   It is re-assuring your previous CT scan was normal.   For the magnesium citrate 1/2 to 1 bottle followed by 8 ounces of water.  You may also consider Miralax or Ducolox.  If your symptoms do not improve please let us know.   Have a great day and stay safe!   Constipation, Adult Constipation is when a person:  Poops (has a bowel movement) fewer times in a week than normal.  Has a hard time pooping.  Has poop that is dry, hard, or bigger than normal. Follow these instructions at home: Eating and drinking   Eat foods that have a lot of fiber, such as: ? Fresh fruits and vegetables. ? Whole grains. ? Beans.  Eat less of foods that are high in fat, low in fiber, or overly processed, such as: ? [place] fries. ? Hamburgers. ? Cookies. ? Candy. ? Soda.  Drink enough fluid to keep your pee (urine) clear or pale yellow. General instructions  Exercise regularly or as told by your doctor.  Go to the restroom when you feel like you need to poop. Do not hold it in.  Take over-the-counter and prescription medicines only as told by your doctor. These include any fiber supplements.  Do pelvic floor retraining exercises, such as: ? Doing deep breathing while relaxing your lower belly (abdomen). ? Relaxing your pelvic floor while pooping.  Watch your condition for any changes.  Keep all follow-up visits as told by your doctor. This is important. Contact a doctor if:  You have pain that gets worse.  You have a fever.  You have not pooped for 4 days.  You throw up (vomit).  You are not hungry.  You lose weight.  You are bleeding from the anus.  You have thin, pencil-like poop (stool). Get help right away if:  You have a fever, and your symptoms suddenly get worse.  You leak poop or have blood in your poop.  Your belly feels hard or bigger than normal (is bloated).  You have  very bad belly pain.  You feel dizzy or you faint. This information is not intended to replace advice given to you by your health care provider. Make sure you discuss any questions you have with your health care provider. Document Released: 09/17/2007 Document Revised: 03/13/2017 Document Reviewed: 09/19/2015 Elsevier Patient Education  2020 [employer].

## 2019-02-14 NOTE — Progress Notes (Signed)
Subjective:    Patient ID: Shirley White, female    DOB: 1970/06/09, 48 y.o.   MRN: [id number]  Chief Complaint  Patient presents with  . Pain    abdominal cramping pain since [place]. Denies nausea, vomiting, and diarrhea. reports having a bowel movement every 2-3 days. Tried Pepto Bismuth without relief.      HPI:  Shirley White is a 48 y.o. female with HIV/AIDS who was last seen in the office on 11/02/18 with left upper quadrant/flank pain with CT scan showing no evidence of urinary tract calculus or hydronephrosis presents today for an acute office visit having sent a message through the [facility] system about having acute onset abdominal pains.  Shirley White has been have abdominal pain located in the middle of her abdomen that has been going on for about 3 days with a severity that is "intense". Pain is constant and has been refractory to Pepto Bismol which eased it a little. Her normal bowel pattern is generally about every 2-3 days and has been about 1 weeks since her last bowel movement. No nausea, vomiting, fevers, chills or sweets. Last period was about 2 weeks ago. Eating and drinking without change. No trauma or injury and denies pain elsewhere.    No Known Allergies    Outpatient Medications Prior to Visit  Medication Sig Dispense Refill  . acetaminophen-codeine (TYLENOL #3) 300-30 MG tablet Take 1 tablet by mouth every 4 (four) hours as needed for moderate pain. 30 tablet 0  . bictegravir-emtricitabine-tenofovir AF (BIKTARVY) 50-200-25 MG TABS tablet TAKE 1 TABLET BY MOUTH DAILY, TO TAKE AT THE SAME TIME EACH DAY WITH OR WITHOUT FOOD 30 tablet 3  . ferrous sulfate 325 (65 FE) MG EC tablet Take 325 mg by mouth 2 (two) times daily with a meal.    . cephALEXin (KEFLEX) 500 MG capsule Take 1 capsule (500 mg total) by mouth 2 (two) times daily. (Patient not taking: Reported on 02/14/2019) 14 capsule 0   No facility-administered medications prior to visit.      Past  Medical History:  Diagnosis Date  . Anemia   . GERD (gastroesophageal reflux disease)    no meds  . Heart murmur   . HIV disease [facility])      Past Surgical History:  Procedure Laterality Date  . CESAREAN SECTION    . ESOPHAGOGASTRODUODENOSCOPY N/A 02/03/2016   Procedure: ESOPHAGOGASTRODUODENOSCOPY (EGD);  Surgeon: Manus Gunning, MD;  Location: [place];  Service: Gastroenterology;  Laterality: N/A;  . RADIOACTIVE SEED GUIDED EXCISIONAL BREAST BIOPSY Right 06/23/2018   Procedure: RADIOACTIVE SEED GUIDED EXCISIONAL RIGHT BREAST BIOPSY;  Surgeon: Stark Klein, MD;  Location: [place];  Service: General;  Laterality: Right;       Review of Systems  Constitutional: Negative for chills, diaphoresis, fatigue and fever.  Respiratory: Negative for cough, chest tightness, shortness of breath and wheezing.   Cardiovascular: Negative for chest pain.  Gastrointestinal: Negative for abdominal pain, diarrhea, nausea and vomiting.      Objective:    BP 136/82   Pulse 97   Temp 99.1 F (37.3 C)   Wt 111 lb (50.3 kg)   BMI 20.97 kg/m  Nursing note and vital signs reviewed.  Physical Exam Constitutional:      General: She is not in acute distress.    Appearance: She is well-developed.  Cardiovascular:     Rate and Rhythm: Normal rate and regular rhythm.     Heart sounds: Normal heart  sounds.  Pulmonary:     Effort: Pulmonary effort is normal.     Breath sounds: Normal breath sounds.  Abdominal:     General: Bowel sounds are normal. There is distension.     Palpations: Abdomen is soft. There is no mass.     Tenderness: There is abdominal tenderness in the epigastric area, periumbilical area and left upper quadrant. There is no right CVA tenderness, left CVA tenderness, guarding or rebound. Negative signs include Murphy's sign, McBurney's sign and psoas sign.  Skin:    General: Skin is warm and dry.  Neurological:     Mental Status: She is alert and  oriented to person, place, and time.  Psychiatric:        Behavior: Behavior normal.        Thought Content: Thought content normal.        Judgment: Judgment normal.      Depression screen [facility] 2/9 11/01/2018 07/14/2018 05/24/2018 04/29/2018 04/29/2018  Decreased Interest 0 0 2 0 1  Down, Depressed, Hopeless 0 0 2 0 2  PHQ - 2 Score 0 0 4 0 3  Altered sleeping - - 0 3 0  Tired, decreased energy - - 2 3 2   Change in appetite - - 1 0 2  Feeling bad or failure about yourself  - - 0 0 2  Trouble concentrating - - 0 0 2  Moving slowly or fidgety/restless - - 0 0 2  Suicidal thoughts - - 0 0 0  PHQ-9 Score - - 7 6 13   Difficult doing work/chores - - - - -       Assessment & Plan:    Patient Active Problem List   Diagnosis Date Noted  . Constipation 02/14/2019  . Left upper quadrant pain 11/02/2018  . Skin tag of labia 11/01/2018  . HIV (human immunodeficiency virus infection) ([place]) 07/14/2018  . 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 Trichomoniasis 04/29/2018  . Vaginal discharge 03/22/2018  . Anorexia 01/05/2018  . Thrombocytopenia ([place]) 12/12/2017  . Routine health maintenance 08/28/2016  . AIDS ([place]) 02/02/2016  . H/O menorrhagia 09/05/2014  . Iron deficiency anemia 09/05/2014  . Sickle-cell trait ([place]) 06/04/2006     Problem List Items Addressed This Visit      Other   Constipation    [name] has generalized abdominal pain that is consistent with constipation given her pattern of bowel movements. Abdominal exam is without evidence of any acute findings and previous CT scan was normal. Recommend over the counter medication including Miralax, Ducolox, or magnesium citrate for relief. General information and prevention provided in AVS. Follow up if symptoms worsen or do not improve.           I have discontinued Shirley White "Shirley White"'s cephALEXin. I am also having her maintain her ferrous sulfate, acetaminophen-codeine, and Biktarvy.   Follow-up:  Return if symptoms worsen or fail to improve.   Terri Piedra, MSN, FNP-C Nurse Practitioner [facility] for Infectious Disease [place] number: [phone]

## 2019-02-14 NOTE — Assessment & Plan Note (Signed)
Shirley White has generalized abdominal pain that is consistent with constipation given her pattern of bowel movements. Abdominal exam is without evidence of any acute findings and previous CT scan was normal. Recommend over the counter medication including Miralax, Ducolox, or magnesium citrate for relief. General information and prevention provided in AVS. Follow up if symptoms worsen or do not improve.

## 2019-02-23 ENCOUNTER — Ambulatory Visit: Payer: Self-pay | Admitting: Infectious Diseases

## 2019-02-23 ENCOUNTER — Telehealth: Payer: Self-pay

## 2019-02-23 NOTE — Telephone Encounter (Signed)
COVID-19 Pre-Screening Questions:  Do you currently have a fever (>100 F), chills or unexplained body aches? no   Are you currently experiencing new cough, shortness of breath, sore throat, runny nose? No  Have you recently travelled outside the state of [place] in the last 14 days? no .  Have you been in contact with someone that is currently pending confirmation of Covid19 testing or has been confirmed to have the [place] virus? no  **If the patient answers NO to ALL questions -  advise the patient to please call the clinic before coming to the office should any symptoms develop.

## 2019-02-24 ENCOUNTER — Other Ambulatory Visit: Payer: Self-pay

## 2019-02-24 ENCOUNTER — Ambulatory Visit (INDEPENDENT_AMBULATORY_CARE_PROVIDER_SITE_OTHER): Payer: Self-pay | Admitting: Infectious Diseases

## 2019-02-24 ENCOUNTER — Encounter: Payer: Self-pay | Admitting: Infectious Diseases

## 2019-02-24 VITALS — BP 131/73 | HR 89 | Wt 110.0 lb

## 2019-02-24 DIAGNOSIS — B20 Human immunodeficiency virus [HIV] disease: Secondary | ICD-10-CM

## 2019-02-24 DIAGNOSIS — Z23 Encounter for immunization: Secondary | ICD-10-CM

## 2019-02-24 DIAGNOSIS — Z21 Asymptomatic human immunodeficiency virus [HIV] infection status: Secondary | ICD-10-CM

## 2019-02-24 DIAGNOSIS — R87612 Low grade squamous intraepithelial lesion on cytologic smear of cervix (LGSIL): Secondary | ICD-10-CM

## 2019-02-24 DIAGNOSIS — Z0000 Encounter for general adult medical examination without abnormal findings: Secondary | ICD-10-CM

## 2019-02-24 DIAGNOSIS — D500 Iron deficiency anemia secondary to blood loss (chronic): Secondary | ICD-10-CM

## 2019-02-24 NOTE — Assessment & Plan Note (Signed)
Continue iron supplementation as instructed by hematology team. Repeat cbc today.

## 2019-02-24 NOTE — Assessment & Plan Note (Signed)
Shirley White has been doing exceptionally well for the last year now on her Biktarvy and undetectable since September 2019. We stopped her bactrim since her CD4 is consistently > 100 and she has been virologically suppressed. She has refills of biktarvy and will continue without any changes.  Update viral load and cd4 today.  She will apply for [employer] as part of PCAP program to help broaden her access to specialty services.  Otherwise she can follow up in 4 months for HIV care.

## 2019-02-24 NOTE — Patient Instructions (Signed)
So nice to see you as always!  It sounds like you are doing wonderful.   Today we will update your lab work and inform you via your MyChart.    Don't forget to sign up for your insurance before December 15th! Don't miss this because we will need gynecology's help regarding what we talked about today.   Please stop by the lab today before you leave.   Please come back for office visit with Colletta Maryland in 4 months - we can do labs at the visit again or if you like you can do labs and we can do a video visit if that is more convenient for you.   Please schedule an appointment before the end of the year for your pap smear.

## 2019-02-24 NOTE — Assessment & Plan Note (Signed)
She will return in 1 month for a pap smear update to follow previous abnormal results.

## 2019-02-24 NOTE — Progress Notes (Signed)
Name: Shirley White  DOB: October 26, 1970 MRN: [id number] PCP: Marliss Coots, NP    Patient Active Problem List   Diagnosis Date Noted  . HIV (human immunodeficiency virus infection) ([place]) 07/14/2018    Priority: High  . AIDS ([place]) 02/02/2016    Priority: High  . Skin tag of labia 11/01/2018  . 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 Thrombocytopenia ([place]) 12/12/2017  . Routine health maintenance 08/28/2016  . H/O menorrhagia 09/05/2014  . Iron deficiency anemia 09/05/2014  . Sickle-cell trait ([place]) 06/04/2006     Brief Narrative:  Shirley White is a 48 y.o. woman with HIV disease originally diagnosed in 2005. Poor adherence with AIDS qualifying CD4s since 2016 with CD4 nadir 30. HIV Risk: heterosexual. History of OIs: esophageal candidiasis.   Previous Regimens: . Complera 2011 >> difficulty swallowing regimen . Emtriva + Edurant + Viread 2011 through 01-2014 . Odefsey 2016 . Tiviay + Truvada 2018   Genotypes: . [id number] - sensitive . [id number] - sensitive  .   Subjective:  Shirley White is here today for follow up on HIV, iron deficiency anemia and pap smear.   HPI/ROS:  She is feeling very well today. Has been getting in her Biktarvy every single day without any missed doses. She has had no concerns over any side effects to her medication. Continues to work full time at [employer]. Still with same boyfriend, Darnelle Maffucci. She would like to discuss with gynecology about reversing her tubal ligation to consider having a baby with him. She still gets her periods regularly and has a history of uterine fibroids. She tried to follow up with GYN for her pap smear last year, however something happened with her insurance - she is not sure why but was unable to be seen.     She has not yet had her flu shot. She continues to work with hematology and takes her iron as instructed. She has never had a mammogram. Gained 2 more pounds which she is happy about.   Review of Systems   Constitutional: Negative for chills, fever, malaise/fatigue and weight loss.  HENT: Negative for sore throat.   Respiratory: Negative for cough, sputum production and shortness of breath.   Cardiovascular: Negative.   Gastrointestinal: Negative for abdominal pain, diarrhea and vomiting.  Musculoskeletal: Negative for joint pain, myalgias and neck pain.  Skin: Negative for rash.  Neurological: Negative for headaches.  Psychiatric/Behavioral: Negative for depression and substance abuse. The patient is not nervous/anxious.     Past Medical History:  Diagnosis Date  . Anemia   . GERD (gastroesophageal reflux disease)    no meds  . Heart murmur   . HIV disease [facility])     Outpatient Medications Prior to Visit  Medication Sig Dispense Refill  . acetaminophen-codeine (TYLENOL #3) 300-30 MG tablet Take 1 tablet by mouth every 4 (four) hours as needed for moderate pain. 30 tablet 0  . bictegravir-emtricitabine-tenofovir AF (BIKTARVY) 50-200-25 MG TABS tablet TAKE 1 TABLET BY MOUTH DAILY, TO TAKE AT THE SAME TIME EACH DAY WITH OR WITHOUT FOOD 30 tablet 3  . ferrous sulfate 325 (65 FE) MG EC tablet Take 325 mg by mouth 2 (two) times daily with a meal.     No facility-administered medications prior to visit.      No Known Allergies  Social History   Tobacco Use  . Smoking status: Never Smoker  . Smokeless tobacco: Never Used  Substance Use Topics  . Alcohol use:  Yes    Alcohol/week: 1.0 standard drinks    Types: 1 Cans of beer per week    Comment: occa  . Drug use: Not Currently    Types: Marijuana    Social History   Substance and Sexual Activity  Sexual Activity Not Currently  . Partners: Male  . Birth control/protection: Condom   Comment: given condoms     Objective:   Vitals:   02/24/19 1438  BP: 131/73  Pulse: 89  Weight: 110 lb (49.9 kg)   Body mass index is 20.78 kg/m.   Physical Exam Constitutional:      Appearance: She is well-developed.     Comments:  Seated comfortably in chair.   HENT:     Mouth/Throat:     Mouth: No oral lesions.     Dentition: Normal dentition. No dental abscesses.     Pharynx: No oropharyngeal exudate.  Cardiovascular:     Rate and Rhythm: Normal rate and regular rhythm.     Heart sounds: Normal heart sounds.  Pulmonary:     Effort: Pulmonary effort is normal.     Breath sounds: Normal breath sounds.  Abdominal:     General: There is no distension.     Palpations: Abdomen is soft.     Tenderness: There is no abdominal tenderness.  Lymphadenopathy:     Cervical: No cervical adenopathy.  Skin:    General: Skin is warm and dry.     Findings: No rash.  Neurological:     Mental Status: She is alert and oriented to person, place, and time.  Psychiatric:        Judgment: Judgment normal.     Comments: In good spirits today and engaged in care discussion     Lab Results Lab Results  Component Value Date   WBC 5.7 12/30/2018   HGB 10.8 (L) 12/30/2018   HCT 33.8 (L) 12/30/2018   MCV 79.5 (L) 12/30/2018   PLT 416 (H) 12/30/2018    Lab Results  Component Value Date   CREATININE 0.51 10/18/2018   BUN 11 10/18/2018   NA 137 10/18/2018   K 4.5 10/18/2018   CL 105 10/18/2018   CO2 26 10/18/2018    Lab Results  Component Value Date   ALT 11 10/18/2018   AST 11 10/18/2018   ALKPHOS 52 09/23/2018   BILITOT 0.9 10/18/2018    Lab Results  Component Value Date   CHOL 143 08/21/2015   HDL 61 08/21/2015   LDLCALC 70 08/21/2015   TRIG 62 08/21/2015   CHOLHDL 2.3 08/21/2015   HIV 1 RNA Quant (copies/mL)  Date Value  10/18/2018 <20 NOT DETECTED  06/22/2018 31 (H)  03/22/2018 <20 DETECTED (A)   CD4 T Cell Abs (/uL)  Date Value  10/18/2018 154 (L)  06/22/2018 170 (L)  03/22/2018 150 (L)     Assessment & Plan:   Problem List Items Addressed This Visit      High   AIDS ([place]) (Chronic)    Annaelizabeth has been doing exceptionally well for the last year now on her [place] and undetectable since  September 2019. We stopped her bactrim since her CD4 is consistently > 100 and she has been virologically suppressed. She has refills of biktarvy and will continue without any changes.  Update viral load and cd4 today.  She will apply for [employer] as part of PCAP program to help broaden her access to specialty services.  Otherwise she can follow up in 4 months  for HIV care.       HIV (human immunodeficiency virus infection) ([place]) - Primary   Relevant Orders   HIV 1 RNA quant-no reflex-bld   T-helper cell (CD4)- ([facility] clinic only)   CBC     Unprioritized   Iron deficiency anemia    Continue iron supplementation as instructed by hematology team. Repeat cbc today.       Routine health maintenance    Flu shot today.       LGSIL on Pap smear of cervix    She will return in 1 month for a pap smear update to follow previous abnormal results.          Janene Madeira, MSN, NP-C [facility] for Infectious [place] Pager: [phone] Office: [phone]  02/24/19  4:14 PM

## 2019-02-24 NOTE — Assessment & Plan Note (Signed)
Flu shot today 

## 2019-02-25 ENCOUNTER — Telehealth: Payer: Self-pay | Admitting: Hematology and Oncology

## 2019-02-25 LAB — T-HELPER CELL (CD4) - (RCID CLINIC ONLY)
CD4 % Helper T Cell: 15 % — ABNORMAL LOW (ref 33–65)
CD4 T Cell Abs: 309 /uL — ABNORMAL LOW (ref 400–1790)

## 2019-02-25 NOTE — Telephone Encounter (Signed)
[facility] transfer to [place]. Left message re calling office set up appointment.

## 2019-02-28 ENCOUNTER — Telehealth: Payer: Self-pay

## 2019-02-28 NOTE — Telephone Encounter (Signed)
Thank you Tammy - I agree with Urgent Care vs ER referral.   I wonder if she is on her menstrual cycle - she has a pretty large uterine fibroid that may be contributing to her abdominal pain. I have been trying to get her in to see gynecology but for some reason they did not accept her per her account.

## 2019-02-28 NOTE — Telephone Encounter (Signed)
Patient could not be specific about abdominal pain. She states her whole stomach aches and has been aching for some time and worsened yesterday and today.  She was moaning due to pain.   She was advised to go to local urgent care for evaluation or emergency room if she felt is was more emergent.   Laverle Patter ,RN

## 2019-03-01 ENCOUNTER — Ambulatory Visit (HOSPITAL_COMMUNITY)
Admission: EM | Admit: 2019-03-01 | Discharge: 2019-03-01 | Disposition: A | Discharge status: Home / Self Care | Payer: Self-pay | Attending: Family Medicine | Admitting: Family Medicine

## 2019-03-01 ENCOUNTER — Encounter (HOSPITAL_COMMUNITY): Payer: Self-pay

## 2019-03-01 DIAGNOSIS — K5900 Constipation, unspecified: Secondary | ICD-10-CM | POA: Insufficient documentation

## 2019-03-01 DIAGNOSIS — N3000 Acute cystitis without hematuria: Secondary | ICD-10-CM

## 2019-03-01 DIAGNOSIS — Z3202 Encounter for pregnancy test, result negative: Secondary | ICD-10-CM

## 2019-03-01 LAB — POCT URINALYSIS DIP (DEVICE)
Bilirubin Urine: NEGATIVE
Glucose, UA: NEGATIVE mg/dL
Hgb urine dipstick: NEGATIVE
Ketones, ur: NEGATIVE mg/dL
Nitrite: POSITIVE — AB
Protein, ur: 100 mg/dL — AB
Specific Gravity, Urine: 1.02 (ref 1.005–1.030)
Urobilinogen, UA: 1 mg/dL (ref 0.0–1.0)
pH: 6.5 (ref 5.0–8.0)

## 2019-03-01 LAB — POCT PREGNANCY, URINE: Preg Test, Ur: NEGATIVE

## 2019-03-01 MED ORDER — NITROFURANTOIN MONOHYD MACRO 100 MG PO CAPS: 100.0000 mg | ORAL_CAPSULE | Freq: Two times a day (BID) | ORAL | 0 refills | Status: DC

## 2019-03-01 NOTE — ED Provider Notes (Signed)
[place]    CSN: [id number] Arrival date & time: 03/01/19  0805      History   Chief Complaint Chief Complaint  Patient presents with  . Abdominal Pain    HPI Shirley White is a 48 y.o. female.   Patient is a 48 year old female with past medical history of HIV, anemia, GERD.  She presents today with approximately 1 week or more of generalized abdominal discomfort. This has been intermittent. Has not been drinking water.  Describes as cramping and achy. Dark urine.  Was seen on 02/14/2019 by her PCP and diagnosed with constipation. Took mirilax 2 x. No BM since last Thursday. No N,V,D. No blood in the stool.  No vaginal discharge, itching or irritation.  No dysuria or hematuria.  Not concerned for STDs. Patient's last menstrual period was 02/21/2019.  ROS per HPI        Past Medical History:  Diagnosis Date  . Anemia   . GERD (gastroesophageal reflux disease)    no meds  . Heart murmur   . HIV disease [facility])     Patient Active Problem List   Diagnosis Date Noted  . Skin tag of labia 11/01/2018  . HIV (human immunodeficiency virus infection) ([place]) 07/14/2018  . 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 Thrombocytopenia ([place]) 12/12/2017  . Routine health maintenance 08/28/2016  . AIDS ([place]) 02/02/2016  . H/O menorrhagia 09/05/2014  . Iron deficiency anemia 09/05/2014  . Sickle-cell trait ([place]) 06/04/2006    Past Surgical History:  Procedure Laterality Date  . CESAREAN SECTION    . ESOPHAGOGASTRODUODENOSCOPY N/A 02/03/2016   Procedure: ESOPHAGOGASTRODUODENOSCOPY (EGD);  Surgeon: Manus Gunning, MD;  Location: [place];  Service: Gastroenterology;  Laterality: N/A;  . RADIOACTIVE SEED GUIDED EXCISIONAL BREAST BIOPSY Right 06/23/2018   Procedure: RADIOACTIVE SEED GUIDED EXCISIONAL RIGHT BREAST BIOPSY;  Surgeon: Stark Klein, MD;  Location: [place];  Service: General;  Laterality: Right;    OB History    Gravida  5    Para  4   Term  4   Preterm      AB  1   Living  4     SAB  0   TAB  1   Ectopic      Multiple      Live Births  4            Home Medications    Prior to Admission medications   Medication Sig Start Date End Date Taking? Authorizing Provider  acetaminophen-codeine (TYLENOL #3) 300-30 MG tablet Take 1 tablet by mouth every 4 (four) hours as needed for moderate pain. 11/02/18   [place] Callas, NP  bictegravir-emtricitabine-tenofovir AF (BIKTARVY) 50-200-25 MG TABS tablet TAKE 1 TABLET BY MOUTH DAILY, TO TAKE AT THE SAME TIME EACH DAY WITH OR WITHOUT FOOD 01/24/19   [place] Callas, NP  ferrous sulfate 325 (65 FE) MG EC tablet Take 325 mg by mouth 2 (two) times daily with a meal.    [provider]  nitrofurantoin, macrocrystal-monohydrate, (MACROBID) 100 MG capsule Take 1 capsule (100 mg total) by mouth 2 (two) times daily. 03/01/19   Orvan July, NP    Family History Family History  Problem Relation Age of Onset  . Hyperlipidemia Mother   . Hypertension Mother   . Throat cancer Father   . Leukemia Neg Hx   . Lymphoma Neg Hx   . Colon cancer Neg Hx   . Esophageal  cancer Neg Hx   . Stomach cancer Neg Hx   . Rectal cancer Neg Hx     Social History Social History   Tobacco Use  . Smoking status: Never Smoker  . Smokeless tobacco: Never Used  Substance Use Topics  . Alcohol use: Yes    Alcohol/week: 1.0 standard drinks    Types: 1 Cans of beer per week    Comment: occa  . Drug use: Not Currently    Types: Marijuana     Allergies   Patient has no known allergies.   Review of Systems Review of Systems   Physical Exam Triage Vital Signs ED Triage Vitals  Enc Vitals Group     BP 03/01/19 0821 (!) 160/90     Pulse Rate 03/01/19 0821 84     Resp 03/01/19 0821 16     Temp 03/01/19 0821 98.3 F (36.8 C)     Temp Source 03/01/19 0821 Oral     SpO2 03/01/19 0821 100 %     Weight --      Height --      Head Circumference --       Peak Flow --      Pain Score 03/01/19 0820 10     Pain Loc --      Pain Edu? --      Excl. in [place]? --    No data found.  Updated Vital Signs BP (!) 160/90 (BP Location: Left Arm)   Pulse 84   Temp 98.3 F (36.8 C) (Oral)   Resp 16   LMP 02/21/2019   SpO2 100%   Visual Acuity Right Eye Distance:   Left Eye Distance:   Bilateral Distance:    Right Eye Near:   Left Eye Near:    Bilateral Near:     Physical Exam Vitals signs and nursing note reviewed.  Constitutional:      General: She is not in acute distress.    Appearance: Normal appearance. She is not ill-appearing, toxic-appearing or diaphoretic.  HENT:     Head: Normocephalic.     Nose: Nose normal.     Mouth/Throat:     Pharynx: Oropharynx is clear.  Eyes:     Conjunctiva/sclera: Conjunctivae normal.  Neck:     Musculoskeletal: Normal range of motion.  Pulmonary:     Effort: Pulmonary effort is normal.  Abdominal:     General: Bowel sounds are normal.     Palpations: Abdomen is soft.     Tenderness: There is generalized abdominal tenderness.  Musculoskeletal: Normal range of motion.  Skin:    General: Skin is warm and dry.     Findings: No rash.  Neurological:     Mental Status: She is alert.  Psychiatric:        Mood and Affect: Mood normal.      UC Treatments / Results  Labs (all labs ordered are listed, but only abnormal results are displayed) Labs Reviewed  POCT URINALYSIS DIP (DEVICE) - Abnormal; Notable for the following components:      Result Value   Protein, ur 100 (*)    Nitrite POSITIVE (*)    Leukocytes,Ua MODERATE (*)    All other components within normal limits  URINE CULTURE  POC URINE PREG, ED  POCT PREGNANCY, URINE    EKG   Radiology No results found.  Procedures Procedures (including critical care time)  Medications Ordered in UC Medications - No data to display  Initial Impression / Assessment  and Plan / UC Course  I have reviewed the triage vital signs and  the nursing notes.  Pertinent labs & imaging results that were available during my care of the patient were reviewed by me and considered in my medical decision making (see chart for details).     Cystitis-treating with Macrobid.  Will send urine for culture.  Constipation-recommended MiraLAX daily  Final Clinical Impressions(s) / UC Diagnoses   Final diagnoses:  Acute cystitis without hematuria  Constipation, unspecified constipation type     Discharge Instructions     Treating you for constipation. Take the miralax daily.  Also treating you for a urinary tract infection  Make sure you increase your water intake.  Follow up as needed for continued or worsening symptoms        ED Prescriptions    Medication Sig Dispense Auth. Provider   nitrofurantoin, macrocrystal-monohydrate, (MACROBID) 100 MG capsule Take 1 capsule (100 mg total) by mouth 2 (two) times daily. 10 capsule Loura Halt A, NP     PDMP not reviewed this encounter.   Orvan July, NP 03/01/19 [phone]

## 2019-03-01 NOTE — ED Triage Notes (Signed)
Pt presents to the UC with abdominal pain x 2 weeks. Pt described the pain as she is having contractions. Pt states she has relief is she is on her knee in a praying position.

## 2019-03-01 NOTE — Discharge Instructions (Addendum)
Treating you for constipation. Take the miralax daily.  Also treating you for a urinary tract infection  Make sure you increase your water intake.  Follow up as needed for continued or worsening symptoms

## 2019-03-03 LAB — URINE CULTURE: Culture: >=100000 — AB

## 2019-03-03 LAB — CBC
HCT: 26.1 % — ABNORMAL LOW (ref 35.0–45.0)
Hemoglobin: 7.9 g/dL — ABNORMAL LOW (ref 11.7–15.5)
MCH: 21.7 pg — ABNORMAL LOW (ref 27.0–33.0)
MCHC: 30.3 g/dL — ABNORMAL LOW (ref 32.0–36.0)
MCV: 71.7 fL — ABNORMAL LOW (ref 80.0–100.0)
MPV: 11 fL (ref 7.5–12.5)
Platelets: 499 10*3/uL — ABNORMAL HIGH (ref 140–400)
RBC: 3.64 10*6/uL — ABNORMAL LOW (ref 3.80–5.10)
RDW: 16.4 % — ABNORMAL HIGH (ref 11.0–15.0)
WBC: 5.4 10*3/uL (ref 3.8–10.8)

## 2019-03-03 LAB — HIV-1 RNA QUANT-NO REFLEX-BLD
HIV 1 RNA Quant: <20 copies/mL
HIV-1 RNA Quant, Log: <1.3 Log copies/mL

## 2019-03-04 ENCOUNTER — Telehealth: Payer: Self-pay | Admitting: Emergency Medicine

## 2019-03-04 NOTE — Telephone Encounter (Signed)
Urine culture was positive for ESCHERICHIA COLI resistant to macrobid given at urgent care visit. Per Traci, if pt still symptomatic, to send cipro 500 BID x 5 days. Attempted to reach patient. No answer at this time. Voicemail left.

## 2019-03-07 ENCOUNTER — Telehealth: Payer: Self-pay | Admitting: Emergency Medicine

## 2019-03-07 NOTE — Telephone Encounter (Signed)
Attempted to reach patient x2. No answer at this time. Voicemail left.    

## 2019-03-08 ENCOUNTER — Telehealth (HOSPITAL_COMMUNITY): Payer: Self-pay | Admitting: Emergency Medicine

## 2019-03-08 NOTE — Telephone Encounter (Signed)
Attempted to reach patient x3. No answer at this time. Voicemail left. Pt read my mychart message yesterday. Closing out chart.

## 2019-03-08 NOTE — Telephone Encounter (Signed)
Pt returned my call, states she feels 100% better. No change in treatment indicated.

## 2019-03-18 ENCOUNTER — Telehealth: Payer: Self-pay

## 2019-03-18 NOTE — Telephone Encounter (Signed)
COVID-19 Pre-Screening Questions:03/18/19   Do you currently have a fever (>100 F), chills or unexplained body aches? NO   Are you currently experiencing new cough, shortness of breath, sore throat, runny nose? NO  .  Have you recently travelled outside the state of [place] in the last 14 days? NO .  Have you been in contact with someone that is currently pending confirmation of Covid19 testing or has been confirmed to have the [place] virus?  NO  **If the patient answers NO to ALL questions -  advise the patient to please call the clinic before coming to the office should any symptoms develop.

## 2019-03-21 ENCOUNTER — Ambulatory Visit: Payer: Self-pay | Admitting: Infectious Diseases

## 2019-04-15 DIAGNOSIS — C181 Malignant neoplasm of appendix: Secondary | ICD-10-CM

## 2019-04-15 HISTORY — DX: Malignant neoplasm of appendix: C18.1

## 2019-06-01 ENCOUNTER — Ambulatory Visit (INDEPENDENT_AMBULATORY_CARE_PROVIDER_SITE_OTHER)
Admission: RE | Admit: 2019-06-01 | Discharge: 2019-06-01 | Disposition: A | Discharge status: Home / Self Care | Payer: Self-pay | Source: Ambulatory Visit

## 2019-06-01 DIAGNOSIS — R55 Syncope and collapse: Secondary | ICD-10-CM

## 2019-06-01 DIAGNOSIS — R110 Nausea: Secondary | ICD-10-CM

## 2019-06-01 DIAGNOSIS — R05 Cough: Secondary | ICD-10-CM

## 2019-06-01 DIAGNOSIS — R059 Cough, unspecified: Secondary | ICD-10-CM

## 2019-06-01 DIAGNOSIS — R52 Pain, unspecified: Secondary | ICD-10-CM

## 2019-06-01 NOTE — ED Provider Notes (Signed)
Virtual Visit via Video Note:  Shirley White  initiated request for Telemedicine visit with [facility] Urgent [facility] team. I connected with Debora E Rice  on 06/02/2019 at 8:18 AM  for a synchronized telemedicine visit using a video enabled HIPPA compliant telemedicine application. I verified that I am speaking with Shirley White  using two identifiers. Orvan July, NP  was physically located in a [facility] Urgent care site and Valor Hueber Benham was located at a different location.   The limitations of evaluation and management by telemedicine as well as the availability of in-person appointments were discussed. Patient was informed that she  may incur a bill ( including co-pay) for this virtual visit encounter. Shirley White  expressed understanding and gave verbal consent to proceed with virtual visit.     History of Present Illness:Shirley White  is a 49 y.o. female presents with fainting spells. Reports that she passed out this am. Denies hitting head. She reports that she landed on the floor. No injuries.  Her boyfriend helped her up. She was getting ready for work when this happened.  She has been seeing floaters in vision at times but denies any trouble with vision. . No headache or dizziness. No fever. She has been coughing and had some nausea. This has been for 3 days. No diarrhea or constipation. No sick contacts. She has been drinking normally but not eating as much. She has been taking her medications that she is prescribed.  No chest pain, SOB.   Past Medical History:  Diagnosis Date  . Anemia   . GERD (gastroesophageal reflux disease)    no meds  . Heart murmur   . HIV disease ([place])     No Known Allergies      Observations/Objective:VITALS: Per patient if applicable, see vitals. GENERAL: Alert, appears well and in no acute distress. HEENT: Atraumatic, conjunctiva clear, no obvious abnormalities on inspection of external nose and ears. NECK: Normal  movements of the head and neck. CARDIOPULMONARY: No increased WOB. Speaking in clear sentences. I:E ratio WNL.  MS: Moves all visible extremities without noticeable abnormality. PSYCH: Pleasant and cooperative, well-groomed. Speech normal rate and rhythm. Affect is appropriate. Insight and judgement are appropriate. Attention is focused, linear, and appropriate.  NEURO: CN grossly intact. Oriented as arrived to appointment on time with no prompting. Moves both UE equally.  SKIN: No obvious lesions, wounds, erythema, or cyanosis noted on face or hands.     Assessment and Plan: pt appears well on camera, speaking in full sentences and no slurred speech. Alert She is having some viral symptoms, to include cough, nausea and some fatigue.she may be slightly dehydrated.  I recommended come in person to be seen but she wants to rest for a few days and take off work.  We can proceed with this plan with strict precautions that if she passes out again or starts developing any other concerning symptoms you need to go to the ER.  Patient understanding and agree.  Work note sent to my chart.   Follow Up Instructions: Go to the ER for any continued or worsening problems    I discussed the assessment and treatment plan with the patient. The patient was provided an opportunity to ask questions and all were answered. The patient agreed with the plan and demonstrated an understanding of the instructions.   The patient was advised to call back or seek an in-person evaluation if the symptoms worsen or if  the condition fails to improve as anticipated.    Orvan July, NP  06/02/2019 8:18 AM         Orvan July, NP 06/02/19 [id number]

## 2019-06-01 NOTE — Discharge Instructions (Addendum)
This is most likely something viral You can take Tylenol and ibuprofen for your body aches Patient drinking plenty of fluids and staying hydrated. If your symptoms worsen or do not improve you need to be seen in person

## 2019-06-16 ENCOUNTER — Encounter: Payer: Self-pay | Admitting: Infectious Diseases

## 2019-06-22 ENCOUNTER — Telehealth: Payer: Self-pay

## 2019-06-22 NOTE — Telephone Encounter (Signed)
COVID-19 Pre-Screening Questions:06/22/19  Do you currently have a fever (>100 F), chills or unexplained body aches?NO   Are you currently experiencing new cough, shortness of breath, sore throat, runny nose? NO  .  Have you recently travelled outside the state of [place] in the last 14 days?NO .  Have you been in contact with someone that is currently pending confirmation of Covid19 testing or has been confirmed to have the [place] virus?  NO   **If the patient answers NO to ALL questions -  advise the patient to please call the clinic before coming to the office should any symptoms develop.

## 2019-06-23 ENCOUNTER — Other Ambulatory Visit: Payer: Self-pay

## 2019-06-23 ENCOUNTER — Ambulatory Visit (INDEPENDENT_AMBULATORY_CARE_PROVIDER_SITE_OTHER): Payer: Self-pay | Admitting: Infectious Diseases

## 2019-06-23 ENCOUNTER — Encounter: Payer: Self-pay | Admitting: Infectious Diseases

## 2019-06-23 VITALS — BP 148/98 | HR 103 | Temp 98.3°F | Ht 61.0 in | Wt 106.0 lb

## 2019-06-23 DIAGNOSIS — R55 Syncope and collapse: Secondary | ICD-10-CM | POA: Insufficient documentation

## 2019-06-23 DIAGNOSIS — R87612 Low grade squamous intraepithelial lesion on cytologic smear of cervix (LGSIL): Secondary | ICD-10-CM

## 2019-06-23 DIAGNOSIS — B20 Human immunodeficiency virus [HIV] disease: Secondary | ICD-10-CM

## 2019-06-23 DIAGNOSIS — R110 Nausea: Secondary | ICD-10-CM | POA: Insufficient documentation

## 2019-06-23 DIAGNOSIS — Z8742 Personal history of other diseases of the female genital tract: Secondary | ICD-10-CM

## 2019-06-23 DIAGNOSIS — D500 Iron deficiency anemia secondary to blood loss (chronic): Secondary | ICD-10-CM

## 2019-06-23 DIAGNOSIS — Z21 Asymptomatic human immunodeficiency virus [HIV] infection status: Secondary | ICD-10-CM

## 2019-06-23 MED ORDER — ONDANSETRON 4 MG PO TBDP: 4.0000 mg | ORAL_TABLET | Freq: Three times a day (TID) | ORAL | 1 refills | Status: DC | PRN

## 2019-06-23 MED ORDER — BIKTARVY 50-200-25 MG PO TABS: ORAL_TABLET | ORAL | 11 refills | Status: DC

## 2019-06-23 NOTE — Assessment & Plan Note (Signed)
Continues to do very well on her Biktarvy. We reviewed her labs and again congratulated her on her efforts. Her CD4 count has reconstituted to > 300 cells. She has been off bactrim given serial undetectable viral loads per our previous discussions.  She has other issues that take precedent today - see below.  Return in about 3 months (around 09/23/2019).

## 2019-06-23 NOTE — Assessment & Plan Note (Signed)
Unclear what is causing this for her. Currently on heavy menstrual cycle and verified not pregnant with home pregnancy test. ?orthostatic hypotension vs GERD vs alternative.  Will prescribe zofran ODT for now while we await blood work.

## 2019-06-23 NOTE — Patient Instructions (Addendum)
Nice to see you!  If you can make an appointment when you cycle stops next week I can do your pap smear next week. Alternatively you can have this done with the gynecology team when you go to that appointment.   March 30th - please schedule a lab appointment to check your hormones.   I will place a referral to have you seen by Gynecology to discuss your bleeding and possibility of hysterectomy.   Please stop by the lab today for a blood draw - I suspect your hemoglobin and iron are low - we may need to arrange a blood transfusion and have you follow up with hematology.   If you continue having passing out episodes we should get you in to see a cardiologist (heart doctor) to consider checking an ultrasound of your heart to make sure all is well.   Please come back in 3 months to check in.     Menopause Menopause is the normal time of life when menstrual periods stop completely. It is usually confirmed by 12 months without a menstrual period. The transition to menopause (perimenopause) most often happens between the ages of 5 and 56. During perimenopause, hormone levels change in your body, which can cause symptoms and affect your health. Menopause may increase your risk for:  Loss of bone (osteoporosis), which causes bone breaks (fractures).  Depression.  Hardening and narrowing of the arteries (atherosclerosis), which can cause heart attacks and strokes. What are the causes? This condition is usually caused by a natural change in hormone levels that happens as you get older. The condition may also be caused by surgery to remove both ovaries (bilateral oophorectomy). What increases the risk? This condition is more likely to start at an earlier age if you have certain medical conditions or treatments, including:  A tumor of the pituitary gland in the brain.  A disease that affects the ovaries and hormone production.  Radiation treatment for cancer.  Certain cancer treatments, such  as chemotherapy or hormone (anti-estrogen) therapy.  Heavy smoking and excessive alcohol use.  Family history of early menopause. This condition is also more likely to develop earlier in women who are very thin. What are the signs or symptoms? Symptoms of this condition include:  Hot flashes.  Irregular menstrual periods.  Night sweats.  Changes in feelings about sex. This could be a decrease in sex drive or an increased comfort around your sexuality.  Vaginal dryness and thinning of the vaginal walls. This may cause painful intercourse.  Dryness of the skin and development of wrinkles.  Headaches.  Problems sleeping (insomnia).  Mood swings or irritability.  Memory problems.   Weight gain.  Hair growth on the face and chest.  Bladder infections or problems with urinating. How is this diagnosed? This condition is diagnosed based on your medical history, a physical exam, your age, your menstrual history, and your symptoms. Hormone tests may also be done. How is this treated? In some cases, no treatment is needed. You and your health care provider should make a decision together about whether treatment is necessary. Treatment will be based on your individual condition and preferences. Treatment for this condition focuses on managing symptoms. Treatment may include:  Menopausal hormone therapy (MHT).  Medicines to treat specific symptoms or complications.  Acupuncture.  Vitamin or herbal supplements. Before starting treatment, make sure to let your health care provider know if you have a personal or family history of:  Heart disease.  Breast cancer.  Blood  clots.  Diabetes.  Osteoporosis. Follow these instructions at home: Lifestyle  Do not use any products that contain nicotine or tobacco, such as cigarettes and e-cigarettes. If you need help quitting, ask your health care provider.  Get at least 30 minutes of physical activity on 5 or more days each  week.  Avoid alcoholic and caffeinated beverages, as well as spicy foods. This may help prevent hot flashes.  Get 7-8 hours of sleep each night.  If you have hot flashes, try: ? Dressing in layers. ? Avoiding things that may trigger hot flashes, such as spicy food, warm places, or stress. ? Taking slow, deep breaths when a hot flash starts. ? Keeping a fan in your home and office.  Find ways to manage stress, such as deep breathing, meditation, or journaling.  Consider going to group therapy with other women who are having menopause symptoms. Ask your health care provider about recommended group therapy meetings. Eating and drinking  Eat a healthy, balanced diet that contains whole grains, lean protein, low-fat dairy, and plenty of fruits and vegetables.  Your health care provider may recommend adding more soy to your diet. Foods that contain soy include tofu, tempeh, and soy milk.  Eat plenty of foods that contain calcium and vitamin D for bone health. Items that are rich in calcium include low-fat milk, yogurt, beans, almonds, sardines, broccoli, and kale. Medicines  Take over-the-counter and prescription medicines only as told by your health care provider.  Talk with your health care provider before starting any herbal supplements. If prescribed, take vitamins and supplements as told by your health care provider. These may include: ? Calcium. Women age 75 and older should get 1,200 mg (milligrams) of calcium every day. ? Vitamin D. Women need 600-800 International Units of vitamin D each day. ? Vitamins B12 and B6. Aim for 50 micrograms of B12 and 1.5 mg of B6 each day. General instructions  Keep track of your menstrual periods, including: ? When they occur. ? How heavy they are and how long they last. ? How much time passes between periods.  Keep track of your symptoms, noting when they start, how often you have them, and how long they last.  Use vaginal lubricants or  moisturizers to help with vaginal dryness and improve comfort during sex.  Keep all follow-up visits as told by your health care provider. This is important. This includes any group therapy or counseling. Contact a health care provider if:  You are still having menstrual periods after age 87.  You have pain during sex.  You have not had a period for 12 months and you develop vaginal bleeding. Get help right away if:  You have: ? Severe depression. ? Excessive vaginal bleeding. ? Pain when you urinate. ? A fast or irregular heart beat (palpitations). ? Severe headaches. ? Abdomen (abdominal) pain or severe indigestion.  You fell and you think you have a broken bone.  You develop leg or chest pain.  You develop vision problems.  You feel a lump in your breast. Summary  Menopause is the normal time of life when menstrual periods stop completely. It is usually confirmed by 12 months without a menstrual period.  The transition to menopause (perimenopause) most often happens between the ages of 59 and 21.  Symptoms can be managed through medicines, lifestyle changes, and complementary therapies such as acupuncture.  Eat a balanced diet that is rich in nutrients to promote bone health and heart health and to manage  symptoms during menopause. This information is not intended to replace advice given to you by your health care provider. Make sure you discuss any questions you have with your health care provider. Document Revised: 03/13/2017 Document Reviewed: 05/03/2016 Elsevier Patient Education  2020 [employer].

## 2019-06-23 NOTE — Progress Notes (Signed)
Name: Shirley White  DOB: 1970/12/21 MRN: [id number] PCP: Marliss Coots, NP    Patient Active Problem List   Diagnosis Date Noted  . HIV (human immunodeficiency virus infection) ([place]) 07/14/2018    Priority: High  . Nausea 06/23/2019  . Syncope, vasovagal 06/23/2019  . Skin tag of labia 11/01/2018  . 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 Thrombocytopenia ([place]) 12/12/2017  . Routine health maintenance 08/28/2016  . H/O menorrhagia 09/05/2014  . Iron deficiency anemia 09/05/2014  . Sickle-cell trait ([place]) 06/04/2006     Brief Narrative:  Shirley White is a 49 y.o. woman with HIV disease originally diagnosed in 2005. Poor adherence with AIDS qualifying CD4s since 2016 with CD4 nadir 30. HIV Risk: heterosexual. History of OIs: esophageal candidiasis.   Previous Regimens: . Complera 2011 >> difficulty swallowing regimen . Emtriva + Edurant + Viread 2011 through 01-2014 . Odefsey 2016 . Tiviay + Truvada 2018  . Biktarvy 2019  Genotypes: . [id number] - sensitive . [id number] - sensitive  .   Subjective:  CC: HIV follow up care.  Nausea in mornings with retching and syncopal episodes  Heavy periods - currently on menses as of 2 days ago.    HPI: Daily early morning nausea for the last few weeks that has been challenging for her. Associated with retching but no actual vomiting. She has had episodes where she passes out / faints that she states happen after these episodes.  She has never fallen or injured herself. Nothing seems to help her symptoms. Has missed some work due to these new problems and requesting work note.   Her sleep is poor - wakes up frequently during the night. No sweating or hot flashes. Has noticed some weight gain beyond what she was trying to achieve. Her mother went through menopause around this age from what she recalls and thinks she stopped menstruating by 49. History of bilateral tubal ligation years ago. Feels some lower abdominal bloating  and someone asked her at work when she was due recently. Does not feel as if this is worse than before.    Has not been to see Hematology in a while. Continues on Iron PO once a day separated carefully from [place]. Has not missed any doses of her Biktarvy and no trouble with side effects or access to her medication.    Review of Systems  Constitutional: Positive for fatigue and unexpected weight change. Negative for appetite change, chills, diaphoresis and fever.  HENT: Negative for dental problem, sinus pain and sore throat.   Respiratory: Negative for cough, chest tightness, shortness of breath and wheezing.   Cardiovascular: Positive for palpitations (occasionally when she passes out ). Negative for chest pain and leg swelling.  Gastrointestinal: Positive for abdominal distention and nausea. Negative for abdominal pain, blood in stool, constipation, diarrhea and vomiting.  Endocrine: Negative for cold intolerance, heat intolerance, polydipsia and polyuria.  Genitourinary: Positive for menstrual problem (heavy cycles that are irregular. ) and vaginal bleeding. Negative for difficulty urinating, dyspareunia, pelvic pain and vaginal discharge.  Musculoskeletal: Negative for arthralgias, joint swelling and myalgias.  Skin: Negative for color change and rash.  Neurological: Positive for dizziness, syncope and light-headedness.  Hematological: Negative for adenopathy.     Past Medical History:  Diagnosis Date  . Anemia   . GERD (gastroesophageal reflux disease)    no meds  . Heart murmur   . HIV disease [facility])     Outpatient Medications Prior to Visit  Medication Sig Dispense Refill  . acetaminophen-codeine (TYLENOL #3) 300-30 MG tablet Take 1 tablet by mouth every 4 (four) hours as needed for moderate pain. 30 tablet 0  . ferrous sulfate 325 (65 FE) MG EC tablet Take 325 mg by mouth 2 (two) times daily with a meal.    . bictegravir-emtricitabine-tenofovir AF (BIKTARVY) 50-200-25 MG  TABS tablet TAKE 1 TABLET BY MOUTH DAILY, TO TAKE AT THE SAME TIME EACH DAY WITH OR WITHOUT FOOD 30 tablet 3  . nitrofurantoin, macrocrystal-monohydrate, (MACROBID) 100 MG capsule Take 1 capsule (100 mg total) by mouth 2 (two) times daily. 10 capsule 0   No facility-administered medications prior to visit.     No Known Allergies  Social History   Tobacco Use  . Smoking status: Never Smoker  . Smokeless tobacco: Never Used  Substance Use Topics  . Alcohol use: Yes    Alcohol/week: 1.0 standard drinks    Types: 1 Cans of beer per week    Comment: occa  . Drug use: Not Currently    Types: Marijuana    Social History   Substance and Sexual Activity  Sexual Activity Not Currently  . Partners: Male  . Birth control/protection: Condom   Comment: given condoms     Objective:   Vitals:   06/23/19 1444  BP: (!) 148/98  Pulse: (!) 103  Temp: 98.3 F (36.8 C)  SpO2: 98%  Weight: 106 lb (48.1 kg)  Height: 5\' 1"  (1.549 m)   Body mass index is 20.03 kg/m.   Physical Exam Constitutional:      Appearance: She is well-developed.     Comments: Seated comfortably in chair. Appears fatigued today  HENT:     Mouth/Throat:     Mouth: No oral lesions.     Dentition: Normal dentition. No dental abscesses.     Pharynx: No oropharyngeal exudate.  Cardiovascular:     Rate and Rhythm: Normal rate and regular rhythm.     Heart sounds: Murmur (diastolic murmur ) present. Gallop present. S3 sounds present.   Pulmonary:     Effort: Pulmonary effort is normal.     Breath sounds: Normal breath sounds.  Abdominal:     General: There is distension.     Palpations: Abdomen is soft.     Tenderness: There is no abdominal tenderness.  Lymphadenopathy:     Cervical: No cervical adenopathy.  Skin:    General: Skin is warm and dry.     Capillary Refill: Capillary refill takes less than 2 seconds.     Findings: No rash.  Neurological:     Mental Status: She is alert and oriented to  person, place, and time.  Psychiatric:        Judgment: Judgment normal.     Comments: In good spirits today and engaged in care discussion     Lab Results EKG reviewed personally - NSR without any conduction abnormalities. Normal rate and rhythm.    Lab Results  Component Value Date   WBC 5.4 02/24/2019   HGB 7.9 (L) 02/24/2019   HCT 26.1 (L) 02/24/2019   MCV 71.7 (L) 02/24/2019   PLT 499 (H) 02/24/2019    Lab Results  Component Value Date   CREATININE 0.51 10/18/2018   BUN 11 10/18/2018   NA 137 10/18/2018   K 4.5 10/18/2018   CL 105 10/18/2018   CO2 26 10/18/2018    Lab Results  Component Value Date   ALT 11 10/18/2018   AST 11  10/18/2018   ALKPHOS 52 09/23/2018   BILITOT 0.9 10/18/2018    Lab Results  Component Value Date   CHOL 143 08/21/2015   HDL 61 08/21/2015   LDLCALC 70 08/21/2015   TRIG 62 08/21/2015   CHOLHDL 2.3 08/21/2015   HIV 1 RNA Quant (copies/mL)  Date Value  02/24/2019 <20 NOT DETECTED  10/18/2018 <20 NOT DETECTED  06/22/2018 31 (H)   CD4 T Cell Abs (/uL)  Date Value  02/24/2019 309 (L)  10/18/2018 154 (L)  06/22/2018 170 (L)     Assessment & Plan:   Problem List Items Addressed This Visit      High   HIV (human immunodeficiency virus infection) ([place])    Continues to do very well on her Biktarvy. We reviewed her labs and again congratulated her on her efforts. Her CD4 count has reconstituted to > 300 cells. She has been off bactrim given serial undetectable viral loads per our previous discussions.  She has other issues that take precedent today - see below.  Return in about 3 months (around 09/23/2019).       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 Her retching episodes are associated with syncope and likely vasovagal induced. Alt may be sign of symptomatic murmur vs anemia - EKG today reveals NSR with no conduction abnormalities. She does have a heart murmur so if nothing to explain on blood work will proceed with cardiology referral to consider further work up.       Nausea    Unclear what is causing this for her. Currently on heavy menstrual cycle and verified not pregnant with home pregnancy test. ?orthostatic hypotension vs GERD vs alternative.  Will prescribe zofran ODT for now while we await blood work.        LGSIL on Pap smear of cervix    Repeat pap smear once she is off cycle - will plan next week.       Iron deficiency anemia    Last hgb was 7.9 in November. I am concerned this may be lower now. Check CBC, Iron Panel today. She has heart murmur with gallop on exam today that may all be related.  She will need a follow up with Hematology for blood vs iron transfusion. Becomes mildly tachycardic with minimal effort.       Relevant Orders   CBC   Iron, TIBC and Ferritin Panel   H/O menorrhagia - Primary    Given large fibroid and severe iron deficiency anemia likely needs discussion with GYN re: hysterectomy vs IUD for conservative management.  I suspect she is peri-menopausal. Will have her back for FSH/LH in 3 weeks given she is on cycle day 2 today.       Relevant Orders   Ambulatory referral to Obstetrics / Gynecology      Janene Madeira, MSN, NP-C [place] [facility] [place] Pager: [phone] Office: [phone]  06/23/19  10:08 PM

## 2019-06-23 NOTE — Assessment & Plan Note (Signed)
Known and previously benign/asmptomatic heart murmur likely due to IDA. Her retching episodes are associated with syncope and likely vasovagal induced. Alt may be sign of symptomatic murmur vs anemia - EKG today reveals NSR with no conduction abnormalities. She does have a heart murmur so if nothing to explain on blood work will proceed with cardiology referral to consider further work up.

## 2019-06-23 NOTE — Assessment & Plan Note (Signed)
Given large fibroid and severe iron deficiency anemia likely needs discussion with GYN re: hysterectomy vs IUD for conservative management.  I suspect she is peri-menopausal. Will have her back for FSH/LH in 3 weeks given she is on cycle day 2 today.

## 2019-06-23 NOTE — Assessment & Plan Note (Signed)
Last hgb was 7.9 in November. I am concerned this may be lower now. Check CBC, Iron Panel today. She has heart murmur with gallop on exam today that may all be related.  She will need a follow up with Hematology for blood vs iron transfusion. Becomes mildly tachycardic with minimal effort.

## 2019-06-23 NOTE — Assessment & Plan Note (Signed)
Repeat pap smear once she is off cycle - will plan next week.

## 2019-06-24 ENCOUNTER — Telehealth: Payer: Self-pay

## 2019-06-24 LAB — T-HELPER CELL (CD4) - (RCID CLINIC ONLY)
CD4 % Helper T Cell: 19 % — ABNORMAL LOW (ref 33–65)
CD4 T Cell Abs: 347 /uL — ABNORMAL LOW (ref 400–1790)

## 2019-06-24 NOTE — Progress Notes (Signed)
Please call Shirley White to let her know that her blood and iron counts are very low. I think she will need either blood transfusion or iron transfusion - I suspect this is why she has been passing out lately.  Please have her call her Hematology team to schedule an appointment as soon as possible so they can determine which would be best for her.  Phone: [phone]

## 2019-06-24 NOTE — Telephone Encounter (Signed)
-----   Message from Winslow Callas, NP sent at 06/24/2019 10:51 AM EST ----- Please call Shirley White to let her know that her blood and iron counts are very low. I think she will need either blood transfusion or iron transfusion - I suspect this is why she has been passing out lately.  Please have her call her Hematology team to schedule an appointment as soon as possible so they can determine which would be best for her.  Phone: [phone]

## 2019-06-24 NOTE — Telephone Encounter (Signed)
Spoke with patient regarding lab results. Patient understands she must call Hematology for an appointment. Provided patient with number. Will call them today. [employer]

## 2019-06-28 LAB — CBC
HCT: 24.5 % — ABNORMAL LOW (ref 35.0–45.0)
Hemoglobin: 7 g/dL — ABNORMAL LOW (ref 11.7–15.5)
MCH: 17.9 pg — ABNORMAL LOW (ref 27.0–33.0)
MCHC: 28.6 g/dL — ABNORMAL LOW (ref 32.0–36.0)
MCV: 62.7 fL — ABNORMAL LOW (ref 80.0–100.0)
MPV: 10.1 fL (ref 7.5–12.5)
Platelets: 547 10*3/uL — ABNORMAL HIGH (ref 140–400)
RBC: 3.91 10*6/uL (ref 3.80–5.10)
RDW: 19.3 % — ABNORMAL HIGH (ref 11.0–15.0)
WBC: 6.8 10*3/uL (ref 3.8–10.8)

## 2019-06-28 LAB — IRON,TIBC AND FERRITIN PANEL
%SAT: 3 % (calc) — ABNORMAL LOW (ref 16–45)
Ferritin: <1 ng/mL — ABNORMAL LOW (ref 16–232)
Iron: 11 ug/dL — ABNORMAL LOW (ref 40–190)
TIBC: 424 mcg/dL (calc) (ref 250–450)

## 2019-06-28 LAB — HIV-1 RNA QUANT-NO REFLEX-BLD
HIV 1 RNA Quant: <20 copies/mL
HIV-1 RNA Quant, Log: <1.3 Log copies/mL

## 2019-07-08 ENCOUNTER — Telehealth: Payer: Self-pay

## 2019-07-08 ENCOUNTER — Encounter: Payer: Self-pay | Admitting: Infectious Diseases

## 2019-07-08 NOTE — Telephone Encounter (Signed)
Called patient back with Stephanie's message. Patient did not have any questions, and will call office back Monday with update.  Is requesting doctors note for today. Informed patient since we did not see her in office we would not be able to write a letter for her. Will forward message to NP. [place]

## 2019-07-08 NOTE — Telephone Encounter (Signed)
It sounds more like she is having some effects from the vaccine.  If she is not having pain in the lower abdomen or pain/discomfort with passing her urine, I would encourage her to drink plenty of fluids (soda, tea and coffee don't count) to flush her out.   Would treat the body aches with tylenol as needed for now or ibuprofen.   If on Monday she does not feel any better or if she develops any chills/fever we can see if she can come supply a urine for culture and urinalysis and consider an antibiotic then.

## 2019-07-08 NOTE — Telephone Encounter (Signed)
I put a note in for her to access on MyChart to excuse her from work today. If she is having side effect from vaccine I can understand how she feels and want to ensure she has no barriers to getting the second dose later.   Thank you

## 2019-07-08 NOTE — Telephone Encounter (Signed)
Received call today from patient stating she feels like she has a UTI. States for 3-4 days she has had dark urine, body aches, and nausea. Denies any medicine changes, but states she had covid vaccine this past Sunday. Would like message forwarded to NP to advise what she should do, Aundria Rud, [place]

## 2019-07-12 ENCOUNTER — Other Ambulatory Visit: Payer: Self-pay | Admitting: *Deleted

## 2019-07-12 ENCOUNTER — Other Ambulatory Visit: Payer: Self-pay

## 2019-07-12 DIAGNOSIS — N951 Menopausal and female climacteric states: Secondary | ICD-10-CM

## 2019-07-12 DIAGNOSIS — N926 Irregular menstruation, unspecified: Secondary | ICD-10-CM

## 2019-07-12 LAB — FOLLICLE STIMULATING HORMONE: FSH: 4.8 m[IU]/mL

## 2019-07-12 LAB — PROGESTERONE: Progesterone: 12.4 ng/mL

## 2019-07-12 LAB — LUTEINIZING HORMONE: LH: 8.5 m[IU]/mL

## 2019-07-13 ENCOUNTER — Inpatient Hospital Stay (HOSPITAL_COMMUNITY)
Admission: EM | Admit: 2019-07-13 | Discharge: 2019-07-20 | DRG: 330 | Disposition: A | Discharge status: Home / Self Care | Payer: Self-pay | Attending: Surgery | Admitting: Surgery

## 2019-07-13 ENCOUNTER — Encounter (HOSPITAL_COMMUNITY): Payer: Self-pay | Admitting: Emergency Medicine

## 2019-07-13 ENCOUNTER — Emergency Department (HOSPITAL_COMMUNITY): Payer: Self-pay

## 2019-07-13 DIAGNOSIS — R011 Cardiac murmur, unspecified: Secondary | ICD-10-CM | POA: Diagnosis present

## 2019-07-13 DIAGNOSIS — Z5331 Laparoscopic surgical procedure converted to open procedure: Secondary | ICD-10-CM

## 2019-07-13 DIAGNOSIS — D573 Sickle-cell trait: Secondary | ICD-10-CM | POA: Diagnosis present

## 2019-07-13 DIAGNOSIS — C189 Malignant neoplasm of colon, unspecified: Secondary | ICD-10-CM | POA: Diagnosis present

## 2019-07-13 DIAGNOSIS — C188 Malignant neoplasm of overlapping sites of colon: Secondary | ICD-10-CM | POA: Diagnosis present

## 2019-07-13 DIAGNOSIS — Z21 Asymptomatic human immunodeficiency virus [HIV] infection status: Secondary | ICD-10-CM | POA: Diagnosis present

## 2019-07-13 DIAGNOSIS — D62 Acute posthemorrhagic anemia: Secondary | ICD-10-CM | POA: Diagnosis not present

## 2019-07-13 DIAGNOSIS — K219 Gastro-esophageal reflux disease without esophagitis: Secondary | ICD-10-CM | POA: Diagnosis present

## 2019-07-13 DIAGNOSIS — Z83438 Family history of other disorder of lipoprotein metabolism and other lipidemia: Secondary | ICD-10-CM

## 2019-07-13 DIAGNOSIS — D259 Leiomyoma of uterus, unspecified: Secondary | ICD-10-CM | POA: Diagnosis present

## 2019-07-13 DIAGNOSIS — Z808 Family history of malignant neoplasm of other organs or systems: Secondary | ICD-10-CM

## 2019-07-13 DIAGNOSIS — K353 Acute appendicitis with localized peritonitis, without perforation or gangrene: Principal | ICD-10-CM | POA: Diagnosis present

## 2019-07-13 DIAGNOSIS — K3580 Unspecified acute appendicitis: Secondary | ICD-10-CM | POA: Diagnosis present

## 2019-07-13 DIAGNOSIS — Z8249 Family history of ischemic heart disease and other diseases of the circulatory system: Secondary | ICD-10-CM

## 2019-07-13 DIAGNOSIS — E876 Hypokalemia: Secondary | ICD-10-CM | POA: Diagnosis not present

## 2019-07-13 DIAGNOSIS — K567 Ileus, unspecified: Secondary | ICD-10-CM | POA: Diagnosis not present

## 2019-07-13 DIAGNOSIS — Z20822 Contact with and (suspected) exposure to covid-19: Secondary | ICD-10-CM | POA: Diagnosis present

## 2019-07-13 LAB — URINALYSIS, ROUTINE W REFLEX MICROSCOPIC
Bilirubin Urine: NEGATIVE
Glucose, UA: NEGATIVE mg/dL
Hgb urine dipstick: NEGATIVE
Ketones, ur: NEGATIVE mg/dL
Nitrite: NEGATIVE
Protein, ur: 100 mg/dL — AB
Specific Gravity, Urine: 1.013 (ref 1.005–1.030)
pH: 5 (ref 5.0–8.0)

## 2019-07-13 LAB — COMPREHENSIVE METABOLIC PANEL
ALT: 10 U/L (ref 0–44)
AST: 13 U/L — ABNORMAL LOW (ref 15–41)
Albumin: 3.4 g/dL — ABNORMAL LOW (ref 3.5–5.0)
Alkaline Phosphatase: 63 U/L (ref 38–126)
Anion gap: 12 (ref 5–15)
BUN: 6 mg/dL (ref 6–20)
CO2: 21 mmol/L — ABNORMAL LOW (ref 22–32)
Calcium: 9 mg/dL (ref 8.9–10.3)
Chloride: 102 mmol/L (ref 98–111)
Creatinine, Ser: 0.52 mg/dL (ref 0.44–1.00)
GFR calc Af Amer: >60 mL/min (ref >60)
GFR calc non Af Amer: >60 mL/min (ref >60)
Glucose, Bld: 88 mg/dL (ref 70–99)
Potassium: 3.7 mmol/L (ref 3.5–5.1)
Sodium: 135 mmol/L (ref 135–145)
Total Bilirubin: 0.8 mg/dL (ref 0.3–1.2)
Total Protein: 7.9 g/dL (ref 6.5–8.1)

## 2019-07-13 LAB — CBC WITH DIFFERENTIAL/PLATELET
Abs Immature Granulocytes: 0.03 10*3/uL (ref 0.00–0.07)
Basophils Absolute: 0.1 10*3/uL (ref 0.0–0.1)
Basophils Relative: 1 %
Eosinophils Absolute: 0.1 10*3/uL (ref 0.0–0.5)
Eosinophils Relative: 1 %
HCT: 27.3 % — ABNORMAL LOW (ref 36.0–46.0)
Hemoglobin: 7.7 g/dL — ABNORMAL LOW (ref 12.0–15.0)
Immature Granulocytes: 0 %
Lymphocytes Relative: 17 %
Lymphs Abs: 1.7 10*3/uL (ref 0.7–4.0)
MCH: 17.6 pg — ABNORMAL LOW (ref 26.0–34.0)
MCHC: 28.2 g/dL — ABNORMAL LOW (ref 30.0–36.0)
MCV: 62.3 fL — ABNORMAL LOW (ref 80.0–100.0)
Monocytes Absolute: 1.2 10*3/uL — ABNORMAL HIGH (ref 0.1–1.0)
Monocytes Relative: 12 %
Neutro Abs: 6.8 10*3/uL (ref 1.7–7.7)
Neutrophils Relative %: 69 %
Platelets: 520 10*3/uL — ABNORMAL HIGH (ref 150–400)
RBC: 4.38 MIL/uL (ref 3.87–5.11)
RDW: 19.3 % — ABNORMAL HIGH (ref 11.5–15.5)
WBC: 9.9 10*3/uL (ref 4.0–10.5)
nRBC: 0 % (ref 0.0–0.2)

## 2019-07-13 LAB — LIPASE, BLOOD: Lipase: 21 U/L (ref 11–51)

## 2019-07-13 LAB — RESPIRATORY PANEL BY RT PCR (FLU A&B, COVID)
Influenza A by PCR: NEGATIVE
Influenza B by PCR: NEGATIVE
SARS Coronavirus 2 by RT PCR: NEGATIVE

## 2019-07-13 MED ORDER — ACETAMINOPHEN 500 MG PO TABS
1000.0000 mg | ORAL_TABLET | Freq: Four times a day (QID) | ORAL | Status: DC
  Administered 2019-07-14 – 2019-07-20 (×5): 1000 mg via ORAL
  Filled 2019-07-13 (×15): qty 2

## 2019-07-13 MED ORDER — DIPHENHYDRAMINE HCL 25 MG PO CAPS: 25.0000 mg | ORAL_CAPSULE | Freq: Four times a day (QID) | ORAL | Status: DC | PRN

## 2019-07-13 MED ORDER — ONDANSETRON HCL 4 MG/2ML IJ SOLN
4.0000 mg | Freq: Four times a day (QID) | INTRAMUSCULAR | Status: DC | PRN
  Administered 2019-07-15 – 2019-07-18 (×4): 4 mg via INTRAVENOUS
  Filled 2019-07-13 (×4): qty 2

## 2019-07-13 MED ORDER — IOHEXOL 300 MG/ML  SOLN
100.0000 mL | Freq: Once | INTRAMUSCULAR | Status: AC | PRN
  Administered 2019-07-13: 20:00:00 100 mL via INTRAVENOUS

## 2019-07-13 MED ORDER — SODIUM CHLORIDE 0.9 % IV SOLN: INTRAVENOUS | Status: DC

## 2019-07-13 MED ORDER — BISACODYL 10 MG RE SUPP: 10.0000 mg | Freq: Every day | RECTAL | Status: DC | PRN

## 2019-07-13 MED ORDER — METOPROLOL TARTRATE 5 MG/5ML IV SOLN: 5.0000 mg | Freq: Four times a day (QID) | INTRAVENOUS | Status: DC | PRN

## 2019-07-13 MED ORDER — DOCUSATE SODIUM 100 MG PO CAPS
100.0000 mg | ORAL_CAPSULE | Freq: Two times a day (BID) | ORAL | Status: DC
  Administered 2019-07-13: 22:00:00 100 mg via ORAL
  Filled 2019-07-13: qty 1

## 2019-07-13 MED ORDER — ONDANSETRON HCL 4 MG/2ML IJ SOLN
4.0000 mg | Freq: Once | INTRAMUSCULAR | Status: AC
  Administered 2019-07-13: 4 mg via INTRAVENOUS
  Filled 2019-07-13: qty 2

## 2019-07-13 MED ORDER — ENOXAPARIN SODIUM 40 MG/0.4ML ~~LOC~~ SOLN
40.0000 mg | SUBCUTANEOUS | Status: DC
  Administered 2019-07-13: 22:00:00 40 mg via SUBCUTANEOUS
  Filled 2019-07-13: qty 0.4

## 2019-07-13 MED ORDER — METRONIDAZOLE IN NACL 5-0.79 MG/ML-% IV SOLN
500.0000 mg | Freq: Three times a day (TID) | INTRAVENOUS | Status: DC
  Administered 2019-07-14 – 2019-07-18 (×11): 500 mg via INTRAVENOUS
  Filled 2019-07-13 (×11): qty 100

## 2019-07-13 MED ORDER — SODIUM CHLORIDE 0.9 % IV BOLUS
1000.0000 mL | Freq: Once | INTRAVENOUS | Status: AC
  Administered 2019-07-13: 1000 mL via INTRAVENOUS

## 2019-07-13 MED ORDER — DIPHENHYDRAMINE HCL 50 MG/ML IJ SOLN
25.0000 mg | Freq: Four times a day (QID) | INTRAMUSCULAR | Status: DC | PRN
  Administered 2019-07-15: 25 mg via INTRAVENOUS
  Filled 2019-07-13: qty 1

## 2019-07-13 MED ORDER — ONDANSETRON 4 MG PO TBDP: 4.0000 mg | ORAL_TABLET | Freq: Four times a day (QID) | ORAL | Status: DC | PRN

## 2019-07-13 MED ORDER — HYDROMORPHONE HCL 1 MG/ML IJ SOLN: 0.5000 mg | INTRAMUSCULAR | Status: DC | PRN

## 2019-07-13 MED ORDER — BICTEGRAVIR-EMTRICITAB-TENOFOV 50-200-25 MG PO TABS
1.0000 | ORAL_TABLET | Freq: Every day | ORAL | Status: DC
  Administered 2019-07-14 – 2019-07-20 (×6): 1 via ORAL
  Filled 2019-07-13 (×8): qty 1

## 2019-07-13 MED ORDER — SODIUM CHLORIDE 0.9 % IV SOLN
2.0000 g | INTRAVENOUS | Status: DC
  Administered 2019-07-14 – 2019-07-17 (×4): 2 g via INTRAVENOUS
  Filled 2019-07-13 (×4): qty 20

## 2019-07-13 MED ORDER — HYDRALAZINE HCL 20 MG/ML IJ SOLN: 10.0000 mg | INTRAMUSCULAR | Status: DC | PRN

## 2019-07-13 MED ORDER — METRONIDAZOLE IN NACL 5-0.79 MG/ML-% IV SOLN
500.0000 mg | Freq: Once | INTRAVENOUS | Status: AC
  Administered 2019-07-13: 21:00:00 500 mg via INTRAVENOUS
  Filled 2019-07-13: qty 100

## 2019-07-13 MED ORDER — TRAMADOL HCL 50 MG PO TABS: 50.0000 mg | ORAL_TABLET | Freq: Four times a day (QID) | ORAL | Status: DC | PRN

## 2019-07-13 MED ORDER — HYDROMORPHONE HCL 1 MG/ML IJ SOLN
0.5000 mg | Freq: Once | INTRAMUSCULAR | Status: AC
  Administered 2019-07-13: 20:00:00 0.5 mg via INTRAVENOUS
  Filled 2019-07-13: qty 1

## 2019-07-13 MED ORDER — OXYCODONE HCL 5 MG PO TABS
5.0000 mg | ORAL_TABLET | ORAL | Status: DC | PRN
  Administered 2019-07-14 – 2019-07-18 (×6): 10 mg via ORAL
  Filled 2019-07-13 (×8): qty 2

## 2019-07-13 MED ORDER — FENTANYL CITRATE (PF) 100 MCG/2ML IJ SOLN
50.0000 ug | Freq: Once | INTRAMUSCULAR | Status: AC
  Administered 2019-07-13: 18:00:00 50 ug via INTRAVENOUS
  Filled 2019-07-13: qty 2

## 2019-07-13 MED ORDER — SODIUM CHLORIDE 0.9 % IV SOLN
2.0000 g | Freq: Once | INTRAVENOUS | Status: AC
  Administered 2019-07-13: 2 g via INTRAVENOUS
  Filled 2019-07-13: qty 20

## 2019-07-13 MED ORDER — ONDANSETRON HCL 4 MG/2ML IJ SOLN
4.0000 mg | Freq: Once | INTRAMUSCULAR | Status: AC
  Administered 2019-07-13: 18:00:00 4 mg via INTRAVENOUS
  Filled 2019-07-13: qty 2

## 2019-07-13 NOTE — H&P (Signed)
Surgical Evaluation Requesting provider: Dr. Roslynn Amble  Chief Complaint: abdominal pain  HPI: 49yo woman with a history of HIV and anemia (attributed to menorrhagia) presents to ER with abdominal pain. She has been having intermittent nausea for about 3 weeks. Denies vomiting. Yesterday developed general abdominal discomfort which has localized to the right lower quadrant today and intensified. Denies melena or hematochezia, reports a normal bowel movement yesterday. Denies fever. Pain is alleviated with medications in the ER.   She had a colonoscopy in August 2020 (Dr. 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   She works at [employer].   No Known Allergies  Past Medical History:  Diagnosis Date  . Anemia   . GERD (gastroesophageal reflux disease)    no meds  . Heart murmur   . HIV disease [facility])     Past Surgical History:  Procedure Laterality Date  . CESAREAN SECTION    . ESOPHAGOGASTRODUODENOSCOPY N/A 02/03/2016   Procedure: ESOPHAGOGASTRODUODENOSCOPY (EGD);  Surgeon: Manus Gunning, MD;  Location: [facility];  Service: Gastroenterology;  Laterality: N/A;  . RADIOACTIVE SEED GUIDED EXCISIONAL BREAST BIOPSY Right 06/23/2018   Procedure: RADIOACTIVE SEED GUIDED EXCISIONAL RIGHT BREAST BIOPSY;  Surgeon: Stark Klein, MD;  Location: [place];  Service: General;  Laterality: Right;    Family History  Problem Relation Age of Onset  . Hyperlipidemia Mother   . Hypertension Mother   . Throat cancer Father   . Leukemia Neg Hx   . Lymphoma Neg Hx   . Colon cancer Neg Hx   . Esophageal cancer Neg Hx   . Stomach cancer Neg Hx   . Rectal cancer Neg Hx     Social History   Socioeconomic History  . Marital status: Single    Spouse name: Not on file  . Number of children: 4  . Years of education: Not on file  . Highest education level: 12th grade   Occupational History  . Not on file  Tobacco Use  . Smoking status: Never Smoker  . Smokeless tobacco: Never Used  Substance and Sexual Activity  . Alcohol use: Yes    Alcohol/week: 1.0 standard drinks    Types: 1 Cans of beer per week    Comment: occa  . Drug use: Not Currently    Types: Marijuana  . Sexual activity: Not Currently    Partners: Male    Birth control/protection: Condom    Comment: given condoms  Other Topics Concern  . Not on file  Social History Narrative   Patient lives alone   Patient has to take public transportation   Social Determinants of Health   Financial Resource Strain:   . Difficulty of Paying Living Expenses:   Food Insecurity:   . Worried About Charity fundraiser in the Last Year:   . Arboriculturist in the Last Year:   Transportation Needs:   . Film/video editor (Medical):   Marland Kitchen Lack of Transportation (Non-Medical):   Physical Activity:   . Days of Exercise per Week:   . Minutes of Exercise per Session:   Stress:   . Feeling of Stress :   Social Connections:   . Frequency of Communication with Friends and Family:   . Frequency of Social Gatherings with Friends and Family:   . Attends Religious Services:   . Active Member of Clubs or Organizations:   . Attends Archivist Meetings:   .  Marital Status:     No current facility-administered medications on file prior to encounter.   Current Outpatient Medications on File Prior to Encounter  Medication Sig Dispense Refill  . acetaminophen-codeine (TYLENOL #3) 300-30 MG tablet Take 1 tablet by mouth every 4 (four) hours as needed for moderate pain. 30 tablet 0  . bictegravir-emtricitabine-tenofovir AF (BIKTARVY) 50-200-25 MG TABS tablet TAKE 1 TABLET BY MOUTH DAILY, TO TAKE AT THE SAME TIME EACH DAY WITH OR WITHOUT FOOD (Patient taking differently: Take 1 tablet by mouth See admin instructions. TAKE 1 TABLET BY MOUTH DAILY, TO TAKE AT THE SAME TIME EACH DAY WITH OR WITHOUT  FOOD) 30 tablet 11  . ferrous sulfate 325 (65 FE) MG EC tablet Take 325 mg by mouth 2 (two) times daily with a meal.    . ondansetron (ZOFRAN ODT) 4 MG disintegrating tablet Take 1 tablet (4 mg total) by mouth every 8 (eight) hours as needed for nausea or vomiting. 30 tablet 1    Review of Systems: a complete, 10pt review of systems was completed with pertinent positives and negatives as documented in the HPI  Physical Exam: Vitals:   07/13/19 1220 07/13/19 1424  BP: (!) 156/75 120/61  Pulse: 94 82  Resp: 17 16  Temp: 98.5 F (36.9 C)   SpO2: 100% 100%   Gen: A&Ox3, no distress  Eyes: lids and conjunctivae normal, no icterus. Pupils equally round and reactive to light.  Neck: supple without mass or thyromegaly Chest: respiratory effort is normal. No crepitus or tenderness on palpation of the chest. Breath sounds equal.  Cardiovascular: RRR with palpable distal pulses, no pedal edema Gastrointestinal: soft, nondistended, tender in RLQ without peritoneal signs. No mass, hepatomegaly or splenomegaly. Well healed pfannenstiel  Lymphatic: no lymphadenopathy in the neck or groin Muscoloskeletal: no clubbing or cyanosis of the fingers.  Strength is symmetrical throughout.  Range of motion of bilateral upper and lower extremities normal without pain, crepitation or contracture. Neuro: cranial nerves grossly intact.  Sensation intact to light touch diffusely. Psych: appropriate mood and affect, normal insight/judgment intact  Skin: warm and dry   CBC Latest Ref Rng & Units 07/13/2019 06/23/2019 02/24/2019  WBC 4.0 - 10.5 K/uL 9.9 6.8 5.4  Hemoglobin 12.0 - 15.0 g/dL 7.7(L) 7.0(L) 7.9(L)  Hematocrit 36.0 - 46.0 % 27.3(L) 24.5(L) 26.1(L)  Platelets 150 - 400 K/uL 520(H) 547(H) 499(H)    CMP Latest Ref Rng & Units 07/13/2019 10/18/2018 09/23/2018  Glucose 70 - 99 mg/dL 88 91 84  BUN 6 - 20 mg/dL 6 11 8   Creatinine 0.44 - 1.00 mg/dL 0.52 0.51 0.64  Sodium 135 - 145 mmol/L 135 137 138   Potassium 3.5 - 5.1 mmol/L 3.7 4.5 3.7  Chloride 98 - 111 mmol/L 102 105 108  CO2 22 - 32 mmol/L 21(L) 26 22  Calcium 8.9 - 10.3 mg/dL 9.0 9.3 8.2(L)  Total Protein 6.5 - 8.1 g/dL 7.9 7.9 7.6  Total Bilirubin 0.3 - 1.2 mg/dL 0.8 0.9 0.6  Alkaline Phos 38 - 126 U/L 63 - 52  AST 15 - 41 U/L 13(L) 11 13(L)  ALT 0 - 44 U/L 10 11 13     Lab Results  Component Value Date   INR 1.09 07/14/2017   INR 1.14 02/03/2016    Imaging: CT ABDOMEN PELVIS W CONTRAST  Result Date: 07/13/2019 CLINICAL DATA:  Right lower quadrant pain EXAM: CT ABDOMEN AND PELVIS WITH CONTRAST TECHNIQUE: Multidetector CT imaging of the abdomen and pelvis was performed using  the standard protocol following bolus administration of intravenous contrast. CONTRAST:  130mL OMNIPAQUE IOHEXOL 300 MG/ML  SOLN COMPARISON:  CT 11/02/2018 FINDINGS: Lower chest: Lung bases demonstrate no acute consolidation or effusion. The heart size is within normal limits. Hepatobiliary: No focal liver abnormality is seen. No gallstones, gallbladder wall thickening, or biliary dilatation. Pancreas: Unremarkable. No pancreatic ductal dilatation or surrounding inflammatory changes. Spleen: Normal in size without focal abnormality. Adrenals/Urinary Tract: Adrenal glands are normal. Kidneys show no hydronephrosis. Cyst in the midpole of the left kidney. Slightly thick-walled appearance of the urinary bladder Stomach/Bowel: The stomach is nonenlarged. No dilated small bowel. Abnormal appendix. Dilatation of the tip up to 15 mm with prominent mucosal enhancement. Inflammatory masslike thickening of the more proximal appendix extending to the base and cecum. No calcified stones. Vascular/Lymphatic: Nonaneurysmal aorta. Subcentimeter right lower quadrant mesenteric nodes. Reproductive: Enlarged uterus with multiple masses presumably fibroids. 2.1 cm rim enhancing slightly dense left adnexal lesion. Other: No free air. Small amount of fluid in the right lower quadrant.  Musculoskeletal: No acute or suspicious osseous abnormality. IMPRESSION: 1. Abnormal appendix. Diffuse inflammatory masslike thickening of the appendix with dilatation of the tip that demonstrates prominent mucosal enhancement and possible intraluminal mucinous type secretions. Appearance of the appendix is not typical for routine appendicitis, therefore also consider obstructing inflammatory appendiceal mass lesion such as mucocele or other appendix mass, resulting in acute appendicitis. No free air to suggest perforation. 2. Enlarged uterus with multiple fibroids 3. Thick-walled bladder either due to under distension or cystitis. Electronically Signed   By: Donavan Foil M.D.   On: 07/13/2019 20:10     A/P: 49yo woman, history is consistent with acute appendicitis and CT is notable for abnormal appendix/ atypical appearance for appendicitis, see above. Given normal c-scope 7 months ago obstructing colonic lesion is unlikely. Will admit, empiric abx, symptom-directed supportive care, and plan possible appendectomy tomorrow. We discussed the surgery including risks of bleeding, infection, pain, scarring, injury to intra-abdominal structures, conversion to open surgery or more extensive resection, risk of staple line leak or delayed abscess, failure to resolve symptoms, postoperative ileus, incisional hernia, as well as general risks of DVT/PE, pneumonia, stroke, heart attack, death. Questions were welcomed and answered to the patient's satisfaction.     ADMISSION STATUS: obs  Patient Active Problem List   Diagnosis Date Noted  . Acute appendicitis 07/13/2019  . Nausea 06/23/2019  . Syncope, vasovagal 06/23/2019  . Skin tag of labia 11/01/2018  . HIV (human immunodeficiency virus infection) ([place]) 07/14/2018  . 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 Thrombocytopenia ([place]) 12/12/2017  . Routine health maintenance 08/28/2016  . H/O menorrhagia 09/05/2014  . Iron deficiency anemia 09/05/2014  .  Sickle-cell trait ([place]) 06/04/2006       Romana Juniper, MD [facility], [place]  See AMION to contact appropriate on-call provider

## 2019-07-13 NOTE — ED Notes (Signed)
Pt went to c-t 

## 2019-07-13 NOTE — ED Provider Notes (Signed)
[place] EMERGENCY DEPARTMENT Provider Note   CSN: [id number] Arrival date & time: 07/13/19  1217     History No chief complaint on file.   Shirley White is a 49 y.o. female.  Presents to ER with chief complaint abdominal pain.  Over the past couple weeks she has been noting some intermittent nausea and vomiting.  Vomit has been nonbloody nonbilious.  Last night started experiencing some abdominal pain, worse throughout the day today.  Works at [employer], had 3 chicken tenders and did not vomit.  Pain was worsened by food.  Pain currently 10 out of 10 in severity, right lower abdomen.  No lower pelvic pain, no vaginal bleeding, vaginal discharge, no history of STDs.  Normal menstrual cycle 3 or so weeks ago.  Known history of HIV, anemia.  Last CD4 count 347 on 06/23/2019.  Viral load undetectable.  HPI     Past Medical History:  Diagnosis Date  . Anemia   . GERD (gastroesophageal reflux disease)    no meds  . Heart murmur   . HIV disease [facility])     Patient Active Problem List   Diagnosis Date Noted  . Acute appendicitis 07/13/2019  . Nausea 06/23/2019  . Syncope, vasovagal 06/23/2019  . Skin tag of labia 11/01/2018  . HIV (human immunodeficiency virus infection) ([place]) 07/14/2018  . 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 Thrombocytopenia ([place]) 12/12/2017  . Routine health maintenance 08/28/2016  . H/O menorrhagia 09/05/2014  . Iron deficiency anemia 09/05/2014  . Sickle-cell trait ([place]) 06/04/2006    Past Surgical History:  Procedure Laterality Date  . CESAREAN SECTION    . ESOPHAGOGASTRODUODENOSCOPY N/A 02/03/2016   Procedure: ESOPHAGOGASTRODUODENOSCOPY (EGD);  Surgeon: Manus Gunning, MD;  Location: [facility];  Service: Gastroenterology;  Laterality: N/A;  . RADIOACTIVE SEED GUIDED EXCISIONAL BREAST BIOPSY Right 06/23/2018   Procedure: RADIOACTIVE SEED GUIDED EXCISIONAL RIGHT BREAST BIOPSY;  Surgeon: Stark Klein, MD;  Location:  [facility];  Service: General;  Laterality: Right;     OB History    Gravida  5   Para  4   Term  4   Preterm      AB  1   Living  4     SAB  0   TAB  1   Ectopic      Multiple      Live Births  4           Family History  Problem Relation Age of Onset  . Hyperlipidemia Mother   . Hypertension Mother   . Throat cancer Father   . Leukemia Neg Hx   . Lymphoma Neg Hx   . Colon cancer Neg Hx   . Esophageal cancer Neg Hx   . Stomach cancer Neg Hx   . Rectal cancer Neg Hx     Social History   Tobacco Use  . Smoking status: Never Smoker  . Smokeless tobacco: Never Used  Substance Use Topics  . Alcohol use: Yes    Alcohol/week: 1.0 standard drinks    Types: 1 Cans of beer per week    Comment: occa  . Drug use: Not Currently    Types: Marijuana    Home Medications Prior to Admission medications   Medication Sig Start Date End Date Taking? Authorizing Provider  bictegravir-emtricitabine-tenofovir AF (BIKTARVY) 50-200-25 MG TABS tablet TAKE 1 TABLET BY MOUTH DAILY, TO TAKE AT THE SAME TIME EACH DAY WITH OR WITHOUT FOOD  Patient taking differently: Take 1 tablet by mouth See admin instructions. Take 1 tablet by mouth in the morning, at the same time each day- with or without food 06/23/19  Yes Dixon, Melton Krebs, NP  ferrous sulfate 325 (65 FE) MG EC tablet Take 325 mg by mouth 2 (two) times daily with a meal.   Yes [provider]  ibuprofen (ADVIL) 200 MG tablet Take 400 mg by mouth every 6 (six) hours as needed for headache or mild pain.   Yes [provider]  ondansetron (ZOFRAN ODT) 4 MG disintegrating tablet Take 1 tablet (4 mg total) by mouth every 8 (eight) hours as needed for nausea or vomiting. Patient taking differently: Take 4 mg by mouth every 8 (eight) hours as needed for nausea or vomiting (DISSOLVE ORALLY).  06/23/19  Yes [place] Callas, NP  acetaminophen-codeine (TYLENOL #3) 300-30 MG tablet Take 1 tablet by  mouth every 4 (four) hours as needed for moderate pain. Patient not taking: Reported on 07/13/2019 11/02/18   [place] Callas, NP    Allergies    Patient has no known allergies.  Review of Systems   Review of Systems  Constitutional: Negative for chills and fever.  HENT: Negative for ear pain and sore throat.   Eyes: Negative for pain and visual disturbance.  Respiratory: Negative for cough and shortness of breath.   Cardiovascular: Negative for chest pain and palpitations.  Gastrointestinal: Positive for abdominal pain, nausea and vomiting.  Genitourinary: Negative for dysuria and hematuria.  Musculoskeletal: Negative for arthralgias and back pain.  Skin: Negative for color change and rash.  Neurological: Negative for seizures and syncope.  All other systems reviewed and are negative.   Physical Exam Updated Vital Signs BP (!) 147/84 (BP Location: Right Arm)   Pulse 92   Temp 99 F (37.2 C) (Oral)   Resp 16   Ht 5\' 1"  (1.549 m)   Wt 50.3 kg   SpO2 100%   BMI 20.97 kg/m   Physical Exam Vitals and nursing note reviewed.  Constitutional:      General: She is not in acute distress.    Appearance: She is well-developed.  HENT:     Head: Normocephalic and atraumatic.     Nose: Nose normal.     Mouth/Throat:     Mouth: Mucous membranes are moist.  Eyes:     Conjunctiva/sclera: Conjunctivae normal.  Cardiovascular:     Rate and Rhythm: Normal rate and regular rhythm.     Heart sounds: No murmur.  Pulmonary:     Effort: Pulmonary effort is normal. No respiratory distress.     Breath sounds: Normal breath sounds.  Abdominal:     Palpations: Abdomen is soft.     Comments: TTP in right mid and RLQ, no rebound or guarding  Musculoskeletal:        General: No deformity or signs of injury.     Cervical back: Neck supple.  Skin:    General: Skin is warm and dry.  Neurological:     General: No focal deficit present.     Mental Status: She is alert and oriented to  person, place, and time.  Psychiatric:        Mood and Affect: Mood normal.        Behavior: Behavior normal.     ED Results / Procedures / Treatments   Labs (all labs ordered are listed, but only abnormal results are displayed) Labs Reviewed  COMPREHENSIVE METABOLIC PANEL -  Abnormal; Notable for the following components:      Result Value   CO2 21 (*)    Albumin 3.4 (*)    AST 13 (*)    All other components within normal limits  CBC WITH DIFFERENTIAL/PLATELET - Abnormal; Notable for the following components:   Hemoglobin 7.7 (*)    HCT 27.3 (*)    MCV 62.3 (*)    MCH 17.6 (*)    MCHC 28.2 (*)    RDW 19.3 (*)    Platelets 520 (*)    Monocytes Absolute 1.2 (*)    All other components within normal limits  URINALYSIS, ROUTINE W REFLEX MICROSCOPIC - Abnormal; Notable for the following components:   APPearance HAZY (*)    Protein, ur 100 (*)    Leukocytes,Ua MODERATE (*)    Bacteria, UA MANY (*)    All other components within normal limits  RESPIRATORY PANEL BY RT PCR (FLU A&B, COVID)  SURGICAL PCR SCREEN  LIPASE, BLOOD  BASIC METABOLIC PANEL  CBC    EKG None  Radiology CT ABDOMEN PELVIS W CONTRAST  Result Date: 07/13/2019 CLINICAL DATA:  Right lower quadrant pain EXAM: CT ABDOMEN AND PELVIS WITH CONTRAST TECHNIQUE: Multidetector CT imaging of the abdomen and pelvis was performed using the standard protocol following bolus administration of intravenous contrast. CONTRAST:  12mL OMNIPAQUE IOHEXOL 300 MG/ML  SOLN COMPARISON:  CT 11/02/2018 FINDINGS: Lower chest: Lung bases demonstrate no acute consolidation or effusion. The heart size is within normal limits. Hepatobiliary: No focal liver abnormality is seen. No gallstones, gallbladder wall thickening, or biliary dilatation. Pancreas: Unremarkable. No pancreatic ductal dilatation or surrounding inflammatory changes. Spleen: Normal in size without focal abnormality. Adrenals/Urinary Tract: Adrenal glands are normal. Kidneys  show no hydronephrosis. Cyst in the midpole of the left kidney. Slightly thick-walled appearance of the urinary bladder Stomach/Bowel: The stomach is nonenlarged. No dilated small bowel. Abnormal appendix. Dilatation of the tip up to 15 mm with prominent mucosal enhancement. Inflammatory masslike thickening of the more proximal appendix extending to the base and cecum. No calcified stones. Vascular/Lymphatic: Nonaneurysmal aorta. Subcentimeter right lower quadrant mesenteric nodes. Reproductive: Enlarged uterus with multiple masses presumably fibroids. 2.1 cm rim enhancing slightly dense left adnexal lesion. Other: No free air. Small amount of fluid in the right lower quadrant. Musculoskeletal: No acute or suspicious osseous abnormality. IMPRESSION: 1. Abnormal appendix. Diffuse inflammatory masslike thickening of the appendix with dilatation of the tip that demonstrates prominent mucosal enhancement and possible intraluminal mucinous type secretions. Appearance of the appendix is not typical for routine appendicitis, therefore also consider obstructing inflammatory appendiceal mass lesion such as mucocele or other appendix mass, resulting in acute appendicitis. No free air to suggest perforation. 2. Enlarged uterus with multiple fibroids 3. Thick-walled bladder either due to under distension or cystitis. Electronically Signed   By: Donavan Foil M.D.   On: 07/13/2019 20:10    Procedures Procedures (including critical care time)  Medications Ordered in ED Medications  enoxaparin (LOVENOX) injection 40 mg (40 mg Subcutaneous Given 07/13/19 2134)  0.9 %  sodium chloride infusion ( Intravenous New Bag/Given 07/13/19 2133)  cefTRIAXone (ROCEPHIN) 2 g in sodium chloride 0.9 % 100 mL IVPB (has no administration in time range)    And  metroNIDAZOLE (FLAGYL) IVPB 500 mg (has no administration in time range)  acetaminophen (TYLENOL) tablet 1,000 mg (has no administration in time range)  traMADol (ULTRAM) tablet  50 mg (has no administration in time range)  oxyCODONE (Oxy IR/ROXICODONE) immediate release  tablet 5-10 mg (has no administration in time range)  HYDROmorphone (DILAUDID) injection 0.5 mg (has no administration in time range)  diphenhydrAMINE (BENADRYL) capsule 25 mg (has no administration in time range)    Or  diphenhydrAMINE (BENADRYL) injection 25 mg (has no administration in time range)  docusate sodium (COLACE) capsule 100 mg (100 mg Oral Given 07/13/19 2134)  bisacodyl (DULCOLAX) suppository 10 mg (has no administration in time range)  ondansetron (ZOFRAN-ODT) disintegrating tablet 4 mg (has no administration in time range)    Or  ondansetron (ZOFRAN) injection 4 mg (has no administration in time range)  metoprolol tartrate (LOPRESSOR) injection 5 mg (has no administration in time range)  hydrALAZINE (APRESOLINE) injection 10 mg (has no administration in time range)  bictegravir-emtricitabine-tenofovir AF (BIKTARVY) 50-200-25 MG per tablet 1 tablet (1 tablet Oral Refused 07/13/19 2211)  ondansetron (ZOFRAN) injection 4 mg (4 mg Intravenous Given 07/13/19 1800)  fentaNYL (SUBLIMAZE) injection 50 mcg (50 mcg Intravenous Given 07/13/19 1801)  sodium chloride 0.9 % bolus 1,000 mL (0 mLs Intravenous Stopped 07/13/19 2006)  HYDROmorphone (DILAUDID) injection 0.5 mg (0.5 mg Intravenous Given 07/13/19 2010)  iohexol (OMNIPAQUE) 300 MG/ML solution 100 mL (100 mLs Intravenous Contrast Given 07/13/19 1951)  cefTRIAXone (ROCEPHIN) 2 g in sodium chloride 0.9 % 100 mL IVPB (0 g Intravenous Stopped 07/13/19 2112)    And  metroNIDAZOLE (FLAGYL) IVPB 500 mg (0 mg Intravenous Stopped 07/13/19 2132)  ondansetron (ZOFRAN) injection 4 mg (4 mg Intravenous Given 07/13/19 2034)    ED Course  I have reviewed the triage vital signs and the nursing notes.  Pertinent labs & imaging results that were available during my care of the patient were reviewed by me and considered in my medical decision making (see chart for  details).    MDM Rules/Calculators/A&P                      49 year old lady presented to ER with right lower quadrant pain.  Noted focal tenderness on exam near McBurney's point.  Labs grossly within normal limits.  CT scan ordered to further evaluate concerning for acute appendicitis.  Started Rocephin, Flagyl, discussed with general surgery who will admit, likely appendectomy tomorrow.    Final Clinical Impression(s) / ED Diagnoses Final diagnoses:  Acute appendicitis with localized peritonitis, without perforation, abscess, or gangrene    Rx / DC Orders ED Discharge Orders    None       Lucrezia Starch, MD 07/14/19 [phone]

## 2019-07-13 NOTE — ED Triage Notes (Signed)
Pt here from work with c/o abd pin and some slight n/v .

## 2019-07-14 ENCOUNTER — Encounter (HOSPITAL_COMMUNITY): Payer: Self-pay

## 2019-07-14 ENCOUNTER — Observation Stay (HOSPITAL_COMMUNITY): Payer: Self-pay | Admitting: Anesthesiology

## 2019-07-14 ENCOUNTER — Encounter (HOSPITAL_COMMUNITY): Admission: EM | Disposition: A | Discharge status: Home / Self Care | Payer: Self-pay | Source: Home / Self Care

## 2019-07-14 HISTORY — PX: RIGHT COLECTOMY: SHX853

## 2019-07-14 HISTORY — PX: LAPAROSCOPIC APPENDECTOMY: SHX408

## 2019-07-14 LAB — BASIC METABOLIC PANEL
Anion gap: 11 (ref 5–15)
BUN: <5 mg/dL — ABNORMAL LOW (ref 6–20)
CO2: 21 mmol/L — ABNORMAL LOW (ref 22–32)
Calcium: 8.5 mg/dL — ABNORMAL LOW (ref 8.9–10.3)
Chloride: 105 mmol/L (ref 98–111)
Creatinine, Ser: 0.51 mg/dL (ref 0.44–1.00)
GFR calc Af Amer: >60 mL/min (ref >60)
GFR calc non Af Amer: >60 mL/min (ref >60)
Glucose, Bld: 79 mg/dL (ref 70–99)
Potassium: 3.8 mmol/L (ref 3.5–5.1)
Sodium: 137 mmol/L (ref 135–145)

## 2019-07-14 LAB — CBC
HCT: 24.1 % — ABNORMAL LOW (ref 36.0–46.0)
Hemoglobin: 6.9 g/dL — CL (ref 12.0–15.0)
MCH: 17.5 pg — ABNORMAL LOW (ref 26.0–34.0)
MCHC: 28.6 g/dL — ABNORMAL LOW (ref 30.0–36.0)
MCV: 61.2 fL — ABNORMAL LOW (ref 80.0–100.0)
Platelets: 475 10*3/uL — ABNORMAL HIGH (ref 150–400)
RBC: 3.94 MIL/uL (ref 3.87–5.11)
RDW: 19.5 % — ABNORMAL HIGH (ref 11.5–15.5)
WBC: 9.1 10*3/uL (ref 4.0–10.5)
nRBC: 0 % (ref 0.0–0.2)

## 2019-07-14 LAB — PREGNANCY, URINE: Preg Test, Ur: NEGATIVE

## 2019-07-14 LAB — SURGICAL PCR SCREEN
MRSA, PCR: NEGATIVE
Staphylococcus aureus: NEGATIVE

## 2019-07-14 LAB — PREPARE RBC (CROSSMATCH)

## 2019-07-14 SURGERY — APPENDECTOMY, LAPAROSCOPIC
Anesthesia: General | Site: Abdomen

## 2019-07-14 MED ORDER — FENTANYL CITRATE (PF) 100 MCG/2ML IJ SOLN: 25.0000 ug | INTRAMUSCULAR | Status: DC | PRN

## 2019-07-14 MED ORDER — PHENYLEPHRINE 40 MCG/ML (10ML) SYRINGE FOR IV PUSH (FOR BLOOD PRESSURE SUPPORT)
PREFILLED_SYRINGE | INTRAVENOUS | Status: DC | PRN
  Administered 2019-07-14: 80 ug via INTRAVENOUS

## 2019-07-14 MED ORDER — FENTANYL CITRATE (PF) 100 MCG/2ML IJ SOLN
INTRAMUSCULAR | Status: AC
  Filled 2019-07-14: qty 2

## 2019-07-14 MED ORDER — ONDANSETRON HCL 4 MG/2ML IJ SOLN
INTRAMUSCULAR | Status: DC | PRN
  Administered 2019-07-14: 4 mg via INTRAVENOUS

## 2019-07-14 MED ORDER — MIDAZOLAM HCL 2 MG/2ML IJ SOLN
INTRAMUSCULAR | Status: AC
  Filled 2019-07-14: qty 2

## 2019-07-14 MED ORDER — METOCLOPRAMIDE HCL 5 MG/ML IJ SOLN
5.0000 mg | Freq: Once | INTRAMUSCULAR | Status: AC
  Administered 2019-07-14: 5 mg via INTRAVENOUS

## 2019-07-14 MED ORDER — BUPIVACAINE HCL (PF) 0.5 % IJ SOLN
INTRAMUSCULAR | Status: AC
  Filled 2019-07-14: qty 30

## 2019-07-14 MED ORDER — SODIUM CHLORIDE 0.9 % IR SOLN
Status: DC | PRN
  Administered 2019-07-14: 1000 mL

## 2019-07-14 MED ORDER — ALBUMIN HUMAN 5 % IV SOLN: INTRAVENOUS | Status: DC | PRN

## 2019-07-14 MED ORDER — SODIUM CHLORIDE 0.9% IV SOLUTION: Freq: Once | INTRAVENOUS | Status: DC

## 2019-07-14 MED ORDER — CHLORHEXIDINE GLUCONATE 4 % EX LIQD
CUTANEOUS | Status: AC
  Filled 2019-07-14: qty 15

## 2019-07-14 MED ORDER — BUPIVACAINE HCL 0.5 % IJ SOLN
INTRAMUSCULAR | Status: DC | PRN
  Administered 2019-07-14: 20 mL

## 2019-07-14 MED ORDER — DEXAMETHASONE SODIUM PHOSPHATE 10 MG/ML IJ SOLN
INTRAMUSCULAR | Status: AC
  Filled 2019-07-14: qty 1

## 2019-07-14 MED ORDER — CEFAZOLIN SODIUM 1 G IJ SOLR
INTRAMUSCULAR | Status: AC
  Filled 2019-07-14: qty 10

## 2019-07-14 MED ORDER — FENTANYL CITRATE (PF) 250 MCG/5ML IJ SOLN
INTRAMUSCULAR | Status: DC | PRN
  Administered 2019-07-14: 100 ug via INTRAVENOUS
  Administered 2019-07-14: 50 ug via INTRAVENOUS
  Administered 2019-07-14: 100 ug via INTRAVENOUS
  Administered 2019-07-14: 50 ug via INTRAVENOUS

## 2019-07-14 MED ORDER — SUCCINYLCHOLINE CHLORIDE 200 MG/10ML IV SOSY
PREFILLED_SYRINGE | INTRAVENOUS | Status: DC | PRN
  Administered 2019-07-14: 140 mg via INTRAVENOUS

## 2019-07-14 MED ORDER — ENOXAPARIN SODIUM 40 MG/0.4ML ~~LOC~~ SOLN
40.0000 mg | SUBCUTANEOUS | Status: DC
  Administered 2019-07-15 – 2019-07-20 (×6): 40 mg via SUBCUTANEOUS
  Filled 2019-07-14 (×6): qty 0.4

## 2019-07-14 MED ORDER — PHENYLEPHRINE HCL-NACL 10-0.9 MG/250ML-% IV SOLN
INTRAVENOUS | Status: DC | PRN
  Administered 2019-07-14: 25 ug/min via INTRAVENOUS

## 2019-07-14 MED ORDER — PHENYLEPHRINE HCL (PRESSORS) 10 MG/ML IV SOLN
INTRAVENOUS | Status: AC
  Filled 2019-07-14: qty 1

## 2019-07-14 MED ORDER — ACETAMINOPHEN 10 MG/ML IV SOLN
INTRAVENOUS | Status: AC
  Filled 2019-07-14: qty 100

## 2019-07-14 MED ORDER — HYDROMORPHONE HCL 1 MG/ML IJ SOLN
1.0000 mg | INTRAMUSCULAR | Status: DC | PRN
  Administered 2019-07-14 – 2019-07-17 (×9): 1 mg via INTRAVENOUS
  Filled 2019-07-14 (×12): qty 1

## 2019-07-14 MED ORDER — SODIUM CHLORIDE 0.9% IV SOLUTION: Freq: Once | INTRAVENOUS | Status: AC

## 2019-07-14 MED ORDER — DEXAMETHASONE SODIUM PHOSPHATE 10 MG/ML IJ SOLN
INTRAMUSCULAR | Status: DC | PRN
  Administered 2019-07-14: 4 mg via INTRAVENOUS

## 2019-07-14 MED ORDER — FENTANYL CITRATE (PF) 250 MCG/5ML IJ SOLN
INTRAMUSCULAR | Status: AC
  Filled 2019-07-14: qty 5

## 2019-07-14 MED ORDER — KETOROLAC TROMETHAMINE 30 MG/ML IJ SOLN
INTRAMUSCULAR | Status: DC | PRN
  Administered 2019-07-14: 30 mg via INTRAVENOUS

## 2019-07-14 MED ORDER — NALOXONE HCL 0.4 MG/ML IJ SOLN
INTRAMUSCULAR | Status: DC | PRN
  Administered 2019-07-14: 80 ug via INTRAVENOUS

## 2019-07-14 MED ORDER — CEFAZOLIN SODIUM-DEXTROSE 1-4 GM/50ML-% IV SOLN
INTRAVENOUS | Status: DC | PRN
  Administered 2019-07-14: 1 g via INTRAVENOUS

## 2019-07-14 MED ORDER — CHLORHEXIDINE GLUCONATE CLOTH 2 % EX PADS
6.0000 | MEDICATED_PAD | Freq: Every day | CUTANEOUS | Status: DC
  Administered 2019-07-15: 6 via TOPICAL

## 2019-07-14 MED ORDER — KETOROLAC TROMETHAMINE 30 MG/ML IJ SOLN
INTRAMUSCULAR | Status: AC
  Filled 2019-07-14: qty 1

## 2019-07-14 MED ORDER — LIDOCAINE 2% (20 MG/ML) 5 ML SYRINGE
INTRAMUSCULAR | Status: DC | PRN
  Administered 2019-07-14: 100 mg via INTRAVENOUS

## 2019-07-14 MED ORDER — METOCLOPRAMIDE HCL 5 MG/ML IJ SOLN
INTRAMUSCULAR | Status: AC
  Filled 2019-07-14: qty 2

## 2019-07-14 MED ORDER — ONDANSETRON HCL 4 MG/2ML IJ SOLN
INTRAMUSCULAR | Status: AC
  Filled 2019-07-14: qty 2

## 2019-07-14 MED ORDER — SUCCINYLCHOLINE CHLORIDE 200 MG/10ML IV SOSY
PREFILLED_SYRINGE | INTRAVENOUS | Status: AC
  Filled 2019-07-14: qty 10

## 2019-07-14 MED ORDER — ACETAMINOPHEN 10 MG/ML IV SOLN
INTRAVENOUS | Status: DC | PRN
  Administered 2019-07-14: 1000 mg via INTRAVENOUS

## 2019-07-14 MED ORDER — LACTATED RINGERS IV SOLN: INTRAVENOUS | Status: DC

## 2019-07-14 MED ORDER — PROPOFOL 10 MG/ML IV BOLUS
INTRAVENOUS | Status: DC | PRN
  Administered 2019-07-14: 160 mg via INTRAVENOUS

## 2019-07-14 MED ORDER — LIDOCAINE 2% (20 MG/ML) 5 ML SYRINGE
INTRAMUSCULAR | Status: AC
  Filled 2019-07-14: qty 5

## 2019-07-14 MED ORDER — SUGAMMADEX SODIUM 200 MG/2ML IV SOLN
INTRAVENOUS | Status: DC | PRN
  Administered 2019-07-14: 200 mg via INTRAVENOUS

## 2019-07-14 MED ORDER — NALOXONE HCL 0.4 MG/ML IJ SOLN
INTRAMUSCULAR | Status: AC
  Filled 2019-07-14: qty 1

## 2019-07-14 MED ORDER — PHENYLEPHRINE 40 MCG/ML (10ML) SYRINGE FOR IV PUSH (FOR BLOOD PRESSURE SUPPORT)
PREFILLED_SYRINGE | INTRAVENOUS | Status: AC
  Filled 2019-07-14: qty 10

## 2019-07-14 MED ORDER — 0.9 % SODIUM CHLORIDE (POUR BTL) OPTIME
TOPICAL | Status: DC | PRN
  Administered 2019-07-14: 1000 mL

## 2019-07-14 MED ORDER — ROCURONIUM BROMIDE 10 MG/ML (PF) SYRINGE
PREFILLED_SYRINGE | INTRAVENOUS | Status: DC | PRN
  Administered 2019-07-14: 30 mg via INTRAVENOUS

## 2019-07-14 MED ORDER — MIDAZOLAM HCL 5 MG/5ML IJ SOLN
INTRAMUSCULAR | Status: DC | PRN
  Administered 2019-07-14: 2 mg via INTRAVENOUS

## 2019-07-14 SURGICAL SUPPLY — 61 items
APPLIER CLIP 5 13 M/L LIGAMAX5 (MISCELLANEOUS)
APPLIER CLIP ROT 10 11.4 M/L (STAPLE)
CANISTER SUCT 3000ML PPV (MISCELLANEOUS) ×3 IMPLANT
CHLORAPREP W/TINT 26 (MISCELLANEOUS) ×3 IMPLANT
CLIP APPLIE 5 13 M/L LIGAMAX5 (MISCELLANEOUS) IMPLANT
CLIP APPLIE ROT 10 11.4 M/L (STAPLE) IMPLANT
COUNTER NEEDLE 20 DBL MAG RED (NEEDLE) ×2 IMPLANT
COVER SURGICAL LIGHT HANDLE (MISCELLANEOUS) ×5 IMPLANT
COVER WAND RF STERILE (DRAPES) ×3 IMPLANT
CUTTER FLEX LINEAR 45M (STAPLE) IMPLANT
DERMABOND ADVANCED (GAUZE/BANDAGES/DRESSINGS) ×2
DERMABOND ADVANCED .7 DNX12 (GAUZE/BANDAGES/DRESSINGS) ×1 IMPLANT
DRAPE HALF SHEET 40X57 (DRAPES) ×4 IMPLANT
DRAPE UTILITY XL STRL (DRAPES) ×2 IMPLANT
ELECT CAUTERY BLADE 6.4 (BLADE) ×2 IMPLANT
ELECT REM PT RETURN 9FT ADLT (ELECTROSURGICAL) ×3
ELECTRODE REM PT RTRN 9FT ADLT (ELECTROSURGICAL) ×1 IMPLANT
GLOVE BIOGEL PI IND STRL 7.0 (GLOVE) IMPLANT
GLOVE BIOGEL PI INDICATOR 7.0 (GLOVE) ×2
GLOVE SURG SIGNA 7.5 PF LTX (GLOVE) ×3 IMPLANT
GLOVE SURG SS PI 6.5 STRL IVOR (GLOVE) ×2 IMPLANT
GOWN STRL REUS W/ TWL LRG LVL3 (GOWN DISPOSABLE) ×2 IMPLANT
GOWN STRL REUS W/ TWL XL LVL3 (GOWN DISPOSABLE) ×1 IMPLANT
GOWN STRL REUS W/TWL LRG LVL3 (GOWN DISPOSABLE) ×6
GOWN STRL REUS W/TWL XL LVL3 (GOWN DISPOSABLE) ×3
HANDLE SUCTION POOLE (INSTRUMENTS) IMPLANT
KIT BASIN OR (CUSTOM PROCEDURE TRAY) ×3 IMPLANT
KIT TURNOVER KIT B (KITS) ×3 IMPLANT
LIGASURE IMPACT 36 18CM CVD LR (INSTRUMENTS) ×2 IMPLANT
NDL HYPO 25GX1X1/2 BEV (NEEDLE) IMPLANT
NEEDLE HYPO 25GX1X1/2 BEV (NEEDLE) ×3 IMPLANT
NS IRRIG 1000ML POUR BTL (IV SOLUTION) ×3 IMPLANT
PAD ARMBOARD 7.5X6 YLW CONV (MISCELLANEOUS) ×6 IMPLANT
PENCIL BUTTON HOLSTER BLD 10FT (ELECTRODE) ×2 IMPLANT
POUCH SPECIMEN RETRIEVAL 10MM (ENDOMECHANICALS) ×3 IMPLANT
RELOAD 45 VASCULAR/THIN (ENDOMECHANICALS) IMPLANT
RELOAD PROXIMATE 75MM BLUE (ENDOMECHANICALS) ×6 IMPLANT
RELOAD STAPLE 45 2.5 WHT GRN (ENDOMECHANICALS) IMPLANT
RELOAD STAPLE 45 3.5 BLU ETS (ENDOMECHANICALS) IMPLANT
RELOAD STAPLE 75 3.8 BLU REG (ENDOMECHANICALS) IMPLANT
RELOAD STAPLE TA45 3.5 REG BLU (ENDOMECHANICALS) IMPLANT
SET IRRIG TUBING LAPAROSCOPIC (IRRIGATION / IRRIGATOR) ×3 IMPLANT
SET TUBE SMOKE EVAC HIGH FLOW (TUBING) ×3 IMPLANT
SHEARS HARMONIC ACE PLUS 36CM (ENDOMECHANICALS) ×3 IMPLANT
SLEEVE ENDOPATH XCEL 5M (ENDOMECHANICALS) ×3 IMPLANT
SPECIMEN JAR SMALL (MISCELLANEOUS) ×3 IMPLANT
STAPLER GUN LINEAR PROX 60 (STAPLE) ×2 IMPLANT
SUCTION POOLE HANDLE (INSTRUMENTS) ×3
SUT MON AB 4-0 PC3 18 (SUTURE) ×3 IMPLANT
SUT PDS AB 1 TP1 54 (SUTURE) ×6 IMPLANT
SUT SILK 2 0 SH CR/8 (SUTURE) ×2 IMPLANT
SUT SILK 3 0 SH CR/8 (SUTURE) ×2 IMPLANT
SYR CONTROL 10ML LL (SYRINGE) ×2 IMPLANT
TOWEL GREEN STERILE (TOWEL DISPOSABLE) ×3 IMPLANT
TOWEL GREEN STERILE FF (TOWEL DISPOSABLE) ×3 IMPLANT
TRAY LAPAROSCOPIC MC (CUSTOM PROCEDURE TRAY) ×3 IMPLANT
TROCAR XCEL BLUNT TIP 100MML (ENDOMECHANICALS) ×3 IMPLANT
TROCAR XCEL NON-BLD 5MMX100MML (ENDOMECHANICALS) ×3 IMPLANT
TUBE CONNECTING 12'X1/4 (SUCTIONS) ×4
TUBE CONNECTING 12X1/4 (SUCTIONS) ×4 IMPLANT
WATER STERILE IRR 1000ML POUR (IV SOLUTION) ×3 IMPLANT

## 2019-07-14 NOTE — Plan of Care (Signed)
  Problem: Education: Goal: Knowledge of General Education information will improve Description: Including pain rating scale, medication(s)/side effects and non-pharmacologic comfort measures Outcome: Progressing   Problem: Activity: Goal: Risk for activity intolerance will decrease Outcome: Progressing   

## 2019-07-14 NOTE — Progress Notes (Signed)
Patient ID: Shirley White, female   DOB: 11-10-1970, 49 y.o.   MRN: [id number] Pre Procedure note for inpatients:   Shirley White has been scheduled for LAPAROSCOPIC APPENDECTOMY today. The various methods of treatment have been discussed with the patient. After consideration of the risks, benefits and treatment options the patient has consented to the planned procedure. I discussed the risks which include but are not limited to bleeding, infection, injury to surrounding structures, the need to convert to an open procedure, cardiopulmonary issues, etc.  The patient has been seen and labs reviewed. There are no changes in the patient's condition to prevent proceeding with the planned procedure today.  Recent labs:  Lab Results  Component Value Date   WBC 9.9 07/13/2019   HGB 7.7 (L) 07/13/2019   HCT 27.3 (L) 07/13/2019   PLT 520 (H) 07/13/2019   GLUCOSE 88 07/13/2019   CHOL 143 08/21/2015   TRIG 62 08/21/2015   HDL 61 08/21/2015   LDLCALC 70 08/21/2015   ALT 10 07/13/2019   AST 13 (L) 07/13/2019   NA 135 07/13/2019   K 3.7 07/13/2019   CL 102 07/13/2019   CREATININE 0.52 07/13/2019   BUN 6 07/13/2019   CO2 21 (L) 07/13/2019   INR 1.09 07/14/2017    Coralie Keens, MD 07/14/2019 7:30 AM

## 2019-07-14 NOTE — Anesthesia Postprocedure Evaluation (Signed)
Anesthesia Post Note  Patient: Shirley White  Procedure(s) Performed: LAPAROSCOPIC APPENDECTOMY CONVERTED TO OPEN HEMICOLECTOMY (N/A Abdomen)     Patient location during evaluation: PACU Anesthesia Type: General Level of consciousness: awake and alert, oriented and patient cooperative Pain management: pain level controlled Vital Signs Assessment: post-procedure vital signs reviewed and stable Respiratory status: spontaneous breathing, nonlabored ventilation and respiratory function stable Cardiovascular status: blood pressure returned to baseline and stable Postop Assessment: no apparent nausea or vomiting Anesthetic complications: no    Last Vitals:  Vitals:   07/14/19 1555 07/14/19 1610  BP: (!) 186/90 (!) 183/90  Pulse: 91 88  Resp: (!) 25 18  Temp:  36.4 C  SpO2: 100% 99%    Last Pain:  Vitals:   07/14/19 1610  TempSrc:   PainSc: Asleep                 Chalonda Schlatter,E. Sharin Altidor

## 2019-07-14 NOTE — Transfer of Care (Signed)
Immediate Anesthesia Transfer of Care Note  Patient: Shirley White  Procedure(s) Performed: LAPAROSCOPIC APPENDECTOMY CONVERTED TO OPEN HEMICOLECTOMY (N/A Abdomen)  Patient Location: PACU  Anesthesia Type:General  Level of Consciousness: drowsy and patient cooperative  Airway & Oxygen Therapy: Patient Spontanous Breathing and Patient connected to face mask oxygen  Post-op Assessment: Report given to RN and Post -op Vital signs reviewed and stable  Post vital signs: Reviewed and stable  Last Vitals:  Vitals Value Taken Time  BP    Temp    Pulse    Resp    SpO2      Last Pain:  Vitals:   07/14/19 1540  TempSrc:   PainSc: (P) Asleep      Patients Stated Pain Goal: 2 ([id number])  Complications: No apparent anesthesia complications

## 2019-07-14 NOTE — Anesthesia Procedure Notes (Signed)
Procedure Name: Intubation Date/Time: 07/14/2019 1:50 PM Performed by: Renato Shin, CRNA Pre-anesthesia Checklist: Patient identified, Emergency Drugs available, Suction available and Patient being monitored Patient Re-evaluated:Patient Re-evaluated prior to induction Oxygen Delivery Method: Circle system utilized Preoxygenation: Pre-oxygenation with 100% oxygen Induction Type: IV induction, Rapid sequence and Cricoid Pressure applied Laryngoscope Size: Miller and 2 Grade View: Grade I Tube type: Oral Tube size: 7.0 mm Number of attempts: 1 Airway Equipment and Method: Stylet and Oral airway Placement Confirmation: ETT inserted through vocal cords under direct vision,  positive ETCO2 and breath sounds checked- equal and bilateral Secured at: 20 cm Tube secured with: Tape Dental Injury: Teeth and Oropharynx as per pre-operative assessment

## 2019-07-14 NOTE — Op Note (Signed)
Shirley White 07/13/2019 - 07/14/2019   Pre-op Diagnosis: appendicitis     Post-op Diagnosis: mass of the appendix  Procedure(s): LAPAROSCOPIC CONVERTED TO OPEN HEMICOLECTOMY  Surgeon(s): Coralie Keens, MD  Anesthesia: General  Staff:  Circulator: Gar Ponto, RN; Small, Benjaman Lobe, RN Scrub Person: Marliss Czar, RN Float Surgical Tech: Lovett Sox, CST  Estimated Blood Loss: Minimal               Specimens: sent to path  Indications: This is a 49 year old female who presented with right lower quadrant abdominal pain.  She is HIV positive.  She underwent a CT scan showing a large fibroid uterus as well as dilation of the appendix and inflammation consistent with possible acute appendicitis versus mucocele of the appendix.  The decision was made to proceed to the operating room for a laparoscopic appendectomy.  She has chronic anemia and was transfused packed red blood cells preoperatively.  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  She had several enlarged mesenteric lymph nodes.  She had a large fibroid uterus as well.  Procedure: The patient was brought to the operating room identifies correct patient.  She is placed upon the operating table general anesthesia was induced.  Her abdomen was prepped and draped in the usual sterile fashion.  I made a small vertical incision below the umbilicus with a scalpel.  I carried this down to the fascia which was then opened the scalpel.  Hemostat was then used to pass the peritoneal cavity under direct vision.  A 0 Vicryl pursing suture was placed around the fascial opening.  The [facility] port was placed through the opening and insufflation of the abdomen was begun.  I placed a 5 mm trocar in the patient's right upper quadrant and another in the left lower quadrant under direct vision.  The patient was found to have some free fluid in the abdomen as well as a large  fibroid uterus.  Identified the appendix.  There was a hard mass involving the appendix near the base.  This was right adjacent to the terminal ileum.  There was omentum stuck to this.  I was able to free the omentum off of the appendix with the harmonic scalpel.  After examining the appendix circumferentially, I cannot easily identify the base secondary to this large mass.  I mobilized the right colon slightly with the harmonic scalpel.  At this point, I was worried that this was a malignancy and so the decision was made to proceed with conversion to an open procedure and a right hemicolectomy.  All ports were removed and the patient's abdomen was deflated.  I created a midline incision using the incision just below the umbilicus and taking it to above the umbilicus with a scalpel.  I then opened the fascia and peritoneum the entire length of the incision.  I was then able to eviscerate the appendix and distal ileum and cecum.  Again this appeared like a malignancy and was quite hard and firm.  I mobilized the right colon along the white line of Toldt and took down the hepatic flexure.  There were multiple large hard lymph nodes in the mesentery.  I transected the proximal transverse colon with a GIA-75 stapler.  I then transected the distal ileum with a GIA 75 stapler as well.  I then took down the mesentery with the LigaSure as well as 2-0 silk sutures and 2-0 silk  ties.  The specimen which included the entire right colon mesentery appendix and terminal ileum were then sent to pathology for evaluation.  Again, this appeared consistent with malignancy.  I explored the abdomen and found no other gross metastatic disease.  Again she had a large fibroid uterus but I could identify both ovaries which appeared normal.  I reapproximated the small bowel to the remaining transverse colon in a side-to-side fashion with silk sutures.  I then created an enterotomy and colotomy and performed a side-to-side anastomosis with a  GIA-75 stapler.  The open end was then closed with a TX 60 stapler.  I reinforced staple line with interrupted silk sutures.  I then closed the mesenteric defect with interrupted silk sutures as well.  The anastomosis appeared pink and well perfused and widely patent.  I then copiously irrigated the abdomen with several liters normal saline.  Hemostasis appeared to be achieved.  We next changed our gowns, gloves, and placed new sterile drapes.  I then closed the patient's midline fascia with a running #1 PDS suture.  I then anesthetized the fascia with Marcaine.  I irrigated with saline.  I then closed the midline incision with staples and the trocar sites with staples as well.  Honeycomb dressing was placed to the midline.  The patient tolerated procedure well.  All the counts were correct at the end of the procedure.  The patient was then extubated in the operating room and taken in stable condition to the recovery room.          Coralie Keens   Date: 07/14/2019  Time: 3:17 PM

## 2019-07-14 NOTE — Anesthesia Preprocedure Evaluation (Signed)
Anesthesia Evaluation  Patient identified by MRN, date of birth, ID band Patient awake    Reviewed: Allergy & Precautions, Patient's Chart, lab work & pertinent test results  Airway Mallampati: II  TM Distance: >3 FB     Dental   Pulmonary    breath sounds clear to auscultation       Cardiovascular  Rhythm:Regular Rate:Normal     Neuro/Psych    GI/Hepatic Neg liver ROS, GERD  ,  Endo/Other  negative endocrine ROS  Renal/GU negative Renal ROS     Musculoskeletal   Abdominal   Peds  Hematology  (+) anemia ,   Anesthesia Other Findings   Reproductive/Obstetrics                             Anesthesia Physical Anesthesia Plan  ASA: II  Anesthesia Plan: General   Post-op Pain Management:    Induction: Intravenous  PONV Risk Score and Plan: 3 and Ondansetron, Dexamethasone and Midazolam  Airway Management Planned: Oral ETT  Additional Equipment:   Intra-op Plan:   Post-operative Plan:   Informed Consent: I have reviewed the patients History and Physical, chart, labs and discussed the procedure including the risks, benefits and alternatives for the proposed anesthesia with the patient or authorized representative who has indicated his/her understanding and acceptance.     Dental advisory given  Plan Discussed with: CRNA, Anesthesiologist and Surgeon  Anesthesia Plan Comments:         Anesthesia Quick Evaluation

## 2019-07-15 ENCOUNTER — Encounter: Payer: Self-pay | Admitting: *Deleted

## 2019-07-15 LAB — BASIC METABOLIC PANEL
Anion gap: 15 (ref 5–15)
BUN: <5 mg/dL — ABNORMAL LOW (ref 6–20)
CO2: 18 mmol/L — ABNORMAL LOW (ref 22–32)
Calcium: 8.8 mg/dL — ABNORMAL LOW (ref 8.9–10.3)
Chloride: 106 mmol/L (ref 98–111)
Creatinine, Ser: 0.52 mg/dL (ref 0.44–1.00)
GFR calc Af Amer: >60 mL/min (ref >60)
GFR calc non Af Amer: >60 mL/min (ref >60)
Glucose, Bld: 109 mg/dL — ABNORMAL HIGH (ref 70–99)
Potassium: 3.8 mmol/L (ref 3.5–5.1)
Sodium: 139 mmol/L (ref 135–145)

## 2019-07-15 LAB — CBC
HCT: 32.8 % — ABNORMAL LOW (ref 36.0–46.0)
Hemoglobin: 10.2 g/dL — ABNORMAL LOW (ref 12.0–15.0)
MCH: 22 pg — ABNORMAL LOW (ref 26.0–34.0)
MCHC: 31.1 g/dL (ref 30.0–36.0)
MCV: 70.7 fL — ABNORMAL LOW (ref 80.0–100.0)
Platelets: 420 10*3/uL — ABNORMAL HIGH (ref 150–400)
RBC: 4.64 MIL/uL (ref 3.87–5.11)
RDW: 28.1 % — ABNORMAL HIGH (ref 11.5–15.5)
WBC: 18.3 10*3/uL — ABNORMAL HIGH (ref 4.0–10.5)
nRBC: 0 % (ref 0.0–0.2)

## 2019-07-15 LAB — BPAM RBC
Blood Product Expiration Date: 202105012359
Blood Product Expiration Date: 202105012359
ISSUE DATE / TIME: 202104011109
ISSUE DATE / TIME: 202104011311
Unit Type and Rh: 7300
Unit Type and Rh: 7300

## 2019-07-15 LAB — TYPE AND SCREEN
ABO/RH(D): B POS
Antibody Screen: NEGATIVE
Unit division: 0
Unit division: 0

## 2019-07-15 MED ORDER — DOCUSATE SODIUM 100 MG PO CAPS
100.0000 mg | ORAL_CAPSULE | Freq: Two times a day (BID) | ORAL | Status: DC
  Administered 2019-07-16 – 2019-07-20 (×7): 100 mg via ORAL
  Filled 2019-07-15 (×9): qty 1

## 2019-07-15 MED ORDER — PROMETHAZINE HCL 25 MG/ML IJ SOLN
25.0000 mg | Freq: Four times a day (QID) | INTRAMUSCULAR | Status: DC | PRN
  Administered 2019-07-15 – 2019-07-16 (×3): 25 mg via INTRAVENOUS
  Filled 2019-07-15 (×3): qty 1

## 2019-07-15 NOTE — Plan of Care (Signed)
  Problem: Clinical Measurements: Goal: Respiratory complications will improve Outcome: Progressing Goal: Cardiovascular complication will be avoided Outcome: Progressing   Problem: Skin Integrity: Goal: Risk for impaired skin integrity will decrease Outcome: Progressing   

## 2019-07-15 NOTE — Progress Notes (Signed)
[facility] Progress Note  1 Day Post-Op  Subjective: CC-  Friends at bedside. Abdomen sore but overall doing well. Pain medication helps. Passing small amount of flatus, no BM. Mild intermittent nausea, no emesis. She did get OOB to chair last night after surgery. Foley just taken out this morning, has not yet urinated.  Objective: Vital signs in last 24 hours: Temp:  [97.6 F (36.4 C)-98.7 F (37.1 C)] 98.7 F (37.1 C) (04/02 0811) Pulse Rate:  [74-103] 103 (04/02 0811) Resp:  [12-26] 17 (04/02 0811) BP: (128-197)/(64-97) 145/82 (04/02 0811) SpO2:  [90 %-100 %] 90 % (04/02 0811) Weight:  [50.3 kg] 50.3 kg (04/01 1258) Last BM Date: 07/13/19  Intake/Output from previous day: 04/01 0701 - 04/02 0700 In: 4103.7 [I.V.:1978.7; BH:[id number]; IV Piggyback:650] Out: 2100 [Urine:2050; Blood:50] Intake/Output this shift: No intake/output data recorded.  PE: Gen:  Alert, NAD, pleasant HEENT: EOM's intact, pupils equal and round Card:  RRR Pulm:  CTAB, no W/R/R, rate and effort normal Abd: Soft, mild distension, few BS heard, appropriately tender, midline incision with staples intact and appears cdi through honeycomb dressing/ some dried blood noted on dressing  Lab Results:  Recent Labs    07/14/19 0612 07/15/19 0549  WBC 9.1 18.3*  HGB 6.9* 10.2*  HCT 24.1* 32.8*  PLT 475* 420*   BMET Recent Labs    07/14/19 0612 07/15/19 0549  NA 137 139  K 3.8 3.8  CL 105 106  CO2 21* 18*  GLUCOSE 79 109*  BUN <5* <5*  CREATININE 0.51 0.52  CALCIUM 8.5* 8.8*   PT/INR No results for input(s): LABPROT, INR in the last 72 hours. CMP     Component Value Date/Time   NA 139 07/15/2019 0549   K 3.8 07/15/2019 0549   CL 106 07/15/2019 0549   CO2 18 (L) 07/15/2019 0549   GLUCOSE 109 (H) 07/15/2019 0549   BUN <5 (L) 07/15/2019 0549   CREATININE 0.52 07/15/2019 0549   CREATININE 0.51 10/18/2018 0929   CALCIUM 8.8 (L) 07/15/2019 0549   PROT 7.9 07/13/2019 1245    ALBUMIN 3.4 (L) 07/13/2019 1245   AST 13 (L) 07/13/2019 1245   AST 13 (L) 09/23/2018 1000   ALT 10 07/13/2019 1245   ALT 13 09/23/2018 1000   ALKPHOS 63 07/13/2019 1245   BILITOT 0.8 07/13/2019 1245   BILITOT 0.6 09/23/2018 1000   GFRNONAA >60 07/15/2019 0549   GFRNONAA 115 10/18/2018 0929   GFRAA >60 07/15/2019 0549   GFRAA 134 10/18/2018 0929   Lipase     Component Value Date/Time   LIPASE 21 07/13/2019 1245       Studies/Results: CT ABDOMEN PELVIS W CONTRAST  Result Date: 07/13/2019 CLINICAL DATA:  Right lower quadrant pain EXAM: CT ABDOMEN AND PELVIS WITH CONTRAST TECHNIQUE: Multidetector CT imaging of the abdomen and pelvis was performed using the standard protocol following bolus administration of intravenous contrast. CONTRAST:  173mL OMNIPAQUE IOHEXOL 300 MG/ML  SOLN COMPARISON:  CT 11/02/2018 FINDINGS: Lower chest: Lung bases demonstrate no acute consolidation or effusion. The heart size is within normal limits. Hepatobiliary: No focal liver abnormality is seen. No gallstones, gallbladder wall thickening, or biliary dilatation. Pancreas: Unremarkable. No pancreatic ductal dilatation or surrounding inflammatory changes. Spleen: Normal in size without focal abnormality. Adrenals/Urinary Tract: Adrenal glands are normal. Kidneys show no hydronephrosis. Cyst in the midpole of the left kidney. Slightly thick-walled appearance of the urinary bladder Stomach/Bowel: The stomach is nonenlarged. No dilated small bowel. Abnormal appendix. Dilatation  of the tip up to 15 mm with prominent mucosal enhancement. Inflammatory masslike thickening of the more proximal appendix extending to the base and cecum. No calcified stones. Vascular/Lymphatic: Nonaneurysmal aorta. Subcentimeter right lower quadrant mesenteric nodes. Reproductive: Enlarged uterus with multiple masses presumably fibroids. 2.1 cm rim enhancing slightly dense left adnexal lesion. Other: No free air. Small amount of fluid in the  right lower quadrant. Musculoskeletal: No acute or suspicious osseous abnormality. IMPRESSION: 1. Abnormal appendix. Diffuse inflammatory masslike thickening of the appendix with dilatation of the tip that demonstrates prominent mucosal enhancement and possible intraluminal mucinous type secretions. Appearance of the appendix is not typical for routine appendicitis, therefore also consider obstructing inflammatory appendiceal mass lesion such as mucocele or other appendix mass, resulting in acute appendicitis. No free air to suggest perforation. 2. Enlarged uterus with multiple fibroids 3. Thick-walled bladder either due to under distension or cystitis. Electronically Signed   By: Donavan Foil M.D.   On: 07/13/2019 20:10    Anti-infectives: Anti-infectives (From admission, onward)   Start     Dose/Rate Route Frequency Ordered Stop   07/14/19 2100  cefTRIAXone (ROCEPHIN) 2 g in sodium chloride 0.9 % 100 mL IVPB     2 g 200 mL/hr over 30 Minutes Intravenous Every 24 hours 07/13/19 2053     07/14/19 2100  metroNIDAZOLE (FLAGYL) IVPB 500 mg     500 mg 100 mL/hr over 60 Minutes Intravenous Every 8 hours 07/13/19 2053     07/13/19 2100  bictegravir-emtricitabine-tenofovir AF (BIKTARVY) 50-200-25 MG per tablet 1 tablet     1 tablet Oral Daily 07/13/19 2053     07/13/19 2030  cefTRIAXone (ROCEPHIN) 2 g in sodium chloride 0.9 % 100 mL IVPB     2 g 200 mL/hr over 30 Minutes Intravenous  Once 07/13/19 2021 07/13/19 2112   07/13/19 2030  metroNIDAZOLE (FLAGYL) IVPB 500 mg     500 mg 100 mL/hr over 60 Minutes Intravenous  Once 07/13/19 2021 07/13/19 2132       Assessment/Plan HIV - biktarvy ABL anemia - hgb 10.2 today from 6.9 after PRBC transfusion, monitor  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 Ninfa Linden - POD#1 - surgical pathology pending - suspect will need oncology consult once official path results are back - Passing small amount of flatus but having  intermittent nausea. Continue just ice chips and await return in bowel function. Mobilize. Pain seems fairly well controlled on current regimen.  ID - rocephin/flagyl 3/31>> FEN - IVF, ice chips VTE - SCDs, lovenox Foley - d/c 4/2 Follow up - Dr. Ninfa Linden   LOS: 0 days    Wellington Hampshire, [facility] 07/15/2019, 9:17 AM Please see Amion for pager number during day hours 7:00am-4:30pm

## 2019-07-16 LAB — BASIC METABOLIC PANEL
Anion gap: 14 (ref 5–15)
BUN: 5 mg/dL — ABNORMAL LOW (ref 6–20)
CO2: 18 mmol/L — ABNORMAL LOW (ref 22–32)
Calcium: 8.7 mg/dL — ABNORMAL LOW (ref 8.9–10.3)
Chloride: 111 mmol/L (ref 98–111)
Creatinine, Ser: 0.72 mg/dL (ref 0.44–1.00)
GFR calc Af Amer: >60 mL/min (ref >60)
GFR calc non Af Amer: >60 mL/min (ref >60)
Glucose, Bld: 112 mg/dL — ABNORMAL HIGH (ref 70–99)
Potassium: 3.6 mmol/L (ref 3.5–5.1)
Sodium: 143 mmol/L (ref 135–145)

## 2019-07-16 LAB — CBC
HCT: 34.3 % — ABNORMAL LOW (ref 36.0–46.0)
Hemoglobin: 10.6 g/dL — ABNORMAL LOW (ref 12.0–15.0)
MCH: 21.7 pg — ABNORMAL LOW (ref 26.0–34.0)
MCHC: 30.9 g/dL (ref 30.0–36.0)
MCV: 70.3 fL — ABNORMAL LOW (ref 80.0–100.0)
Platelets: 450 10*3/uL — ABNORMAL HIGH (ref 150–400)
RBC: 4.88 MIL/uL (ref 3.87–5.11)
RDW: 28.7 % — ABNORMAL HIGH (ref 11.5–15.5)
WBC: 17.4 10*3/uL — ABNORMAL HIGH (ref 4.0–10.5)
nRBC: 0.2 % (ref 0.0–0.2)

## 2019-07-16 LAB — MAGNESIUM: Magnesium: 1.7 mg/dL (ref 1.7–2.4)

## 2019-07-16 MED ORDER — AMLODIPINE BESYLATE 10 MG PO TABS
10.0000 mg | ORAL_TABLET | Freq: Every day | ORAL | Status: DC
  Administered 2019-07-16 – 2019-07-20 (×5): 10 mg via ORAL
  Filled 2019-07-16 (×5): qty 1

## 2019-07-16 MED ORDER — METHOCARBAMOL 1000 MG/10ML IJ SOLN
500.0000 mg | Freq: Four times a day (QID) | INTRAVENOUS | Status: DC
  Administered 2019-07-16 – 2019-07-19 (×12): 500 mg via INTRAVENOUS
  Filled 2019-07-16 (×3): qty 5
  Filled 2019-07-16: qty 500
  Filled 2019-07-16 (×11): qty 5

## 2019-07-16 MED ORDER — LABETALOL HCL 5 MG/ML IV SOLN
10.0000 mg | INTRAVENOUS | Status: DC | PRN
  Administered 2019-07-17: 10 mg via INTRAVENOUS
  Filled 2019-07-16: qty 4

## 2019-07-16 MED ORDER — METOPROLOL TARTRATE 5 MG/5ML IV SOLN
5.0000 mg | Freq: Four times a day (QID) | INTRAVENOUS | Status: DC | PRN
  Administered 2019-07-16 – 2019-07-17 (×3): 5 mg via INTRAVENOUS
  Filled 2019-07-16 (×3): qty 5

## 2019-07-16 NOTE — Progress Notes (Signed)
[facility] Progress Note  2 Days Post-Op  Subjective: CC-  Having a lot of abdominal pain. Taking mostly IV dilaudid which helps but wears off. Declined scheduled tylenol. Some nausea yesterday and she reports spitting up some phlegm but no real emesis. No flatus or BM. Feels bloated. Only got OOB to ambulate to the restroom.  Objective: Vital signs in last 24 hours: Temp:  [98.8 F (37.1 C)-99.8 F (37.7 C)] 99 F (37.2 C) (04/03 0747) Pulse Rate:  [90-108] 108 (04/03 0747) Resp:  [18] 18 (04/03 0747) BP: (153-179)/(86-112) 179/112 (04/03 0747) SpO2:  [90 %-94 %] 94 % (04/03 0748) Last BM Date: 07/13/19  Intake/Output from previous day: 04/02 0701 - 04/03 0700 In: 1390.4 [I.V.:721.3; IV Piggyback:669.1] Out: -  Intake/Output this shift: No intake/output data recorded.  PE: Gen:  Alert, NAD, pleasant HEENT: EOM's intact, pupils equal and round Card:  RRR Pulm:  CTAB, no W/R/R, rate and effort normal Abd: Soft, distended, +BS heard, appropriately tender, midline incision with staples intact and appears cdi through honeycomb dressing/ some dried blood noted on dressing  Lab Results:  Recent Labs    07/15/19 0549 07/16/19 0331  WBC 18.3* 17.4*  HGB 10.2* 10.6*  HCT 32.8* 34.3*  PLT 420* 450*   BMET Recent Labs    07/15/19 0549 07/16/19 0331  NA 139 143  K 3.8 3.6  CL 106 111  CO2 18* 18*  GLUCOSE 109* 112*  BUN <5* 5*  CREATININE 0.52 0.72  CALCIUM 8.8* 8.7*   PT/INR No results for input(s): LABPROT, INR in the last 72 hours. CMP     Component Value Date/Time   NA 143 07/16/2019 0331   K 3.6 07/16/2019 0331   CL 111 07/16/2019 0331   CO2 18 (L) 07/16/2019 0331   GLUCOSE 112 (H) 07/16/2019 0331   BUN 5 (L) 07/16/2019 0331   CREATININE 0.72 07/16/2019 0331   CREATININE 0.51 10/18/2018 0929   CALCIUM 8.7 (L) 07/16/2019 0331   PROT 7.9 07/13/2019 1245   ALBUMIN 3.4 (L) 07/13/2019 1245   AST 13 (L) 07/13/2019 1245   AST 13 (L)  09/23/2018 1000   ALT 10 07/13/2019 1245   ALT 13 09/23/2018 1000   ALKPHOS 63 07/13/2019 1245   BILITOT 0.8 07/13/2019 1245   BILITOT 0.6 09/23/2018 1000   GFRNONAA >60 07/16/2019 0331   GFRNONAA 115 10/18/2018 0929   GFRAA >60 07/16/2019 0331   GFRAA 134 10/18/2018 0929   Lipase     Component Value Date/Time   LIPASE 21 07/13/2019 1245       Studies/Results: No results found.  Anti-infectives: Anti-infectives (From admission, onward)   Start     Dose/Rate Route Frequency Ordered Stop   07/14/19 2100  cefTRIAXone (ROCEPHIN) 2 g in sodium chloride 0.9 % 100 mL IVPB     2 g 200 mL/hr over 30 Minutes Intravenous Every 24 hours 07/13/19 2053     07/14/19 2100  metroNIDAZOLE (FLAGYL) IVPB 500 mg     500 mg 100 mL/hr over 60 Minutes Intravenous Every 8 hours 07/13/19 2053     07/13/19 2100  bictegravir-emtricitabine-tenofovir AF (BIKTARVY) 50-200-25 MG per tablet 1 tablet     1 tablet Oral Daily 07/13/19 2053     07/13/19 2030  cefTRIAXone (ROCEPHIN) 2 g in sodium chloride 0.9 % 100 mL IVPB     2 g 200 mL/hr over 30 Minutes Intravenous  Once 07/13/19 2021 07/13/19 2112   07/13/19 2030  metroNIDAZOLE (FLAGYL)  IVPB 500 mg     500 mg 100 mL/hr over 60 Minutes Intravenous  Once 07/13/19 2021 07/13/19 2132       Assessment/Plan HIV - biktarvy ABL anemia - hgb 10.6, stable.  Elevated BP - not on HTN medications at baseline, may be somewhat due to pain. 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 Ninfa Linden - POD#2 - surgical pathology pending - suspect will need oncology consult once official path results are back - A little more distended today. Continue just ice chips and await return in bowel function. Mobilize more today. Add scheduled IV robaxin and discussed reasoning for taking scheduled tylenol. Repeat labs in AM.  ID - rocephin/flagyl 3/31>> FEN - IVF, ice chips VTE - SCDs, lovenox Foley - d/c  4/2 Follow up - Dr. Ninfa Linden   LOS: 1 day    Wellington Hampshire, [facility] 07/16/2019, 9:42 AM Please see Amion for pager number during day hours 7:00am-4:30pm

## 2019-07-16 NOTE — Plan of Care (Addendum)
Notified Margie Billet, [place] via [employer] manager at [phone] that the pt's BP has remained high, with latest BP being 182/102. First dose of metoprolol was given around 0930. Gave second dose around 1630. Pt not exhibiting any symptoms, will continue to monitor.  Problem: Education: Goal: Knowledge of General Education information will improve Description: Including pain rating scale, medication(s)/side effects and non-pharmacologic comfort measures Outcome: Progressing   Problem: Health Behavior/Discharge Planning: Goal: Ability to manage health-related needs will improve Outcome: Progressing   Problem: Clinical Measurements: Goal: Will remain free from infection Outcome: Progressing Goal: Cardiovascular complication will be avoided Outcome: Progressing   Problem: Nutrition: Goal: Adequate nutrition will be maintained Outcome: Progressing   Problem: Elimination: Goal: Will not experience complications related to bowel motility Outcome: Progressing   Problem: Pain Managment: Goal: General experience of comfort will improve Outcome: Progressing   Problem: Safety: Goal: Ability to remain free from injury will improve Outcome: Progressing   Problem: Skin Integrity: Goal: Risk for impaired skin integrity will decrease Outcome: Progressing

## 2019-07-17 LAB — MAGNESIUM: Magnesium: 1.7 mg/dL (ref 1.7–2.4)

## 2019-07-17 LAB — BASIC METABOLIC PANEL
Anion gap: 12 (ref 5–15)
BUN: 8 mg/dL (ref 6–20)
CO2: 19 mmol/L — ABNORMAL LOW (ref 22–32)
Calcium: 8.2 mg/dL — ABNORMAL LOW (ref 8.9–10.3)
Chloride: 115 mmol/L — ABNORMAL HIGH (ref 98–111)
Creatinine, Ser: 0.6 mg/dL (ref 0.44–1.00)
GFR calc Af Amer: >60 mL/min (ref >60)
GFR calc non Af Amer: >60 mL/min (ref >60)
Glucose, Bld: 107 mg/dL — ABNORMAL HIGH (ref 70–99)
Potassium: 2.9 mmol/L — ABNORMAL LOW (ref 3.5–5.1)
Sodium: 146 mmol/L — ABNORMAL HIGH (ref 135–145)

## 2019-07-17 LAB — CBC
HCT: 32.5 % — ABNORMAL LOW (ref 36.0–46.0)
Hemoglobin: 10 g/dL — ABNORMAL LOW (ref 12.0–15.0)
MCH: 21.5 pg — ABNORMAL LOW (ref 26.0–34.0)
MCHC: 30.8 g/dL (ref 30.0–36.0)
MCV: 69.7 fL — ABNORMAL LOW (ref 80.0–100.0)
Platelets: 453 10*3/uL — ABNORMAL HIGH (ref 150–400)
RBC: 4.66 MIL/uL (ref 3.87–5.11)
RDW: 29.5 % — ABNORMAL HIGH (ref 11.5–15.5)
WBC: 15.3 10*3/uL — ABNORMAL HIGH (ref 4.0–10.5)
nRBC: 0.3 % — ABNORMAL HIGH (ref 0.0–0.2)

## 2019-07-17 MED ORDER — POTASSIUM CHLORIDE 2 MEQ/ML IV SOLN
INTRAVENOUS | Status: DC
  Filled 2019-07-17 (×2): qty 1000

## 2019-07-17 MED ORDER — POTASSIUM CHLORIDE 2 MEQ/ML IV SOLN: INTRAVENOUS | Status: DC

## 2019-07-17 MED ORDER — MAGNESIUM SULFATE 50 % IJ SOLN: 1.0000 g | Freq: Once | INTRAMUSCULAR | Status: DC

## 2019-07-17 MED ORDER — POTASSIUM CHLORIDE 10 MEQ/100ML IV SOLN
10.0000 meq | INTRAVENOUS | Status: AC
  Administered 2019-07-17 (×4): 10 meq via INTRAVENOUS
  Filled 2019-07-17 (×4): qty 100

## 2019-07-17 MED ORDER — MAGNESIUM SULFATE IN D5W 1-5 GM/100ML-% IV SOLN
1.0000 g | Freq: Once | INTRAVENOUS | Status: AC
  Administered 2019-07-17: 1 g via INTRAVENOUS
  Filled 2019-07-17: qty 100

## 2019-07-17 MED ORDER — KCL IN DEXTROSE-NACL 20-5-0.45 MEQ/L-%-% IV SOLN
INTRAVENOUS | Status: DC
  Filled 2019-07-17 (×2): qty 1000

## 2019-07-17 MED ORDER — LISINOPRIL 5 MG PO TABS
5.0000 mg | ORAL_TABLET | Freq: Every day | ORAL | Status: DC
  Administered 2019-07-17 – 2019-07-20 (×4): 5 mg via ORAL
  Filled 2019-07-17 (×4): qty 1

## 2019-07-17 NOTE — Discharge Instructions (Signed)
[facility]      [facility] [phone]  OPEN ABDOMINAL SURGERY: POST OP INSTRUCTIONS  Always review your discharge instruction sheet given to you by the facility where your surgery was performed.  IF YOU HAVE DISABILITY OR FAMILY LEAVE FORMS, YOU MUST BRING THEM TO THE OFFICE FOR PROCESSING.  PLEASE DO NOT GIVE THEM TO YOUR DOCTOR.  1. A prescription for pain medication may be given to you upon discharge.  Take your pain medication as prescribed, if needed.  If narcotic pain medicine is not needed, then you may take acetaminophen (Tylenol) or ibuprofen (Advil) as needed. 2. Take your usually prescribed medications unless otherwise directed. 3. If you need a refill on your pain medication, please contact your pharmacy. They will contact our office to request authorization.  Prescriptions will not be filled after 5pm or on week-ends. 4. You should follow a light diet the first few days after arrival home, such as soup and crackers, pudding, etc.unless your doctor has advised otherwise. A high-fiber, low fat diet can be resumed as tolerated.   Be sure to include lots of fluids daily. Most patients will experience some swelling and bruising on the chest and neck area.  Ice packs will help.  Swelling and bruising can take several days to resolve 5. Most patients will experience some swelling and bruising in the area of the incision. Ice pack will help. Swelling and bruising can take several days to resolve..  6. It is common to experience some constipation if taking pain medication after surgery.  Increasing fluid intake and taking a stool softener will usually help or prevent this problem from occurring.  A mild laxative (Milk of Magnesia or Miralax) should be taken according to package directions if there are no bowel movements after 48 hours. 7.  You may have steri-strips (small skin tapes) in place directly over the incision.  These strips should be left on the skin for 7-10 days.  If your  surgeon used skin glue on the incision, you may shower in 24 hours.  The glue will flake off over the next 2-3 weeks.  Any sutures or staples will be removed at the office during your follow-up visit. You may find that a light gauze bandage over your incision may keep your staples from being rubbed or pulled. You may shower and replace the bandage daily. 8. ACTIVITIES:  You may resume regular (light) daily activities beginning the next day--such as daily self-care, walking, climbing stairs--gradually increasing activities as tolerated.  You may have sexual intercourse when it is comfortable.  Refrain from any heavy lifting or straining until approved by your doctor. a. You may drive when you no longer are taking prescription pain medication, you can comfortably wear a seatbelt, and you can safely maneuver your car and apply brakes b. Return to Work: ___________________________________ 9. You should see your doctor in the office for a follow-up appointment approximately two weeks after your surgery.  Make sure that you call for this appointment within a day or two after you arrive home to insure a convenient appointment time. OTHER INSTRUCTIONS:  _____________________________________________________________ _____________________________________________________________  WHEN TO CALL YOUR DOCTOR: 1. Fever over 101.0 2. Inability to urinate 3. Nausea and/or vomiting 4. Extreme swelling or bruising 5. Continued bleeding from incision. 6. Increased pain, redness, or drainage from the incision. 7. Difficulty swallowing or breathing 8. Muscle cramping or spasms. 9. Numbness or tingling in hands or feet or around lips.  The clinic staff is available to   answer your questions during regular business hours.  Please don't hesitate to call and ask to speak to one of the nurses if you have concerns.  For further questions, please visit [email or web address]   

## 2019-07-17 NOTE — Plan of Care (Signed)
  Problem: Education: Goal: Knowledge of General Education information will improve Description: Including pain rating scale, medication(s)/side effects and non-pharmacologic comfort measures Outcome: Progressing   Problem: Health Behavior/Discharge Planning: Goal: Ability to manage health-related needs will improve Outcome: Progressing   Problem: Clinical Measurements: Goal: Will remain free from infection Outcome: Progressing   Problem: Nutrition: Goal: Adequate nutrition will be maintained Outcome: Progressing   Problem: Elimination: Goal: Will not experience complications related to bowel motility Outcome: Progressing   Problem: Pain Managment: Goal: General experience of comfort will improve Outcome: Progressing   Problem: Safety: Goal: Ability to remain free from injury will improve Outcome: Progressing   Problem: Skin Integrity: Goal: Risk for impaired skin integrity will decrease Outcome: Progressing

## 2019-07-17 NOTE — Progress Notes (Signed)
[facility] Progress Note  3 Days Post-Op  Subjective: CC-  Up in chair. Feeling better today. Started passing gas last night. Denies any n/v. Did not take any IV dilaudid yesterday. Ambulated multiple times.  Objective: Vital signs in last 24 hours: Temp:  [97.8 F (36.6 C)-98.9 F (37.2 C)] 98.9 F (37.2 C) (04/04 0818) Pulse Rate:  [98-114] 99 (04/04 0818) Resp:  [18] 18 (04/04 0818) BP: (142-191)/(90-114) 149/90 (04/04 0818) SpO2:  [90 %-98 %] 98 % (04/04 0818) Last BM Date: 07/13/19  Intake/Output from previous day: 04/03 0701 - 04/04 0700 In: 1002.4 [I.V.:821.4; IV Piggyback:180.9] Out: -  Intake/Output this shift: No intake/output data recorded.  PE: Gen: Alert, NAD, pleasant HEENT: EOM's intact, pupils equal and round Card: RRR Pulm: CTAB, no W/R/R, rate and effort normal Abd: Soft, mild distension,+BS,appropriately tender, midline incision cdi with staples intact and no erythema or drainage  Lab Results:  Recent Labs    07/16/19 0331 07/17/19 0350  WBC 17.4* 15.3*  HGB 10.6* 10.0*  HCT 34.3* 32.5*  PLT 450* 453*   BMET Recent Labs    07/16/19 0331 07/17/19 0350  NA 143 146*  K 3.6 2.9*  CL 111 115*  CO2 18* 19*  GLUCOSE 112* 107*  BUN 5* 8  CREATININE 0.72 0.60  CALCIUM 8.7* 8.2*   PT/INR No results for input(s): LABPROT, INR in the last 72 hours. CMP     Component Value Date/Time   NA 146 (H) 07/17/2019 0350   K 2.9 (L) 07/17/2019 0350   CL 115 (H) 07/17/2019 0350   CO2 19 (L) 07/17/2019 0350   GLUCOSE 107 (H) 07/17/2019 0350   BUN 8 07/17/2019 0350   CREATININE 0.60 07/17/2019 0350   CREATININE 0.51 10/18/2018 0929   CALCIUM 8.2 (L) 07/17/2019 0350   PROT 7.9 07/13/2019 1245   ALBUMIN 3.4 (L) 07/13/2019 1245   AST 13 (L) 07/13/2019 1245   AST 13 (L) 09/23/2018 1000   ALT 10 07/13/2019 1245   ALT 13 09/23/2018 1000   ALKPHOS 63 07/13/2019 1245   BILITOT 0.8 07/13/2019 1245   BILITOT 0.6 09/23/2018 1000    GFRNONAA >60 07/17/2019 0350   GFRNONAA 115 10/18/2018 0929   GFRAA >60 07/17/2019 0350   GFRAA 134 10/18/2018 0929   Lipase     Component Value Date/Time   LIPASE 21 07/13/2019 1245       Studies/Results: No results found.  Anti-infectives: Anti-infectives (From admission, onward)   Start     Dose/Rate Route Frequency Ordered Stop   07/14/19 2100  cefTRIAXone (ROCEPHIN) 2 g in sodium chloride 0.9 % 100 mL IVPB     2 g 200 mL/hr over 30 Minutes Intravenous Every 24 hours 07/13/19 2053     07/14/19 2100  metroNIDAZOLE (FLAGYL) IVPB 500 mg     500 mg 100 mL/hr over 60 Minutes Intravenous Every 8 hours 07/13/19 2053     07/13/19 2100  bictegravir-emtricitabine-tenofovir AF (BIKTARVY) 50-200-25 MG per tablet 1 tablet     1 tablet Oral Daily 07/13/19 2053     07/13/19 2030  cefTRIAXone (ROCEPHIN) 2 g in sodium chloride 0.9 % 100 mL IVPB     2 g 200 mL/hr over 30 Minutes Intravenous  Once 07/13/19 2021 07/13/19 2112   07/13/19 2030  metroNIDAZOLE (FLAGYL) IVPB 500 mg     500 mg 100 mL/hr over 60 Minutes Intravenous  Once 07/13/19 2021 07/13/19 2132       Assessment/Plan HIV - biktarvy  ABL anemia - hgb 10, stable.  Elevated BP - not on HTN medications at baseline. Started norvasc 10mg  on 4/3, will add lisinopril 5mg  qd today. IV labetolol PRN. 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 Ninfa Linden -POD#3 - surgical pathology pending - suspect will need oncology consult once official path results are back - passing flatus - Advance to clear liquids. Continue mobilizing. Replace K and Mag. Labs in AM.  ID -rocephin/flagyl 3/31>>day#4 FEN -IVF, CLD VTE -SCDs, lovenox Foley -d/c 4/2 Follow up -Dr. Ninfa Linden   LOS: 2 days    Wellington Hampshire, [facility] 07/17/2019, 9:04 AM Please see Amion for pager number during day hours 7:00am-4:30pm

## 2019-07-18 ENCOUNTER — Encounter (HOSPITAL_COMMUNITY): Payer: Self-pay | Admitting: Surgery

## 2019-07-18 LAB — BASIC METABOLIC PANEL
Anion gap: 11 (ref 5–15)
BUN: <5 mg/dL — ABNORMAL LOW (ref 6–20)
CO2: 20 mmol/L — ABNORMAL LOW (ref 22–32)
Calcium: 8.3 mg/dL — ABNORMAL LOW (ref 8.9–10.3)
Chloride: 110 mmol/L (ref 98–111)
Creatinine, Ser: 0.47 mg/dL (ref 0.44–1.00)
GFR calc Af Amer: >60 mL/min (ref >60)
GFR calc non Af Amer: >60 mL/min (ref >60)
Glucose, Bld: 154 mg/dL — ABNORMAL HIGH (ref 70–99)
Potassium: 3.3 mmol/L — ABNORMAL LOW (ref 3.5–5.1)
Sodium: 141 mmol/L (ref 135–145)

## 2019-07-18 LAB — CBC
HCT: 35 % — ABNORMAL LOW (ref 36.0–46.0)
Hemoglobin: 10.9 g/dL — ABNORMAL LOW (ref 12.0–15.0)
MCH: 21.7 pg — ABNORMAL LOW (ref 26.0–34.0)
MCHC: 31.1 g/dL (ref 30.0–36.0)
MCV: 69.7 fL — ABNORMAL LOW (ref 80.0–100.0)
Platelets: 495 10*3/uL — ABNORMAL HIGH (ref 150–400)
RBC: 5.02 MIL/uL (ref 3.87–5.11)
RDW: 29.8 % — ABNORMAL HIGH (ref 11.5–15.5)
WBC: 11.4 10*3/uL — ABNORMAL HIGH (ref 4.0–10.5)
nRBC: 0.2 % (ref 0.0–0.2)

## 2019-07-18 LAB — MAGNESIUM: Magnesium: 1.7 mg/dL (ref 1.7–2.4)

## 2019-07-18 MED ORDER — MAGNESIUM SULFATE 2 GM/50ML IV SOLN
2.0000 g | Freq: Once | INTRAVENOUS | Status: AC
  Administered 2019-07-18: 2 g via INTRAVENOUS
  Filled 2019-07-18: qty 50

## 2019-07-18 MED ORDER — POTASSIUM CHLORIDE 20 MEQ PO PACK
40.0000 meq | PACK | Freq: Once | ORAL | Status: AC
  Administered 2019-07-18: 40 meq via ORAL
  Filled 2019-07-18: qty 2

## 2019-07-18 MED ORDER — OXYCODONE HCL 5 MG PO TABS
5.0000 mg | ORAL_TABLET | ORAL | Status: DC | PRN
  Administered 2019-07-18 (×2): 5 mg via ORAL
  Filled 2019-07-18 (×2): qty 1

## 2019-07-18 MED ORDER — POTASSIUM CHLORIDE 10 MEQ/100ML IV SOLN
10.0000 meq | INTRAVENOUS | Status: AC
  Administered 2019-07-18 (×3): 10 meq via INTRAVENOUS
  Filled 2019-07-18 (×3): qty 100

## 2019-07-18 MED ORDER — POTASSIUM CHLORIDE 10 MEQ/100ML IV SOLN
10.0000 meq | INTRAVENOUS | Status: AC
  Administered 2019-07-18: 10 meq via INTRAVENOUS
  Filled 2019-07-18: qty 100

## 2019-07-18 MED ORDER — HYDROMORPHONE HCL 1 MG/ML IJ SOLN: 1.0000 mg | INTRAMUSCULAR | Status: DC | PRN

## 2019-07-18 MED ORDER — METOCLOPRAMIDE HCL 5 MG/ML IJ SOLN
10.0000 mg | Freq: Four times a day (QID) | INTRAMUSCULAR | Status: DC
  Administered 2019-07-18 – 2019-07-20 (×8): 10 mg via INTRAVENOUS
  Filled 2019-07-18 (×9): qty 2

## 2019-07-18 MED ORDER — GABAPENTIN 300 MG PO CAPS
300.0000 mg | ORAL_CAPSULE | Freq: Three times a day (TID) | ORAL | Status: DC
  Administered 2019-07-18 – 2019-07-20 (×7): 300 mg via ORAL
  Filled 2019-07-18 (×7): qty 1

## 2019-07-18 NOTE — Plan of Care (Signed)

## 2019-07-18 NOTE — Progress Notes (Addendum)
[facility] Progress Note  4 Days Post-Op  Subjective: CC-  Feeling ok. Reports flatus, no bowel movement. Still with some nausea. Walked halls yesterday.   Objective: Vital signs in last 24 hours: Temp:  [98 F (36.7 C)-98.5 F (36.9 C)] 98.5 F (36.9 C) (04/05 0740) Pulse Rate:  [69-109] 69 (04/05 0740) Resp:  [16-18] 17 (04/05 0740) BP: (139-174)/(84-103) 139/84 (04/05 0740) SpO2:  [97 %-98 %] 98 % (04/05 0740) Last BM Date: 07/13/19  Intake/Output from previous day: 04/04 0701 - 04/05 0700 In: 3720.5 [P.O.:600; I.V.:1467.3; IV Piggyback:1653.2] Out: -  Intake/Output this shift: No intake/output data recorded.  PE: Gen: Alert, NAD, pleasant HEENT: EOM's intact, pupils equal and round Card: RRR Pulm: CTAB, no W/R/R, rate and effort normal Abd: Soft, mild distension,+BS,appropriately tender, midline incision cdi with staples intact and no erythema or drainage  Lab Results:  Recent Labs    07/17/19 0350 07/18/19 0406  WBC 15.3* 11.4*  HGB 10.0* 10.9*  HCT 32.5* 35.0*  PLT 453* 495*   BMET Recent Labs    07/17/19 0350 07/18/19 0406  NA 146* 141  K 2.9* 3.3*  CL 115* 110  CO2 19* 20*  GLUCOSE 107* 154*  BUN 8 <5*  CREATININE 0.60 0.47  CALCIUM 8.2* 8.3*   PT/INR No results for input(s): LABPROT, INR in the last 72 hours. CMP     Component Value Date/Time   NA 141 07/18/2019 0406   K 3.3 (L) 07/18/2019 0406   CL 110 07/18/2019 0406   CO2 20 (L) 07/18/2019 0406   GLUCOSE 154 (H) 07/18/2019 0406   BUN <5 (L) 07/18/2019 0406   CREATININE 0.47 07/18/2019 0406   CREATININE 0.51 10/18/2018 0929   CALCIUM 8.3 (L) 07/18/2019 0406   PROT 7.9 07/13/2019 1245   ALBUMIN 3.4 (L) 07/13/2019 1245   AST 13 (L) 07/13/2019 1245   AST 13 (L) 09/23/2018 1000   ALT 10 07/13/2019 1245   ALT 13 09/23/2018 1000   ALKPHOS 63 07/13/2019 1245   BILITOT 0.8 07/13/2019 1245   BILITOT 0.6 09/23/2018 1000   GFRNONAA >60 07/18/2019 0406   GFRNONAA 115  10/18/2018 0929   GFRAA >60 07/18/2019 0406   GFRAA 134 10/18/2018 0929   Lipase     Component Value Date/Time   LIPASE 21 07/13/2019 1245       Studies/Results: No results found.  Anti-infectives: Anti-infectives (From admission, onward)   Start     Dose/Rate Route Frequency Ordered Stop   07/14/19 2100  cefTRIAXone (ROCEPHIN) 2 g in sodium chloride 0.9 % 100 mL IVPB     2 g 200 mL/hr over 30 Minutes Intravenous Every 24 hours 07/13/19 2053     07/14/19 2100  metroNIDAZOLE (FLAGYL) IVPB 500 mg     500 mg 100 mL/hr over 60 Minutes Intravenous Every 8 hours 07/13/19 2053     07/13/19 2100  bictegravir-emtricitabine-tenofovir AF (BIKTARVY) 50-200-25 MG per tablet 1 tablet     1 tablet Oral Daily 07/13/19 2053     07/13/19 2030  cefTRIAXone (ROCEPHIN) 2 g in sodium chloride 0.9 % 100 mL IVPB     2 g 200 mL/hr over 30 Minutes Intravenous  Once 07/13/19 2021 07/13/19 2112   07/13/19 2030  metroNIDAZOLE (FLAGYL) IVPB 500 mg     500 mg 100 mL/hr over 60 Minutes Intravenous  Once 07/13/19 2021 07/13/19 2132       Assessment/Plan HIV - biktarvy ABL anemia - hgb 10, stable.  Elevated BP -  not on HTN medications at baseline. Started norvasc 10mg  on 4/3, lisinopril 5mg  qd 4/4. IV labetolol PRN. CM consult for assistance finding PCP  Mass of the appendix S/p laparoscopic converted to open right hemicolectomy 4/1 Dr. Ninfa Linden -POD#4 - surgical pathology pending - suspect will need oncology consult once official path results are back - passing flatus - Advance to full liquids. Continue mobilizing.  -Wean narcotics. Add gabapentin. Add reglan.  -Hypokalemia: Replace K and Mag. Labs in AM.  ID -rocephin/flagyl 3/31>>day#5, stop today FEN -saline lock,IVF, FLD VTE -SCDs, lovenox Foley -d/c 4/2 Follow up -Dr. Ninfa Linden   LOS: 3 days    Clovis Riley, [facility] 07/18/2019, 8:59 AM Please see Amion for floor coverage

## 2019-07-19 ENCOUNTER — Other Ambulatory Visit: Payer: Self-pay | Admitting: Infectious Diseases

## 2019-07-19 DIAGNOSIS — B20 Human immunodeficiency virus [HIV] disease: Secondary | ICD-10-CM

## 2019-07-19 LAB — BASIC METABOLIC PANEL
Anion gap: 8 (ref 5–15)
BUN: <5 mg/dL — ABNORMAL LOW (ref 6–20)
CO2: 22 mmol/L (ref 22–32)
Calcium: 8.4 mg/dL — ABNORMAL LOW (ref 8.9–10.3)
Chloride: 109 mmol/L (ref 98–111)
Creatinine, Ser: 0.54 mg/dL (ref 0.44–1.00)
GFR calc Af Amer: >60 mL/min (ref >60)
GFR calc non Af Amer: >60 mL/min (ref >60)
Glucose, Bld: 126 mg/dL — ABNORMAL HIGH (ref 70–99)
Potassium: 3.5 mmol/L (ref 3.5–5.1)
Sodium: 139 mmol/L (ref 135–145)

## 2019-07-19 LAB — MAGNESIUM: Magnesium: 1.8 mg/dL (ref 1.7–2.4)

## 2019-07-19 LAB — CBC
HCT: 36.9 % (ref 36.0–46.0)
Hemoglobin: 11.3 g/dL — ABNORMAL LOW (ref 12.0–15.0)
MCH: 21.5 pg — ABNORMAL LOW (ref 26.0–34.0)
MCHC: 30.6 g/dL (ref 30.0–36.0)
MCV: 70.3 fL — ABNORMAL LOW (ref 80.0–100.0)
Platelets: 460 10*3/uL — ABNORMAL HIGH (ref 150–400)
RBC: 5.25 MIL/uL — ABNORMAL HIGH (ref 3.87–5.11)
RDW: 30.3 % — ABNORMAL HIGH (ref 11.5–15.5)
WBC: 8.4 10*3/uL (ref 4.0–10.5)
nRBC: 0.5 % — ABNORMAL HIGH (ref 0.0–0.2)

## 2019-07-19 LAB — SURGICAL PATHOLOGY

## 2019-07-19 MED ORDER — OXYCODONE HCL 5 MG PO TABS
5.0000 mg | ORAL_TABLET | Freq: Four times a day (QID) | ORAL | Status: DC | PRN
  Administered 2019-07-19: 5 mg via ORAL
  Filled 2019-07-19: qty 1

## 2019-07-19 MED ORDER — POTASSIUM CHLORIDE 20 MEQ PO PACK
40.0000 meq | PACK | Freq: Once | ORAL | Status: AC
  Administered 2019-07-19: 40 meq via ORAL
  Filled 2019-07-19: qty 2

## 2019-07-19 MED ORDER — HYDROMORPHONE HCL 1 MG/ML IJ SOLN: 0.5000 mg | Freq: Four times a day (QID) | INTRAMUSCULAR | Status: DC | PRN

## 2019-07-19 MED ORDER — METHOCARBAMOL 1000 MG/10ML IJ SOLN
500.0000 mg | Freq: Four times a day (QID) | INTRAVENOUS | Status: DC | PRN
  Filled 2019-07-19: qty 5

## 2019-07-19 MED ORDER — MAGNESIUM SULFATE 2 GM/50ML IV SOLN
2.0000 g | Freq: Once | INTRAVENOUS | Status: AC
  Administered 2019-07-19: 2 g via INTRAVENOUS
  Filled 2019-07-19: qty 50

## 2019-07-19 NOTE — Plan of Care (Signed)

## 2019-07-19 NOTE — Progress Notes (Signed)
[facility] Progress Note  5 Days Post-Op  Subjective: CC-  Feeling well this morning, denies nausea, had a bowel movement.  Pain well controlled  Objective: Vital signs in last 24 hours: Temp:  [98 F (36.7 C)-99 F (37.2 C)] 98.4 F (36.9 C) (04/06 0803) Pulse Rate:  [92-100] 98 (04/06 0803) Resp:  [17-20] 20 (04/06 0803) BP: (109-149)/(72-95) 147/95 (04/06 0803) SpO2:  [97 %-100 %] 99 % (04/06 0803) Last BM Date: 07/18/19  Intake/Output from previous day: 04/05 0701 - 04/06 0700 In: 466.9 [IV Piggyback:466.9] Out: 2 [Urine:1; Stool:1] Intake/Output this shift: No intake/output data recorded.  PE: Gen: Alert, NAD, pleasant HEENT: EOM's intact, pupils equal and round Card: RRR Pulm: CTAB, no W/R/R, rate and effort normal Abd: Soft, mild distension,appropriately tender  Lab Results:  Recent Labs    07/18/19 0406 07/19/19 0447  WBC 11.4* 8.4  HGB 10.9* 11.3*  HCT 35.0* 36.9  PLT 495* 460*   BMET Recent Labs    07/18/19 0406 07/19/19 0447  NA 141 139  K 3.3* 3.5  CL 110 109  CO2 20* 22  GLUCOSE 154* 126*  BUN <5* <5*  CREATININE 0.47 0.54  CALCIUM 8.3* 8.4*   PT/INR No results for input(s): LABPROT, INR in the last 72 hours. CMP     Component Value Date/Time   NA 139 07/19/2019 0447   K 3.5 07/19/2019 0447   CL 109 07/19/2019 0447   CO2 22 07/19/2019 0447   GLUCOSE 126 (H) 07/19/2019 0447   BUN <5 (L) 07/19/2019 0447   CREATININE 0.54 07/19/2019 0447   CREATININE 0.51 10/18/2018 0929   CALCIUM 8.4 (L) 07/19/2019 0447   PROT 7.9 07/13/2019 1245   ALBUMIN 3.4 (L) 07/13/2019 1245   AST 13 (L) 07/13/2019 1245   AST 13 (L) 09/23/2018 1000   ALT 10 07/13/2019 1245   ALT 13 09/23/2018 1000   ALKPHOS 63 07/13/2019 1245   BILITOT 0.8 07/13/2019 1245   BILITOT 0.6 09/23/2018 1000   GFRNONAA >60 07/19/2019 0447   GFRNONAA 115 10/18/2018 0929   GFRAA >60 07/19/2019 0447   GFRAA 134 10/18/2018 0929   Lipase     Component Value  Date/Time   LIPASE 21 07/13/2019 1245       Studies/Results: No results found.  Anti-infectives: Anti-infectives (From admission, onward)   Start     Dose/Rate Route Frequency Ordered Stop   07/14/19 2100  cefTRIAXone (ROCEPHIN) 2 g in sodium chloride 0.9 % 100 mL IVPB  Status:  Discontinued     2 g 200 mL/hr over 30 Minutes Intravenous Every 24 hours 07/13/19 2053 07/18/19 0902   07/14/19 2100  metroNIDAZOLE (FLAGYL) IVPB 500 mg  Status:  Discontinued     500 mg 100 mL/hr over 60 Minutes Intravenous Every 8 hours 07/13/19 2053 07/18/19 0902   07/13/19 2100  bictegravir-emtricitabine-tenofovir AF (BIKTARVY) 50-200-25 MG per tablet 1 tablet     1 tablet Oral Daily 07/13/19 2053     07/13/19 2030  cefTRIAXone (ROCEPHIN) 2 g in sodium chloride 0.9 % 100 mL IVPB     2 g 200 mL/hr over 30 Minutes Intravenous  Once 07/13/19 2021 07/13/19 2112   07/13/19 2030  metroNIDAZOLE (FLAGYL) IVPB 500 mg     500 mg 100 mL/hr over 60 Minutes Intravenous  Once 07/13/19 2021 07/13/19 2132       Assessment/Plan HIV - biktarvy ABL anemia - hgb 10, stable.  Elevated BP - not on HTN medications at baseline.  Started norvasc 10mg  on 4/3, lisinopril 5mg  qd 4/4. IV labetolol PRN. CM consult for assistance finding PCP  Mass of the appendix S/p laparoscopic converted to open right hemicolectomy 4/1 Dr. Ninfa Linden -POD#5 - surgical pathology T3N0 adenocarcinoma.  We will arrange outpatient oncology follow-up.  Discussed with patient. -Advance to soft diet. Continue mobilizing.  -Wean narcotics-she is really not taking much.  Continue gabapentin and scheduled Reglan -Hypokalemia: Replace K and Mag.   ID -rocephin/flagyl 3/31>>4/5 FEN -saline lock,IVF, soft diet VTE -SCDs, lovenox Foley -d/c 4/2 Follow up -Dr. Ninfa Linden  Anticipate discharge tomorrow   LOS: 4 days    Shirley White, [facility] 07/19/2019, 8:23 AM Please see Amion for floor coverage

## 2019-07-20 ENCOUNTER — Ambulatory Visit: Payer: Self-pay | Admitting: Obstetrics and Gynecology

## 2019-07-20 DIAGNOSIS — C189 Malignant neoplasm of colon, unspecified: Secondary | ICD-10-CM | POA: Diagnosis present

## 2019-07-20 LAB — CEA: CEA: 1.5 ng/mL (ref 0.0–4.7)

## 2019-07-20 MED ORDER — LISINOPRIL 5 MG PO TABS: 5.0000 mg | ORAL_TABLET | Freq: Every day | ORAL | 0 refills | Status: DC

## 2019-07-20 MED ORDER — AMLODIPINE BESYLATE 10 MG PO TABS: 10.0000 mg | ORAL_TABLET | Freq: Every day | ORAL | 0 refills | Status: DC

## 2019-07-20 MED ORDER — ACETAMINOPHEN 325 MG PO TABS: 650.0000 mg | ORAL_TABLET | Freq: Four times a day (QID) | ORAL | Status: DC | PRN

## 2019-07-20 MED ORDER — GABAPENTIN 300 MG PO CAPS: 300.0000 mg | ORAL_CAPSULE | Freq: Three times a day (TID) | ORAL | 0 refills | Status: DC

## 2019-07-20 MED ORDER — OXYCODONE HCL 5 MG PO TABS: 5.0000 mg | ORAL_TABLET | Freq: Three times a day (TID) | ORAL | 0 refills | Status: DC | PRN

## 2019-07-20 NOTE — Care Management Important Message (Signed)
Important Message  Patient Details  Name: Shirley White MRN: [id number] Date of Birth: 01-22-1971   Medicare Important Message Given:  Yes     [name] 07/20/2019, 4:26 PM

## 2019-07-20 NOTE — Discharge Summary (Signed)
Physician Discharge Summary  Patient ID: Shirley White MRN: [id number] DOB/AGE: 48-12-1970 48 y.o.  Admit date: 07/13/2019 Discharge date: 07/20/2019  Admission Diagnoses: appendicitis  Discharge Diagnoses:  Active Problems:   Acute appendicitis   Adenocarcinoma, colon [facility])   Discharged Condition: good  Hospital Course: She was admitted with appendicitis and possible appendiceal mass. She underwent laparoscopic converted to open right hemicolectomy. She had a brief post op ileus. 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 CEA 1.5. By post-op day 6 she was tolerating a diet, having bowel movements, and pain was well controlled with oral medications. She was deemed stable for discharge home. She will follow up with oncology as an outpatient.   Consults: None   Discharge Exam: Blood pressure (!) 120/97, pulse 80, temperature 97.8 F (36.6 C), temperature source Oral, resp. rate 17, height 5\' 1"  (1.549 m), weight 50.3 kg, SpO2 99 %. General appearance: alert and cooperative Resp: clear to auscultation bilaterally Cardio: regular rate and rhythm GI: soft, minimally appropriately tender. incision c/d/i with staples, no cellulitis or seroma/hematoma Extremities: extremities normal, atraumatic, no cyanosis or edema  Disposition: Discharge disposition: 01-Home or Self Care      Discharge Instructions     Call MD for:   Complete by: As directed    Temperature> 101.5   Call MD for:  persistant nausea and vomiting   Complete by: As directed    Call MD for:  redness, tenderness, or signs of infection (pain, swelling, redness, odor or green/yellow discharge around incision site)   Complete by: As directed    Call MD for:  severe uncontrolled pain   Complete by: As directed    Increase activity slowly   Complete by: As directed       Allergies as of 07/20/2019   No Known Allergies      Medication List     STOP taking these medications     acetaminophen-codeine 300-30 MG tablet Commonly known as: TYLENOL #3       TAKE these medications    acetaminophen 325 MG tablet Commonly known as: TYLENOL Take 2 tablets (650 mg total) by mouth every 6 (six) hours as needed.   amLODipine 10 MG tablet Commonly known as: NORVASC Take 1 tablet (10 mg total) by mouth daily. Follow up with PCP to refill or adjust Start taking on: July 21, 2019   Biktarvy 50-200-25 MG Tabs tablet Generic drug: bictegravir-emtricitabine-tenofovir AF TAKE 1 TABLET BY MOUTH DAILY, TO TAKE AT THE SAME TIME EACH DAY WITH OR WITHOUT FOOD What changed: See the new instructions.   ferrous sulfate 325 (65 FE) MG EC tablet Take 325 mg by mouth 2 (two) times daily with a meal.   gabapentin 300 MG capsule Commonly known as: NEURONTIN Take 1 capsule (300 mg total) by mouth 3 (three) times daily.   ibuprofen 200 MG tablet Commonly known as: ADVIL Take 400 mg by mouth every 6 (six) hours as needed for headache or mild pain.   lisinopril 5 MG tablet Commonly known as: ZESTRIL Take 1 tablet (5 mg total) by mouth daily. Follow up with PCP to refill or adjust Start taking on: July 21, 2019   ondansetron 4 MG disintegrating tablet Commonly known as: Zofran ODT Take 1 tablet (4 mg total) by mouth every 8 (eight) hours as needed for nausea or vomiting. What changed: reasons to take this   oxyCODONE 5 MG immediate release tablet Commonly known as: Oxy IR/ROXICODONE Take 1 tablet (5 mg  total) by mouth every 8 (eight) hours as needed for moderate pain.       Follow-up Information     Coralie Keens, MD. Go on 08/01/2019.   Specialty: General Surgery Why: Follow up scheduled for 3:00 PM. Please arrive 15 min prior to appointment time. Bring photo ID and insurance information.  Contact information: [address] [place] [address] [phone]         Surgery, [place]. Go on 07/28/2019.   Specialty: General Surgery Why:  staple removal scheduled for 10:00 AM. Please arrive 30 min prior to appointment time. Bring photo ID and insurance information.  Contact information: [place] [address] [phone]         [facility]cology Follow up.   Specialty: Oncology Why: [facility] should be working to arrange outpatient follow up for you.  Contact information: [place] [id number] [place] [id number]           Signed: Clovis Riley 07/20/2019, 11:39 AM

## 2019-07-20 NOTE — Care Management Important Message (Signed)
Important Message  Patient Details  Name: Nalleli States Severt MRN: [id number] Date of Birth: Aug 18, 1970   Medicare Important Message Given:     Patient left prior to IM being delivered.  IM mailed to home address.   Kenniya Westrich 07/20/2019, 4:26 PM

## 2019-07-20 NOTE — Plan of Care (Addendum)
Pt discharging to home. Discharge instructions explained to pt and pt verbalized understanding. No questions or concerns voiced at this time. Personal belongings gathered and returned to pt. Personal transportation on their way.   Problem: Education: Goal: Knowledge of General Education information will improve Description: Including pain rating scale, medication(s)/side effects and non-pharmacologic comfort measures Outcome: Progressing   Problem: Health Behavior/Discharge Planning: Goal: Ability to manage health-related needs will improve Outcome: Progressing   Problem: Clinical Measurements: Goal: Will remain free from infection Outcome: Progressing   Problem: Nutrition: Goal: Adequate nutrition will be maintained Outcome: Progressing   Problem: Pain Managment: Goal: General experience of comfort will improve Outcome: Progressing   Problem: Safety: Goal: Ability to remain free from injury will improve Outcome: Progressing   Problem: Skin Integrity: Goal: Risk for impaired skin integrity will decrease Outcome: Progressing   Problem: Education: Goal: Knowledge of General Education information will improve Description: Including pain rating scale, medication(s)/side effects and non-pharmacologic comfort measures Outcome: Progressing

## 2019-07-21 ENCOUNTER — Telehealth: Payer: Self-pay | Admitting: Oncology

## 2019-07-21 NOTE — Telephone Encounter (Signed)
Received a new pt referral from [facility] for a new dx of colon cancer. Shirley White has been cld and scheduled to see Dr. Benay Spice on 4/26 at 2pm. Pt aware to arrive 15 minutes early.

## 2019-07-25 ENCOUNTER — Telehealth: Payer: Self-pay | Admitting: *Deleted

## 2019-07-25 NOTE — Telephone Encounter (Signed)
Thank you - I called and talked with Shirley White and she updated me on recent hospitalization and diagnosis of colon (appendix) cancer. Plan for starting chemotherapy in the next 3 weeks.   Will touch base with her around that time - her CD4 had previously improved through 347 and I would expect this will drop with chemo. Discussed with Shirley White not to worry and we will help monitor; most importantly to continue taking her Biktarvy everyday, which she absolutely will.    HIV 1 RNA Quant (copies/mL)  Date Value  06/23/2019 <20 NOT DETECTED  02/24/2019 <20 NOT DETECTED  10/18/2018 <20 NOT DETECTED   CD4 T Cell Abs (/uL)  Date Value  06/23/2019 347 (L)  02/24/2019 309 (L)  10/18/2018 154 (L)

## 2019-07-25 NOTE — Telephone Encounter (Signed)
Patient called and wanted to talk to [facility].Marland Kitchen Stephanie sent her a message and call her to check up on her after her recent hospitalization. Patient is requesting a call back. Thank you

## 2019-07-25 NOTE — Telephone Encounter (Signed)
Patient returned your call - [phone]

## 2019-08-05 NOTE — Progress Notes (Signed)
Spoke with patient about her upcoming appointment on Monday 08/08/2019 with Dr. Benay Spice.  She is aware and states she will be here.  I asked that she arrive at 15 minutes early for the registration process.  I explained my role as GI nurse navigator and that I will see her on Monday when she comes in.  She verbalized an understanding.

## 2019-08-08 ENCOUNTER — Inpatient Hospital Stay: Payer: Self-pay | Attending: Oncology | Admitting: Oncology

## 2019-08-08 ENCOUNTER — Other Ambulatory Visit: Payer: Self-pay

## 2019-08-08 VITALS — BP 119/80 | HR 92 | Temp 98.9°F | Resp 18 | Ht 61.0 in | Wt 99.4 lb

## 2019-08-08 DIAGNOSIS — C189 Malignant neoplasm of colon, unspecified: Secondary | ICD-10-CM

## 2019-08-08 DIAGNOSIS — C181 Malignant neoplasm of appendix: Secondary | ICD-10-CM | POA: Insufficient documentation

## 2019-08-08 DIAGNOSIS — D500 Iron deficiency anemia secondary to blood loss (chronic): Secondary | ICD-10-CM

## 2019-08-08 MED ORDER — CAPECITABINE 500 MG PO TABS: ORAL_TABLET | ORAL | 0 refills | Status: DC

## 2019-08-08 NOTE — Progress Notes (Signed)
Met with patient and her husband Darnelle Maffucci at initial medical oncology appointment with Dr. Benay Spice.  She has been referred to SW who will call her tomorrow and to [facility] - nutritionist.  I obtained some samples of Ensure and coupons for her to try.  She and her husband understanding the plan is to start Xeloda next Monday 5/3 and to f/u with Dr. Benay Spice after being on for 2 weeks.  They were given my business card with my direct number to call and verbalized an understanding of my role as GI nurse navigator.

## 2019-08-08 NOTE — Progress Notes (Signed)
[facility] New Patient Consult   Requesting MD: Shirley White 48 y.o.  1970-07-13    Reason for Consult: Appendiceal carcinoma   HPI: Shirley White has a history of iron deficiency anemia.  She saw Dr. Walden Field last year.  The anemia was felt to be related to menorrhagia.  The anemia improved with iron replacement.  She was referred to Dr. Havery Moros for an upper and lower endoscopy on 11/23/2018.  She was found to have candidiasis in the esophagus.  Biopsies were taken from the stomach to look for Helicobacter pylori.  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  No cause for iron deficiency anemia was found.  A biopsy from the gastric antrum and body revealed chronic inactive gastritis.  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  She presented to the emergency room on 07/13/2019 with acute right lower abdominal pain.  She had a 3-week history of intermittent nausea.  A CT the abdomen pelvis revealed dilatation of the appendix tip up to 15 mm with prominent mucosal enhancement.  Masslike thickening of the more proximal appendix extended to the base and cecum.  Subcentimeter right lower quadrant mesenteric nodes.  Uterine fibroids.  2.1 cm rim-enhancing left adnexal lesion.  She was taken to the operating by Dr. Ninfa Linden on 07/14/2019.  A firm mass was noted at the appendix consistent with carcinoma.  There were enlarged mesenteric lymph nodes.  A large fibroid uterus was noted.  The mass was at the base of the appendix.  A right colectomy was performed.  No evidence of metastatic disease.  Ovaries appeared normal.  The pathology pathology revealed a moderately differentiated invasive adenocarcinoma arising at the appendiceal orifice.  No macroscopic tumor perforation.  Focal lymphovascular invasion.  No perineural invasion.  No tumor deposits.  0 /35 lymph nodes contained metastatic carcinoma.  No loss of mismatch repair protein  expression. Negative resection margins.  On the gross examination the tumor was located at the appendiceal orifice extending to involve two thirds of the appendix.  Tumor extends to the mesoappendix.  She was discharged in the hospital on 07/20/2019.  Shirley White was transfused with 2 units of packed red blood cells after the hemoglobin returned at 6.9 on 07/14/2019.   Past Medical History:  Diagnosis Date  . Anemia-iron deficiency   . GERD (gastroesophageal reflux disease)    no meds  . Heart murmur   . HIV disease ([place])     .  G4, P4   .  Hypertension   .  Appendix carcinoma (T3N0)                                                                                              07/14/2019  Past Surgical History:  Procedure Laterality Date  . CESAREAN SECTION    . ESOPHAGOGASTRODUODENOSCOPY N/A 02/03/2016   Procedure: ESOPHAGOGASTRODUODENOSCOPY (EGD);  Surgeon: Manus Gunning, MD;  Location: [facility];  Service: Gastroenterology;  Laterality: N/A;  . LAPAROSCOPIC APPENDECTOMY N/A 07/14/2019   Procedure: LAPAROSCOPIC APPENDECTOMY CONVERTED TO OPEN RIGHT HEMICOLECTOMY;  Surgeon: Ninfa Linden,  Nathaneil Canary, MD;  Location: [facility];  Service: General;  Laterality: N/A;  . RADIOACTIVE SEED GUIDED EXCISIONAL BREAST BIOPSY Right 06/23/2018   Procedure: RADIOACTIVE SEED GUIDED EXCISIONAL RIGHT BREAST BIOPSY;  Surgeon: Stark Klein, MD;  Location: [facility];  Service: General;  Laterality: Right;    Medications: Reviewed  Allergies: No Known Allergies  Family history: Her paternal grandfather had prostate cancer.  A maternal aunt had colon cancer between age 63 and 76.  Social History:   She lives with her fianc in [place].  She works as a Scientist, water quality.  She does not use cigarettes.  She reports social alcohol use.  No risk factor for hepatitis.  ROS:   Positives include: Acute abdominal pain 07/13/2019, nausea for 3 to 4 weeks prior to presenting to emergency room 07/13/2019, numbness  and pain in the feet for the past month  A complete ROS was otherwise negative.  Physical Exam:  Blood pressure 119/80, pulse 92, temperature 98.9 F (37.2 C), temperature source Temporal, resp. rate 18, height '5\' 1"'$  (1.549 m), weight 99 lb 6.4 oz (45.1 kg), SpO2 100 %.  HEENT: Neck without mass Lungs: Clear bilaterally Cardiac: Regular rate and rhythm, 2/6 systolic murmur Abdomen: No hepatosplenomegaly, no mass, nontender, healed surgical incision  Vascular: No leg edema Lymph nodes: No cervical, supraclavicular, axillary, or inguinal nodes Neurologic: Alert and oriented, the motor exam appears intact in the upper and lower extremities bilaterally, the deep tendon reflexes are 2+ at the knees bilaterally, sensation is intact to light touch at the soles Skin: No rash Musculoskeletal: No spine tenderness   LAB:  CBC  Lab Results  Component Value Date   WBC 8.4 07/19/2019   HGB 11.3 (L) 07/19/2019   HCT 36.9 07/19/2019   MCV 70.3 (L) 07/19/2019   PLT 460 (H) 07/19/2019   NEUTROABS 6.8 07/13/2019        CMP  Lab Results  Component Value Date   NA 139 07/19/2019   K 3.5 07/19/2019   CL 109 07/19/2019   CO2 22 07/19/2019   GLUCOSE 126 (H) 07/19/2019   BUN <5 (L) 07/19/2019   CREATININE 0.54 07/19/2019   CALCIUM 8.4 (L) 07/19/2019   PROT 7.9 07/13/2019   ALBUMIN 3.4 (L) 07/13/2019   AST 13 (L) 07/13/2019   ALT 10 07/13/2019   ALKPHOS 63 07/13/2019   BILITOT 0.8 07/13/2019   GFRNONAA >60 07/19/2019   GFRAA >60 07/19/2019     Lab Results  Component Value Date   CEA1 1.5 07/19/2019    Imaging: As per HPI, CT images from 07/13/2019 reviewed     Assessment/Plan:   1. 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 HIV positive 3. Hypertension 4. Family history of colon and prostate cancer 5. Gastroesophageal reflux disease 6. Bilateral foot numbness/pain 7. Iron deficiency anemia   Disposition:   Shirley White has been diagnosed with adenocarcinoma the appendiceal orifice.  She underwent a right colectomy.  She presented with abdominal pain and iron deficiency anemia.  It is unclear whether the anemia is related to the carcinoma or menorrhagia.  I encouraged her to continue iron therapy.  She has been diagnosed with clinical stage IIa disease.  She has a  good prognosis for a long-term disease-free survival.  We reviewed details of the surgical pathology report, the prognosis, and adjuvant treatment options.  I recommend treating her akin to a stage II colon cancer patient.  The tumor does not have high risk features other than lymphovascular invasion.  No tumor perforation, obstruction, perineural invasion, limited lymph node sampling, or elevated CEA.  Treatment options include observation versus adjuvant capecitabine.  We discussed the small predicted absolute benefit from adjuvant therapy in her case.  We reviewed potential toxicities associated with capecitabine including the chance for nausea, mucositis, diarrhea, alopecia, and hematologic toxicity.  We discussed the rash, hyperpigmentation, sun sensitivity, and hand/foot syndrome associated with capecitabine.  Shirley White would like to proceed with a course of adjuvant capecitabine.  She will attend a chemotherapy teaching class.  The plan is to begin adjuvant capecitabine on 08/15/2019.  She will be referred for a chest CT to complete a staging evaluation.  Shirley White does not appear to have hereditary nonpolyposis colon cancer syndrome, but her family members are potentially at increased risk of developing colorectal  cancer and should receive appropriate screening.  I will present her case at the GI tumor conference.  Betsy Coder, MD  08/08/2019, 2:14 PM

## 2019-08-09 ENCOUNTER — Telehealth: Payer: Self-pay | Admitting: Oncology

## 2019-08-09 ENCOUNTER — Encounter: Payer: Self-pay | Admitting: *Deleted

## 2019-08-09 ENCOUNTER — Telehealth: Payer: Self-pay | Admitting: *Deleted

## 2019-08-09 ENCOUNTER — Encounter: Payer: Self-pay | Admitting: Oncology

## 2019-08-09 ENCOUNTER — Telehealth: Payer: Self-pay | Admitting: Pharmacist

## 2019-08-09 DIAGNOSIS — C189 Malignant neoplasm of colon, unspecified: Secondary | ICD-10-CM

## 2019-08-09 MED ORDER — PROCHLORPERAZINE MALEATE 10 MG PO TABS: 10.0000 mg | ORAL_TABLET | Freq: Four times a day (QID) | ORAL | 1 refills | Status: DC | PRN

## 2019-08-09 MED ORDER — CAPECITABINE 500 MG PO TABS: ORAL_TABLET | ORAL | 0 refills | Status: DC

## 2019-08-09 NOTE — Telephone Encounter (Signed)
Scheduled appt per 4/26 sch message - pt aware of appt date and time

## 2019-08-09 NOTE — Telephone Encounter (Signed)
Oral Oncology Pharmacist Encounter  Received new prescription for Xeloda (capecitabine) for the adjuvant treatment of appendiceal carcinoma. Planned start 08/15/19.  BMP from 07/19/19 assessed, no relevant lab abnormalities. Prescription dose and frequency assessed.   Current medication list in Epic reviewed, no DDIs with capecitabine identified.  Prescription has been e-scribed to the [facility] for benefits analysis and approval.  Oral Oncology Clinic will continue to follow for insurance authorization, copayment issues, initial counseling and start date.  Darl Pikes, PharmD, BCPS, BCOP, CPP Hematology/Oncology Clinical Pharmacist [facility]/HP/AP [facility] [phone]  08/09/2019 8:53 AM

## 2019-08-09 NOTE — Progress Notes (Signed)
Called and left patient voicemail to introduce myself as Arboriculturist and to offer available resources.  Will discuss one-time $1000 Advertising account executive and qualifications to assist with medication and other personal expenses while going through treatment.  Left my contact name and number to return my call.

## 2019-08-09 NOTE — Progress Notes (Signed)
[facility] Psychosocial Distress Screening Clinical Social Work  Clinical Social Work was referred by distress screening protocol.  The patient scored a 8 on the Psychosocial Distress Thermometer which indicates moderate distress. Clinical Social Worker contacted patient by phone to assess for distress and other psychosocial needs.  Patient stated she was doing "ok", and her major concern was insurance.  Patient could not recall if she spoke with anyone while in the hospital regarding Medicaid.  CSW spoke with [facility] financial advocate and patients record shows that she was screened.  CSW emailed the inpatient financial navigator to get updated information on patients medicaid and social security disability status.  [facility] financial advocate is also planning to meet with patient at her next [facility] appointment.  CSW provided education on CSW role and information on the [facility] support team.  CSW will continue to follow and support as needed.           ONCBCN DISTRESS SCREENING 08/08/2019  Screening Type Initial Screening  Distress experienced in past week (1-10) 8  Practical problem type Insurance  Emotional problem type Depression;Adjusting to illness  Information Concerns Type Lack of info about diagnosis;Lack of info about treatment  Physical Problem type Pain;Sleep/insomnia;Getting around;Loss of appetitie;Tingling hands/feet  Physician notified of physical symptoms Yes  Referral to clinical psychology No  Referral to clinical social work Yes  Referral to dietition Yes  Referral to financial advocate Yes  Referral to support programs No  Referral to palliative care No    Johnnye Lana, MSW, LCSW, OSW-C Clinical Social Worker [place] [phone]

## 2019-08-09 NOTE — Telephone Encounter (Signed)
Notified patient of the CT chest for 08/17/19 at 1pm w/arrival at 1245. No dietary restrictions. Also made her aware of compazine script sent to [facility] outpatient pharmacy.

## 2019-08-11 ENCOUNTER — Telehealth: Payer: Self-pay

## 2019-08-11 ENCOUNTER — Encounter: Payer: Self-pay | Admitting: Nurse Practitioner

## 2019-08-11 ENCOUNTER — Encounter: Payer: Self-pay | Admitting: General Practice

## 2019-08-11 MED FILL — PROCHLORPERAZINE 10 MG TAB: 10 | 7 days supply | Qty: 30 | Fill #0

## 2019-08-11 MED FILL — CAPECITABINE 500 MG TABLET: 500 | 21 days supply | Qty: 70 | Fill #0

## 2019-08-11 NOTE — Progress Notes (Signed)
Received message regarding grant and Xeloda from [place] in pharmacy.  Called patient again to introduce myself as Arboriculturist and to offer available resources.  Discussed one-time $1000 Radio broadcast assistant to assist with medication cost while going through treatment. Provided fax number for patient to have last income received faxed. Patient will try to come today or tomorrow to sign grant paperwork. Patient also to meet Benjamine Mola in pharmacy to sign manufacturer assistance paperwork for Xeloda.   Patient has my contact name and number for any additional financial questions or concerns.  Spoke with Webb Silversmith in social work to assist.

## 2019-08-11 NOTE — Progress Notes (Signed)
[place] CSW Progress Notes  Met w patient in office, enrolled in [place] and provided first $50 disbursement (she can receive 3 additional disbursements).  Helped her meet w [employer] and Renne Crigler to enroll in applicable patient assistance funds for medication access and [employer].  Discussed [place] and resources available to her through programming.  Spoke w Ubaldo Glassing [phone]) to determine if she has been screened for Medicaid - per Ubaldo Glassing, she has not but will be screened today.  Also sent patient's information to [employer] so she can use Solicitor for [employer] as needed.  Edwyna Shell, LCSW Clinical Social Worker Phone:  [phone] Cell:  [phone]

## 2019-08-11 NOTE — Telephone Encounter (Signed)
Oral Oncology Patient Advocate Encounter  Met patient in lobby to complete application for [employer] an effort to reduce patient's out of pocket expense for Xeloda to $0.    Application completed and faxed to [phone].   [employer] patient assistance phone number for follow up is [phone].   This encounter will be updated until final determination.    [place] Patient [place] Phone [phone] Fax [phone] 08/11/2019 1:27 PM

## 2019-08-12 ENCOUNTER — Inpatient Hospital Stay: Payer: Self-pay

## 2019-08-12 NOTE — Telephone Encounter (Signed)
Oral Chemotherapy Pharmacist Encounter  Patient picked up her Xeloda from [place] yesterday 08/11/19. She knows the plan is to get started on 08/15/19.  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 Planned start 08/15/19.  Pt is doing well. Counseled patient on administration, dosing, side effects, monitoring, drug-food interactions, safe handling, storage, and disposal. Patient will take 3 tablets by mouth in AM and 2 tablets in PM. Take with food. Take for 14 days, then hold for 7 days. Repeat every 21 days.  Side effects include but not limited to: diarrhea, hand-foot syndrome, N/V, fatigue, edema, decreased wbc.    Reviewed with patient importance of keeping a medication schedule and plan for any missed doses.  Shirley White voiced understanding and appreciation. All questions answered. Medication handout and calendar placed in the mail.  Provided patient with Oral [facility] phone number. Patient knows to call the office with questions or concerns. Oral Chemotherapy Navigation [facility] will continue to follow.  Darl Pikes, PharmD, BCPS, BCOP, CPP Hematology/Oncology Clinical Pharmacist [facility]/HP/AP Oral [facility] [phone]  08/12/2019 10:15 AM

## 2019-08-16 ENCOUNTER — Inpatient Hospital Stay: Payer: Self-pay | Attending: Oncology

## 2019-08-16 ENCOUNTER — Telehealth: Payer: Self-pay | Admitting: *Deleted

## 2019-08-16 DIAGNOSIS — Z79899 Other long term (current) drug therapy: Secondary | ICD-10-CM | POA: Insufficient documentation

## 2019-08-16 DIAGNOSIS — C181 Malignant neoplasm of appendix: Secondary | ICD-10-CM | POA: Insufficient documentation

## 2019-08-16 DIAGNOSIS — D509 Iron deficiency anemia, unspecified: Secondary | ICD-10-CM | POA: Insufficient documentation

## 2019-08-16 DIAGNOSIS — I10 Essential (primary) hypertension: Secondary | ICD-10-CM | POA: Insufficient documentation

## 2019-08-16 DIAGNOSIS — M79671 Pain in right foot: Secondary | ICD-10-CM | POA: Insufficient documentation

## 2019-08-16 DIAGNOSIS — Z21 Asymptomatic human immunodeficiency virus [HIV] infection status: Secondary | ICD-10-CM | POA: Insufficient documentation

## 2019-08-16 DIAGNOSIS — K219 Gastro-esophageal reflux disease without esophagitis: Secondary | ICD-10-CM | POA: Insufficient documentation

## 2019-08-16 DIAGNOSIS — M79672 Pain in left foot: Secondary | ICD-10-CM | POA: Insufficient documentation

## 2019-08-16 DIAGNOSIS — Z8042 Family history of malignant neoplasm of prostate: Secondary | ICD-10-CM | POA: Insufficient documentation

## 2019-08-16 DIAGNOSIS — R200 Anesthesia of skin: Secondary | ICD-10-CM | POA: Insufficient documentation

## 2019-08-16 DIAGNOSIS — Z800 Family history of malignant neoplasm of digestive organs: Secondary | ICD-10-CM | POA: Insufficient documentation

## 2019-08-16 NOTE — Telephone Encounter (Signed)
Ben at [employer] called for advice. [facility] shows patient's ADAP expired 07/13/19. Please advise. Landis Gandy, RN

## 2019-08-16 NOTE — Telephone Encounter (Signed)
Patient is approved to receive Xeloda at no charge through [facility] 08/15/19-until notified.  [facility] uses [name] pharmacy [place] Patient [place] Phone [phone] Fax [phone] 08/16/2019 11:29 AM

## 2019-08-16 NOTE — Telephone Encounter (Signed)
Talked to [place] application was done back in Jan.  .. Patient application was not received by [facility] .. I will send the application off again to get it approved.  Pharmacy has samples. I called patient and LVM for her to call me so she can pick up samples.. RN notified.

## 2019-08-16 NOTE — Progress Notes (Signed)
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  Patient s/p right colectomy (4/1).  Past medical history of GERD, HIV, HTN.    Spoke with patient via phone for nutrition assessment.  Patient reports no food intolerances after surgery.  Reports typically has egg and grits and sausage for breakfast or egg and cornbeef hash.  Does not eat cereal.  Lunch is usually a sandwich with chips.  Dinner is cooked meal of chicken, shrimp, fish, steak, hamburger, spaghetti.  Likes fruit and green beans, greens, spinach, corn, pinto beans, navy beans.  Reports that she is only able to drink about 1/2 of ensure and if she drinks more makes her sick.  Drinks soda, tea, some water but does not like it.  Reports 2 episodes last night of diarrhea, has started xleloda.     Medications: xeloda, compazine, imodium   Labs: reviewed   Anthropometrics:   Height: 61 inches Weight: 99 lb 6.4 oz UBW: 100s before surgery (3/11 106 lb) BMI: 18  7% weight loss in the last month, significant   Estimated Energy Needs  Kcals: 1350-1575 Protein: 68-79 g Fluid: > 1.3 L   NUTRITION DIAGNOSIS: Inadequate oral intake related to cancer, surgery as evidenced by 7% weight loss in the last month following surgery   INTERVENTION: Discussed importance of good nutrition during treatment. Reviewed foods to eat while having diarrhea.  Will mail patient handout on Diarrhea Nutrition Therapy from AND Encouraged good sources of protein and reviewed foods that contain protein. Discussed ways to try ensure (diluted) as well as other options for patient to try (boost, carnation breakfast essentials) Provided patient with contact information   MONITORING, EVALUATION, GOAL: Patient will consume adequate calories and protein to maintain weight   Next Visit: June 1 phone  f/u  Jahson Emanuele B. Zenia Resides, [place], [place] Registered Dietitian [phone] (pager)

## 2019-08-17 ENCOUNTER — Ambulatory Visit (HOSPITAL_COMMUNITY): Payer: Self-pay

## 2019-08-17 ENCOUNTER — Telehealth: Payer: Self-pay | Admitting: *Deleted

## 2019-08-17 ENCOUNTER — Other Ambulatory Visit: Payer: Self-pay

## 2019-08-17 NOTE — Telephone Encounter (Signed)
Patient missed her CT scan today due to Lyft driver going to wrong house. Her address in computer was not accurate. This was corrected today. She would like to try and have the CT chest on 5/18, same day she sees Dr. Benay Spice if that is possible.

## 2019-08-17 NOTE — Telephone Encounter (Signed)
Patient picked up 3 bottles today while the application is reprocessing with adap.

## 2019-08-18 ENCOUNTER — Encounter: Payer: Self-pay | Admitting: Infectious Diseases

## 2019-08-18 ENCOUNTER — Telehealth: Payer: Self-pay | Admitting: *Deleted

## 2019-08-18 ENCOUNTER — Ambulatory Visit (INDEPENDENT_AMBULATORY_CARE_PROVIDER_SITE_OTHER): Payer: Self-pay | Admitting: Infectious Diseases

## 2019-08-18 ENCOUNTER — Other Ambulatory Visit: Payer: Self-pay

## 2019-08-18 DIAGNOSIS — C181 Malignant neoplasm of appendix: Secondary | ICD-10-CM

## 2019-08-18 DIAGNOSIS — C189 Malignant neoplasm of colon, unspecified: Secondary | ICD-10-CM

## 2019-08-18 DIAGNOSIS — I10 Essential (primary) hypertension: Secondary | ICD-10-CM

## 2019-08-18 DIAGNOSIS — G629 Polyneuropathy, unspecified: Secondary | ICD-10-CM

## 2019-08-18 DIAGNOSIS — Z21 Asymptomatic human immunodeficiency virus [HIV] infection status: Secondary | ICD-10-CM

## 2019-08-18 MED ORDER — GABAPENTIN 300 MG PO CAPS: 300.0000 mg | ORAL_CAPSULE | Freq: Three times a day (TID) | ORAL | 3 refills | Status: DC

## 2019-08-18 MED ORDER — LISINOPRIL 5 MG PO TABS: 5.0000 mg | ORAL_TABLET | Freq: Every day | ORAL | 3 refills | Status: DC

## 2019-08-18 NOTE — Telephone Encounter (Signed)
Notified patient that Dr. Benay Spice said OK to have her CT chest on 5/18 prior to OV. Scheduled for 1:45/2:00 pm. Notified transportation.

## 2019-08-18 NOTE — Progress Notes (Signed)
Name: Shirley White  DOB: 03-23-1971 MRN: [id number] PCP: Patient, No Pcp Per    Patient Active Problem List   Diagnosis Date Noted  . HIV (human immunodeficiency virus infection) ([place]) 07/14/2018    Priority: High  . Neuropathy 08/19/2019  . Hypertension 08/19/2019  . 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 Adenocarcinoma, colon ([place]) 07/20/2019  . 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 Thrombocytopenia ([place]) 12/12/2017  . Routine health maintenance 08/28/2016  . H/O menorrhagia 09/05/2014  . Iron deficiency anemia 09/05/2014  . Sickle-cell trait ([place]) 06/04/2006     Brief Narrative:  Shirley White is a 49 y.o. woman with HIV disease originally diagnosed in 2005. Poor adherence with AIDS qualifying CD4s since 2016 with CD4 nadir 30. HIV Risk: heterosexual. History of OIs: esophageal candidiasis.   Previous Regimens: . Complera 2011 >> difficulty swallowing regimen . Emtriva + Edurant + Viread 2011 through 01-2014 . Odefsey 2016 . Tiviay + Truvada 2018  . Biktarvy 2019  Genotypes: . 11-6765 - sensitive . 05-945 - sensitive  .   Subjective:  CC: HIV follow up care.  Hospitalized recently and diagnosed with appendix/colon cancer.  Nerve pain in the fee.     HPI: Hospitalized recently with what was initially thought to be acute appendicitis and founds to have appendix/colon cancer. She has met with the oncology team and started on chemotherapy this week. She is nervous about possibly losing her hair.   Has continued on her Biktarvy daily along with her iron pill. Recently started on blood pressure pill in the hospital and gabapentin for nerve pain. She has not taken it however because she wanted to ensure it was OK with her HIV medication first.  She has had pins and needles tingling pain in the bottoms of her feet for a while now and it has been acutely getting worse.    Review of Systems  Constitutional: Positive for fatigue. Negative for  appetite change, chills, diaphoresis, fever and unexpected weight change.  HENT: Negative for dental problem, sinus pain and sore throat.   Respiratory: Negative for cough, chest tightness, shortness of breath and wheezing.   Cardiovascular: Negative for chest pain, palpitations and leg swelling.  Gastrointestinal: Positive for abdominal distention. Negative for abdominal pain, blood in stool, constipation, diarrhea, nausea and vomiting.  Endocrine: Negative for cold intolerance, heat intolerance, polydipsia and polyuria.  Genitourinary: Positive for menstrual problem (heavy cycles that are irregular. ). Negative for difficulty urinating, dyspareunia, pelvic pain, vaginal bleeding and vaginal discharge.  Musculoskeletal: Negative for arthralgias, joint swelling and myalgias.  Skin: Negative for color change and rash.  Neurological: Negative for dizziness, syncope and light-headedness.  Hematological: Negative for adenopathy.     Past Medical History:  Diagnosis Date  . Anemia   . GERD (gastroesophageal reflux disease)    no meds  . Heart murmur   . HIV disease [facility])     Outpatient Medications Prior to Visit  Medication Sig Dispense Refill  . acetaminophen (TYLENOL) 325 MG tablet Take 2 tablets (650 mg total) by mouth every 6 (six) hours as needed.    Marland Kitchen amLODipine (NORVASC) 10 MG tablet Take 1 tablet (10 mg total) by mouth daily. Follow up with PCP to refill or adjust 30 tablet 0  . BIKTARVY 50-200-25 MG TABS tablet TAKE 1 TABLET BY MOUTH DAILY, TO TAKE AT THE SAME TIME EACH DAY WITH OR WITHOUT FOOD 30 tablet 5  . oxyCODONE (OXY IR/ROXICODONE) 5 MG immediate  release tablet Take 1 tablet (5 mg total) by mouth every 8 (eight) hours as needed for moderate pain. 10 tablet 0  . gabapentin (NEURONTIN) 300 MG capsule Take 1 capsule (300 mg total) by mouth 3 (three) times daily. 21 capsule 0  . lisinopril (ZESTRIL) 5 MG tablet Take 1 tablet (5 mg total) by mouth daily. Follow up with PCP to  refill or adjust 30 tablet 0  . capecitabine (XELODA) 500 MG tablet Take 3 tablets by mouth in AM and 2 tablets in PM. Take with food. Take for 14 days, then hold for 7 days. Repeat every 21 days. (Patient not taking: Reported on 08/18/2019) 70 tablet 0  . prochlorperazine (COMPAZINE) 10 MG tablet Take 1 tablet (10 mg total) by mouth every 6 (six) hours as needed for nausea. (Patient not taking: Reported on 08/18/2019) 30 tablet 1   No facility-administered medications prior to visit.     No Known Allergies  Social History   Tobacco Use  . Smoking status: Never Smoker  . Smokeless tobacco: Never Used  Substance Use Topics  . Alcohol use: Yes    Alcohol/week: 1.0 standard drinks    Types: 1 Cans of beer per week    Comment: occa  . Drug use: Not Currently    Types: Marijuana    Social History   Substance and Sexual Activity  Sexual Activity Not Currently  . Partners: Male  . Birth control/protection: Condom   Comment: given condoms     Objective:   Vitals:   08/18/19 1531  BP: (!) 147/89  Pulse: 69  Temp: 98.3 F (36.8 C)  TempSrc: Oral  Weight: 101 lb 3.2 oz (45.9 kg)   Body mass index is 19.12 kg/m.   Physical Exam Vitals reviewed.  Constitutional:      Appearance: She is well-developed.     Comments: Seated comfortably in chair. Appears fatigued today  HENT:     Mouth/Throat:     Mouth: No oral lesions.     Dentition: Normal dentition. No dental abscesses.     Pharynx: No oropharyngeal exudate.  Cardiovascular:     Rate and Rhythm: Normal rate and regular rhythm.     Heart sounds: Murmur (diastolic murmur ) present. Gallop present. S3 sounds present.   Pulmonary:     Effort: Pulmonary effort is normal.     Breath sounds: Normal breath sounds.  Abdominal:     General: There is distension.     Palpations: Abdomen is soft.     Tenderness: There is no abdominal tenderness.  Lymphadenopathy:     Cervical: No cervical adenopathy.  Skin:    General: Skin  is warm and dry.     Capillary Refill: Capillary refill takes less than 2 seconds.     Findings: No rash.  Neurological:     Mental Status: She is alert and oriented to person, place, and time.  Psychiatric:        Judgment: Judgment normal.     Comments: In good spirits today and engaged in care discussion     Lab Results Lab Results  Component Value Date   WBC 8.4 07/19/2019   HGB 11.3 (L) 07/19/2019   HCT 36.9 07/19/2019   MCV 70.3 (L) 07/19/2019   PLT 460 (H) 07/19/2019    Lab Results  Component Value Date   CREATININE 0.54 07/19/2019   BUN <5 (L) 07/19/2019   NA 139 07/19/2019   K 3.5 07/19/2019   CL 109 07/19/2019  CO2 22 07/19/2019    Lab Results  Component Value Date   ALT 10 07/13/2019   AST 13 (L) 07/13/2019   ALKPHOS 63 07/13/2019   BILITOT 0.8 07/13/2019    Lab Results  Component Value Date   CHOL 143 08/21/2015   HDL 61 08/21/2015   LDLCALC 70 08/21/2015   TRIG 62 08/21/2015   CHOLHDL 2.3 08/21/2015   HIV 1 RNA Quant (copies/mL)  Date Value  06/23/2019 <20 NOT DETECTED  02/24/2019 <20 NOT DETECTED  10/18/2018 <20 NOT DETECTED   CD4 T Cell Abs (/uL)  Date Value  06/23/2019 347 (L)  02/24/2019 309 (L)  10/18/2018 154 (L)     Assessment & Plan:   Problem List Items Addressed This Visit      High   HIV (human immunodeficiency virus infection) ([place])    Doing well on her [place]. Will have her back as scheduled after 4-6 weeks on chemo treatment and repeat her CD4 and viral load. Prophylaxis as indicated. She has recovered from surgery and doing well overall.         Unprioritized   Adenocarcinoma, colon ([place])   Primary carcinoma of appendix [facility])    Appreciate oncology team. She states she has had great care with them and looking forward to utilizing some of their support options - plans to start yoga zoom meetings with other patients next week.       Neuropathy    This pre-dates her chemo but it has been worse lately. 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 Discussed that keeping her HIV under control will help prevent further damage but will not likely reverse. She will try the gabapentin as recommended from the hospital and start with PM dose and titrate up to TID if she can tolerate this. Alteratively could consider Cymbalta for once daily option or lyrica.  Consider ALA supplement also.       Hypertension    Started on Lisinopril 5 mg in the hospital. Will refill and see how BP is next month. Hold off on diuretic with h/o dizziness recently.  Referral discussed for Primary Care team today - she will call around to schedule.       Relevant Medications   lisinopril (ZESTRIL) 5 MG tablet      Janene Madeira, MSN, NP-C [facility] [place] Pager: [phone] Office: [phone]  08/19/19  1:34 PM

## 2019-08-18 NOTE — Patient Instructions (Addendum)
So nice to see you!   Please continue your Biktarvy every day.   Will see how you are doing in June on your chemo and plan to repeat your CD4 then.   Please schedule an appointment with Marcie Bal our counselor next week.   For your foot pain I think you are having trouble with Neuropathy -  --Try the gabapentin (small yellow capsules) every night for now then increase to three times a day to see if this helps.     Neuropathic Pain Neuropathic pain is pain caused by damage to the nerves that are responsible for certain sensations in your body (sensory nerves). The pain can be caused by:  Damage to the sensory nerves that send signals to your spinal cord and brain (peripheral nervous system).  Damage to the sensory nerves in your brain or spinal cord (central nervous system). Neuropathic pain can make you more sensitive to pain. Even a minor sensation can feel very painful. This is usually a long-term condition that can be difficult to treat. The type of pain differs from person to person. It may:  Start suddenly (acute), or it may develop slowly and last for a long time (chronic).  Come and go as damaged nerves heal, or it may stay at the same level for years.  Cause emotional distress, loss of sleep, and a lower quality of life. What are the causes? The most common cause of this condition is diabetes. Many other diseases and conditions can also cause neuropathic pain. Causes of neuropathic pain can be classified as:  Toxic. This is caused by medicines and chemicals. The most common cause of toxic neuropathic pain is damage from cancer treatments (chemotherapy).  Metabolic. This can be caused by: ? Diabetes. This is the most common disease that damages the nerves. ? Lack of vitamin B from long-term alcohol abuse.  Traumatic. Any injury that cuts, crushes, or stretches a nerve can cause damage and pain. A common example is feeling pain after losing an arm or leg (phantom limb  pain).  Compression-related. If a sensory nerve gets trapped or compressed for a long period of time, the blood supply to the nerve can be cut off.  Vascular. Many blood vessel diseases can cause neuropathic pain by decreasing blood supply and oxygen to nerves.  Autoimmune. This type of pain results from diseases in which the body's defense system (immune system) mistakenly attacks sensory nerves. Examples of autoimmune diseases that can cause neuropathic pain include lupus and multiple sclerosis.  Infectious. Many types of viral infections can damage sensory nerves and cause pain. Shingles infection is a common cause of this type of pain.  Inherited. Neuropathic pain can be a symptom of many diseases that are passed down through families (genetic). What increases the risk? You are more likely to develop this condition if:  You have diabetes.  You smoke.  You drink too much alcohol.  You are taking certain medicines, including medicines that kill cancer cells (chemotherapy) or that treat immune system disorders. What are the signs or symptoms? The main symptom is pain. Neuropathic pain is often described as:  Burning.  Shock-like.  Stinging.  Hot or cold.  Itching. How is this diagnosed? No single test can diagnose neuropathic pain. It is diagnosed based on:  Physical exam and your symptoms. Your health care provider will ask you about your pain. You may be asked to use a pain scale to describe how bad your pain is.  Tests. These may be done  to see if you have a high sensitivity to pain and to help find the cause and location of any sensory nerve damage. They include: ? Nerve conduction studies to test how well nerve signals travel through your sensory nerves (electrodiagnostic testing). ? Stimulating your sensory nerves through electrodes on your skin and measuring the response in your spinal cord and brain (somatosensory evoked potential).  Imaging studies, such  as: ? X-rays. ? CT scan. ? MRI. How is this treated? Treatment for neuropathic pain may change over time. You may need to try different treatment options or a combination of treatments. Some options include:  Treating the underlying cause of the neuropathy, such as diabetes, kidney disease, or vitamin deficiencies.  Stopping medicines that can cause neuropathy, such as chemotherapy.  Medicine to relieve pain. Medicines may include: ? Prescription or over-the-counter pain medicine. ? Anti-seizure medicine. ? Antidepressant medicines. ? Pain-relieving patches that are applied to painful areas of skin. ? A medicine to numb the area (local anesthetic), which can be injected as a nerve block.  Transcutaneous nerve stimulation. This uses electrical currents to block painful nerve signals. The treatment is painless.  Alternative treatments, such as: ? Acupuncture. ? Meditation. ? Massage. ? Physical therapy. ? Pain management programs. ? Counseling. Follow these instructions at home: Medicines   Take over-the-counter and prescription medicines only as told by your health care provider.  Do not drive or use heavy machinery while taking prescription pain medicine.  If you are taking prescription pain medicine, take actions to prevent or treat constipation. Your health care provider may recommend that you: ? Drink enough fluid to keep your urine pale yellow. ? Eat foods that are high in fiber, such as fresh fruits and vegetables, whole grains, and beans. ? Limit foods that are high in fat and processed sugars, such as fried or sweet foods. ? Take an over-the-counter or prescription medicine for constipation. Lifestyle   Have a good support system at home.  Consider joining a chronic pain support group.  Do not use any products that contain nicotine or tobacco, such as cigarettes and e-cigarettes. If you need help quitting, ask your health care provider.  Do not drink  alcohol. General instructions  Learn as much as you can about your condition.  Work closely with all your health care providers to find the treatment plan that works best for you.  Ask your health care provider what activities are safe for you.  Keep all follow-up visits as told by your health care provider. This is important. Contact a health care provider if:  Your pain treatments are not working.  You are having side effects from your medicines.  You are struggling with tiredness (fatigue), mood changes, depression, or anxiety. Summary  Neuropathic pain is pain caused by damage to the nerves that are responsible for certain sensations in your body (sensory nerves).  Neuropathic pain may come and go as damaged nerves heal, or it may stay at the same level for years.  Neuropathic pain is usually a long-term condition that can be difficult to treat. Consider joining a chronic pain support group. This information is not intended to replace advice given to you by your health care provider. Make sure you discuss any questions you have with your health care provider. Document Revised: 07/22/2018 Document Reviewed: 04/17/2017 Elsevier Patient Education  [place].

## 2019-08-19 DIAGNOSIS — I10 Essential (primary) hypertension: Secondary | ICD-10-CM | POA: Insufficient documentation

## 2019-08-19 DIAGNOSIS — G629 Polyneuropathy, unspecified: Secondary | ICD-10-CM | POA: Insufficient documentation

## 2019-08-19 NOTE — Assessment & Plan Note (Signed)
Appreciate oncology team. She states she has had great care with them and looking forward to utilizing some of their support options - plans to start yoga zoom meetings with other patients next week.

## 2019-08-19 NOTE — Assessment & Plan Note (Signed)
This pre-dates her chemo but it has been worse lately. 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 Discussed that keeping her HIV under control will help prevent further damage but will not likely reverse. She will try the gabapentin as recommended from the hospital and start with PM dose and titrate up to TID if she can tolerate this. Alteratively could consider Cymbalta for once daily option or lyrica.  Consider ALA supplement also.

## 2019-08-19 NOTE — Assessment & Plan Note (Signed)
Doing well on her Biktarvy. Will have her back as scheduled after 4-6 weeks on chemo treatment and repeat her CD4 and viral load. Prophylaxis as indicated. She has recovered from surgery and doing well overall.

## 2019-08-19 NOTE — Assessment & Plan Note (Deleted)
Appreciate oncology team. She states she has had great care with them and looking forward to utilizing some of their support options - plans to start yoga zoom meetings with other patients next week.

## 2019-08-19 NOTE — Assessment & Plan Note (Signed)
Started on Lisinopril 5 mg in the hospital. Will refill and see how BP is next month. Hold off on diuretic with h/o dizziness recently.  Referral discussed for Primary Care team today - she will call around to schedule.

## 2019-08-23 ENCOUNTER — Ambulatory Visit: Payer: Self-pay

## 2019-08-30 ENCOUNTER — Encounter: Payer: Self-pay | Admitting: Nurse Practitioner

## 2019-08-30 ENCOUNTER — Ambulatory Visit (HOSPITAL_COMMUNITY)
Admission: RE | Admit: 2019-08-30 | Discharge: 2019-08-30 | Disposition: A | Discharge status: Home / Self Care | Payer: Self-pay | Source: Ambulatory Visit | Attending: Oncology | Admitting: Oncology

## 2019-08-30 ENCOUNTER — Other Ambulatory Visit: Payer: Self-pay

## 2019-08-30 ENCOUNTER — Inpatient Hospital Stay: Payer: Self-pay

## 2019-08-30 ENCOUNTER — Inpatient Hospital Stay (HOSPITAL_BASED_OUTPATIENT_CLINIC_OR_DEPARTMENT_OTHER): Payer: Self-pay | Admitting: Nurse Practitioner

## 2019-08-30 VITALS — BP 120/83 | HR 87 | Temp 98.1°F | Resp 17 | Ht 61.0 in | Wt 101.7 lb

## 2019-08-30 DIAGNOSIS — C189 Malignant neoplasm of colon, unspecified: Secondary | ICD-10-CM

## 2019-08-30 DIAGNOSIS — D500 Iron deficiency anemia secondary to blood loss (chronic): Secondary | ICD-10-CM

## 2019-08-30 DIAGNOSIS — C181 Malignant neoplasm of appendix: Secondary | ICD-10-CM | POA: Insufficient documentation

## 2019-08-30 LAB — CBC WITH DIFFERENTIAL (CANCER CENTER ONLY)
Abs Immature Granulocytes: 0.02 10*3/uL (ref 0.00–0.07)
Basophils Absolute: 0.1 10*3/uL (ref 0.0–0.1)
Basophils Relative: 1 %
Eosinophils Absolute: 0.1 10*3/uL (ref 0.0–0.5)
Eosinophils Relative: 2 %
HCT: 33.8 % — ABNORMAL LOW (ref 36.0–46.0)
Hemoglobin: 10.8 g/dL — ABNORMAL LOW (ref 12.0–15.0)
Immature Granulocytes: 0 %
Lymphocytes Relative: 37 %
Lymphs Abs: 2.3 10*3/uL (ref 0.7–4.0)
MCH: 23.9 pg — ABNORMAL LOW (ref 26.0–34.0)
MCHC: 32 g/dL (ref 30.0–36.0)
MCV: 74.9 fL — ABNORMAL LOW (ref 80.0–100.0)
Monocytes Absolute: 0.7 10*3/uL (ref 0.1–1.0)
Monocytes Relative: 11 %
Neutro Abs: 3 10*3/uL (ref 1.7–7.7)
Neutrophils Relative %: 49 %
Platelet Count: 429 10*3/uL — ABNORMAL HIGH (ref 150–400)
RBC: 4.51 MIL/uL (ref 3.87–5.11)
WBC Count: 6.2 10*3/uL (ref 4.0–10.5)
nRBC: 0 % (ref 0.0–0.2)

## 2019-08-30 LAB — CMP (CANCER CENTER ONLY)
ALT: 8 U/L (ref 0–44)
AST: 10 U/L — ABNORMAL LOW (ref 15–41)
Albumin: 3.5 g/dL (ref 3.5–5.0)
Alkaline Phosphatase: 62 U/L (ref 38–126)
Anion gap: 9 (ref 5–15)
BUN: 8 mg/dL (ref 6–20)
CO2: 27 mmol/L (ref 22–32)
Calcium: 9 mg/dL (ref 8.9–10.3)
Chloride: 103 mmol/L (ref 98–111)
Creatinine: 0.65 mg/dL (ref 0.44–1.00)
GFR, Est AFR Am: >60 mL/min (ref >60)
GFR, Estimated: >60 mL/min (ref >60)
Glucose, Bld: 96 mg/dL (ref 70–99)
Potassium: 3.5 mmol/L (ref 3.5–5.1)
Sodium: 139 mmol/L (ref 135–145)
Total Bilirubin: 1.2 mg/dL (ref 0.3–1.2)
Total Protein: 7.5 g/dL (ref 6.5–8.1)

## 2019-08-30 NOTE — Progress Notes (Signed)
  Shirley White OFFICE PROGRESS NOTE   Diagnosis: Appendiceal carcinoma  INTERVAL HISTORY:   Shirley White returns as scheduled.  She began cycle 1 adjuvant Xeloda on 08/15/2019.  She denies nausea/vomiting.  No mouth sores.  2 episodes of loose stools.  No hand or foot redness.  She has pain in both feet which predated Xeloda.  She recently began gabapentin.  She denies bleeding.  She is taking oral iron twice a day.  Objective:  Vital signs in last 24 hours:  Blood pressure 120/83, pulse 87, temperature 98.1 F (36.7 C), temperature source Temporal, resp. rate 17, height _0  (1.549 m), weight 101 lb 11.2 oz (46.1 kg), SpO2 100 %.    HEENT: No thrush or ulcers. GI: Abdomen soft and nontender.  No hepatomegaly. Vascular: No leg edema. Neuro: Alert and oriented. Skin: Palms and soles without erythema.   Lab Results:  Lab Results  Component Value Date   WBC 6.2 08/30/2019   HGB 10.8 (L) 08/30/2019   HCT 33.8 (L) 08/30/2019   MCV 74.9 (L) 08/30/2019   PLT 429 (H) 08/30/2019   NEUTROABS 3.0 08/30/2019    Imaging:  No results found.  Medications: I have reviewed the patient's current medications.  Assessment/Plan: 1. 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 Right colectomy 07/14/2019, T3N0 tumor, 0/35 lymph nodes, no loss of mismatch repair protein expression, no macroscopic tumor perforation, focal lymphovascular invasion ? CT abdomen/pelvis 07/13/2019-masslike thickening of the appendix with dilatation of the tip with mucosal enhancement, uterine fibroids, subcentimeter right lower quadrant mesenteric nodes ? Cycle 1 Xeloda 08/15/2019 ? Cycle 2 Xeloda 09/05/2019 2. HIV positive 3. Hypertension 4. Family history of colon and prostate cancer 5. Gastroesophageal reflux disease 6. Bilateral foot numbness/pain-gabapentin prescribed 7. Iron deficiency anemia-on oral iron  Disposition: Shirley White appears stable.  She has completed 1 cycle  of adjuvant Xeloda.  She tolerated well.  She will begin cycle 2 on 09/05/2019.  We reviewed the CBC from today.  Counts adequate to continue with Xeloda as above.  She has a persistent microcytic anemia, currently on oral iron.  We will check a ferritin with her next lab appointment.  She will return for lab and follow-up in 3 weeks.  She will contact the office in the interim with any problems.  Ned Card ANP/GNP-BC   08/30/2019  3:02 PM

## 2019-08-31 ENCOUNTER — Telehealth: Payer: Self-pay | Admitting: Oncology

## 2019-08-31 ENCOUNTER — Other Ambulatory Visit: Payer: Self-pay

## 2019-08-31 NOTE — Telephone Encounter (Signed)
Scheduled per los. Called and spoke with patient. Confirmed appt 

## 2019-09-01 ENCOUNTER — Ambulatory Visit: Payer: Self-pay

## 2019-09-09 ENCOUNTER — Telehealth: Payer: Self-pay | Admitting: Nutrition

## 2019-09-09 NOTE — Telephone Encounter (Signed)
Contacted patient to verify phone visit for pre reg °

## 2019-09-09 NOTE — Telephone Encounter (Signed)
Contacted patient to verify telephone visit for pre reg °

## 2019-09-13 ENCOUNTER — Inpatient Hospital Stay: Payer: Self-pay | Attending: Oncology

## 2019-09-13 ENCOUNTER — Telehealth: Payer: Self-pay

## 2019-09-13 DIAGNOSIS — Z8042 Family history of malignant neoplasm of prostate: Secondary | ICD-10-CM | POA: Insufficient documentation

## 2019-09-13 DIAGNOSIS — K219 Gastro-esophageal reflux disease without esophagitis: Secondary | ICD-10-CM | POA: Insufficient documentation

## 2019-09-13 DIAGNOSIS — I10 Essential (primary) hypertension: Secondary | ICD-10-CM | POA: Insufficient documentation

## 2019-09-13 DIAGNOSIS — D509 Iron deficiency anemia, unspecified: Secondary | ICD-10-CM | POA: Insufficient documentation

## 2019-09-13 DIAGNOSIS — Z21 Asymptomatic human immunodeficiency virus [HIV] infection status: Secondary | ICD-10-CM | POA: Insufficient documentation

## 2019-09-13 DIAGNOSIS — Z800 Family history of malignant neoplasm of digestive organs: Secondary | ICD-10-CM | POA: Insufficient documentation

## 2019-09-13 DIAGNOSIS — R200 Anesthesia of skin: Secondary | ICD-10-CM | POA: Insufficient documentation

## 2019-09-13 DIAGNOSIS — C181 Malignant neoplasm of appendix: Secondary | ICD-10-CM | POA: Insufficient documentation

## 2019-09-13 NOTE — Telephone Encounter (Signed)
Nutrition  Called patient for nutrition follow-up as planned.  No answer. Left message on voicemail with call back number.   Kaion Tisdale B. Zenia Resides, [place], [place] Registered Dietitian [phone] (pager)

## 2019-09-20 ENCOUNTER — Inpatient Hospital Stay (HOSPITAL_BASED_OUTPATIENT_CLINIC_OR_DEPARTMENT_OTHER): Payer: Self-pay | Admitting: Nurse Practitioner

## 2019-09-20 ENCOUNTER — Inpatient Hospital Stay: Payer: Self-pay

## 2019-09-20 ENCOUNTER — Other Ambulatory Visit: Payer: Self-pay

## 2019-09-20 ENCOUNTER — Encounter: Payer: Self-pay | Admitting: Nurse Practitioner

## 2019-09-20 VITALS — BP 137/77 | HR 72 | Temp 97.5°F | Resp 18 | Ht 61.0 in | Wt 100.0 lb

## 2019-09-20 DIAGNOSIS — D500 Iron deficiency anemia secondary to blood loss (chronic): Secondary | ICD-10-CM

## 2019-09-20 DIAGNOSIS — C189 Malignant neoplasm of colon, unspecified: Secondary | ICD-10-CM

## 2019-09-20 LAB — CBC WITH DIFFERENTIAL (CANCER CENTER ONLY)
Abs Immature Granulocytes: 0.01 10*3/uL (ref 0.00–0.07)
Basophils Absolute: 0.1 10*3/uL (ref 0.0–0.1)
Basophils Relative: 1 %
Eosinophils Absolute: 0.2 10*3/uL (ref 0.0–0.5)
Eosinophils Relative: 3 %
HCT: 29.1 % — ABNORMAL LOW (ref 36.0–46.0)
Hemoglobin: 9.3 g/dL — ABNORMAL LOW (ref 12.0–15.0)
Immature Granulocytes: 0 %
Lymphocytes Relative: 35 %
Lymphs Abs: 2.1 10*3/uL (ref 0.7–4.0)
MCH: 24.7 pg — ABNORMAL LOW (ref 26.0–34.0)
MCHC: 32 g/dL (ref 30.0–36.0)
MCV: 77.4 fL — ABNORMAL LOW (ref 80.0–100.0)
Monocytes Absolute: 0.7 10*3/uL (ref 0.1–1.0)
Monocytes Relative: 11 %
Neutro Abs: 2.9 10*3/uL (ref 1.7–7.7)
Neutrophils Relative %: 50 %
Platelet Count: 380 10*3/uL (ref 150–400)
RBC: 3.76 MIL/uL — ABNORMAL LOW (ref 3.87–5.11)
RDW: 25.2 % — ABNORMAL HIGH (ref 11.5–15.5)
WBC Count: 5.8 10*3/uL (ref 4.0–10.5)
nRBC: 0 % (ref 0.0–0.2)

## 2019-09-20 LAB — CMP (CANCER CENTER ONLY)
ALT: 10 U/L (ref 0–44)
AST: 12 U/L — ABNORMAL LOW (ref 15–41)
Albumin: 3.6 g/dL (ref 3.5–5.0)
Alkaline Phosphatase: 63 U/L (ref 38–126)
Anion gap: 8 (ref 5–15)
BUN: 8 mg/dL (ref 6–20)
CO2: 25 mmol/L (ref 22–32)
Calcium: 9.2 mg/dL (ref 8.9–10.3)
Chloride: 105 mmol/L (ref 98–111)
Creatinine: 0.69 mg/dL (ref 0.44–1.00)
GFR, Est AFR Am: >60 mL/min (ref >60)
GFR, Estimated: >60 mL/min (ref >60)
Glucose, Bld: 92 mg/dL (ref 70–99)
Potassium: 4.2 mmol/L (ref 3.5–5.1)
Sodium: 138 mmol/L (ref 135–145)
Total Bilirubin: 1.2 mg/dL (ref 0.3–1.2)
Total Protein: 7.6 g/dL (ref 6.5–8.1)

## 2019-09-20 NOTE — Progress Notes (Signed)
  [place] OFFICE PROGRESS NOTE   Diagnosis: Appendiceal carcinoma  INTERVAL HISTORY:   Shirley White returns as scheduled.  She completed cycle 2 adjuvant Xeloda beginning 09/05/2019.  She denies nausea/vomiting.  No mouth sores.  No diarrhea.  No hand or foot pain or redness.  For the past 4 to 5 days she has noted a sore throat and cough.  No shortness of breath.  No fever.  She is not taking oral iron consistently, estimating 1 pill a week.  Objective:  Vital signs in last 24 hours:  Blood pressure 137/77, pulse 72, temperature (!) 97.5 F (36.4 C), temperature source Temporal, resp. rate 18, height _0  (1.549 m), weight 100 lb (45.4 kg), SpO2 100 %.    HEENT: No thrush or ulcers.  Pharynx is without erythema or exudate. Resp: Lungs clear bilaterally. Cardio: Regular rate and rhythm. GI: Abdomen soft.  Mild tenderness at the right abdomen.  No hepatomegaly. Vascular: No leg edema. Skin: Palms without erythema.   Lab Results:  Lab Results  Component Value Date   WBC 5.8 09/20/2019   HGB 9.3 (L) 09/20/2019   HCT 29.1 (L) 09/20/2019   MCV 77.4 (L) 09/20/2019   PLT 380 09/20/2019   NEUTROABS 2.9 09/20/2019    Imaging:  No results found.  Medications: I have reviewed the patient's current medications.  Assessment/Plan: 1. 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 Right colectomy 07/14/2019, T3N0 tumor, 0/35 lymph nodes, no loss of mismatch repair protein expression, no macroscopic tumor perforation, focal lymphovascular invasion ? CT abdomen/pelvis 07/13/2019-masslike thickening of the appendix with dilatation of the tip with mucosal enhancement, uterine fibroids, subcentimeter right lower quadrant mesenteric nodes ? Cycle 1 Xeloda 08/15/2019 ? Cycle 2 Xeloda 09/05/2019 ? Cycle 3 Xeloda 09/26/2019 2. HIV positive 3. Hypertension 4. Family history of colon and prostate cancer 5. Gastroesophageal reflux disease 6. Bilateral foot  numbness/pain-gabapentin prescribed 7. Iron deficiency anemia-on oral iron  Disposition: Shirley White appears stable.  She has completed 2 cycles of adjuvant Xeloda.  Plan to proceed with cycle 3 beginning 09/26/2019.  We reviewed the CBC from today.  She has a progressive microcytic anemia.  We will follow-up on the ferritin.  We discussed IV iron which she has had in the past.  She will return for lab and follow-up in 3 weeks.  She will contact the office in the interim with any problems.  Ned Card ANP/GNP-BC   09/20/2019  2:49 PM

## 2019-09-21 ENCOUNTER — Telehealth: Payer: Self-pay

## 2019-09-21 ENCOUNTER — Telehealth: Payer: Self-pay | Admitting: Oncology

## 2019-09-21 LAB — FERRITIN: Ferritin: <4 ng/mL — ABNORMAL LOW (ref 11–307)

## 2019-09-21 NOTE — Telephone Encounter (Signed)
Scheduled per 6/8 los. Pt aware of appts on 6/29

## 2019-09-21 NOTE — Telephone Encounter (Signed)
TC to pt per Ned Card NP to let her know that her ferritin level is low which is consistent with iron deficiency. I let her know that Lattie Haw recommend Feraheme weekly x2. Patient stated that she would like to schedule this. I made Lattie Haw aware so that she could   put the orders in. Schedule message sent

## 2019-09-22 ENCOUNTER — Ambulatory Visit: Payer: Self-pay | Admitting: Infectious Diseases

## 2019-09-22 ENCOUNTER — Telehealth: Payer: Self-pay | Admitting: Oncology

## 2019-09-22 NOTE — Telephone Encounter (Signed)
Scheduled appt per 6/9 sch message - pt is aware of appts.

## 2019-09-27 ENCOUNTER — Inpatient Hospital Stay: Payer: Self-pay

## 2019-10-04 ENCOUNTER — Other Ambulatory Visit: Payer: Self-pay | Admitting: Oncology

## 2019-10-04 ENCOUNTER — Other Ambulatory Visit: Payer: Self-pay

## 2019-10-04 ENCOUNTER — Inpatient Hospital Stay: Payer: Self-pay

## 2019-10-04 VITALS — BP 136/80 | HR 79 | Temp 98.7°F | Resp 16

## 2019-10-04 DIAGNOSIS — D500 Iron deficiency anemia secondary to blood loss (chronic): Secondary | ICD-10-CM

## 2019-10-04 MED ORDER — SODIUM CHLORIDE 0.9 % IV SOLN
510.0000 mg | Freq: Once | INTRAVENOUS | Status: AC
  Administered 2019-10-04: 510 mg via INTRAVENOUS
  Filled 2019-10-04: qty 510

## 2019-10-04 MED ORDER — SODIUM CHLORIDE 0.9 % IV SOLN
Freq: Once | INTRAVENOUS | Status: AC
  Filled 2019-10-04: qty 250

## 2019-10-04 NOTE — Patient Instructions (Signed)

## 2019-10-05 IMAGING — CT CT ABDOMEN AND PELVIS WITHOUT CONTRAST
2 of 4 series · 16 of 46 positions shown, 18 images · non-contrast
Comparison: None.

CLINICAL DATA: Left-sided flank pain, stone disease suspected
versus pyelonephritis

EXAM:
CT ABDOMEN AND PELVIS WITHOUT CONTRAST
TECHNIQUE: Multidetector CT imaging of the abdomen and pelvis was performed
following the standard protocol without IV contrast.

[Series 3: a/p w/o 5mm · axial · non-contrast · 0.62mm/px · z∈[-407,+13]mm · 13 of 92 slices shown, 15 images]
[im 4/92  soft-tissue]
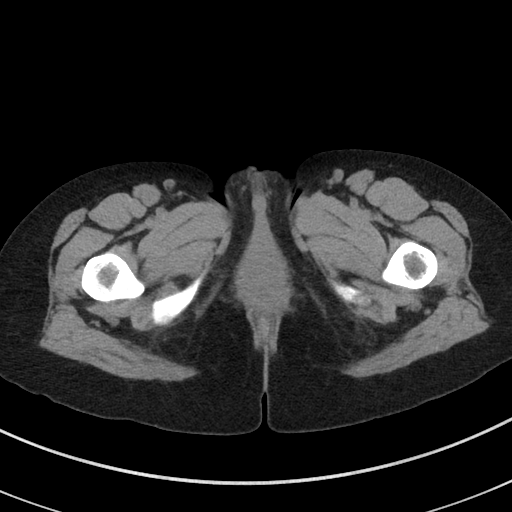
[im 4/92  bone]
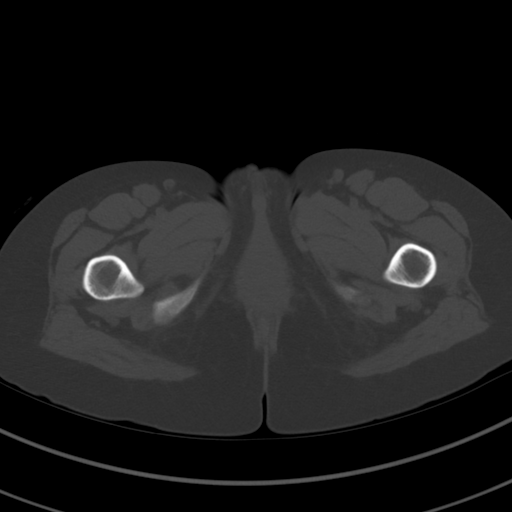
[im 11/92  soft-tissue]
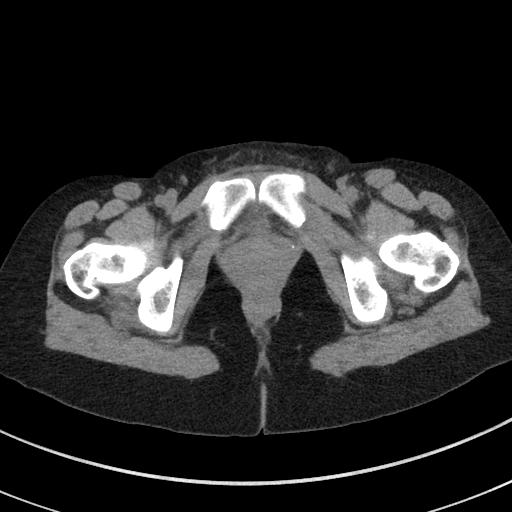
[im 19/92  soft-tissue]
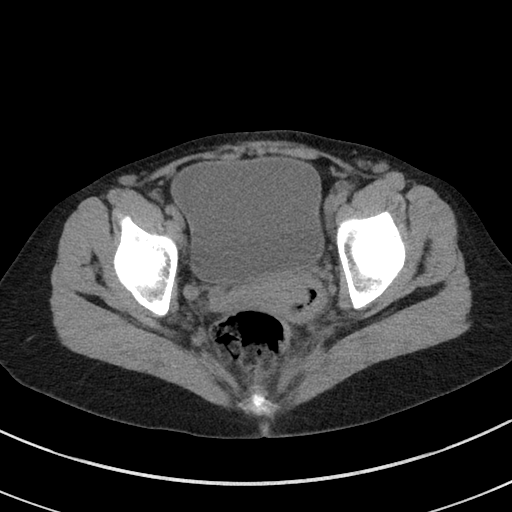
[im 26/92  soft-tissue]
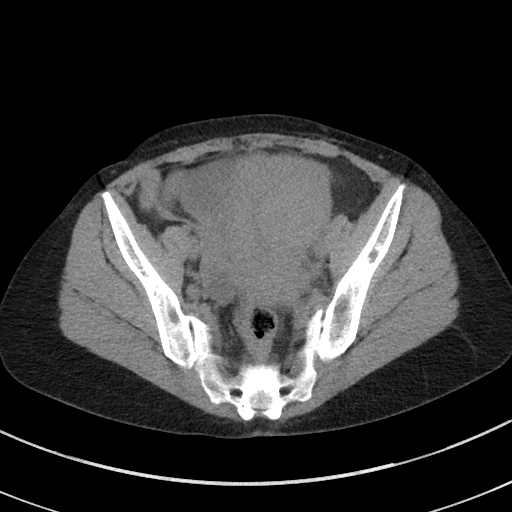
[im 33/92  soft-tissue]
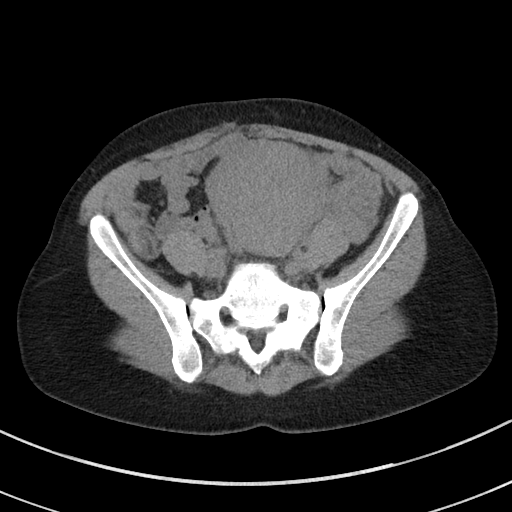
[im 41/92  soft-tissue]
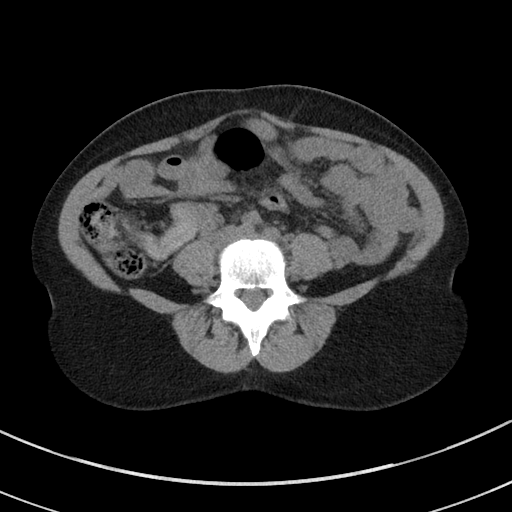
[im 48/92  soft-tissue]
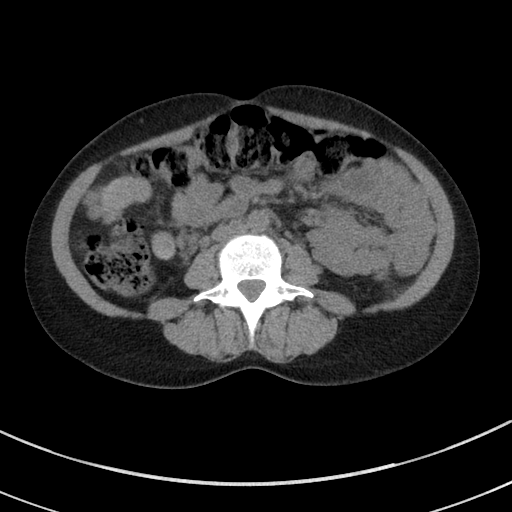
[im 51/92  soft-tissue]
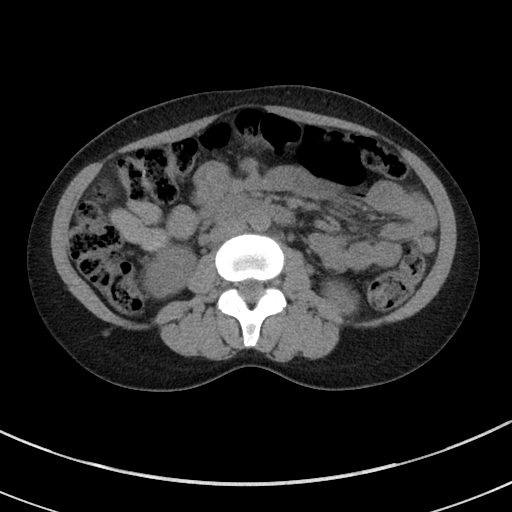
[im 59/92  soft-tissue]
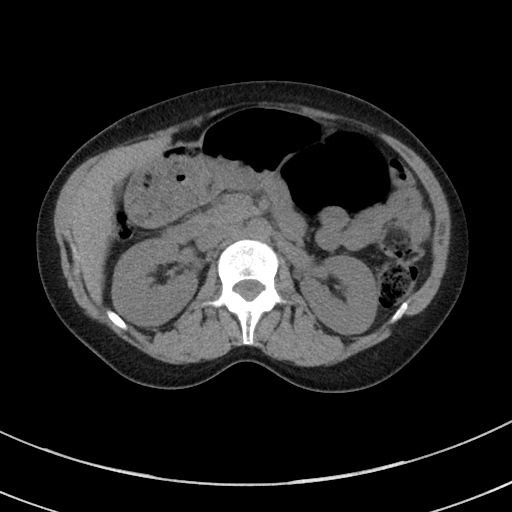
[im 59/92  bone]
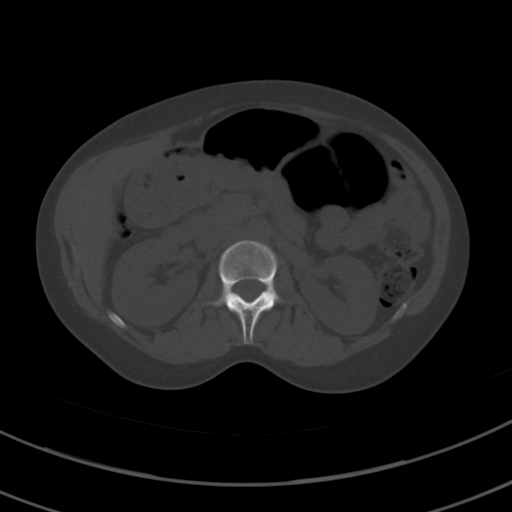
[im 66/92  soft-tissue]
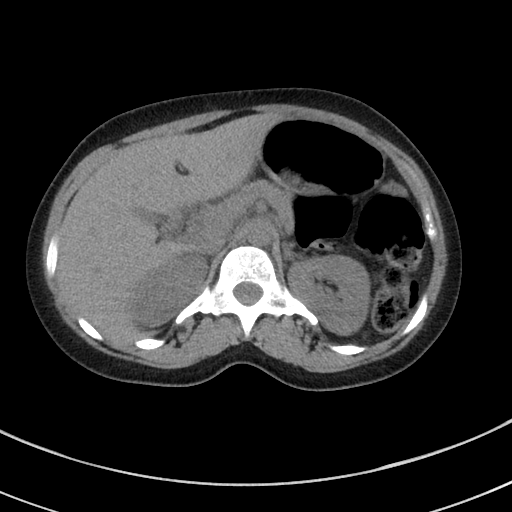
[im 73/92  soft-tissue]
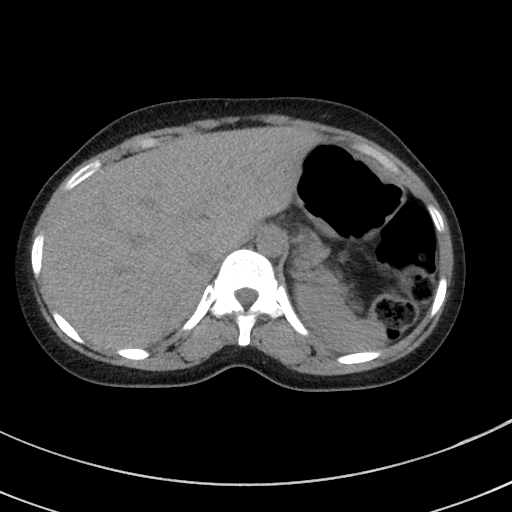
[im 81/92  soft-tissue]
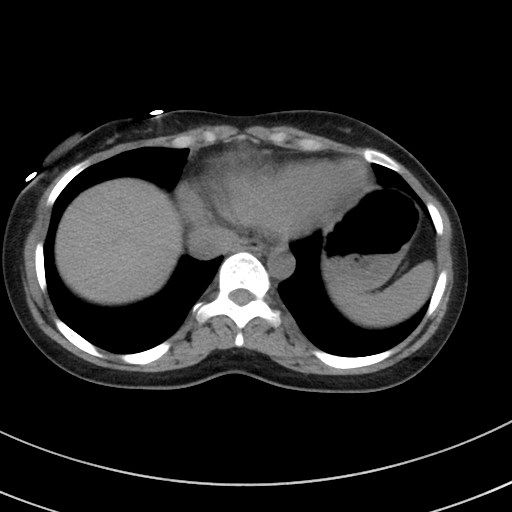
[im 88/92  soft-tissue]
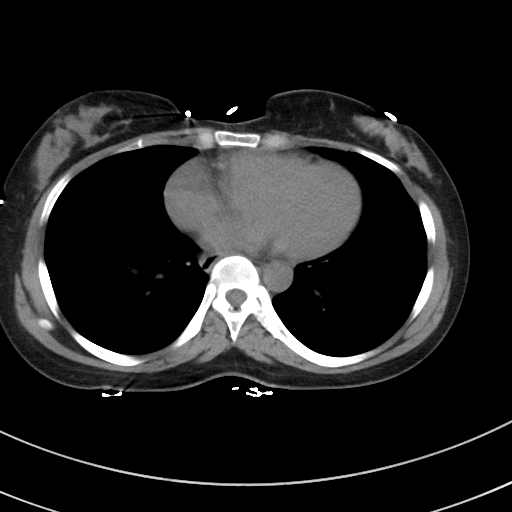

[Series 6: a/p w/o cor · coronal · non-contrast · 0.64mm/px · 3 of 116 slices shown]
[im 39/116  soft-tissue]
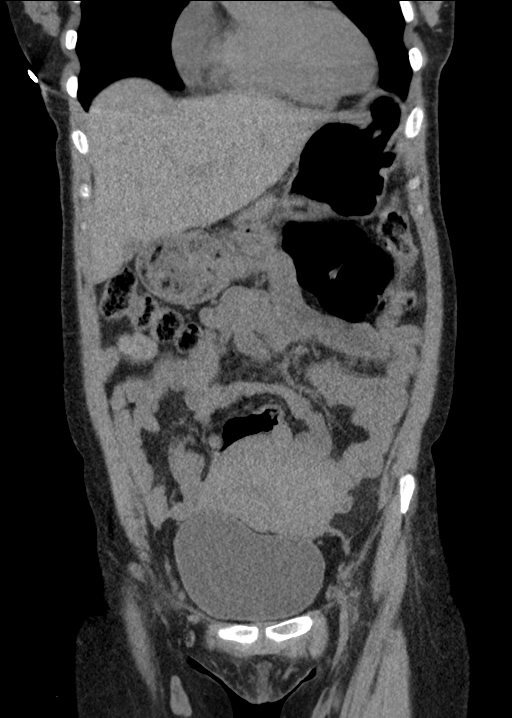
[im 52/116  soft-tissue]
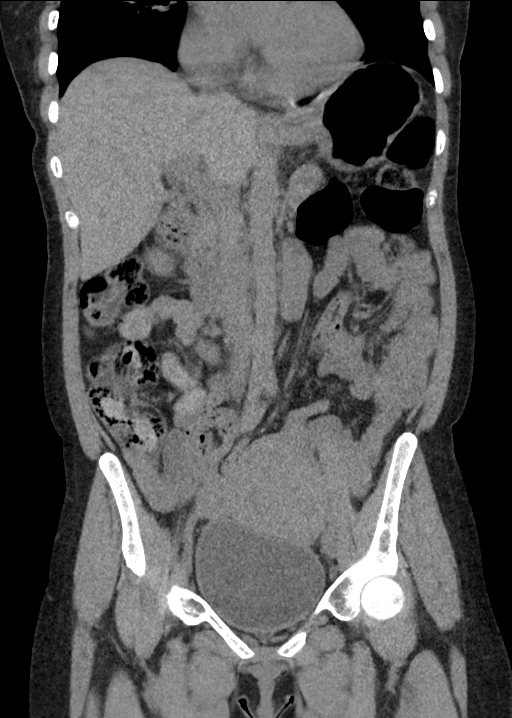
[im 64/116  soft-tissue]
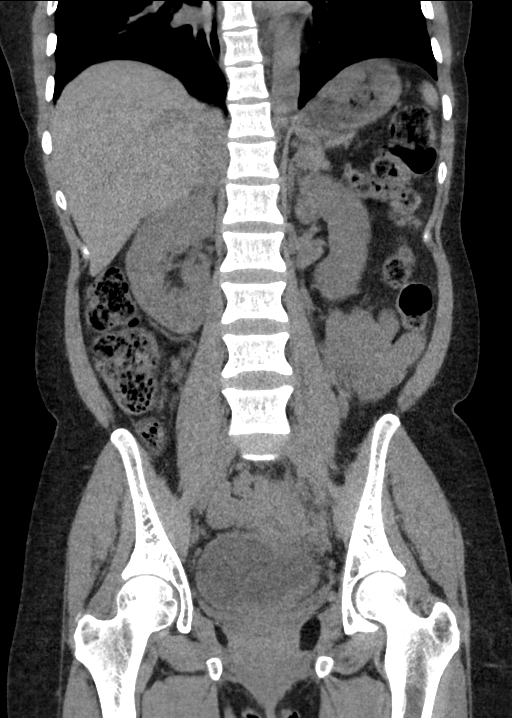

[16 of 46 positions shown; findings below may reference images not displayed]

FINDINGS: Lower chest: No acute abnormality.

Hepatobiliary: No solid liver abnormality is seen. No gallstones,
gallbladder wall thickening, or biliary dilatation.

Pancreas: Unremarkable. No pancreatic ductal dilatation or
surrounding inflammatory changes.

Spleen: Normal in size without significant abnormality.

Adrenals/Urinary Tract: Adrenal glands are unremarkable. Kidneys are
normal, without renal calculi, solid lesion, or hydronephrosis.
Bladder is unremarkable.

Stomach/Bowel: Stomach is within normal limits. Appendix appears
normal. No evidence of bowel wall thickening, distention, or
inflammatory changes.

Vascular/Lymphatic: No significant vascular findings are present. No
enlarged abdominal or pelvic lymph nodes.

Reproductive: Large fibroid of the left posterior uterine fundus.

Other: No abdominal wall hernia or abnormality. No abdominopelvic
ascites.

Musculoskeletal: No acute or significant osseous findings.
IMPRESSION: No CT evidence of urinary tract calculus or hydronephrosis.
Evaluation for pyelonephritis is limited by lack of intravenous
contrast, however there is no obvious perinephric fat stranding or
other abnormality of the kidneys.

These results will be called to the ordering clinician or
representative by the Radiologist Assistant, and communication
documented in the PACS or zVision Dashboard.

## 2019-10-11 ENCOUNTER — Inpatient Hospital Stay (HOSPITAL_BASED_OUTPATIENT_CLINIC_OR_DEPARTMENT_OTHER): Payer: Self-pay | Admitting: Oncology

## 2019-10-11 ENCOUNTER — Inpatient Hospital Stay: Payer: Self-pay

## 2019-10-11 ENCOUNTER — Other Ambulatory Visit: Payer: Self-pay | Admitting: *Deleted

## 2019-10-11 ENCOUNTER — Other Ambulatory Visit: Payer: Self-pay

## 2019-10-11 ENCOUNTER — Telehealth: Payer: Self-pay | Admitting: *Deleted

## 2019-10-11 VITALS — BP 167/83 | HR 96 | Temp 98.3°F | Resp 17 | Ht 61.0 in | Wt 103.1 lb

## 2019-10-11 DIAGNOSIS — C189 Malignant neoplasm of colon, unspecified: Secondary | ICD-10-CM

## 2019-10-11 LAB — CBC WITH DIFFERENTIAL (CANCER CENTER ONLY)
Abs Immature Granulocytes: 0.01 10*3/uL (ref 0.00–0.07)
Basophils Absolute: 0.1 10*3/uL (ref 0.0–0.1)
Basophils Relative: 2 %
Eosinophils Absolute: 0.2 10*3/uL (ref 0.0–0.5)
Eosinophils Relative: 3 %
HCT: 30.7 % — ABNORMAL LOW (ref 36.0–46.0)
Hemoglobin: 9.6 g/dL — ABNORMAL LOW (ref 12.0–15.0)
Immature Granulocytes: 0 %
Lymphocytes Relative: 30 %
Lymphs Abs: 1.6 10*3/uL (ref 0.7–4.0)
MCH: 25.5 pg — ABNORMAL LOW (ref 26.0–34.0)
MCHC: 31.3 g/dL (ref 30.0–36.0)
MCV: 81.6 fL (ref 80.0–100.0)
Monocytes Absolute: 0.5 10*3/uL (ref 0.1–1.0)
Monocytes Relative: 10 %
Neutro Abs: 3 10*3/uL (ref 1.7–7.7)
Neutrophils Relative %: 55 %
Platelet Count: 402 10*3/uL — ABNORMAL HIGH (ref 150–400)
RBC: 3.76 MIL/uL — ABNORMAL LOW (ref 3.87–5.11)
RDW: 25 % — ABNORMAL HIGH (ref 11.5–15.5)
WBC Count: 5.4 10*3/uL (ref 4.0–10.5)
nRBC: 0.6 % — ABNORMAL HIGH (ref 0.0–0.2)

## 2019-10-11 LAB — CMP (CANCER CENTER ONLY)
ALT: 9 U/L (ref 0–44)
AST: 12 U/L — ABNORMAL LOW (ref 15–41)
Albumin: 3.4 g/dL — ABNORMAL LOW (ref 3.5–5.0)
Alkaline Phosphatase: 66 U/L (ref 38–126)
Anion gap: 9 (ref 5–15)
BUN: 8 mg/dL (ref 6–20)
CO2: 25 mmol/L (ref 22–32)
Calcium: 8.8 mg/dL — ABNORMAL LOW (ref 8.9–10.3)
Chloride: 106 mmol/L (ref 98–111)
Creatinine: 0.68 mg/dL (ref 0.44–1.00)
GFR, Est AFR Am: >60 mL/min (ref >60)
GFR, Estimated: >60 mL/min (ref >60)
Glucose, Bld: 130 mg/dL — ABNORMAL HIGH (ref 70–99)
Potassium: 3.4 mmol/L — ABNORMAL LOW (ref 3.5–5.1)
Sodium: 140 mmol/L (ref 135–145)
Total Bilirubin: 0.9 mg/dL (ref 0.3–1.2)
Total Protein: 7.4 g/dL (ref 6.5–8.1)

## 2019-10-11 MED ORDER — FERROUS SULFATE 325 (65 FE) MG PO TBEC: 325.0000 mg | DELAYED_RELEASE_TABLET | Freq: Two times a day (BID) | ORAL | 3 refills | Status: DC

## 2019-10-11 MED ORDER — CAPECITABINE 500 MG PO TABS: ORAL_TABLET | ORAL | 0 refills | Status: DC

## 2019-10-11 NOTE — Telephone Encounter (Signed)
Per Dr. Benay Spice: She should be on ferrous sulfate 325 mg bid. Patient notified and she agrees to take iron bid. Has taken in past without issue.

## 2019-10-11 NOTE — Progress Notes (Signed)
  [facility] OFFICE PROGRESS NOTE   Diagnosis: Appendiceal carcinoma  INTERVAL HISTORY:   Shirley White returns as scheduled.  She began another cycle of adjuvant Xeloda on 09/26/2019.  No mouth sores, nausea, diarrhea, or hand/foot pain.  She feels well.  She reports tenderness at a "knot "near the surgical incision  Objective:  Vital signs in last 24 hours:  Blood pressure (!) 167/83, pulse 96, temperature 98.3 F (36.8 C), resp. rate 17, height 5' 1" (1.549 m), weight 103 lb 1.6 oz (46.8 kg), SpO2 100 %.    HEENT: No thrush or ulcers Resp: Lungs clear bilaterally Cardio: Regular rate and rhythm GI: No hepatosplenomegaly, no mass, palpable abdominal wall suture to the right of midline at the upper aspect of the midline incision, no erythema or drainage Vascular: No leg edema  Skin: Dryness of the palms, dryness with superficial desquamation at the soles.  No erythema.    Lab Results:  Lab Results  Component Value Date   WBC 5.4 10/11/2019   HGB 9.6 (L) 10/11/2019   HCT 30.7 (L) 10/11/2019   MCV 81.6 10/11/2019   PLT 402 (H) 10/11/2019   NEUTROABS 3.0 10/11/2019    CMP  Lab Results  Component Value Date   NA 140 10/11/2019   K 3.4 (L) 10/11/2019   CL 106 10/11/2019   CO2 25 10/11/2019   GLUCOSE 130 (H) 10/11/2019   BUN 8 10/11/2019   CREATININE 0.68 10/11/2019   CALCIUM 8.8 (L) 10/11/2019   PROT 7.4 10/11/2019   ALBUMIN 3.4 (L) 10/11/2019   AST 12 (L) 10/11/2019   ALT 9 10/11/2019   ALKPHOS 66 10/11/2019   BILITOT 0.9 10/11/2019   GFRNONAA >60 10/11/2019   GFRAA >60 10/11/2019    Lab Results  Component Value Date   CEA1 1.5 07/19/2019    Medications: I have reviewed the patient's current medications.   Assessment/Plan: 1. 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 Right colectomy 07/14/2019, T3N0 tumor, 0/35 lymph nodes, no loss of mismatch repair protein expression, no macroscopic tumor perforation, focal  lymphovascular invasion ? CT abdomen/pelvis 07/13/2019-masslike thickening of the appendix with dilatation of the tip with mucosal enhancement, uterine fibroids, subcentimeter right lower quadrant mesenteric nodes ? Cycle 1 Xeloda 08/15/2019 ? Cycle 2 Xeloda 09/05/2019 ? Cycle 3 Xeloda 09/26/2019 ? Cycle 4 Xeloda 10/17/2019 2. HIV positive 3. Hypertension 4. Family history of colon and prostate cancer 5. Gastroesophageal reflux disease 6. Bilateral foot numbness/pain-gabapentin prescribed 7. Iron deficiency anemia-on oral iron    Disposition: [name] has completed 3 cycles of Xeloda.  She is tolerating Xeloda well.  She will complete cycle 4 beginning 10/17/2019.  She has persistent anemia secondary to iron deficiency and Xeloda.  We will be sure she is taking oral iron.  We will consider IV iron therapy if the hemoglobin falls.  [name] will return for an office visit in 3 weeks.  Betsy Coder, MD  10/11/2019  1:36 PM

## 2019-10-12 ENCOUNTER — Telehealth: Payer: Self-pay | Admitting: Oncology

## 2019-10-12 ENCOUNTER — Ambulatory Visit (INDEPENDENT_AMBULATORY_CARE_PROVIDER_SITE_OTHER): Payer: Self-pay | Admitting: Infectious Diseases

## 2019-10-12 ENCOUNTER — Encounter: Payer: Self-pay | Admitting: Infectious Diseases

## 2019-10-12 ENCOUNTER — Telehealth: Payer: Self-pay | Admitting: Infectious Diseases

## 2019-10-12 ENCOUNTER — Other Ambulatory Visit: Payer: Self-pay | Admitting: Pharmacist

## 2019-10-12 VITALS — BP 125/80 | HR 74 | Wt 102.2 lb

## 2019-10-12 DIAGNOSIS — G629 Polyneuropathy, unspecified: Secondary | ICD-10-CM

## 2019-10-12 DIAGNOSIS — Z21 Asymptomatic human immunodeficiency virus [HIV] infection status: Secondary | ICD-10-CM

## 2019-10-12 DIAGNOSIS — D500 Iron deficiency anemia secondary to blood loss (chronic): Secondary | ICD-10-CM

## 2019-10-12 DIAGNOSIS — C189 Malignant neoplasm of colon, unspecified: Secondary | ICD-10-CM

## 2019-10-12 MED ORDER — CAPECITABINE 500 MG PO TABS: ORAL_TABLET | ORAL | 0 refills | Status: DC

## 2019-10-12 NOTE — Telephone Encounter (Signed)
Opened in error

## 2019-10-12 NOTE — Telephone Encounter (Signed)
Scheduled appts per 6/29 los. Pt confirmed appt date and times.

## 2019-10-12 NOTE — Patient Instructions (Addendum)
So nice to see you!  Please stop by the lab on your way out.   Please continue your biktarvy every day.   For your feet pain -  1. Continue your night time gabapentin pill 2. Add a dose during the day also - if this makes you too sleepy send me a MyChart and we will work out another plan.   Would like to see you back in 4 months - feel free to bring [place] :)

## 2019-10-12 NOTE — Progress Notes (Signed)
Name: Shirley White  DOB: 07-14-1970 MRN: [id number] PCP: Patient, No Pcp Per    Patient Active Problem List   Diagnosis Date Noted  . HIV (human immunodeficiency virus infection) ([place]) 07/14/2018    Priority: High  . Neuropathy 08/19/2019  . Hypertension 08/19/2019  . 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 Adenocarcinoma, colon ([place]) 07/20/2019  . 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 Thrombocytopenia ([place]) 12/12/2017  . Routine health maintenance 08/28/2016  . H/O menorrhagia 09/05/2014  . Iron deficiency anemia 09/05/2014  . Sickle-cell trait ([place]) 06/04/2006     Brief Narrative:  Shirley White is a 49 y.o. woman with HIV disease originally diagnosed in 2005. Poor adherence with AIDS qualifying CD4s since 2016 with CD4 nadir 30.  HIV Risk: heterosexual.  History of OIs: esophageal candidiasis.   Previous Regimens: . Complera 2011 >> difficulty swallowing regimen . Emtriva + Edurant + Viread 2011 through 01-2014 . Odefsey 2016 . Tiviay + Truvada 2018  . Biktarvy 2019  Genotypes: . 11-6759 - sensitive . 12-5091 - sensitive  .   Subjective:  CC: HIV follow up care.  Ongoing nerve pain in feet.    HPI: She has been doing very well on her chemotherapy treatments and states she has about 6 treatments left. She has not had any side effects that have been significant for her. Recently received iron transfusion and has started back taking her pills twice daily. She is planning on returning to work part time at [employer] soon. She has put on some weight which she is happy about.   Has missed a few doses of Biktarvy but overall has continued to get them in 90% of the time. No trouble with access to her medication or side effects.   Still having burning pains in feet. The gabapentin at night does help significantly but makes her sleepy. She is only taking this once a day currently. Has not worsened since starting chemo.    Review of Systems    Constitutional: Negative for appetite change, chills, fatigue, fever and unexpected weight change.  HENT: Negative for mouth sores, sore throat and trouble swallowing.   Eyes: Negative for pain and visual disturbance.  Respiratory: Negative for cough and shortness of breath.   Cardiovascular: Negative for chest pain.  Gastrointestinal: Negative for abdominal pain, diarrhea and nausea.  Genitourinary: Negative for dysuria, menstrual problem and pelvic pain.  Musculoskeletal: Negative for back pain and neck pain.  Skin: Negative for color change and rash.  Neurological: Positive for numbness (burning numbness to b/l feet). Negative for weakness and headaches.  Hematological: Negative for adenopathy.  Psychiatric/Behavioral: Negative for dysphoric mood. The patient is not nervous/anxious.      Past Medical History:  Diagnosis Date  . Anemia   . GERD (gastroesophageal reflux disease)    no meds  . Heart murmur   . HIV disease [facility])     Outpatient Medications Prior to Visit  Medication Sig Dispense Refill  . acetaminophen (TYLENOL) 325 MG tablet Take 2 tablets (650 mg total) by mouth every 6 (six) hours as needed.    Marland Kitchen amLODipine (NORVASC) 10 MG tablet Take 1 tablet (10 mg total) by mouth daily. Follow up with PCP to refill or adjust 30 tablet 0  . BIKTARVY 50-200-25 MG TABS tablet TAKE 1 TABLET BY MOUTH DAILY, TO TAKE AT THE SAME TIME EACH DAY WITH OR WITHOUT FOOD 30 tablet 5  . [START ON 10/17/2019] capecitabine (XELODA) 500  MG tablet Take 3 tablets by mouth in AM and 2 tablets in PM. Take with food. Take for 14 days, then hold for 7 days. Repeat every 21 days. 70 tablet 0  . ferrous sulfate 325 (65 FE) MG EC tablet Take 1 tablet (325 mg total) by mouth 2 (two) times daily with a meal.  3  . gabapentin (NEURONTIN) 300 MG capsule Take 1 capsule (300 mg total) by mouth 3 (three) times daily. 90 capsule 3  . lisinopril (ZESTRIL) 5 MG tablet Take 1 tablet (5 mg total) by mouth daily. Follow  up with PCP to refill or adjust 30 tablet 3  . oxyCODONE (OXY IR/ROXICODONE) 5 MG immediate release tablet Take 1 tablet (5 mg total) by mouth every 8 (eight) hours as needed for moderate pain. 10 tablet 0  . prochlorperazine (COMPAZINE) 10 MG tablet Take 1 tablet (10 mg total) by mouth every 6 (six) hours as needed for nausea. 30 tablet 1   No facility-administered medications prior to visit.     No Known Allergies  Social History   Tobacco Use  . Smoking status: Never Smoker  . Smokeless tobacco: Never Used  Vaping Use  . Vaping Use: Never used  Substance Use Topics  . Alcohol use: Yes    Alcohol/week: 1.0 standard drink    Types: 1 Cans of beer per week    Comment: occa  . Drug use: Not Currently    Types: Marijuana    Social History   Substance and Sexual Activity  Sexual Activity Not Currently  . Partners: Male  . Birth control/protection: Condom   Comment: given condoms     Objective:   Vitals:   10/12/19 1556  BP: 125/80  Pulse: 74  SpO2: 98%  Weight: 102 lb 3.2 oz (46.4 kg)   Body mass index is 19.31 kg/m.   Physical Exam Vitals reviewed.  Constitutional:      Comments: Well appearing and in good spirits today  HENT:     Mouth/Throat:     Mouth: No oral lesions.     Dentition: No dental abscesses.  Cardiovascular:     Rate and Rhythm: Normal rate.     Heart sounds: No murmur heard.   Pulmonary:     Effort: Pulmonary effort is normal. No respiratory distress.  Abdominal:     General: There is no distension.     Palpations: Abdomen is soft.     Tenderness: There is no abdominal tenderness.  Musculoskeletal:        General: No tenderness. Normal range of motion.  Skin:    General: Skin is warm and dry.     Findings: No rash.  Neurological:     Mental Status: She is alert and oriented to person, place, and time.  Psychiatric:        Judgment: Judgment normal.      Lab Results Lab Results  Component Value Date   WBC 5.4 10/11/2019     HGB 9.6 (L) 10/11/2019   HCT 30.7 (L) 10/11/2019   MCV 81.6 10/11/2019   PLT 402 (H) 10/11/2019    Lab Results  Component Value Date   CREATININE 0.68 10/11/2019   BUN 8 10/11/2019   NA 140 10/11/2019   K 3.4 (L) 10/11/2019   CL 106 10/11/2019   CO2 25 10/11/2019    Lab Results  Component Value Date   ALT 9 10/11/2019   AST 12 (L) 10/11/2019   ALKPHOS 66 10/11/2019  BILITOT 0.9 10/11/2019    Lab Results  Component Value Date   CHOL 143 08/21/2015   HDL 61 08/21/2015   LDLCALC 70 08/21/2015   TRIG 62 08/21/2015   CHOLHDL 2.3 08/21/2015   HIV 1 RNA Quant (copies/mL)  Date Value  06/23/2019 <20 NOT DETECTED  02/24/2019 <20 NOT DETECTED  10/18/2018 <20 NOT DETECTED   CD4 T Cell Abs (/uL)  Date Value  06/23/2019 347 (L)  02/24/2019 309 (L)  10/18/2018 154 (L)     Assessment & Plan:   Problem List Items Addressed This Visit      High   HIV (human immunodeficiency virus infection) ([place]) - Primary (Chronic)    Shirley White has continued to do very well on Biktarvy. Will continue this for her. ADAP due to be updated in July. Will update viral load and CD4 today - explained that we can sometimes see the CD4 counts drop while undergoing chemotherapy and it is not reflective of poor control over HIV.  If < 100 will consider adding back bactrim for PJP prophylaxis.  Return in about 4 months (around 02/11/2020).       Relevant Orders   HIV-1 RNA quant-no reflex-bld   T-helper cell (CD4)- ([facility] clinic only)     Unprioritized   Iron deficiency anemia    Counseled on timing re: iron and Biktarvy to avoid chelation effect of the Biktarvy.  She has a good plan for timing of her medication administration.       Neuropathy    Will see if she can tolerate gabapentin 300 mg BID and titrate to TID if needed.  Will plan lower dose in the morning/afternoon if she is too sleepy with the 300 mg dose - she will update me via [place].          Janene Madeira, MSN,  NP-C [facility] Pager: [phone] Office: [phone]  10/13/19  9:23 AM

## 2019-10-13 LAB — T-HELPER CELL (CD4) - (RCID CLINIC ONLY)
CD4 % Helper T Cell: 19 % — ABNORMAL LOW (ref 33–65)
CD4 T Cell Abs: 328 /uL — ABNORMAL LOW (ref 400–1790)

## 2019-10-13 NOTE — Assessment & Plan Note (Addendum)
Shirley White has continued to do very well on [place]. Will continue this for her. ADAP due to be updated in July. Will update viral load and CD4 today - explained that we can sometimes see the CD4 counts drop while undergoing chemotherapy and it is not reflective of poor control over HIV.  If < 100 will consider adding back bactrim for PJP prophylaxis.  Return in about 4 months (around 02/11/2020).

## 2019-10-13 NOTE — Assessment & Plan Note (Signed)
Will see if she can tolerate gabapentin 300 mg BID and titrate to TID if needed.  Will plan lower dose in the morning/afternoon if she is too sleepy with the 300 mg dose - she will update me via [place].

## 2019-10-13 NOTE — Assessment & Plan Note (Signed)
Counseled on timing re: iron and Biktarvy to avoid chelation effect of the Biktarvy.  She has a good plan for timing of her medication administration.

## 2019-10-15 LAB — HIV-1 RNA QUANT-NO REFLEX-BLD
HIV 1 RNA Quant: <20 copies/mL
HIV-1 RNA Quant, Log: <1.3 Log copies/mL

## 2019-11-01 ENCOUNTER — Inpatient Hospital Stay: Payer: Self-pay | Admitting: Nurse Practitioner

## 2019-11-01 ENCOUNTER — Inpatient Hospital Stay: Payer: Self-pay

## 2019-11-02 ENCOUNTER — Telehealth: Payer: Self-pay | Admitting: Nurse Practitioner

## 2019-11-02 ENCOUNTER — Inpatient Hospital Stay
Admission: RE | Admit: 2019-11-02 | Discharge: 2019-11-02 | Disposition: A | Discharge status: Home / Self Care | Payer: Self-pay | Source: Ambulatory Visit

## 2019-11-02 ENCOUNTER — Inpatient Hospital Stay: Admission: RE | Admit: 2019-11-02 | Payer: Self-pay | Source: Ambulatory Visit

## 2019-11-02 NOTE — Telephone Encounter (Signed)
Scheduled per 7/20 sch message. Pt is aware of appt time and date.

## 2019-11-04 ENCOUNTER — Telehealth: Payer: Self-pay | Admitting: Nurse Practitioner

## 2019-11-04 ENCOUNTER — Inpatient Hospital Stay: Payer: Self-pay | Admitting: Nurse Practitioner

## 2019-11-04 ENCOUNTER — Inpatient Hospital Stay: Payer: Self-pay

## 2019-11-04 NOTE — Telephone Encounter (Signed)
Called pt per 7/23 sch msg to reschedule. Pt says she will call back to reschedule appt.

## 2019-11-17 ENCOUNTER — Telehealth: Payer: Self-pay | Admitting: *Deleted

## 2019-11-17 DIAGNOSIS — C189 Malignant neoplasm of colon, unspecified: Secondary | ICD-10-CM

## 2019-11-17 MED ORDER — CAPECITABINE 500 MG PO TABS: ORAL_TABLET | ORAL | 0 refills | Status: DC

## 2019-11-17 NOTE — Telephone Encounter (Signed)
Called patient to inquire if she is ready to schedule her lab/OV to get back on her Xeloda treatment. She agrees to come on 11/28/19.

## 2019-11-28 ENCOUNTER — Inpatient Hospital Stay: Payer: Self-pay | Attending: Oncology | Admitting: Nurse Practitioner

## 2019-11-28 ENCOUNTER — Encounter: Payer: Self-pay | Admitting: Nurse Practitioner

## 2019-11-28 ENCOUNTER — Other Ambulatory Visit: Payer: Self-pay

## 2019-11-28 ENCOUNTER — Inpatient Hospital Stay: Payer: Self-pay

## 2019-11-28 VITALS — BP 132/75 | HR 62 | Temp 97.2°F | Resp 17 | Ht 61.0 in | Wt 102.8 lb

## 2019-11-28 DIAGNOSIS — D509 Iron deficiency anemia, unspecified: Secondary | ICD-10-CM | POA: Insufficient documentation

## 2019-11-28 DIAGNOSIS — Z800 Family history of malignant neoplasm of digestive organs: Secondary | ICD-10-CM | POA: Insufficient documentation

## 2019-11-28 DIAGNOSIS — C189 Malignant neoplasm of colon, unspecified: Secondary | ICD-10-CM

## 2019-11-28 DIAGNOSIS — D500 Iron deficiency anemia secondary to blood loss (chronic): Secondary | ICD-10-CM

## 2019-11-28 DIAGNOSIS — K219 Gastro-esophageal reflux disease without esophagitis: Secondary | ICD-10-CM | POA: Insufficient documentation

## 2019-11-28 DIAGNOSIS — Z21 Asymptomatic human immunodeficiency virus [HIV] infection status: Secondary | ICD-10-CM | POA: Insufficient documentation

## 2019-11-28 DIAGNOSIS — Z8042 Family history of malignant neoplasm of prostate: Secondary | ICD-10-CM | POA: Insufficient documentation

## 2019-11-28 DIAGNOSIS — I10 Essential (primary) hypertension: Secondary | ICD-10-CM | POA: Insufficient documentation

## 2019-11-28 DIAGNOSIS — C181 Malignant neoplasm of appendix: Secondary | ICD-10-CM | POA: Insufficient documentation

## 2019-11-28 DIAGNOSIS — R200 Anesthesia of skin: Secondary | ICD-10-CM | POA: Insufficient documentation

## 2019-11-28 LAB — CBC WITH DIFFERENTIAL (CANCER CENTER ONLY)
Abs Immature Granulocytes: 0.01 10*3/uL (ref 0.00–0.07)
Basophils Absolute: 0.1 10*3/uL (ref 0.0–0.1)
Basophils Relative: 1 %
Eosinophils Absolute: 0.1 10*3/uL (ref 0.0–0.5)
Eosinophils Relative: 2 %
HCT: 37 % (ref 36.0–46.0)
Hemoglobin: 12.1 g/dL (ref 12.0–15.0)
Immature Granulocytes: 0 %
Lymphocytes Relative: 31 %
Lymphs Abs: 2 10*3/uL (ref 0.7–4.0)
MCH: 26.4 pg (ref 26.0–34.0)
MCHC: 32.7 g/dL (ref 30.0–36.0)
MCV: 80.8 fL (ref 80.0–100.0)
Monocytes Absolute: 0.7 10*3/uL (ref 0.1–1.0)
Monocytes Relative: 11 %
Neutro Abs: 3.5 10*3/uL (ref 1.7–7.7)
Neutrophils Relative %: 55 %
Platelet Count: 288 10*3/uL (ref 150–400)
RBC: 4.58 MIL/uL (ref 3.87–5.11)
RDW: 18.9 % — ABNORMAL HIGH (ref 11.5–15.5)
WBC Count: 6.4 10*3/uL (ref 4.0–10.5)
nRBC: 0 % (ref 0.0–0.2)

## 2019-11-28 LAB — CMP (CANCER CENTER ONLY)
ALT: 10 U/L (ref 0–44)
AST: 13 U/L — ABNORMAL LOW (ref 15–41)
Albumin: 3.7 g/dL (ref 3.5–5.0)
Alkaline Phosphatase: 59 U/L (ref 38–126)
Anion gap: 7 (ref 5–15)
BUN: 11 mg/dL (ref 6–20)
CO2: 22 mmol/L (ref 22–32)
Calcium: 9.4 mg/dL (ref 8.9–10.3)
Chloride: 107 mmol/L (ref 98–111)
Creatinine: 0.68 mg/dL (ref 0.44–1.00)
GFR, Est AFR Am: >60 mL/min (ref >60)
GFR, Estimated: >60 mL/min (ref >60)
Glucose, Bld: 83 mg/dL (ref 70–99)
Potassium: 3.9 mmol/L (ref 3.5–5.1)
Sodium: 136 mmol/L (ref 135–145)
Total Bilirubin: 1 mg/dL (ref 0.3–1.2)
Total Protein: 8 g/dL (ref 6.5–8.1)

## 2019-11-28 NOTE — Progress Notes (Signed)
  [place] OFFICE PROGRESS NOTE   Diagnosis: Appendiceal carcinoma  INTERVAL HISTORY:   Shirley White returns after canceling a follow-up visit last month.  She reports the Xeloda shipment for cycle 4 did not arrive on time.  She is unsure what day she started taking it or how many pills are remaining to complete the cycle.  She denies nausea/vomiting.  No mouth sores.  No diarrhea.  No hand or foot pain or redness.  Objective:  Vital signs in last 24 hours:  Blood pressure 132/75, pulse 62, temperature (!) 97.2 F (36.2 C), temperature source Axillary, resp. rate 17, height _0  (1.549 m), weight 102 lb 12.8 oz (46.6 kg), SpO2 100 %.    HEENT: No thrush or ulcers. GI: Abdomen soft and nontender.  No hepatomegaly.  No mass. Vascular: No leg edema. Neuro: Alert and oriented. Skin: Palms without erythema.   Lab Results:  Lab Results  Component Value Date   WBC 6.4 11/28/2019   HGB 12.1 11/28/2019   HCT 37.0 11/28/2019   MCV 80.8 11/28/2019   PLT 288 11/28/2019   NEUTROABS 3.5 11/28/2019    Imaging:  No results found.  Medications: I have reviewed the patient's current medications.  Assessment/Plan: 1. 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 Right colectomy 07/14/2019, T3N0 tumor, 0/35 lymph nodes, no loss of mismatch repair protein expression, no macroscopic tumor perforation, focal lymphovascular invasion ? CT abdomen/pelvis 07/13/2019-masslike thickening of the appendix with dilatation of the tip with mucosal enhancement, uterine fibroids, subcentimeter right lower quadrant mesenteric nodes ? Cycle 1 Xeloda 08/15/2019 ? Cycle 2 Xeloda 09/05/2019 ? Cycle 3 Xeloda 09/26/2019 ? Cycle 4 Xeloda ? 2. HIV positive 3. Hypertension 4. Family history of colon and prostate cancer 5. Gastroesophageal reflux disease 6. Bilateral foot numbness/pain-gabapentin prescribed 7. Iron deficiency anemia-on oral iron  Disposition: Shirley White  appears stable.  She is currently completing cycle 4 Xeloda.  She will call the office back later today to let us know how many pills are remaining in the cycle.  Overall she seems to be tolerating  Xeloda well with no significant acute toxicities.  We reviewed the CBC and chemistry panel from today.  Labs adequate to continue with Xeloda.  We will schedule lab and follow-up pending a determination on the start date of cycle 4.   Ned Card ANP/GNP-BC   11/28/2019  2:57 PM

## 2019-11-29 ENCOUNTER — Other Ambulatory Visit: Payer: Self-pay | Admitting: Infectious Diseases

## 2019-11-29 ENCOUNTER — Telehealth: Payer: Self-pay | Admitting: Nurse Practitioner

## 2019-11-29 NOTE — Telephone Encounter (Signed)
No appointments scheduled. No check out notes provided.  

## 2019-12-21 ENCOUNTER — Telehealth: Payer: Self-pay

## 2019-12-21 NOTE — Telephone Encounter (Signed)
Nutrition Follow-up:  Patient with appendical carcinoma and iron deficiency anemia.  S/p right colectomy (4/7).  Patient taking xeloda  Spoke with patient via phone for nutrition follow-up.  Patient reports appetite is good.  Eats about 2 meals per day if she is working, otherwise 3 meals per day.  Breakfast is usually boiled eggs or toast.  Dinner is full course meal with meat and couple of sides.  Drinks a shake BID.    Denies any nutrition impact symptoms at this time.   Medications: reviewed  Labs: reviewed  Anthropometrics:   Weight increased to 102# 12.8 oz on 8/16 increased from 99 lb 6.4 oz in May 2021.    NUTRITION DIAGNOSIS: Inadequate oral intake improved   INTERVENTION:  Encouraged patient to continue eating good sources of protein, well balanced diet.   Encouraged patient to continue oral nutrition supplements for added calories and protein Patient knows to contact RD if changes occur in weight and/or appetite   NEXT VISIT: no follow-up  Shirley White, [place], [place] Registered Dietitian [phone] (mobile)

## 2019-12-22 ENCOUNTER — Other Ambulatory Visit: Payer: Self-pay | Admitting: Infectious Diseases

## 2019-12-27 ENCOUNTER — Ambulatory Visit: Payer: Self-pay | Attending: Internal Medicine | Admitting: Internal Medicine

## 2019-12-27 ENCOUNTER — Encounter: Payer: Self-pay | Admitting: Internal Medicine

## 2019-12-27 ENCOUNTER — Other Ambulatory Visit: Payer: Self-pay

## 2019-12-27 VITALS — BP 122/60 | HR 63 | Temp 99.1°F | Resp 16 | Ht <= 58 in | Wt 105.6 lb

## 2019-12-27 DIAGNOSIS — Z7689 Persons encountering health services in other specified circumstances: Secondary | ICD-10-CM

## 2019-12-27 DIAGNOSIS — Z21 Asymptomatic human immunodeficiency virus [HIV] infection status: Secondary | ICD-10-CM

## 2019-12-27 DIAGNOSIS — R011 Cardiac murmur, unspecified: Secondary | ICD-10-CM | POA: Insufficient documentation

## 2019-12-27 DIAGNOSIS — I10 Essential (primary) hypertension: Secondary | ICD-10-CM

## 2019-12-27 DIAGNOSIS — C181 Malignant neoplasm of appendix: Secondary | ICD-10-CM

## 2019-12-27 DIAGNOSIS — Z1231 Encounter for screening mammogram for malignant neoplasm of breast: Secondary | ICD-10-CM

## 2019-12-27 DIAGNOSIS — D500 Iron deficiency anemia secondary to blood loss (chronic): Secondary | ICD-10-CM

## 2019-12-27 DIAGNOSIS — Z8742 Personal history of other diseases of the female genital tract: Secondary | ICD-10-CM

## 2019-12-27 MED ORDER — AMLODIPINE BESYLATE 10 MG PO TABS: 10.0000 mg | ORAL_TABLET | Freq: Every day | ORAL | 5 refills | Status: DC

## 2019-12-27 MED ORDER — LISINOPRIL 5 MG PO TABS: ORAL_TABLET | ORAL | 5 refills | Status: DC

## 2019-12-27 NOTE — Patient Instructions (Signed)
Ask your oncologist about whether you should receive the flu shot while receiving chemotherapy.

## 2019-12-27 NOTE — Progress Notes (Signed)
Patient ID: Shirley White, female    DOB: February 01, 1971  MRN: [id number]  CC: New Patient (Initial Visit) and Hypertension   Subjective: Shirley White is a 49 y.o. female who presents for new pt visit Her concerns today include:  Patient with history of HTN, IDA, colon/appendix cancer s/p RT colectomy 07/2019, HIV, sickle cell trait, right breast biopsy 04/2018 with pathology showing fibrocystic changes and intraductal papilloma.  Right breast lumpectomy done 06/23/2018 with pathology showing intraductal papilloma, radial scar, adenosis, usual to moderate duct epithelial hyperplasia.  Fibrocystic changes  Colon CA:  Dx in 07/2019.  Receiving chemo and currently completed 5/8 cycles.  Tolerating chemo okay. Appetite goes and comes.  She drinks shakes to supplement meals. -loss 10 lbs around time of dx but has gained about 5 pounds back so far.   HYPERTENSION Currently taking: see medication list.  Norvasc and lisinopril are on hold this. Med Adherence: [x]  Yes patient confirms that she is taking Norvasc 10 mg and Lisinopril 5 mg daily Medication side effects: []  Yes    [x]  No Adherence with salt restriction: likes potato chips Home Monitoring?: []  Yes    [x]  No Monitoring Frequency: []  Yes    [x]  No Home BP results range: []  Yes    []  No SOB? [x]  Yes when doing house work and when she walks for more than 20-30 mins.  No PND, LE edema Chest Pain?: []  Yes    [x]  No Leg swelling?: []  Yes    [x]  No Headaches?: []  Yes    [x]  No Dizziness? []  Yes    [x]  No Comments:   IDA:  Takes iron supplement BID.  Had IDA even before colon CA.  She attributes the iron deficiency to menorrhagia from fibroids.  Menses last 5-6 days.  Heavy bleeding first 4 days.  She tells me that she never saw GYN, but looks like she may have seen them in 2000 for abnormal Pap smear.    HIV: Followed by ID.  She is on Biktarvy and takes consistently.  Viral load undetectable. 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 Patient Active Problem List   Diagnosis Date Noted  . Neuropathy 08/19/2019  . Hypertension 08/19/2019  . 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 Adenocarcinoma, colon ([place]) 07/20/2019  . HIV (human immunodeficiency virus infection) ([place]) 07/14/2018  . 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 Thrombocytopenia ([place]) 12/12/2017  . Routine health maintenance 08/28/2016  . H/O menorrhagia 09/05/2014  . Iron deficiency anemia 09/05/2014  . Sickle-cell trait ([place]) 06/04/2006     Current Outpatient Medications on File Prior to Visit  Medication Sig Dispense Refill  . acetaminophen (TYLENOL) 325 MG tablet Take 2 tablets (650 mg total) by mouth every 6 (six) hours as needed.    Marland Kitchen BIKTARVY 50-200-25 MG TABS tablet TAKE 1 TABLET BY MOUTH DAILY, TO TAKE AT THE SAME TIME EACH DAY WITH OR WITHOUT FOOD 30 tablet 5  . capecitabine (XELODA) 500 MG tablet Take 3 tablets by mouth in AM and 2 tablets in PM. Take with food. Take for 14 days, then hold for 7 days. Repeat every 21 days. Start on 11/28/19 70 tablet 0  . ferrous sulfate 325 (65 FE) MG EC tablet Take 1 tablet (325 mg total) by mouth 2 (two) times daily with a meal.  3  . gabapentin (NEURONTIN) 300 MG capsule TAKE 1 CAPSULE(300 MG) BY MOUTH  THREE TIMES DAILY 90 capsule 3  . oxyCODONE (OXY IR/ROXICODONE) 5 MG immediate release tablet Take 1 tablet (5 mg total) by mouth every 8 (eight) hours as needed for moderate pain. 10 tablet 0  . prochlorperazine (COMPAZINE) 10 MG tablet Take 1 tablet (10 mg total) by mouth every 6 (six) hours as needed for nausea. 30 tablet 1   No current facility-administered medications on file prior to visit.    No Known Allergies  Social History   Socioeconomic History  . Marital status: Single    Spouse name: Not on file  . Number of children: 4  . Years of education: Not on file  . Highest education level: 12th grade   Occupational History  . Occupation: Scientist, water quality at [employer]  Tobacco Use  . Smoking status: Never Smoker  . Smokeless tobacco: Never Used  Vaping Use  . Vaping Use: Never used  Substance and Sexual Activity  . Alcohol use: Yes    Alcohol/week: 1.0 standard drink    Types: 1 Cans of beer per week    Comment: occa  . Drug use: Never  . Sexual activity: Not Currently    Partners: Male    Birth control/protection: Condom    Comment: given condoms  Other Topics Concern  . Not on file  Social History Narrative   Patient lives alone   Patient has to take public transportation   Social Determinants of Health   Financial Resource Strain:   . Difficulty of Paying Living Expenses: Not on file  Food Insecurity:   . Worried About Charity fundraiser in the Last Year: Not on file  . Ran Out of Food in the Last Year: Not on file  Transportation Needs:   . Lack of Transportation (Medical): Not on file  . Lack of Transportation (Non-Medical): Not on file  Physical Activity:   . Days of Exercise per Week: Not on file  . Minutes of Exercise per Session: Not on file  Stress:   . Feeling of Stress : Not on file  Social Connections:   . Frequency of Communication with Friends and Family: Not on file  . Frequency of Social Gatherings with Friends and Family: Not on file  . Attends Religious Services: Not on file  . Active Member of Clubs or Organizations: Not on file  . Attends Archivist Meetings: Not on file  . Marital Status: Not on file  Intimate Partner Violence:   . Fear of Current or Ex-Partner: Not on file  . Emotionally Abused: Not on file  . Physically Abused: Not on file  . Sexually Abused: Not on file    Family History  Problem Relation Age of Onset  . Hyperlipidemia Mother   . Hypertension Mother   . Throat cancer Father   . Colon cancer Maternal Aunt   . Leukemia Neg Hx   . Lymphoma Neg Hx   . Esophageal cancer Neg Hx   . Stomach cancer Neg Hx   . Rectal  cancer Neg Hx     Past Surgical History:  Procedure Laterality Date  . CESAREAN SECTION    . ESOPHAGOGASTRODUODENOSCOPY N/A 02/03/2016   Procedure: ESOPHAGOGASTRODUODENOSCOPY (EGD);  Surgeon: Manus Gunning, MD;  Location: [place];  Service: Gastroenterology;  Laterality: N/A;  . LAPAROSCOPIC APPENDECTOMY N/A 07/14/2019   Procedure: LAPAROSCOPIC APPENDECTOMY CONVERTED TO OPEN RIGHT HEMICOLECTOMY;  Surgeon: Coralie Keens, MD;  Location: [facility];  Service: General;  Laterality: N/A;  . RADIOACTIVE SEED GUIDED  EXCISIONAL BREAST BIOPSY Right 06/23/2018   Procedure: RADIOACTIVE SEED GUIDED EXCISIONAL RIGHT BREAST BIOPSY;  Surgeon: Stark Klein, MD;  Location: [facility];  Service: General;  Laterality: Right;    ROS: Review of Systems Negative except as stated above  PHYSICAL EXAM: BP 122/60   Pulse 63   Temp 99.1 F (37.3 C)   Resp 16   Ht 4\' 9"  (1.448 m)   Wt 105 lb 9.6 oz (47.9 kg)   SpO2 100%   BMI 22.85 kg/m   Wt Readings from Last 3 Encounters:  12/27/19 105 lb 9.6 oz (47.9 kg)  11/28/19 102 lb 12.8 oz (46.6 kg)  10/12/19 102 lb 3.2 oz (46.4 kg)    Physical Exam  General appearance - alert, well appearing, and in no distress Mental status - normal mood, behavior, speech, dress, motor activity, and thought processes Eyes - pupils equal and reactive, extraocular eye movements intact Neck - supple, no significant adenopathy Chest - clear to auscultation, no wheezes, rales or rhonchi, symmetric air entry Heart - normal rate, regular rhythm, normal S1, S2.  Grade 2/6 systolic ejection murmur left upper sternal border Extremities - peripheral pulses normal, no pedal edema, no clubbing or cyanosis   CMP Latest Ref Rng & Units 11/28/2019 10/11/2019 09/20/2019  Glucose 70 - 99 mg/dL 83 130(H) 92  BUN 6 - 20 mg/dL 11 8 8   Creatinine 0.44 - 1.00 mg/dL 0.68 0.68 0.69  Sodium 135 - 145 mmol/L 136 140 138  Potassium 3.5 - 5.1 mmol/L 3.9 3.4(L) 4.2   Chloride 98 - 111 mmol/L 107 106 105  CO2 22 - 32 mmol/L 22 25 25   Calcium 8.9 - 10.3 mg/dL 9.4 8.8(L) 9.2  Total Protein 6.5 - 8.1 g/dL 8.0 7.4 7.6  Total Bilirubin 0.3 - 1.2 mg/dL 1.0 0.9 1.2  Alkaline Phos 38 - 126 U/L 59 66 63  AST 15 - 41 U/L 13(L) 12(L) 12(L)  ALT 0 - 44 U/L 10 9 10    Lipid Panel     Component Value Date/Time   CHOL 143 08/21/2015 1132   TRIG 62 08/21/2015 1132   HDL 61 08/21/2015 1132   CHOLHDL 2.3 08/21/2015 1132   VLDL 12 08/21/2015 1132   LDLCALC 70 08/21/2015 1132    CBC    Component Value Date/Time   WBC 6.4 11/28/2019 1423   WBC 8.4 07/19/2019 0447   RBC 4.58 11/28/2019 1423   HGB 12.1 11/28/2019 1423   HGB 10.7 (L) 08/28/2016 1423   HCT 37.0 11/28/2019 1423   HCT 33.2 (L) 08/28/2016 1423   PLT 288 11/28/2019 1423   PLT 294 08/28/2016 1423   MCV 80.8 11/28/2019 1423   MCV 76 (L) 08/28/2016 1423   MCH 26.4 11/28/2019 1423   MCHC 32.7 11/28/2019 1423   RDW 18.9 (H) 11/28/2019 1423   RDW 16.4 (H) 08/28/2016 1423   LYMPHSABS 2.0 11/28/2019 1423   MONOABS 0.7 11/28/2019 1423   EOSABS 0.1 11/28/2019 1423   BASOSABS 0.1 11/28/2019 1423    ASSESSMENT AND PLAN: 1. Establishing care with new doctor, encounter for  2. Essential hypertension At goal.  Continue current medications and low-salt diet. - lisinopril (ZESTRIL) 5 MG tablet; TAKE 1 TABLET(5 MG) BY MOUTH DAILY.  Dispense: 30 tablet; Refill: 5 - amLODipine (NORVASC) 10 MG tablet; Take 1 tablet (10 mg total) by mouth daily.  Dispense: 30 tablet; Refill: 5  3. Iron deficiency anemia due to chronic blood loss 4. H/O menorrhagia Continue iron  supplement.  Last hemoglobin was 12.1 which was increased from 9.62 months ago.  5. Asymptomatic HIV infection ([place]) Followed by ID.  Compliant with medication.  6. Heart murmur Patient tells me that she is aware of the murmur and has been told in the past that she has 1.  She is currently asymptomatic.  7. Encounter for screening mammogram for  malignant neoplasm of breast - MM Digital Screening; Future  8. Cancer of appendix [facility]) Actively receiving treatment and status post right colectomy   Patient was given the opportunity to ask questions.  Patient verbalized understanding of the plan and was able to repeat key elements of the plan.   Orders Placed This Encounter  Procedures  . MM Digital Screening     Requested Prescriptions   Signed Prescriptions Disp Refills  . lisinopril (ZESTRIL) 5 MG tablet 30 tablet 5    Sig: TAKE 1 TABLET(5 MG) BY MOUTH DAILY.  Marland Kitchen amLODipine (NORVASC) 10 MG tablet 30 tablet 5    Sig: Take 1 tablet (10 mg total) by mouth daily.    Return in about 3 months (around 03/27/2020) for PAP and f/u BP.  Karle Plumber, MD, FACP

## 2020-01-06 ENCOUNTER — Encounter: Payer: Self-pay | Admitting: Infectious Diseases

## 2020-01-16 ENCOUNTER — Telehealth: Payer: Self-pay

## 2020-01-16 NOTE — Telephone Encounter (Signed)
Patient left voicemail requesting call back in regards to medication questions. Returned call but had to leave VM. Awaiting call back.  Jeraline Marcinek Lorita Officer, RN

## 2020-01-19 ENCOUNTER — Encounter: Payer: Self-pay | Admitting: Oncology

## 2020-01-20 ENCOUNTER — Telehealth: Payer: Self-pay

## 2020-01-20 NOTE — Telephone Encounter (Signed)
Pt called left a message stating she was not feeling well and wanted to come into see provider. When called pt states incisional pain comes and goes and she works she can only come after 3p asked if she needed to go to urgent care or ER pt states its ok but she understands

## 2020-01-23 ENCOUNTER — Ambulatory Visit: Payer: Self-pay | Admitting: Nurse Practitioner

## 2020-01-23 ENCOUNTER — Ambulatory Visit: Payer: Self-pay

## 2020-01-23 ENCOUNTER — Telehealth: Payer: Self-pay | Admitting: Nurse Practitioner

## 2020-01-23 ENCOUNTER — Other Ambulatory Visit: Payer: Self-pay | Admitting: Nurse Practitioner

## 2020-01-23 DIAGNOSIS — C181 Malignant neoplasm of appendix: Secondary | ICD-10-CM

## 2020-01-23 NOTE — Progress Notes (Deleted)
[place]   [phone] [phone]   Clinic Follow up Note   Patient Care Team: Ladell Pier, MD as PCP - General (Internal Medicine) Trinity Callas, NP as Nurse Practitioner (Infectious Diseases) Jonnie Finner, RN as Oncology Nurse Navigator Ladell Pier, MD as Consulting Physician (Oncology) 01/23/2020  CHIEF COMPLAINT: Follow-up appendiceal carcinoma  CURRENT THERAPY: Adjuvant Xeloda  INTERVAL HISTORY: Ms. Handyside returns for work-in visit.  She was last seen on 11/28/2019 at the completion of cycle 4 Xeloda.  She sent a MyChart message on 10/7 reporting "feeling sick," having pain in the area of her previous cancer and requesting an office visit.   REVIEW OF SYSTEMS:   Constitutional: Denies fevers, chills or abnormal weight loss Eyes: Denies blurriness of vision Ears, nose, mouth, throat, and face: Denies mucositis or sore throat Respiratory: Denies cough, dyspnea or wheezes Cardiovascular: Denies palpitation, chest discomfort or lower extremity swelling Gastrointestinal:  Denies nausea, heartburn or change in bowel habits Skin: Denies abnormal skin rashes Lymphatics: Denies new lymphadenopathy or easy bruising Neurological:Denies numbness, tingling or new weaknesses Behavioral/Psych: Mood is stable, no new changes  All other systems were reviewed with the patient and are negative.  MEDICAL HISTORY:  Past Medical History:  Diagnosis Date  . Anemia   . GERD (gastroesophageal reflux disease)    no meds  . Heart murmur   . HIV disease ([place])     SURGICAL HISTORY: Past Surgical History:  Procedure Laterality Date  . CESAREAN SECTION    . ESOPHAGOGASTRODUODENOSCOPY N/A 02/03/2016   Procedure: ESOPHAGOGASTRODUODENOSCOPY (EGD);  Surgeon: Manus Gunning, MD;  Location: [place];  Service: Gastroenterology;  Laterality: N/A;  . LAPAROSCOPIC APPENDECTOMY N/A 07/14/2019   Procedure: LAPAROSCOPIC APPENDECTOMY  CONVERTED TO OPEN RIGHT HEMICOLECTOMY;  Surgeon: Coralie Keens, MD;  Location: [facility];  Service: General;  Laterality: N/A;  . RADIOACTIVE SEED GUIDED EXCISIONAL BREAST BIOPSY Right 06/23/2018   Procedure: RADIOACTIVE SEED GUIDED EXCISIONAL RIGHT BREAST BIOPSY;  Surgeon: Stark Klein, MD;  Location: [facility];  Service: General;  Laterality: Right;    I have reviewed the social history and family history with the patient and they are unchanged from previous note.  ALLERGIES:  has No Known Allergies.  MEDICATIONS:  Current Outpatient Medications  Medication Sig Dispense Refill  . acetaminophen (TYLENOL) 325 MG tablet Take 2 tablets (650 mg total) by mouth every 6 (six) hours as needed.    Marland Kitchen amLODipine (NORVASC) 10 MG tablet Take 1 tablet (10 mg total) by mouth daily. 30 tablet 5  . BIKTARVY 50-200-25 MG TABS tablet TAKE 1 TABLET BY MOUTH DAILY, TO TAKE AT THE SAME TIME EACH DAY WITH OR WITHOUT FOOD 30 tablet 5  . capecitabine (XELODA) 500 MG tablet Take 3 tablets by mouth in AM and 2 tablets in PM. Take with food. Take for 14 days, then hold for 7 days. Repeat every 21 days. Start on 11/28/19 70 tablet 0  . ferrous sulfate 325 (65 FE) MG EC tablet Take 1 tablet (325 mg total) by mouth 2 (two) times daily with a meal.  3  . gabapentin (NEURONTIN) 300 MG capsule TAKE 1 CAPSULE(300 MG) BY MOUTH THREE TIMES DAILY 90 capsule 3  . lisinopril (ZESTRIL) 5 MG tablet TAKE 1 TABLET(5 MG) BY MOUTH DAILY. 30 tablet 5  . oxyCODONE (OXY IR/ROXICODONE) 5 MG immediate release tablet Take 1 tablet (5 mg total) by mouth every 8 (eight) hours as needed for moderate pain. 10  tablet 0  . prochlorperazine (COMPAZINE) 10 MG tablet Take 1 tablet (10 mg total) by mouth every 6 (six) hours as needed for nausea. 30 tablet 1   No current facility-administered medications for this visit.    PHYSICAL EXAMINATION: ECOG PERFORMANCE STATUS: {CHL ONC ECOG PS:[id number]}  There were no vitals filed for this  visit. There were no vitals filed for this visit.  GENERAL:alert, no distress and comfortable SKIN: skin color, texture, turgor are normal, no rashes or significant lesions EYES: normal, Conjunctiva are pink and non-injected, sclera clear OROPHARYNX:no exudate, no erythema and lips, buccal mucosa, and tongue normal  NECK: supple, thyroid normal size, non-tender, without nodularity LYMPH:  no palpable lymphadenopathy in the cervical, axillary or inguinal LUNGS: clear to auscultation and percussion with normal breathing effort HEART: regular rate & rhythm and no murmurs and no lower extremity edema ABDOMEN:abdomen soft, non-tender and normal bowel sounds Musculoskeletal:no cyanosis of digits and no clubbing  NEURO: alert & oriented x 3 with fluent speech, no focal motor/sensory deficits  LABORATORY DATA:  I have reviewed the data as listed CBC Latest Ref Rng & Units 11/28/2019 10/11/2019 09/20/2019  WBC 4.0 - 10.5 K/uL 6.4 5.4 5.8  Hemoglobin 12.0 - 15.0 g/dL 12.1 9.6(L) 9.3(L)  Hematocrit 36 - 46 % 37.0 30.7(L) 29.1(L)  Platelets 150 - 400 K/uL 288 402(H) 380     CMP Latest Ref Rng & Units 11/28/2019 10/11/2019 09/20/2019  Glucose 70 - 99 mg/dL 83 130(H) 92  BUN 6 - 20 mg/dL 11 8 8   Creatinine 0.44 - 1.00 mg/dL 0.68 0.68 0.69  Sodium 135 - 145 mmol/L 136 140 138  Potassium 3.5 - 5.1 mmol/L 3.9 3.4(L) 4.2  Chloride 98 - 111 mmol/L 107 106 105  CO2 22 - 32 mmol/L 22 25 25   Calcium 8.9 - 10.3 mg/dL 9.4 8.8(L) 9.2  Total Protein 6.5 - 8.1 g/dL 8.0 7.4 7.6  Total Bilirubin 0.3 - 1.2 mg/dL 1.0 0.9 1.2  Alkaline Phos 38 - 126 U/L 59 66 63  AST 15 - 41 U/L 13(L) 12(L) 12(L)  ALT 0 - 44 U/L 10 9 10       RADIOGRAPHIC STUDIES: I have personally reviewed the radiological images as listed and agreed with the findings in the report. No results found.   ASSESSMENT & PLAN:  No problem-specific Assessment & Plan notes found for this encounter.   No orders of the defined types were placed in  this encounter.  All questions were answered. The patient knows to call the clinic with any problems, questions or concerns. No barriers to learning was detected. I spent {CHL ONC TIME VISIT - VFMBB:[id number]} counseling the patient face to face. The total time spent in the appointment was {CHL ONC TIME VISIT - VKFMM:[id number]} and more than 50% was on counseling and review of test results     Alla Feeling, NP 01/23/20

## 2020-01-23 NOTE — Telephone Encounter (Signed)
Called pt per 10/11 sch msg - no answer. Left message with appt date and time

## 2020-01-25 ENCOUNTER — Telehealth: Payer: Self-pay

## 2020-01-25 ENCOUNTER — Telehealth: Payer: Self-pay | Admitting: Oncology

## 2020-01-25 NOTE — Telephone Encounter (Signed)
Called patient about coming in to be seen tomorrow. Patient states she can not come in until after 3pm when she gets off work. Working on getting patient scheduled at a later time.

## 2020-01-25 NOTE — Telephone Encounter (Signed)
-----   Message from Owens Shark, NP sent at 01/25/2020  2:00 PM EDT ----- She had called for an appointment earlier this week.  She did not show for the appointment.  Please call her and offer tomorrow with me at [phone].  Thanks

## 2020-01-25 NOTE — Telephone Encounter (Signed)
Scheduled appt per 10/13 sch msg - spoke with patient and she is aware

## 2020-02-06 ENCOUNTER — Inpatient Hospital Stay: Payer: Self-pay | Attending: Oncology | Admitting: Oncology

## 2020-02-06 ENCOUNTER — Other Ambulatory Visit: Payer: Self-pay

## 2020-02-06 ENCOUNTER — Inpatient Hospital Stay: Payer: Self-pay

## 2020-02-06 VITALS — BP 142/74 | HR 78 | Temp 98.4°F | Resp 16 | Ht <= 58 in | Wt 101.4 lb

## 2020-02-06 DIAGNOSIS — D509 Iron deficiency anemia, unspecified: Secondary | ICD-10-CM | POA: Insufficient documentation

## 2020-02-06 DIAGNOSIS — D500 Iron deficiency anemia secondary to blood loss (chronic): Secondary | ICD-10-CM

## 2020-02-06 DIAGNOSIS — K219 Gastro-esophageal reflux disease without esophagitis: Secondary | ICD-10-CM | POA: Insufficient documentation

## 2020-02-06 DIAGNOSIS — C181 Malignant neoplasm of appendix: Secondary | ICD-10-CM | POA: Insufficient documentation

## 2020-02-06 DIAGNOSIS — I10 Essential (primary) hypertension: Secondary | ICD-10-CM | POA: Insufficient documentation

## 2020-02-06 DIAGNOSIS — R200 Anesthesia of skin: Secondary | ICD-10-CM | POA: Insufficient documentation

## 2020-02-06 DIAGNOSIS — Z8042 Family history of malignant neoplasm of prostate: Secondary | ICD-10-CM | POA: Insufficient documentation

## 2020-02-06 DIAGNOSIS — Z21 Asymptomatic human immunodeficiency virus [HIV] infection status: Secondary | ICD-10-CM | POA: Insufficient documentation

## 2020-02-06 DIAGNOSIS — Z800 Family history of malignant neoplasm of digestive organs: Secondary | ICD-10-CM | POA: Insufficient documentation

## 2020-02-06 LAB — CBC WITH DIFFERENTIAL (CANCER CENTER ONLY)
Abs Immature Granulocytes: 0.02 10*3/uL (ref 0.00–0.07)
Basophils Absolute: 0.1 10*3/uL (ref 0.0–0.1)
Basophils Relative: 2 %
Eosinophils Absolute: 0.1 10*3/uL (ref 0.0–0.5)
Eosinophils Relative: 2 %
HCT: 26.8 % — ABNORMAL LOW (ref 36.0–46.0)
Hemoglobin: 8.5 g/dL — ABNORMAL LOW (ref 12.0–15.0)
Immature Granulocytes: 0 %
Lymphocytes Relative: 32 %
Lymphs Abs: 2.1 10*3/uL (ref 0.7–4.0)
MCH: 22.2 pg — ABNORMAL LOW (ref 26.0–34.0)
MCHC: 31.7 g/dL (ref 30.0–36.0)
MCV: 70 fL — ABNORMAL LOW (ref 80.0–100.0)
Monocytes Absolute: 0.8 10*3/uL (ref 0.1–1.0)
Monocytes Relative: 12 %
Neutro Abs: 3.5 10*3/uL (ref 1.7–7.7)
Neutrophils Relative %: 52 %
Platelet Count: 472 10*3/uL — ABNORMAL HIGH (ref 150–400)
RBC: 3.83 MIL/uL — ABNORMAL LOW (ref 3.87–5.11)
RDW: 18.2 % — ABNORMAL HIGH (ref 11.5–15.5)
WBC Count: 6.6 10*3/uL (ref 4.0–10.5)
nRBC: 0 % (ref 0.0–0.2)

## 2020-02-06 LAB — CMP (CANCER CENTER ONLY)
ALT: 9 U/L (ref 0–44)
AST: 9 U/L — ABNORMAL LOW (ref 15–41)
Albumin: 3.7 g/dL (ref 3.5–5.0)
Alkaline Phosphatase: 52 U/L (ref 38–126)
Anion gap: 5 (ref 5–15)
BUN: 10 mg/dL (ref 6–20)
CO2: 25 mmol/L (ref 22–32)
Calcium: 8.9 mg/dL (ref 8.9–10.3)
Chloride: 108 mmol/L (ref 98–111)
Creatinine: 0.7 mg/dL (ref 0.44–1.00)
GFR, Estimated: >60 mL/min (ref >60)
Glucose, Bld: 90 mg/dL (ref 70–99)
Potassium: 3.7 mmol/L (ref 3.5–5.1)
Sodium: 138 mmol/L (ref 135–145)
Total Bilirubin: 1.1 mg/dL (ref 0.3–1.2)
Total Protein: 7.6 g/dL (ref 6.5–8.1)

## 2020-02-06 NOTE — Progress Notes (Signed)
Shirley White   Diagnosis: Colon cancer   INTERVAL HISTORY:   Shirley White was last seen at the Cancer center on 11/28/2019.  She was completing cycle 4 Xeloda at the time, but was unclear on the start date of cycle 4.  She reports that she has continued to take Xeloda for the past 2 months, though she is not sure on start/end date.  She says she has received refills from the mail order pharmacy. Shirley White has mild discomfort at the abdominal surgical site.  No mouth sores or diarrhea.  No bleeding other than her menstrual cycle.  She is not taking iron.  She has dryness at the feet.  She has difficulty taking iron pills.  Objective:  Vital signs in last 24 hours:  Blood pressure (!) 142/74, pulse 78, temperature 98.4 F (36.9 C), temperature source Tympanic, resp. rate 16, height '4\' 9"'  (1.448 m), weight 101 lb 6.4 oz (46 kg), SpO2 100 %.    HEENT: No thrush or ulcers Resp: Lungs clear bilaterally Cardio: Regular rate and rhythm, 2/6 systolic murmur GI: No hepatosplenomegaly, small reducible ventral hernia Vascular: No leg edema  Skin: Dryness of the hands, dryness and callus formation at the soles, no erythema or skin breakdown   Lab Results:  Lab Results  Component Value Date   WBC 6.6 02/06/2020   HGB 8.5 (L) 02/06/2020   HCT 26.8 (L) 02/06/2020   MCV 70.0 (L) 02/06/2020   PLT 472 (H) 02/06/2020   NEUTROABS 3.5 02/06/2020    CMP  Lab Results  Component Value Date   NA 138 02/06/2020   K 3.7 02/06/2020   CL 108 02/06/2020   CO2 25 02/06/2020   GLUCOSE 90 02/06/2020   BUN 10 02/06/2020   CREATININE 0.70 02/06/2020   CALCIUM 8.9 02/06/2020   PROT 7.6 02/06/2020   ALBUMIN 3.7 02/06/2020   AST 9 (L) 02/06/2020   ALT 9 02/06/2020   ALKPHOS 52 02/06/2020   BILITOT 1.1 02/06/2020   GFRNONAA >60 02/06/2020   GFRAA >60 11/28/2019    Lab Results  Component Value Date   CEA1 1.5 07/19/2019    Medications: I have reviewed the  patient's current medications.   Assessment/Plan: 1. 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 Right colectomy 07/14/2019, T3N0 tumor, 0/35 lymph nodes, no loss of mismatch repair protein expression, no macroscopic tumor perforation, focal lymphovascular invasion ? CT abdomen/pelvis 07/13/2019-masslike thickening of the appendix with dilatation of the tip with mucosal enhancement, uterine fibroids, subcentimeter right lower quadrant mesenteric nodes ? Cycle 1 Xeloda 08/15/2019 ? Cycle 2 Xeloda 09/05/2019 ? Cycle 3 Xeloda 09/26/2019 ? Cycle 4 Xeloda started late July  2. HIV positive 3. Hypertension 4. Family history of colon and prostate cancer 5. Gastroesophageal reflux disease 6. Bilateral foot numbness/pain-gabapentin prescribed 7. Iron deficiency anemia-progressive    Disposition: Shirley White appears stable.  She was last seen here greater than 2 months ago.  She reports taking Xeloda for multiple cycles over the past few months.  The dates of Xeloda treatment are unclear.  We will contact the dispensing pharmacy to clarify the dose and number of cycles she has received.  She will hold Xeloda for now.  Shirley White has progressive microcytic anemia.  The anemia is likely secondary to menorrhagia.  She will resume iron with an oral preparation.  She knows to dose the iron at least several hours after the Fleming administration.  This [place] will return for a  CBC in 1 week.  She will be scheduled for an office visit in 2 weeks.  She will contact us tomorrow with a pill count.  We will confirm Xeloda delivery dates from her pharmacy.  She will receive an influenza vaccine in 1 week.  Shirley Coder, MD  02/06/2020  4:30 PM

## 2020-02-07 ENCOUNTER — Telehealth: Payer: Self-pay | Admitting: Oncology

## 2020-02-07 ENCOUNTER — Telehealth: Payer: Self-pay | Admitting: *Deleted

## 2020-02-07 MED ORDER — FERROUS SULFATE 220 (44 FE) MG/5ML PO ELIX: 220.0000 mg | ORAL_SOLUTION | Freq: Three times a day (TID) | ORAL | 2 refills | Status: DC

## 2020-02-07 NOTE — Telephone Encounter (Addendum)
Left VM requesting return call for date on Xeloda bottle and how many pills she has left in bottle. Called [facility] and spoke w/Shelly. She reports last shipment to patient was 12/06/19 and she has no refills. Called patient back and she reports she received the Xeloda pills on 12/05/19 and was taking 5/day. Says her last dose was sometime in September and that she has #10 pills left. Also requesting to have all her appointments start at 1530 due to working 0645 to 1500 daily and has to catch the bus here from job site. Notified scheduler to make changes for her.

## 2020-02-07 NOTE — Telephone Encounter (Signed)
Scheduled appointments per 10/25 los. Called patient, no answer. Left message for patient with appointment date and time.

## 2020-02-08 ENCOUNTER — Telehealth: Payer: Self-pay | Admitting: *Deleted

## 2020-02-08 DIAGNOSIS — C189 Malignant neoplasm of colon, unspecified: Secondary | ICD-10-CM

## 2020-02-08 MED ORDER — CAPECITABINE 500 MG PO TABS: ORAL_TABLET | ORAL | 0 refills | Status: DC

## 2020-02-08 NOTE — Telephone Encounter (Signed)
Refilled her Xeloda w/[facility] via phone. Called patient and instructed he to call pharmacy tomorrow to arrange for her delivery of drug. Do not start medication until she sees Dr. Benay Spice. Just hold on to medication till after her office visit. She verbalizes understanding.

## 2020-02-09 ENCOUNTER — Telehealth: Payer: Self-pay | Admitting: Oncology

## 2020-02-09 NOTE — Telephone Encounter (Signed)
Rescheduled appointments per secure chat with RN Manuela Schwartz. Called patient, no answer. Left message for patient with appointment date and time.

## 2020-02-09 NOTE — Progress Notes (Addendum)
The following Medication: Shirley White has been approved thru [employer]. Approved for 2 doses on 02/09/2020.  Assistance ID: [id number] . Medication is ordered as Assistance to have on hand prior to treatment. First DOS: 02/15/2020 Next DOS:02/28/2020 **additional doses, require Patient Financials to be for processing.

## 2020-02-10 ENCOUNTER — Ambulatory Visit: Payer: Self-pay | Admitting: Infectious Diseases

## 2020-02-13 ENCOUNTER — Other Ambulatory Visit: Payer: Self-pay

## 2020-02-13 ENCOUNTER — Inpatient Hospital Stay: Payer: Self-pay

## 2020-02-13 ENCOUNTER — Telehealth: Payer: Self-pay | Admitting: *Deleted

## 2020-02-13 ENCOUNTER — Inpatient Hospital Stay: Payer: Self-pay | Attending: Oncology

## 2020-02-13 ENCOUNTER — Ambulatory Visit: Payer: Self-pay

## 2020-02-13 DIAGNOSIS — K219 Gastro-esophageal reflux disease without esophagitis: Secondary | ICD-10-CM | POA: Insufficient documentation

## 2020-02-13 DIAGNOSIS — Z8042 Family history of malignant neoplasm of prostate: Secondary | ICD-10-CM | POA: Insufficient documentation

## 2020-02-13 DIAGNOSIS — R200 Anesthesia of skin: Secondary | ICD-10-CM | POA: Insufficient documentation

## 2020-02-13 DIAGNOSIS — Z21 Asymptomatic human immunodeficiency virus [HIV] infection status: Secondary | ICD-10-CM | POA: Insufficient documentation

## 2020-02-13 DIAGNOSIS — C181 Malignant neoplasm of appendix: Secondary | ICD-10-CM | POA: Insufficient documentation

## 2020-02-13 DIAGNOSIS — Z23 Encounter for immunization: Secondary | ICD-10-CM

## 2020-02-13 DIAGNOSIS — Z800 Family history of malignant neoplasm of digestive organs: Secondary | ICD-10-CM | POA: Insufficient documentation

## 2020-02-13 DIAGNOSIS — D500 Iron deficiency anemia secondary to blood loss (chronic): Secondary | ICD-10-CM

## 2020-02-13 DIAGNOSIS — I10 Essential (primary) hypertension: Secondary | ICD-10-CM | POA: Insufficient documentation

## 2020-02-13 DIAGNOSIS — D509 Iron deficiency anemia, unspecified: Secondary | ICD-10-CM | POA: Insufficient documentation

## 2020-02-13 LAB — CBC WITH DIFFERENTIAL (CANCER CENTER ONLY)
Abs Immature Granulocytes: 0.01 10*3/uL (ref 0.00–0.07)
Basophils Absolute: 0.1 10*3/uL (ref 0.0–0.1)
Basophils Relative: 1 %
Eosinophils Absolute: 0.1 10*3/uL (ref 0.0–0.5)
Eosinophils Relative: 3 %
HCT: 24.2 % — ABNORMAL LOW (ref 36.0–46.0)
Hemoglobin: 7.7 g/dL — ABNORMAL LOW (ref 12.0–15.0)
Immature Granulocytes: 0 %
Lymphocytes Relative: 39 %
Lymphs Abs: 1.9 10*3/uL (ref 0.7–4.0)
MCH: 22.1 pg — ABNORMAL LOW (ref 26.0–34.0)
MCHC: 31.8 g/dL (ref 30.0–36.0)
MCV: 69.3 fL — ABNORMAL LOW (ref 80.0–100.0)
Monocytes Absolute: 0.5 10*3/uL (ref 0.1–1.0)
Monocytes Relative: 9 %
Neutro Abs: 2.4 10*3/uL (ref 1.7–7.7)
Neutrophils Relative %: 48 %
Platelet Count: 417 10*3/uL — ABNORMAL HIGH (ref 150–400)
RBC: 3.49 MIL/uL — ABNORMAL LOW (ref 3.87–5.11)
RDW: 18.1 % — ABNORMAL HIGH (ref 11.5–15.5)
WBC Count: 5 10*3/uL (ref 4.0–10.5)
nRBC: 0 % (ref 0.0–0.2)

## 2020-02-13 LAB — SAMPLE TO BLOOD BANK

## 2020-02-13 MED ORDER — INFLUENZA VAC SPLIT QUAD 0.5 ML IM SUSY
PREFILLED_SYRINGE | INTRAMUSCULAR | Status: AC
  Filled 2020-02-13: qty 0.5

## 2020-02-13 MED ORDER — INFLUENZA VAC SPLIT QUAD 0.5 ML IM SUSY
0.5000 mL | PREFILLED_SYRINGE | Freq: Once | INTRAMUSCULAR | Status: AC
  Administered 2020-02-13: 0.5 mL via INTRAMUSCULAR

## 2020-02-13 NOTE — Telephone Encounter (Signed)
Hgb returned today at 7.7--per Dr. Benay Spice, she needs IV iron and not blood. She agrees to come 02/15/20 at 1:45 for OV and then IV feraheme afterwards. She has not picked up the liquid oral iron yet. Encouraged her to pick this up and get started as soon as possible.

## 2020-02-13 NOTE — Patient Instructions (Signed)
Influenza Virus Vaccine injection What is this medicine? INFLUENZA VIRUS VACCINE (in floo EN zuh VAHY ruhs vak SEEN) helps to reduce the risk of getting influenza also known as the flu. The vaccine only helps protect you against some strains of the flu. This medicine may be used for other purposes; ask your health care provider or pharmacist if you have questions. COMMON BRAND NAME(S): Afluria, Afluria Quadrivalent, Agriflu, Alfuria, FLUAD, Fluarix, Fluarix Quadrivalent, Flublok, Flublok Quadrivalent, FLUCELVAX, FLUCELVAX Quadrivalent, Flulaval, Flulaval Quadrivalent, Fluvirin, Fluzone, Fluzone High-Dose, Fluzone Intradermal, Fluzone Quadrivalent What should I tell my health care provider before I take this medicine? They need to know if you have any of these conditions:  bleeding disorder like hemophilia  fever or infection  Guillain-Barre syndrome or other neurological problems  immune system problems  infection with the human immunodeficiency virus (HIV) or AIDS  low blood platelet counts  multiple sclerosis  an unusual or allergic reaction to influenza virus vaccine, latex, other medicines, foods, dyes, or preservatives. Different brands of vaccines contain different allergens. Some may contain latex or eggs. Talk to your doctor about your allergies to make sure that you get the right vaccine.  pregnant or trying to get pregnant  breast-feeding How should I use this medicine? This vaccine is for injection into a muscle or under the skin. It is given by a health care professional. A copy of Vaccine Information Statements will be given before each vaccination. Read this sheet carefully each time. The sheet may change frequently. Talk to your healthcare provider to see which vaccines are right for you. Some vaccines should not be used in all age groups. Overdosage: If you think you have taken too much of this medicine contact a poison control center or emergency room at once. NOTE:  This medicine is only for you. Do not share this medicine with others. What if I miss a dose? This does not apply. What may interact with this medicine?  chemotherapy or radiation therapy  medicines that lower your immune system like etanercept, anakinra, infliximab, and adalimumab  medicines that treat or prevent blood clots like warfarin  phenytoin  steroid medicines like prednisone or cortisone  theophylline  vaccines This list may not describe all possible interactions. Give your health care provider a list of all the medicines, herbs, non-prescription drugs, or dietary supplements you use. Also tell them if you smoke, drink alcohol, or use illegal drugs. Some items may interact with your medicine. What should I watch for while using this medicine? Report any side effects that do not go away within 3 days to your doctor or health care professional. Call your health care provider if any unusual symptoms occur within 6 weeks of receiving this vaccine. You may still catch the flu, but the illness is not usually as bad. You cannot get the flu from the vaccine. The vaccine will not protect against colds or other illnesses that may cause fever. The vaccine is needed every year. What side effects may I notice from receiving this medicine? Side effects that you should report to your doctor or health care professional as soon as possible:  allergic reactions like skin rash, itching or hives, swelling of the face, lips, or tongue Side effects that usually do not require medical attention (report to your doctor or health care professional if they continue or are bothersome):  fever  headache  muscle aches and pains  pain, tenderness, redness, or swelling at the injection site  tiredness This list may not describe  all possible side effects. Call your doctor for medical advice about side effects. You may report side effects to FDA at 1-800-FDA-1088. Where should I keep my medicine? The  vaccine will be given by a health care professional in a clinic, pharmacy, doctor's office, or other health care setting. You will not be given vaccine doses to store at home. NOTE: This sheet is a summary. It may not cover all possible information. If you have questions about this medicine, talk to your doctor, pharmacist, or health care provider.  2020 Elsevier/Gold Standard (2018-02-23 08:45:43)  

## 2020-02-15 ENCOUNTER — Encounter: Payer: Self-pay | Admitting: General Practice

## 2020-02-15 ENCOUNTER — Other Ambulatory Visit: Payer: Self-pay

## 2020-02-15 ENCOUNTER — Inpatient Hospital Stay (HOSPITAL_BASED_OUTPATIENT_CLINIC_OR_DEPARTMENT_OTHER): Payer: Self-pay | Admitting: Nurse Practitioner

## 2020-02-15 ENCOUNTER — Inpatient Hospital Stay: Payer: Self-pay

## 2020-02-15 ENCOUNTER — Encounter: Payer: Self-pay | Admitting: Oncology

## 2020-02-15 ENCOUNTER — Encounter: Payer: Self-pay | Admitting: Nurse Practitioner

## 2020-02-15 VITALS — BP 123/61 | HR 91 | Temp 97.5°F | Resp 18 | Ht <= 58 in | Wt 102.4 lb

## 2020-02-15 VITALS — BP 123/63 | HR 72 | Temp 98.9°F | Resp 18

## 2020-02-15 DIAGNOSIS — D500 Iron deficiency anemia secondary to blood loss (chronic): Secondary | ICD-10-CM

## 2020-02-15 DIAGNOSIS — C189 Malignant neoplasm of colon, unspecified: Secondary | ICD-10-CM

## 2020-02-15 MED ORDER — SODIUM CHLORIDE 0.9 % IV SOLN
510.0000 mg | Freq: Once | INTRAVENOUS | Status: AC
  Administered 2020-02-15: 510 mg via INTRAVENOUS
  Filled 2020-02-15: qty 17

## 2020-02-15 MED ORDER — SODIUM CHLORIDE 0.9 % IV SOLN
Freq: Once | INTRAVENOUS | Status: AC
  Filled 2020-02-15: qty 250

## 2020-02-15 NOTE — Progress Notes (Signed)
  [facility] OFFICE PROGRESS NOTE   Diagnosis: Colon cancer  INTERVAL HISTORY:   Shirley White returns for follow-up prior to proceeding with IV iron.  She reports a monthly menstrual cycle with heavy bleeding for 5 days.  No other bleeding.  She has not started the oral iron as recommended at the time of her last visit.  She plans to pick up the iron from her pharmacy today.  She reports energy level varies.  No significant dyspnea with routine activity.  Objective:  Vital signs in last 24 hours:  Blood pressure 123/61, pulse 91, temperature (!) 97.5 F (36.4 C), temperature source Tympanic, resp. rate 18, height _0  (1.448 m), weight 102 lb 6.4 oz (46.4 kg), SpO2 100 %.    Resp: Lungs clear bilaterally. Cardio: Regular rate and rhythm. GI: Abdomen soft and nontender.  No hepatomegaly. Vascular: No leg edema.    Lab Results:  Lab Results  Component Value Date   WBC 5.0 02/13/2020   HGB 7.7 (L) 02/13/2020   HCT 24.2 (L) 02/13/2020   MCV 69.3 (L) 02/13/2020   PLT 417 (H) 02/13/2020   NEUTROABS 2.4 02/13/2020    Imaging:  No results found.  Medications: I have reviewed the patient's current medications.  Assessment/Plan: 1. 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 Right colectomy 07/14/2019, T3N0 tumor, 0/35 lymph nodes, no loss of mismatch repair protein expression, no macroscopic tumor perforation, focal lymphovascular invasion ? CT abdomen/pelvis 07/13/2019-masslike thickening of the appendix with dilatation of the tip with mucosal enhancement, uterine fibroids, subcentimeter right lower quadrant mesenteric nodes ? Cycle 1 Xeloda 08/15/2019 ? Cycle 2 Xeloda 09/05/2019 ? Cycle 3 Xeloda 09/26/2019 ? Cycle 4 Xeloda started late July  2. HIV positive 3. Hypertension 4. Family history of colon and prostate cancer 5. Gastroesophageal reflux disease 6. Bilateral foot numbness/pain-gabapentin prescribed 7. Iron deficiency  anemia-progressive; IV iron 02/15/2020, 02/23/2020  Disposition: [name] appears stable.  We reviewed the CBC from 02/13/2020.  She has a progressive microcytic anemia secondary to iron deficiency/menstrual blood loss.  She will begin oral iron as previously recommended.  We discussed IV Feraheme weekly x2.  She understands the potential for an allergic reaction, potentially severe.  She agrees to proceed, first dose today.  Signs/symptoms suggestive of progressive anemia reviewed with her at today's visit.  She understands to contact the office should she develop any of these.  She will return for lab, follow-up and the second dose of Feraheme on 02/23/2020.    Ned Card ANP/GNP-BC   02/15/2020  1:49 PM

## 2020-02-15 NOTE — Progress Notes (Signed)
[facility] CSW Progress Notes  Email from [employer], [place].  States patient has questions/concerns about housing.  Met w patient in chemo waiting area.  Her concern is finding a one bedroom unit that is better maintained, pest free and willing to accept her Section 8 voucher.  She currently lives in a 2 bedroom unit w her two adult sons - she wants sons to move into their own place.  She has searched on various housing lists, including one given her by [employer], but has been unable to locate a property.  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  CSW provided list of various income based properties in [place].  Edwyna Shell, LCSW Clinical Social Worker Phone:  [phone]

## 2020-02-15 NOTE — Progress Notes (Signed)
Met with patient by elevators per her request whom has a concern with housing.  Sent email to [facility] for follow up.  Patient has my contact name and number for any additional financial questions or concerns.

## 2020-02-15 NOTE — Patient Instructions (Signed)

## 2020-02-16 ENCOUNTER — Telehealth: Payer: Self-pay | Admitting: Nurse Practitioner

## 2020-02-16 NOTE — Telephone Encounter (Signed)
Scheduled appointment per 11/3 los. Called patient, no answer. Left message for patient with appointment date and time.  

## 2020-02-22 ENCOUNTER — Telehealth: Payer: Self-pay | Admitting: *Deleted

## 2020-02-22 ENCOUNTER — Inpatient Hospital Stay: Payer: Self-pay

## 2020-02-22 NOTE — Telephone Encounter (Signed)
Left VM that she is not able to come today for iron infusion and asking if it can be done tomorrow after her OV w/Dr. Benay Spice? Per infusion, if she can come in tomorrow at 3:00 instead of 3:30, they can work her feraheme treatment in. Have tried to call her, her mother and boyfriend without success.

## 2020-02-23 ENCOUNTER — Inpatient Hospital Stay: Payer: Self-pay

## 2020-02-23 ENCOUNTER — Telehealth: Payer: Self-pay | Admitting: *Deleted

## 2020-02-23 ENCOUNTER — Other Ambulatory Visit: Payer: Self-pay

## 2020-02-23 ENCOUNTER — Ambulatory Visit: Payer: Self-pay | Admitting: Nurse Practitioner

## 2020-02-23 ENCOUNTER — Inpatient Hospital Stay: Payer: Self-pay | Admitting: Oncology

## 2020-02-23 NOTE — Telephone Encounter (Signed)
Called to cancel appointment today because of the rain. She rides bus and does not want to be standing at bus stop in the rain. She gets off work at 3 pm and can only be here at 3:30 pm at the earliest. Could come anytime on Saturday (would have a ride). Per infusion, unable to give feraheme on a Saturday due to potential for reaction. Will try to coordinate a lab at 3:30 and infusion at 4 pm in a bed, so MD can see her in privacy in infusion area. Gave patient the phone # for [facility] transportation service to see if they would be able to pick her up from work and bring her to [facility] and then home. Instructed her NOT to take the chemo pills till she is seen by Dr. Benay Spice. Encouraged her she must answer her phone for the transporter to pick her up. Scheduling message sent.

## 2020-02-24 ENCOUNTER — Telehealth: Payer: Self-pay | Admitting: Oncology

## 2020-02-24 NOTE — Telephone Encounter (Signed)
Scheduled appt per 11/11 sch msg - unable to reach pt. Left message for patient with appt date and time

## 2020-02-27 ENCOUNTER — Telehealth: Payer: Self-pay | Admitting: Internal Medicine

## 2020-02-27 NOTE — Telephone Encounter (Signed)
°   Shirley White Yo DOB: [birth year] MRN: [id number]   RIDER WAIVER AND RELEASE OF LIABILITY  For purposes of improving physical access to our facilities, [facility] is pleased to partner with third parties to provide [facility] patients or other authorized individuals the option of convenient, on-demand ground transportation services (the [employer]) through use of the technology service that enables users to request on-demand ground transportation from independent third-party providers.  By opting to use and accept these [employer], I, the undersigned, hereby agree on behalf of myself, and on behalf of any minor child using the [employer] for whom I am the parent or legal guardian, as follows:  1. Government social research officer provided to me are provided by independent third-party transportation providers who are not [employer] or employees and who are unaffiliated with [employer]. 2. [employer] is neither a transportation carrier nor a common or public carrier. 3. [facility] has no control over the quality or safety of the transportation that occurs as a result of the [employer]. 4. [facility] cannot guarantee that any third-party transportation provider will complete any arranged transportation service. 5. [facility] makes no representation, warranty, or guarantee regarding the reliability, timeliness, quality, safety, suitability, or availability of any of the Transport Services or that they will be error free. 6. I fully understand that traveling by vehicle involves risks and dangers of serious bodily injury, including permanent disability, paralysis, and death. I agree, on behalf of myself and on behalf of any minor child using the Transport Services for whom I am the parent or legal guardian, that the entire risk arising out of my use of the [employer] remains solely with me, to the maximum extent permitted under applicable law. 7. The [employer] are provided as is and as available. [facility] disclaims all representations and warranties, express, implied or statutory, not expressly set out in these terms, including the implied warranties of merchantability and fitness for a particular purpose. 8. I hereby waive and release [facility], its agents, employees, officers, directors, representatives, insurers, attorneys, assigns, successors, subsidiaries, and affiliates from any and all past, present, or future claims, demands, liabilities, actions, causes of action, or suits of any kind directly or indirectly arising from acceptance and use of the [employer]. 9. I further waive and release [facility] and its affiliates from all present and future liability and responsibility for any injury or death to persons or damages to property caused by or related to the use of the [employer]. 10. I have read this Waiver and Release of Liability, and I understand the terms used in it and their legal significance. This Waiver is freely and voluntarily given with the understanding that my right (as well as the right of any minor child for whom I am the parent or legal guardian using the [employer]) to legal recourse against [facility] in connection with the [employer] is knowingly surrendered in return for use of these services.   I attest that I read the consent document to Shirley White, gave Shirley White the opportunity to ask questions and answered the questions asked (if any). I affirm that Shirley White then provided consent for she's participation in this program.     Shirley White

## 2020-02-28 ENCOUNTER — Inpatient Hospital Stay: Payer: Self-pay

## 2020-02-28 ENCOUNTER — Other Ambulatory Visit: Payer: Self-pay

## 2020-02-28 ENCOUNTER — Telehealth: Payer: Self-pay | Admitting: *Deleted

## 2020-02-28 ENCOUNTER — Inpatient Hospital Stay (HOSPITAL_BASED_OUTPATIENT_CLINIC_OR_DEPARTMENT_OTHER): Payer: Self-pay | Admitting: Oncology

## 2020-02-28 VITALS — BP 132/85 | HR 78 | Temp 97.7°F | Resp 18 | Ht <= 58 in | Wt 101.8 lb

## 2020-02-28 VITALS — BP 127/71 | HR 81 | Temp 98.1°F | Resp 16

## 2020-02-28 DIAGNOSIS — D500 Iron deficiency anemia secondary to blood loss (chronic): Secondary | ICD-10-CM

## 2020-02-28 LAB — CBC WITH DIFFERENTIAL (CANCER CENTER ONLY)
Abs Immature Granulocytes: 0.01 10*3/uL (ref 0.00–0.07)
Basophils Absolute: 0.1 10*3/uL (ref 0.0–0.1)
Basophils Relative: 2 %
Eosinophils Absolute: 0.2 10*3/uL (ref 0.0–0.5)
Eosinophils Relative: 3 %
HCT: 34.7 % — ABNORMAL LOW (ref 36.0–46.0)
Hemoglobin: 11.2 g/dL — ABNORMAL LOW (ref 12.0–15.0)
Immature Granulocytes: 0 %
Lymphocytes Relative: 27 %
Lymphs Abs: 1.8 10*3/uL (ref 0.7–4.0)
MCH: 24.3 pg — ABNORMAL LOW (ref 26.0–34.0)
MCHC: 32.3 g/dL (ref 30.0–36.0)
MCV: 75.4 fL — ABNORMAL LOW (ref 80.0–100.0)
Monocytes Absolute: 0.7 10*3/uL (ref 0.1–1.0)
Monocytes Relative: 10 %
Neutro Abs: 4 10*3/uL (ref 1.7–7.7)
Neutrophils Relative %: 58 %
Platelet Count: 367 10*3/uL (ref 150–400)
RBC: 4.6 MIL/uL (ref 3.87–5.11)
RDW: 26.6 % — ABNORMAL HIGH (ref 11.5–15.5)
WBC Count: 6.7 10*3/uL (ref 4.0–10.5)
nRBC: 0 % (ref 0.0–0.2)

## 2020-02-28 LAB — CMP (CANCER CENTER ONLY)
ALT: 8 U/L (ref 0–44)
AST: 11 U/L — ABNORMAL LOW (ref 15–41)
Albumin: 3.9 g/dL (ref 3.5–5.0)
Alkaline Phosphatase: 52 U/L (ref 38–126)
Anion gap: 8 (ref 5–15)
BUN: 17 mg/dL (ref 6–20)
CO2: 22 mmol/L (ref 22–32)
Calcium: 8.9 mg/dL (ref 8.9–10.3)
Chloride: 108 mmol/L (ref 98–111)
Creatinine: 0.76 mg/dL (ref 0.44–1.00)
GFR, Estimated: >60 mL/min (ref >60)
Glucose, Bld: 87 mg/dL (ref 70–99)
Potassium: 4.1 mmol/L (ref 3.5–5.1)
Sodium: 138 mmol/L (ref 135–145)
Total Bilirubin: 0.7 mg/dL (ref 0.3–1.2)
Total Protein: 8.3 g/dL — ABNORMAL HIGH (ref 6.5–8.1)

## 2020-02-28 MED ORDER — SODIUM CHLORIDE 0.9 % IV SOLN
Freq: Once | INTRAVENOUS | Status: AC
  Filled 2020-02-28: qty 250

## 2020-02-28 MED ORDER — SODIUM CHLORIDE 0.9 % IV SOLN
510.0000 mg | Freq: Once | INTRAVENOUS | Status: AC
  Administered 2020-02-28: 510 mg via INTRAVENOUS
  Filled 2020-02-28: qty 510

## 2020-02-28 NOTE — Patient Instructions (Signed)

## 2020-02-28 NOTE — Telephone Encounter (Signed)
On hold w/[facility] pharmacy for over 29 minutes to arrange delivery of her Xeloda with no answer. Left VM with patient's information and request to call her for delivery arrangements. Provided patient w/number again and encouraged her to answer phone for any unknown numbers in case it is them. She will also try to call again. She will call RN when she receives the shipment for directions.

## 2020-02-28 NOTE — Progress Notes (Signed)
Reports she has not received the Xeloda yet--has called pharmacy and not ever able to get through to them and gave up. Has not started ferrous sulfate either--[facility] has called her to deliver the med to her home.

## 2020-02-28 NOTE — Progress Notes (Signed)
  [facility] OFFICE PROGRESS NOTE   Diagnosis: Colon cancer  INTERVAL HISTORY:   Shirley White returns for a scheduled visit.  She missed a visit last week.  She received IV iron on 02/15/2020.  She denies bleeding other than with the menstrual cycle.  She is not taking oral iron. Shirley White reports she has 10 Xeloda pills at home.  She does not think she has taken Xeloda since July or August.  Objective:  Vital signs in last 24 hours:  Blood pressure 132/85, pulse 78, temperature 97.7 F (36.5 C), temperature source Tympanic, resp. rate 18, height _0  (1.448 m), weight 101 lb 12.8 oz (46.2 kg), SpO2 (!) 10 %.    HEENT: No thrush or ulcers Resp: Lungs clear bilaterally Cardio: Regular rate and rhythm, 2/6 systolic murmur GI: Nontender, no hepatomegaly, no mass Vascular: No leg edema  Skin: Skin thickening and dry desquamation at the hands and feet  Portacath/PICC-without erythema  Lab Results:  Lab Results  Component Value Date   WBC 6.7 02/28/2020   HGB 11.2 (L) 02/28/2020   HCT 34.7 (L) 02/28/2020   MCV 75.4 (L) 02/28/2020   PLT 367 02/28/2020   NEUTROABS 4.0 02/28/2020    CMP  Lab Results  Component Value Date   NA 138 02/28/2020   K 4.1 02/28/2020   CL 108 02/28/2020   CO2 22 02/28/2020   GLUCOSE 87 02/28/2020   BUN 17 02/28/2020   CREATININE 0.76 02/28/2020   CALCIUM 8.9 02/28/2020   PROT 8.3 (H) 02/28/2020   ALBUMIN 3.9 02/28/2020   AST 11 (L) 02/28/2020   ALT 8 02/28/2020   ALKPHOS 52 02/28/2020   BILITOT 0.7 02/28/2020   GFRNONAA >60 02/28/2020   GFRAA >60 11/28/2019    Lab Results  Component Value Date   CEA1 1.5 07/19/2019    Lab Results  Component Value Date   INR 1.09 07/14/2017    Imaging:  No results found.  Medications: I have reviewed the patient's current medications.   Assessment/Plan: 1. 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 Right colectomy 07/14/2019, T3N0 tumor, 0/35 lymph  nodes, no loss of mismatch repair protein expression, no macroscopic tumor perforation, focal lymphovascular invasion ? CT abdomen/pelvis 07/13/2019-masslike thickening of the appendix with dilatation of the tip with mucosal enhancement, uterine fibroids, subcentimeter right lower quadrant mesenteric nodes ? Cycle 1 Xeloda 08/15/2019 ? Cycle 2 Xeloda 09/05/2019 ? Cycle 3 Xeloda 09/26/2019 ? Cycle 4 Xeloda started late July  2. HIV positive 3. Hypertension 4. Family history of colon and prostate cancer 5. Gastroesophageal reflux disease 6. Bilateral foot numbness/pain-gabapentin prescribed 7. Iron deficiency anemia-progressive; IV iron 02/15/2020, 02/23/2020, 10/04/2019, 02/15/2020, 02/28/2020    Disposition: Shirley White appears unchanged.  The hemoglobin is higher today.  She will receive another dose of IV iron today.  We discussed the indication for resuming adjuvant capecitabine.  She has been off of chemotherapy for approximately 3 months.  She understands it is unclear whether she will benefit from additional capecitabine.  Shirley White wants to complete the complete course of adjuvant capecitabine.  We stressed the importance of compliance with the treatment regimen and follow-up.  We will contact the pharmacy to arrange for shipment of the next cycle of Xeloda.  She will be scheduled for a follow-up office and lab visit when she completes a cycle of Xeloda. Betsy Coder, MD  02/28/2020  4:26 PM

## 2020-02-29 ENCOUNTER — Telehealth: Payer: Self-pay | Admitting: Oncology

## 2020-02-29 NOTE — Telephone Encounter (Signed)
Scheduled appointments per 11/16 los. Called patient, no answer. Left message for patient with appointment date and time.

## 2020-03-01 ENCOUNTER — Encounter: Payer: Self-pay | Admitting: Infectious Diseases

## 2020-03-01 ENCOUNTER — Telehealth: Payer: Self-pay | Admitting: *Deleted

## 2020-03-01 ENCOUNTER — Ambulatory Visit (INDEPENDENT_AMBULATORY_CARE_PROVIDER_SITE_OTHER): Payer: Self-pay | Admitting: Infectious Diseases

## 2020-03-01 ENCOUNTER — Other Ambulatory Visit: Payer: Self-pay

## 2020-03-01 VITALS — BP 138/83 | HR 90 | Temp 98.4°F | Ht 61.0 in | Wt 101.0 lb

## 2020-03-01 DIAGNOSIS — R87612 Low grade squamous intraepithelial lesion on cytologic smear of cervix (LGSIL): Secondary | ICD-10-CM

## 2020-03-01 DIAGNOSIS — Z21 Asymptomatic human immunodeficiency virus [HIV] infection status: Secondary | ICD-10-CM

## 2020-03-01 DIAGNOSIS — C189 Malignant neoplasm of colon, unspecified: Secondary | ICD-10-CM

## 2020-03-01 DIAGNOSIS — D500 Iron deficiency anemia secondary to blood loss (chronic): Secondary | ICD-10-CM

## 2020-03-01 DIAGNOSIS — B20 Human immunodeficiency virus [HIV] disease: Secondary | ICD-10-CM

## 2020-03-01 MED ORDER — BIKTARVY 50-200-25 MG PO TABS: ORAL_TABLET | ORAL | 5 refills | Status: DC

## 2020-03-01 NOTE — Progress Notes (Signed)
Name: Shirley White  DOB: 01/03/1971 MRN: [id number] PCP: Ladell Pier, MD     Brief Narrative:  Shirley White is a 49 y.o. woman with HIV disease originally diagnosed in 2005. Poor adherence with AIDS qualifying CD4s since 2016 with CD4 nadir 30.  HIV Risk: heterosexual.  History of OIs: esophageal candidiasis.   Previous Regimens: . Complera 2011 >> difficulty swallowing regimen . Emtriva + Edurant + Viread 2011 through 01-2014 . Odefsey 2016 . Tiviay + Truvada 2018  . Biktarvy 2019  Genotypes: . 05-2295 - sensitive . 12-8919 - sensitive  .   Subjective:   Chief Complaint  Patient presents with  . Follow-up     HPI: Doing well on Biktarvy. Minimal missed doses and has done well continuing this. No side effects that are concerning. Will need to help remind her for ADAP renewal in January. Has trouble getting the iron in due to constipation, but would like to try the oral liquid to see if she can avoid transfusions.   Working at [employer] now - which she enjoys quite a bit. Split from her longer term boyfriend. Still working through that. Has had some worsening features of depression/saddness. Going to a women's conference this coming weekend which she is looking forward to.   She has a FU scheduled with PCP and planning on pap smear at that visit. H/O abnormal tests with previous intervention with Dr. Denman George. Has not seen her in some time.    Review of Systems  Constitutional: Negative for appetite change, chills, fatigue, fever and unexpected weight change.  HENT: Negative for mouth sores, sore throat and trouble swallowing.   Eyes: Negative for pain and visual disturbance.  Respiratory: Negative for cough and shortness of breath.   Cardiovascular: Negative for chest pain.  Gastrointestinal: Negative for abdominal pain, diarrhea and nausea.  Genitourinary: Negative for dysuria, menstrual problem and pelvic pain.  Musculoskeletal: Negative for back pain  and neck pain.  Skin: Negative for color change and rash.  Neurological: Positive for numbness (burning numbness to b/l feet). Negative for weakness and headaches.  Hematological: Negative for adenopathy.  Psychiatric/Behavioral: Negative for dysphoric mood. The patient is not nervous/anxious.      Past Medical History:  Diagnosis Date  . Anemia   . GERD (gastroesophageal reflux disease)    no meds  . Heart murmur   . HIV disease [facility])     Outpatient Medications Prior to Visit  Medication Sig Dispense Refill  . amLODipine (NORVASC) 10 MG tablet Take 1 tablet (10 mg total) by mouth daily. 30 tablet 5  . capecitabine (XELODA) 500 MG tablet Take 3 tablets by mouth in AM and 2 tablets in PM. Take with food. Take for 14 days, then hold for 7 days. Repeat every 21 days. Do not start until you see MD 60 tablet 0  . ferrous sulfate 220 (44 Fe) MG/5ML solution Take 5 mLs (220 mg total) by mouth 3 (three) times daily with meals. Take at least 2 hours after Biktarvy 450 mL 2  . gabapentin (NEURONTIN) 300 MG capsule TAKE 1 CAPSULE(300 MG) BY MOUTH THREE TIMES DAILY 90 capsule 3  . BIKTARVY 50-200-25 MG TABS tablet TAKE 1 TABLET BY MOUTH DAILY, TO TAKE AT THE SAME TIME EACH DAY WITH OR WITHOUT FOOD 30 tablet 5  . lisinopril (ZESTRIL) 5 MG tablet TAKE 1 TABLET(5 MG) BY MOUTH DAILY. 30 tablet 5  . prochlorperazine (COMPAZINE) 10 MG tablet Take 1 tablet (10 mg  total) by mouth every 6 (six) hours as needed for nausea. (Patient not taking: Reported on 02/06/2020) 30 tablet 1  . acetaminophen (TYLENOL) 325 MG tablet Take 2 tablets (650 mg total) by mouth every 6 (six) hours as needed. (Patient not taking: Reported on 03/01/2020)     No facility-administered medications prior to visit.     No Known Allergies  Social History   Tobacco Use  . Smoking status: Never Smoker  . Smokeless tobacco: Never Used  Vaping Use  . Vaping Use: Never used  Substance Use Topics  . Alcohol use: Yes     Alcohol/week: 1.0 standard drink    Types: 1 Cans of beer per week    Comment: occa  . Drug use: Never    Social History   Substance and Sexual Activity  Sexual Activity Not Currently  . Partners: Male  . Birth control/protection: Condom     Objective:   Vitals:   03/01/20 1529  BP: 138/83  Pulse: 90  Temp: 98.4 F (36.9 C)  TempSrc: Oral  SpO2: 99%  Weight: 101 lb (45.8 kg)  Height: 5\' 1"  (1.549 m)   Body mass index is 19.08 kg/m.   Physical Exam Vitals reviewed.  Constitutional:      Comments: Well appearing and in good spirits today  HENT:     Mouth/Throat:     Mouth: No oral lesions.     Dentition: No dental abscesses.  Cardiovascular:     Rate and Rhythm: Normal rate.     Heart sounds: No murmur heard.   Pulmonary:     Effort: Pulmonary effort is normal. No respiratory distress.  Abdominal:     General: There is no distension.     Palpations: Abdomen is soft.     Tenderness: There is no abdominal tenderness.  Musculoskeletal:        General: No tenderness. Normal range of motion.  Skin:    General: Skin is warm and dry.     Findings: No rash.  Neurological:     Mental Status: She is alert and oriented to person, place, and time.  Psychiatric:        Judgment: Judgment normal.      Lab Results Lab Results  Component Value Date   WBC 6.7 02/28/2020   HGB 11.2 (L) 02/28/2020   HCT 34.7 (L) 02/28/2020   MCV 75.4 (L) 02/28/2020   PLT 367 02/28/2020    Lab Results  Component Value Date   CREATININE 0.76 02/28/2020   BUN 17 02/28/2020   NA 138 02/28/2020   K 4.1 02/28/2020   CL 108 02/28/2020   CO2 22 02/28/2020    Lab Results  Component Value Date   ALT 8 02/28/2020   AST 11 (L) 02/28/2020   ALKPHOS 52 02/28/2020   BILITOT 0.7 02/28/2020    Lab Results  Component Value Date   CHOL 193 03/01/2020   HDL 66 03/01/2020   LDLCALC 110 (H) 03/01/2020   TRIG 81 03/01/2020   CHOLHDL 2.9 03/01/2020   HIV 1 RNA Quant  Date Value   03/01/2020 <20 Copies/mL  10/12/2019 <20 NOT DETECTED copies/mL  06/23/2019 <20 NOT DETECTED copies/mL   CD4 T Cell Abs (/uL)  Date Value  03/01/2020 338 (L)  10/12/2019 328 (L)  06/23/2019 347 (L)     Assessment & Plan:   Problem List Items Addressed This Visit      High   HIV (human immunodeficiency virus infection) ([place]) (Chronic)  Doing well on Biktarvy, no changes warranted. Last CD4 > 300, repeat pertinent labs today for therapeutic monitoring.  Pap due, mammo due.  Vaccines updated.   RTC in 62m       Relevant Medications   bictegravir-emtricitabine-tenofovir AF (BIKTARVY) 50-200-25 MG TABS tablet     Unprioritized   LGSIL on Pap smear of cervix    Due for repeat - planning on repeat pap with PCP.       Iron deficiency anemia    Resuming PO liquid Fe supplement. Advised to space out 6 hours from [place] to avoid under-absorption.       Adenocarcinoma, colon ([place])    Appreciate care of oncology team      Relevant Medications   bictegravir-emtricitabine-tenofovir AF (BIKTARVY) 50-200-25 MG TABS tablet    Other Visit Diagnoses    Asymptomatic HIV infection ([place])    -  Primary   Relevant Medications   bictegravir-emtricitabine-tenofovir AF (BIKTARVY) 50-200-25 MG TABS tablet   Other Relevant Orders   HIV-1 RNA quant-no reflex-bld (Completed)   T-helper cell (CD4)- ([facility] only) (Completed)   Lipid panel (Completed)   AIDS ([place])       Relevant Medications   bictegravir-emtricitabine-tenofovir AF (BIKTARVY) 50-200-25 MG TABS tablet      Janene Madeira, MSN, NP-C [facility] [place] Pager: [phone] Office: [phone]  05/15/20  3:34 PM

## 2020-03-01 NOTE — Patient Instructions (Addendum)
Please continue your Biktarvy every day.   Please separate your iron liquid by 6 hours from your Biktarvy to make sure they both work well.   Please stop by the lab on your way out.   Plan to return in 4 months for a meeting with me and our financial team in January 2022 re-apply for ADAP.   Happy Birthday to you!

## 2020-03-01 NOTE — Telephone Encounter (Signed)
Left VM asking patient if she was able to speak w/pharmacy to arrange deliver of her xeloda. Left direct # for her to return call.

## 2020-03-02 LAB — T-HELPER CELL (CD4) - (RCID CLINIC ONLY)
CD4 % Helper T Cell: 21 % — ABNORMAL LOW (ref 33–65)
CD4 T Cell Abs: 338 /uL — ABNORMAL LOW (ref 400–1790)

## 2020-03-03 LAB — LIPID PANEL
Cholesterol: 193 mg/dL (ref <200)
HDL: 66 mg/dL (ref >=50)
LDL Cholesterol (Calc): 110 mg/dL (calc) — ABNORMAL HIGH
Non-HDL Cholesterol (Calc): 127 mg/dL (calc) (ref <130)
Total CHOL/HDL Ratio: 2.9 (calc) (ref <5.0)
Triglycerides: 81 mg/dL (ref <150)

## 2020-03-03 LAB — HIV-1 RNA QUANT-NO REFLEX-BLD
HIV 1 RNA Quant: <20 Copies/mL
HIV-1 RNA Quant, Log: <1.3 Log cps/mL

## 2020-03-06 ENCOUNTER — Telehealth: Payer: Self-pay | Admitting: *Deleted

## 2020-03-06 ENCOUNTER — Ambulatory Visit: Payer: Self-pay

## 2020-03-06 NOTE — Telephone Encounter (Signed)
Received her Xeloda on 03/03/20 and calling to ask when to start this cycle (#5)

## 2020-03-07 ENCOUNTER — Telehealth: Payer: Self-pay

## 2020-03-07 NOTE — Telephone Encounter (Signed)
Called both pt and mom left voice message on pts phone and verbal message with mom that pt is to begin Xeloda tomorrow

## 2020-03-15 ENCOUNTER — Ambulatory Visit: Payer: Self-pay

## 2020-03-20 ENCOUNTER — Telehealth: Payer: Self-pay | Admitting: Nurse Practitioner

## 2020-03-20 NOTE — Telephone Encounter (Signed)
Scheduled appt per 12/7 sch msg - left message for patient with new appt date and time and number for call back if reschedule is needed

## 2020-03-21 ENCOUNTER — Inpatient Hospital Stay: Payer: Self-pay

## 2020-03-21 ENCOUNTER — Inpatient Hospital Stay: Payer: Self-pay | Admitting: Nurse Practitioner

## 2020-03-26 ENCOUNTER — Inpatient Hospital Stay: Payer: Self-pay | Attending: Oncology

## 2020-03-26 ENCOUNTER — Inpatient Hospital Stay: Payer: Self-pay | Admitting: Nurse Practitioner

## 2020-03-27 ENCOUNTER — Encounter: Payer: Self-pay | Admitting: Internal Medicine

## 2020-03-27 ENCOUNTER — Other Ambulatory Visit: Payer: Self-pay

## 2020-03-27 ENCOUNTER — Ambulatory Visit: Payer: Self-pay | Attending: Internal Medicine | Admitting: Internal Medicine

## 2020-03-27 ENCOUNTER — Ambulatory Visit: Payer: Self-pay | Admitting: Internal Medicine

## 2020-03-27 VITALS — BP 128/70 | HR 82 | Resp 16 | Wt 106.6 lb

## 2020-03-27 DIAGNOSIS — R058 Other specified cough: Secondary | ICD-10-CM

## 2020-03-27 DIAGNOSIS — T464X5A Adverse effect of angiotensin-converting-enzyme inhibitors, initial encounter: Secondary | ICD-10-CM

## 2020-03-27 DIAGNOSIS — Z124 Encounter for screening for malignant neoplasm of cervix: Secondary | ICD-10-CM

## 2020-03-27 DIAGNOSIS — G4700 Insomnia, unspecified: Secondary | ICD-10-CM

## 2020-03-27 DIAGNOSIS — Z1231 Encounter for screening mammogram for malignant neoplasm of breast: Secondary | ICD-10-CM

## 2020-03-27 DIAGNOSIS — I10 Essential (primary) hypertension: Secondary | ICD-10-CM

## 2020-03-27 MED ORDER — LOSARTAN POTASSIUM 25 MG PO TABS: 12.5000 mg | ORAL_TABLET | Freq: Every day | ORAL | 2 refills | Status: DC

## 2020-03-27 MED ORDER — TRAZODONE HCL 50 MG PO TABS: 25.0000 mg | ORAL_TABLET | Freq: Every evening | ORAL | 1 refills | Status: DC | PRN

## 2020-03-27 NOTE — Patient Instructions (Signed)
Stop lisinopril.  It may be causing the cough.  We have change it to a medication called Losartan instead.   Insomnia Insomnia is a sleep disorder that makes it difficult to fall asleep or stay asleep. Insomnia can cause fatigue, low energy, difficulty concentrating, mood swings, and poor performance at work or school. There are three different ways to classify insomnia:  Difficulty falling asleep.  Difficulty staying asleep.  Waking up too early in the morning. Any type of insomnia can be long-term (chronic) or short-term (acute). Both are common. Short-term insomnia usually lasts for three months or less. Chronic insomnia occurs at least three times a week for longer than three months. What are the causes? Insomnia may be caused by another condition, situation, or substance, such as:  Anxiety.  Certain medicines.  Gastroesophageal reflux disease (GERD) or other gastrointestinal conditions.  Asthma or other breathing conditions.  Restless legs syndrome, sleep apnea, or other sleep disorders.  Chronic pain.  Menopause.  Stroke.  Abuse of alcohol, tobacco, or illegal drugs.  Mental health conditions, such as depression.  Caffeine.  Neurological disorders, such as Alzheimer's disease.  An overactive thyroid (hyperthyroidism). Sometimes, the cause of insomnia may not be known. What increases the risk? Risk factors for insomnia include:  Gender. Women are affected more often than men.  Age. Insomnia is more common as you get older.  Stress.  Lack of exercise.  Irregular work schedule or working night shifts.  Traveling between different time zones.  Certain medical and mental health conditions. What are the signs or symptoms? If you have insomnia, the main symptom is having trouble falling asleep or having trouble staying asleep. This may lead to other symptoms, such as:  Feeling fatigued or having low energy.  Feeling nervous about going to sleep.  Not  feeling rested in the morning.  Having trouble concentrating.  Feeling irritable, anxious, or depressed. How is this diagnosed? This condition may be diagnosed based on:  Your symptoms and medical history. Your health care provider may ask about: ? Your sleep habits. ? Any medical conditions you have. ? Your mental health.  A physical exam. How is this treated? Treatment for insomnia depends on the cause. Treatment may focus on treating an underlying condition that is causing insomnia. Treatment may also include:  Medicines to help you sleep.  Counseling or therapy.  Lifestyle adjustments to help you sleep better. Follow these instructions at home: Eating and drinking   Limit or avoid alcohol, caffeinated beverages, and cigarettes, especially close to bedtime. These can disrupt your sleep.  Do not eat a large meal or eat spicy foods right before bedtime. This can lead to digestive discomfort that can make it hard for you to sleep. Sleep habits   Keep a sleep diary to help you and your health care provider figure out what could be causing your insomnia. Write down: ? When you sleep. ? When you wake up during the night. ? How well you sleep. ? How rested you feel the next day. ? Any side effects of medicines you are taking. ? What you eat and drink.  Make your bedroom a dark, comfortable place where it is easy to fall asleep. ? Put up shades or blackout curtains to block light from outside. ? Use a white noise machine to block noise. ? Keep the temperature cool.  Limit screen use before bedtime. This includes: ? Watching TV. ? Using your smartphone, tablet, or computer.  Stick to a routine that includes  going to bed and waking up at the same times every day and night. This can help you fall asleep faster. Consider making a quiet activity, such as reading, part of your nighttime routine.  Try to avoid taking naps during the day so that you sleep better at night.  Get  out of bed if you are still awake after 15 minutes of trying to sleep. Keep the lights down, but try reading or doing a quiet activity. When you feel sleepy, go back to bed. General instructions  Take over-the-counter and prescription medicines only as told by your health care provider.  Exercise regularly, as told by your health care provider. Avoid exercise starting several hours before bedtime.  Use relaxation techniques to manage stress. Ask your health care provider to suggest some techniques that may work well for you. These may include: ? Breathing exercises. ? Routines to release muscle tension. ? Visualizing peaceful scenes.  Make sure that you drive carefully. Avoid driving if you feel very sleepy.  Keep all follow-up visits as told by your health care provider. This is important. Contact a health care provider if:  You are tired throughout the day.  You have trouble in your daily routine due to sleepiness.  You continue to have sleep problems, or your sleep problems get worse. Get help right away if:  You have serious thoughts about hurting yourself or someone else. If you ever feel like you may hurt yourself or others, or have thoughts about taking your own life, get help right away. You can go to your nearest emergency department or call:  Your local emergency services (911 in the [place]  A suicide crisis helpline, such as the [place] at 843 426 1287. This is open 24 hours a day. Summary  Insomnia is a sleep disorder that makes it difficult to fall asleep or stay asleep.  Insomnia can be long-term (chronic) or short-term (acute).  Treatment for insomnia depends on the cause. Treatment may focus on treating an underlying condition that is causing insomnia.  Keep a sleep diary to help you and your health care provider figure out what could be causing your insomnia. This information is not intended to replace advice given to you by your  health care provider. Make sure you discuss any questions you have with your health care provider. Document Revised: 03/13/2017 Document Reviewed: 01/08/2017 Elsevier Patient Education  2020 [employer].

## 2020-03-27 NOTE — Progress Notes (Signed)
Patient ID: Shirley White, female    DOB: 1970/12/19  MRN: [id number]  CC: Blood Pressure Check and Gynecologic Exam   Subjective: Shirley White is a 48 y.o. female who presents for PAP and HTN.  Last seen 12/2019 Her concerns today include:  Patient with history of HTN, IDA, colon/appendix cancer s/p RT colectomy 07/2019, HIV, sickle cell trait, right breast biopsy 04/2018 with pathology showing fibrocystic changes and intraductal papilloma.  Right breast lumpectomy done 06/23/2018 with pathology showing intraductal papilloma, radial scar, adenosis, usual to moderate duct epithelial hyperplasia.  Fibrocystic changes  Patient is G5 P4 Had abnormal Pap 03/2018 showing LGSIL.  She saw Dr. 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  Plan was for her to get repeat Pap/ECC plus/minus biopsies by Dr. Denman George in 6 months.  However it looks like she did not get to see Dr. Denman George. -Menses are regular and heavy lasting about 5 days.  She has known fibroids.  Reports significant cramps with menses.  LNMP was 03/05/2020. -No vaginal discharge or itching at this time.  She declines screening for GC/chlamydia/trichomonas. No family history of ovarian/uterine or breast cancer. Referred for mammogram on last visit with me.  States she was called but because she did not have insurance they did not schedule her.  HTN: Reports compliance with Norvasc and lisinopril.  She limits salt in the foods.  Thinks she may have bronchitis because she has a dry cough.  Occasional wheezing.  She is wondering whether she needs an inhaler.  Complains of insomnia for the past 1 to 2 months.  Main issue is with staying asleep.  She gets in bed around 9 PM.  She watches TV for a little while and eventually drifts off to sleep.  Her significant other keeps the TV on until about 11:30 PM.  Patient states  she wakes up around 12 midnight and cannot go back to sleep for about 2 to 3 hours.  She usually has to wake up about 5:15 AM.  She endorses drinking 3 to 4 cans of [facility] daily with the last can being around 6 PM. Patient Active Problem List   Diagnosis Date Noted  . Heart murmur 12/27/2019  . Neuropathy 08/19/2019  . Hypertension 08/19/2019  . 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 Adenocarcinoma, colon ([place]) 07/20/2019  . HIV (human immunodeficiency virus infection) ([place]) 07/14/2018  . 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 Thrombocytopenia ([place]) 12/12/2017  . Routine health maintenance 08/28/2016  . H/O menorrhagia 09/05/2014  . Iron deficiency anemia 09/05/2014  . Sickle-cell trait ([place]) 06/04/2006     Current Outpatient Medications on File Prior to Visit  Medication Sig Dispense Refill  . amLODipine (NORVASC) 10 MG tablet Take 1 tablet (10 mg total) by mouth daily. 30 tablet 5  . bictegravir-emtricitabine-tenofovir AF (BIKTARVY) 50-200-25 MG TABS tablet Once daily 30 tablet 5  . capecitabine (XELODA) 500 MG tablet Take 3 tablets by mouth in AM and 2 tablets in PM. Take with food. Take for 14 days, then hold for 7 days. Repeat every 21 days. Do not start until you see MD 60 tablet 0  . ferrous sulfate 220 (44 Fe) MG/5ML solution Take 5 mLs (220 mg total) by mouth 3 (three) times daily with meals. Take at least 2 hours after Biktarvy 450 mL 2  .  gabapentin (NEURONTIN) 300 MG capsule TAKE 1 CAPSULE(300 MG) BY MOUTH THREE TIMES DAILY 90 capsule 3  . prochlorperazine (COMPAZINE) 10 MG tablet Take 1 tablet (10 mg total) by mouth every 6 (six) hours as needed for nausea. (Patient not taking: Reported on 02/06/2020) 30 tablet 1   No current facility-administered medications on file prior to visit.    No Known Allergies  Social History   Socioeconomic History  . Marital status: Single    Spouse name: Not on file  . Number of children: 4  . Years of education:  Not on file  . Highest education level: 12th grade  Occupational History  . Occupation: Scientist, water quality at [employer]  Tobacco Use  . Smoking status: Never Smoker  . Smokeless tobacco: Never Used  Vaping Use  . Vaping Use: Never used  Substance and Sexual Activity  . Alcohol use: Yes    Alcohol/week: 1.0 standard drink    Types: 1 Cans of beer per week    Comment: occa  . Drug use: Never  . Sexual activity: Not Currently    Partners: Male    Birth control/protection: Condom  Other Topics Concern  . Not on file  Social History Narrative   Patient lives alone   Patient has to take public transportation   Social Determinants of Health   Financial Resource Strain: Not on file  Food Insecurity: Not on file  Transportation Needs: Not on file  Physical Activity: Not on file  Stress: Not on file  Social Connections: Not on file  Intimate Partner Violence: Not on file    Family History  Problem Relation Age of Onset  . Hyperlipidemia Mother   . Hypertension Mother   . Throat cancer Father   . Colon cancer Maternal Aunt   . Leukemia Neg Hx   . Lymphoma Neg Hx   . Esophageal cancer Neg Hx   . Stomach cancer Neg Hx   . Rectal cancer Neg Hx     Past Surgical History:  Procedure Laterality Date  . CESAREAN SECTION    . ESOPHAGOGASTRODUODENOSCOPY N/A 02/03/2016   Procedure: ESOPHAGOGASTRODUODENOSCOPY (EGD);  Surgeon: Manus Gunning, MD;  Location: [place];  Service: Gastroenterology;  Laterality: N/A;  . LAPAROSCOPIC APPENDECTOMY N/A 07/14/2019   Procedure: LAPAROSCOPIC APPENDECTOMY CONVERTED TO OPEN RIGHT HEMICOLECTOMY;  Surgeon: Coralie Keens, MD;  Location: [place];  Service: General;  Laterality: N/A;  . RADIOACTIVE SEED GUIDED EXCISIONAL BREAST BIOPSY Right 06/23/2018   Procedure: RADIOACTIVE SEED GUIDED EXCISIONAL RIGHT BREAST BIOPSY;  Surgeon: Stark Klein, MD;  Location: [place];  Service: General;  Laterality: Right;    ROS: Review of  Systems Negative except as stated above  PHYSICAL EXAM: BP 128/70   Pulse 82   Resp 16   Wt 106 lb 9.6 oz (48.4 kg)   SpO2 95%   BMI 20.14 kg/m   Physical Exam  General appearance - alert, well appearing, and in no distress Mental status - normal mood, behavior, speech, dress, motor activity, and thought processes Neck - supple, no significant adenopathy Chest - clear to auscultation, no wheezes, rales or rhonchi, symmetric air entry Heart -regular rate and rhythm with systolic murmur that is unchanged Breasts -CMA [place] present for both breast and pelvic exam.  Breasts appear normal, no suspicious masses, no skin or nipple changes or axillary nodes Pelvic - normal external genitalia, vulva, vagina, cervix.  Uterus feels top normal size.  No adnexal masses appreciated.,  CMP Latest Ref Rng &  Units 02/28/2020 02/06/2020 11/28/2019  Glucose 70 - 99 mg/dL 87 90 83  BUN 6 - 20 mg/dL 17 10 11   Creatinine 0.44 - 1.00 mg/dL 0.76 0.70 0.68  Sodium 135 - 145 mmol/L 138 138 136  Potassium 3.5 - 5.1 mmol/L 4.1 3.7 3.9  Chloride 98 - 111 mmol/L 108 108 107  CO2 22 - 32 mmol/L 22 25 22   Calcium 8.9 - 10.3 mg/dL 8.9 8.9 9.4  Total Protein 6.5 - 8.1 g/dL 8.3(H) 7.6 8.0  Total Bilirubin 0.3 - 1.2 mg/dL 0.7 1.1 1.0  Alkaline Phos 38 - 126 U/L 52 52 59  AST 15 - 41 U/L 11(L) 9(L) 13(L)  ALT 0 - 44 U/L 8 9 10    Lipid Panel     Component Value Date/Time   CHOL 193 03/01/2020 0000   TRIG 81 03/01/2020 0000   HDL 66 03/01/2020 0000   CHOLHDL 2.9 03/01/2020 0000   VLDL 12 08/21/2015 1132   LDLCALC 110 (H) 03/01/2020 0000    CBC    Component Value Date/Time   WBC 6.7 02/28/2020 1524   WBC 8.4 07/19/2019 0447   RBC 4.60 02/28/2020 1524   HGB 11.2 (L) 02/28/2020 1524   HGB 10.7 (L) 08/28/2016 1423   HCT 34.7 (L) 02/28/2020 1524   HCT 33.2 (L) 08/28/2016 1423   PLT 367 02/28/2020 1524   PLT 294 08/28/2016 1423   MCV 75.4 (L) 02/28/2020 1524   MCV 76 (L) 08/28/2016 1423   MCH 24.3 (L)  02/28/2020 1524   MCHC 32.3 02/28/2020 1524   RDW 26.6 (H) 02/28/2020 1524   RDW 16.4 (H) 08/28/2016 1423   LYMPHSABS 1.8 02/28/2020 1524   MONOABS 0.7 02/28/2020 1524   EOSABS 0.2 02/28/2020 1524   BASOSABS 0.1 02/28/2020 1524    ASSESSMENT AND PLAN:  1. Pap smear for cervical cancer screening - Cytology - PAP  2. Essential hypertension At goal.  However we will discontinue lisinopril as I think it may be causing the cough.  We will put her on low-dose of Cozaar instead. - losartan (COZAAR) 25 MG tablet; Take 0.5 tablets (12.5 mg total) by mouth daily.  Dispense: 30 tablet; Refill: 2  3. Cough due to ACE inhibitor See #2 above  4. Insomnia, unspecified type Good sleep hygiene discussed and encouraged.  Advised patient to turn off all lights and sounds once she gets in bed.  She may need to ask her significant other to go watch TV in another room.  I also encouraged her to cut back on the amount of [facility] that she drinks every day because it is loaded with caffeine.  We will try her with a low-dose of trazodone - traZODone (DESYREL) 50 MG tablet; Take 0.5-1 tablets (25-50 mg total) by mouth at bedtime as needed for sleep.  Dispense: 30 tablet; Refill: 1  5. Encounter for screening mammogram for malignant neoplasm of breast My CMA will assist her in trying to get this through the [facility] program - MM Digital Screening; Future    Patient was given the opportunity to ask questions.  Patient verbalized understanding of the plan and was able to repeat key elements of the plan.   Orders Placed This Encounter  Procedures  . MM Digital Screening     Requested Prescriptions   Signed Prescriptions Disp Refills  . losartan (COZAAR) 25 MG tablet 30 tablet 2    Sig: Take 0.5 tablets (12.5 mg total) by mouth daily.  . traZODone (DESYREL) 50  MG tablet 30 tablet 1    Sig: Take 0.5-1 tablets (25-50 mg total) by mouth at bedtime as needed for sleep.    Return in about 4 months  (around 07/26/2020).  Karle Plumber, MD, FACP

## 2020-03-30 LAB — CYTOLOGY - PAP
Comment: NEGATIVE
High risk HPV: NEGATIVE

## 2020-03-31 ENCOUNTER — Other Ambulatory Visit: Payer: Self-pay | Admitting: Internal Medicine

## 2020-03-31 DIAGNOSIS — R87612 Low grade squamous intraepithelial lesion on cytologic smear of cervix (LGSIL): Secondary | ICD-10-CM

## 2020-03-31 DIAGNOSIS — Z21 Asymptomatic human immunodeficiency virus [HIV] infection status: Secondary | ICD-10-CM

## 2020-03-31 NOTE — Progress Notes (Signed)
Let patient know that her Pap smear came back abnormal again.  We will need to refer her back to the gynecologist for further management and evaluation.  Referral submitted.

## 2020-04-12 ENCOUNTER — Encounter: Payer: Self-pay | Admitting: Internal Medicine

## 2020-04-13 ENCOUNTER — Other Ambulatory Visit: Payer: Self-pay | Admitting: Family Medicine

## 2020-04-13 DIAGNOSIS — R059 Cough, unspecified: Secondary | ICD-10-CM

## 2020-04-17 ENCOUNTER — Telehealth (INDEPENDENT_AMBULATORY_CARE_PROVIDER_SITE_OTHER): Payer: Self-pay | Admitting: Primary Care

## 2020-05-15 NOTE — Assessment & Plan Note (Signed)
Resuming PO liquid Fe supplement. Advised to space out 6 hours from [place] to avoid under-absorption.

## 2020-05-15 NOTE — Assessment & Plan Note (Signed)
Due for repeat - planning on repeat pap with PCP.

## 2020-05-15 NOTE — Assessment & Plan Note (Signed)
Doing well on Biktarvy, no changes warranted. Last CD4 > 300, repeat pertinent labs today for therapeutic monitoring.  Pap due, mammo due.  Vaccines updated.   RTC in 80m

## 2020-05-15 NOTE — Assessment & Plan Note (Signed)
Appreciate care of oncology team

## 2020-05-24 ENCOUNTER — Encounter: Payer: Self-pay | Admitting: Obstetrics & Gynecology

## 2020-06-06 ENCOUNTER — Encounter: Payer: Self-pay | Admitting: Infectious Diseases

## 2020-06-15 ENCOUNTER — Other Ambulatory Visit: Payer: Self-pay | Admitting: Infectious Diseases

## 2020-06-15 DIAGNOSIS — B20 Human immunodeficiency virus [HIV] disease: Secondary | ICD-10-CM

## 2020-06-28 ENCOUNTER — Encounter: Payer: Self-pay | Admitting: Infectious Diseases

## 2020-06-28 ENCOUNTER — Other Ambulatory Visit: Payer: Self-pay

## 2020-06-28 ENCOUNTER — Ambulatory Visit (INDEPENDENT_AMBULATORY_CARE_PROVIDER_SITE_OTHER): Payer: Self-pay | Admitting: Infectious Diseases

## 2020-06-28 DIAGNOSIS — I10 Essential (primary) hypertension: Secondary | ICD-10-CM

## 2020-06-28 DIAGNOSIS — R87612 Low grade squamous intraepithelial lesion on cytologic smear of cervix (LGSIL): Secondary | ICD-10-CM

## 2020-06-28 DIAGNOSIS — Z21 Asymptomatic human immunodeficiency virus [HIV] infection status: Secondary | ICD-10-CM

## 2020-06-28 NOTE — Progress Notes (Signed)
Name: Shirley White  DOB: 09-04-1970 MRN: [id number] PCP: Ladell Pier, MD     Brief Narrative:  Shirley White is a 50 y.o. woman with HIV disease originally diagnosed in 2005. Poor adherence with AIDS qualifying CD4s since 2016 with CD4 nadir 30.  HIV Risk: heterosexual.  History of OIs: esophageal candidiasis.   Previous Regimens: . Complera 2011 >> difficulty swallowing regimen . Emtriva + Edurant + Viread 2011 through 01-2014 . Odefsey 2016 . Tiviay + Truvada 2018  . Biktarvy 2019  Genotypes: . 09-7891 - sensitive . 11-1015 - sensitive  .   Subjective:   Chief Complaint  Patient presents with  . Follow-up    B20     HPI: Shirley White is here for routine follow-up.  She is doing well reports no concerns over her health today.  She is starting a new position at [employer] which is causing increased stress for her but she knows she can get it in time. She tells me that her boyfriend Darnelle Maffucci is now in a residential rehab program for drug use.  She is very happy for him and can already tell a great deal of difference with how he sounds.  He due to be be released at the end of the month.  Sleep and mood have been good.  No concerns on her part.  Eating and drinking well.  Hopes to get 115 pounds but happy she is at 106 today.  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  Continues to follow with hematology oncology and overall feels very well.  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  She was referred by her PCP to gynecology for colposcopy and further investigation.   Review of Systems  Constitutional: Negative for appetite change, chills, fatigue, fever and unexpected weight change.  HENT: Negative for mouth sores, sore throat and trouble swallowing.   Eyes: Negative for pain and visual disturbance.  Respiratory: Negative for cough and shortness of breath.   Cardiovascular: Negative for chest  pain.  Gastrointestinal: Negative for abdominal pain, diarrhea and nausea.  Genitourinary: Negative for dysuria, menstrual problem and pelvic pain.  Musculoskeletal: Negative for back pain and neck pain.  Skin: Negative for color change and rash.  Neurological: Negative for weakness, numbness and headaches.  Hematological: Negative for adenopathy.  Psychiatric/Behavioral: Negative for dysphoric mood. The patient is not nervous/anxious.      Past Medical History:  Diagnosis Date  . Anemia   . GERD (gastroesophageal reflux disease)    no meds  . Heart murmur   . HIV disease [facility])     Outpatient Medications Prior to Visit  Medication Sig Dispense Refill  . amLODipine (NORVASC) 10 MG tablet Take 1 tablet (10 mg total) by mouth daily. 30 tablet 5  . bictegravir-emtricitabine-tenofovir AF (BIKTARVY) 50-200-25 MG TABS tablet TAKE 1 TABLET BY MOUTH DAILY, TO TAKE AT THE SAME TIME EACH DAY WITH OR WITHOUT FOOD 30 tablet 5  . ferrous sulfate 220 (44 Fe) MG/5ML solution Take 5 mLs (220 mg total) by mouth 3 (three) times daily with meals. Take at least 2 hours after Biktarvy 450 mL 2  . traZODone (DESYREL) 50 MG tablet Take 0.5-1 tablets (25-50 mg total) by mouth at bedtime as needed for sleep. 30 tablet 1  . capecitabine (XELODA) 500 MG tablet Take 3 tablets by mouth in AM and 2 tablets in PM. Take with food. Take for  14 days, then hold for 7 days. Repeat every 21 days. Do not start until you see MD (Patient not taking: Reported on 06/28/2020) 60 tablet 0  . gabapentin (NEURONTIN) 300 MG capsule TAKE 1 CAPSULE(300 MG) BY MOUTH THREE TIMES DAILY (Patient not taking: Reported on 06/28/2020) 90 capsule 3  . losartan (COZAAR) 25 MG tablet Take 0.5 tablets (12.5 mg total) by mouth daily. (Patient not taking: Reported on 06/28/2020) 30 tablet 2  . prochlorperazine (COMPAZINE) 10 MG tablet Take 1 tablet (10 mg total) by mouth every 6 (six) hours as needed for nausea. (Patient not taking: No sig reported) 30  tablet 1   No facility-administered medications prior to visit.     No Known Allergies  Social History   Tobacco Use  . Smoking status: Never Smoker  . Smokeless tobacco: Never Used  Vaping Use  . Vaping Use: Never used  Substance Use Topics  . Alcohol use: Yes    Alcohol/week: 1.0 standard drink    Types: 1 Cans of beer per week    Comment: occa  . Drug use: Never    Social History   Substance and Sexual Activity  Sexual Activity Not Currently  . Partners: Male  . Birth control/protection: Condom   Comment: DECLINED CONDOMS 06/2020     Objective:   Vitals:   06/28/20 1606  BP: (!) 144/85  Pulse: 80  Temp: 98.4 F (36.9 C)  TempSrc: Oral  Weight: 105 lb (47.6 kg)   Body mass index is 19.84 kg/m.   Physical Exam HENT:     Mouth/Throat:     Mouth: No oral lesions.     Dentition: No dental abscesses.  Cardiovascular:     Rate and Rhythm: Normal rate and regular rhythm.     Heart sounds: Normal heart sounds.  Pulmonary:     Effort: Pulmonary effort is normal.     Breath sounds: Normal breath sounds.  Abdominal:     General: There is no distension.     Palpations: Abdomen is soft.     Tenderness: There is no abdominal tenderness.  Musculoskeletal:        General: No tenderness. Normal range of motion.  Lymphadenopathy:     Cervical: No cervical adenopathy.  Skin:    General: Skin is warm and dry.     Findings: No rash.  Neurological:     Mental Status: She is alert and oriented to person, place, and time.  Psychiatric:        Judgment: Judgment normal.      Lab Results Lab Results  Component Value Date   WBC 6.7 02/28/2020   HGB 11.2 (L) 02/28/2020   HCT 34.7 (L) 02/28/2020   MCV 75.4 (L) 02/28/2020   PLT 367 02/28/2020    Lab Results  Component Value Date   CREATININE 0.76 02/28/2020   BUN 17 02/28/2020   NA 138 02/28/2020   K 4.1 02/28/2020   CL 108 02/28/2020   CO2 22 02/28/2020    Lab Results  Component Value Date   ALT 8  02/28/2020   AST 11 (L) 02/28/2020   ALKPHOS 52 02/28/2020   BILITOT 0.7 02/28/2020    Lab Results  Component Value Date   CHOL 193 03/01/2020   HDL 66 03/01/2020   LDLCALC 110 (H) 03/01/2020   TRIG 81 03/01/2020   CHOLHDL 2.9 03/01/2020   HIV 1 RNA Quant  Date Value  03/01/2020 <20 Copies/mL  10/12/2019 <20 NOT DETECTED copies/mL  06/23/2019 <20 NOT DETECTED copies/mL   CD4 T Cell Abs (/uL)  Date Value  03/01/2020 338 (L)  10/12/2019 328 (L)  06/23/2019 347 (L)     Assessment & Plan:   Problem List Items Addressed This Visit      High   HIV (human immunodeficiency virus infection) ([place]) (Chronic)    Mollye has been doing exceptionally well for 2.5 years now with undetectable viral loads and recovery of her CD4 count to normal range, even with treatments for cancer. We spent time reviewing her progress today and she is very happy. Will continue current plan for treatment with once daily Biktarvy. She can space out visits to every 6 months.  Pap smear was abnormal with LSIL, HPV (-). Agree with further evaluation under colposcopy.  Vaccinations up to date and no need to update today.   Return in about 6 months (around 12/29/2020).         Unprioritized   LGSIL on Pap smear of cervix    Plan for gyn referral (pending per PCP) to evaluate with colposcopy.       Hypertension    Situationally elevated today with stress with job - she assures me she takes her medications everyday.           Janene Madeira, MSN, NP-C [facility] for Infectious [place] Pager: [phone] Office: [phone]  06/29/20  8:43 AM

## 2020-06-28 NOTE — Patient Instructions (Addendum)
So nice to see you today!  Glad to hear you have good things going your way - you deserve it.   Please come back in 6 months to check in. You are due for your pap smear in December 2022.   Will do labs at the visit next time.

## 2020-06-29 NOTE — Assessment & Plan Note (Signed)
Shirley White has been doing exceptionally well for 2.5 years now with undetectable viral loads and recovery of her CD4 count to normal range, even with treatments for cancer. We spent time reviewing her progress today and she is very happy. Will continue current plan for treatment with once daily Biktarvy. She can space out visits to every 6 months.  Pap smear was abnormal with LSIL, HPV (-). Agree with further evaluation under colposcopy.  Vaccinations up to date and no need to update today.   Return in about 6 months (around 12/29/2020).

## 2020-06-29 NOTE — Assessment & Plan Note (Signed)
Situationally elevated today with stress with job - she assures me she takes her medications everyday.

## 2020-06-29 NOTE — Assessment & Plan Note (Signed)
Plan for gyn referral (pending per PCP) to evaluate with colposcopy.

## 2020-07-11 ENCOUNTER — Emergency Department (HOSPITAL_COMMUNITY)
Admission: EM | Admit: 2020-07-11 | Discharge: 2020-07-12 | Disposition: A | Discharge status: Home / Self Care | Payer: Self-pay | Attending: Emergency Medicine | Admitting: Emergency Medicine

## 2020-07-11 DIAGNOSIS — I10 Essential (primary) hypertension: Secondary | ICD-10-CM | POA: Insufficient documentation

## 2020-07-11 DIAGNOSIS — Z79899 Other long term (current) drug therapy: Secondary | ICD-10-CM | POA: Insufficient documentation

## 2020-07-11 DIAGNOSIS — Z21 Asymptomatic human immunodeficiency virus [HIV] infection status: Secondary | ICD-10-CM | POA: Insufficient documentation

## 2020-07-11 DIAGNOSIS — R0602 Shortness of breath: Secondary | ICD-10-CM | POA: Insufficient documentation

## 2020-07-11 DIAGNOSIS — F1490 Cocaine use, unspecified, uncomplicated: Secondary | ICD-10-CM | POA: Insufficient documentation

## 2020-07-11 DIAGNOSIS — F1410 Cocaine abuse, uncomplicated: Secondary | ICD-10-CM

## 2020-07-11 DIAGNOSIS — Z85038 Personal history of other malignant neoplasm of large intestine: Secondary | ICD-10-CM | POA: Insufficient documentation

## 2020-07-11 DIAGNOSIS — R778 Other specified abnormalities of plasma proteins: Secondary | ICD-10-CM

## 2020-07-11 DIAGNOSIS — R000 Tachycardia, unspecified: Secondary | ICD-10-CM | POA: Insufficient documentation

## 2020-07-11 LAB — CBC
HCT: 33.6 % — ABNORMAL LOW (ref 36.0–46.0)
Hemoglobin: 10.8 g/dL — ABNORMAL LOW (ref 12.0–15.0)
MCH: 24.8 pg — ABNORMAL LOW (ref 26.0–34.0)
MCHC: 32.1 g/dL (ref 30.0–36.0)
MCV: 77.2 fL — ABNORMAL LOW (ref 80.0–100.0)
Platelets: 504 10*3/uL — ABNORMAL HIGH (ref 150–400)
RBC: 4.35 MIL/uL (ref 3.87–5.11)
RDW: 15.9 % — ABNORMAL HIGH (ref 11.5–15.5)
WBC: 10 10*3/uL (ref 4.0–10.5)
nRBC: 0 % (ref 0.0–0.2)

## 2020-07-11 LAB — BASIC METABOLIC PANEL
Anion gap: 11 (ref 5–15)
BUN: 8 mg/dL (ref 6–20)
CO2: 20 mmol/L — ABNORMAL LOW (ref 22–32)
Calcium: 8.9 mg/dL (ref 8.9–10.3)
Chloride: 103 mmol/L (ref 98–111)
Creatinine, Ser: 1.07 mg/dL — ABNORMAL HIGH (ref 0.44–1.00)
GFR, Estimated: >60 mL/min (ref >60)
Glucose, Bld: 148 mg/dL — ABNORMAL HIGH (ref 70–99)
Potassium: 2.5 mmol/L — CL (ref 3.5–5.1)
Sodium: 134 mmol/L — ABNORMAL LOW (ref 135–145)

## 2020-07-11 LAB — TROPONIN I (HIGH SENSITIVITY)
Troponin I (High Sensitivity): 6 ng/L (ref <18)
Troponin I (High Sensitivity): 39 ng/L — ABNORMAL HIGH (ref <18)

## 2020-07-11 MED ORDER — POTASSIUM CHLORIDE CRYS ER 20 MEQ PO TBCR
40.0000 meq | EXTENDED_RELEASE_TABLET | Freq: Once | ORAL | Status: DC
  Filled 2020-07-11: qty 2

## 2020-07-11 MED ORDER — POTASSIUM CHLORIDE 10 MEQ/100ML IV SOLN
10.0000 meq | Freq: Once | INTRAVENOUS | Status: AC
  Administered 2020-07-12: 10 meq via INTRAVENOUS
  Filled 2020-07-11: qty 100

## 2020-07-11 MED ORDER — SODIUM CHLORIDE 0.9 % IV SOLN: Freq: Once | INTRAVENOUS | Status: AC

## 2020-07-11 MED ORDER — SODIUM CHLORIDE 0.9 % IV BOLUS: 1000.0000 mL | Freq: Once | INTRAVENOUS | Status: DC

## 2020-07-11 NOTE — ED Triage Notes (Signed)
Patient BIB [facility] for shortness of breath after insufflating cocaine at 1700 today. Patient anxious and shouting "oh god", "i'm going to die", and "my throat is closing up". HR 166 with EMS. Patient alert, oriented, and hyperventilating.

## 2020-07-11 NOTE — ED Provider Notes (Signed)
[facility] EMERGENCY DEPARTMENT Provider Note   CSN: [id number] Arrival date & time: 07/11/20  1905     History Chief Complaint  Patient presents with  . Shortness of Breath    Shirley White is a 50 y.o. female.  Patient is a 50 year old female with history of HIV disease, GERD, anemia.  Patient brought by EMS for evaluation of shortness of breath.  This apparently started after using cocaine this evening.  Per EMS, she was shouting that she was "going to die" and that her "throat was closing up".  Patient brought from the waiting room, then had some sort of unresponsive episode lasting less than 1 minute.  Patient denies to me she is experiencing any pain at present.  She does admit to cocaine use earlier this evening.  The history is provided by the patient.  Shortness of Breath Severity:  Moderate Onset quality:  Sudden Timing:  Constant Progression:  Partially resolved Chronicity:  New Context comment:  Cocaine use Relieved by:  Nothing Worsened by:  Nothing      Past Medical History:  Diagnosis Date  . Anemia   . GERD (gastroesophageal reflux disease)    no meds  . Heart murmur   . HIV disease [facility])     Patient Active Problem List   Diagnosis Date Noted  . Heart murmur 12/27/2019  . Neuropathy 08/19/2019  . Hypertension 08/19/2019  . 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 Adenocarcinoma, colon ([place]) 07/20/2019  . HIV (human immunodeficiency virus infection) ([place]) 07/14/2018  . 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 Thrombocytopenia ([place]) 12/12/2017  . Routine health maintenance 08/28/2016  . H/O menorrhagia 09/05/2014  . Iron deficiency anemia 09/05/2014  . Sickle-cell trait ([place]) 06/04/2006    Past Surgical History:  Procedure Laterality Date  . CESAREAN SECTION    . ESOPHAGOGASTRODUODENOSCOPY N/A 02/03/2016   Procedure: ESOPHAGOGASTRODUODENOSCOPY (EGD);  Surgeon: Manus Gunning, MD;  Location: [place];  Service: Gastroenterology;  Laterality: N/A;  . LAPAROSCOPIC APPENDECTOMY N/A 07/14/2019   Procedure: LAPAROSCOPIC APPENDECTOMY CONVERTED TO OPEN RIGHT HEMICOLECTOMY;  Surgeon: Coralie Keens, MD;  Location: [place];  Service: General;  Laterality: N/A;  . RADIOACTIVE SEED GUIDED EXCISIONAL BREAST BIOPSY Right 06/23/2018   Procedure: RADIOACTIVE SEED GUIDED EXCISIONAL RIGHT BREAST BIOPSY;  Surgeon: Stark Klein, MD;  Location: [place];  Service: General;  Laterality: Right;     OB History    Gravida  5   Para  4   Term  4   Preterm      AB  1   Living  4     SAB  0   IAB  1   Ectopic      Multiple      Live Births  4           Family History  Problem Relation Age of Onset  . Hyperlipidemia Mother   . Hypertension Mother   . Throat cancer Father   . Colon cancer Maternal Aunt   . Leukemia Neg Hx   . Lymphoma Neg Hx   . Esophageal cancer Neg Hx   . Stomach cancer Neg Hx   . Rectal cancer Neg Hx     Social History   Tobacco Use  . Smoking status: Never Smoker  . Smokeless tobacco: Never Used  Vaping Use  . Vaping Use: Never used  Substance Use Topics  . Alcohol use: Yes    Alcohol/week: 1.0  standard drink    Types: 1 Cans of beer per week    Comment: occa  . Drug use: Never    Home Medications Prior to Admission medications   Medication Sig Start Date End Date Taking? Authorizing Provider  amLODipine (NORVASC) 10 MG tablet Take 1 tablet (10 mg total) by mouth daily. 12/27/19   Ladell Pier, MD  bictegravir-emtricitabine-tenofovir AF (BIKTARVY) 50-200-25 MG TABS tablet TAKE 1 TABLET BY MOUTH DAILY, TO TAKE AT THE SAME TIME EACH DAY WITH OR WITHOUT FOOD 06/15/20   Sunbright Callas, NP  capecitabine (XELODA) 500 MG tablet Take 3 tablets by mouth in AM and 2 tablets in PM. Take with food. Take for 14 days, then hold for 7 days. Repeat every 21 days. Do not start until you see MD Patient not taking: Reported on 06/28/2020  02/08/20   Ladell Pier, MD  ferrous sulfate 220 (44 Fe) MG/5ML solution Take 5 mLs (220 mg total) by mouth 3 (three) times daily with meals. Take at least 2 hours after [facility] 02/07/20   Ladell Pier, MD  gabapentin (NEURONTIN) 300 MG capsule TAKE 1 CAPSULE(300 MG) BY MOUTH THREE TIMES DAILY Patient not taking: Reported on 06/28/2020 11/30/19   Peavine Callas, NP  losartan (COZAAR) 25 MG tablet Take 0.5 tablets (12.5 mg total) by mouth daily. Patient not taking: Reported on 06/28/2020 03/27/20   Ladell Pier, MD  prochlorperazine (COMPAZINE) 10 MG tablet Take 1 tablet (10 mg total) by mouth every 6 (six) hours as needed for nausea. Patient not taking: No sig reported 08/09/19   Ladell Pier, MD  traZODone (DESYREL) 50 MG tablet Take 0.5-1 tablets (25-50 mg total) by mouth at bedtime as needed for sleep. 03/27/20   Ladell Pier, MD    Allergies    Patient has no known allergies.  Review of Systems   Review of Systems  Respiratory: Positive for shortness of breath.   All other systems reviewed and are negative.   Physical Exam Updated Vital Signs BP (!) 172/96   Pulse (!) 121   Temp 98 F (36.7 C)   Resp (!) 26   SpO2 100%   Physical Exam Vitals and nursing note reviewed.  Constitutional:      General: She is not in acute distress.    Appearance: She is well-developed. She is not diaphoretic.  HENT:     Head: Normocephalic and atraumatic.  Cardiovascular:     Rate and Rhythm: Normal rate and regular rhythm.     Heart sounds: No murmur heard. No friction rub. No gallop.   Pulmonary:     Effort: Pulmonary effort is normal. No respiratory distress.     Breath sounds: Normal breath sounds. No wheezing.  Abdominal:     General: Bowel sounds are normal. There is no distension.     Palpations: Abdomen is soft.     Tenderness: There is no abdominal tenderness.  Musculoskeletal:        General: Normal range of motion.     Cervical back: Normal range of  motion and neck supple.     Right lower leg: No tenderness. No edema.     Left lower leg: No tenderness. No edema.  Skin:    General: Skin is warm and dry.  Neurological:     Mental Status: She is alert and oriented to person, place, and time.     ED Results / Procedures / Treatments   Labs (all labs ordered  are listed, but only abnormal results are displayed) Labs Reviewed  BASIC METABOLIC PANEL - Abnormal; Notable for the following components:      Result Value   Sodium 134 (*)    Potassium 2.5 (*)    CO2 20 (*)    Glucose, Bld 148 (*)    Creatinine, Ser 1.07 (*)    All other components within normal limits  CBC - Abnormal; Notable for the following components:   Hemoglobin 10.8 (*)    HCT 33.6 (*)    MCV 77.2 (*)    MCH 24.8 (*)    RDW 15.9 (*)    Platelets 504 (*)    All other components within normal limits  TROPONIN I (HIGH SENSITIVITY) - Abnormal; Notable for the following components:   Troponin I (High Sensitivity) 39 (*)    All other components within normal limits  BASIC METABOLIC PANEL  RAPID URINE DRUG SCREEN, HOSP PERFORMED  TROPONIN I (HIGH SENSITIVITY)  TROPONIN I (HIGH SENSITIVITY)    EKG None  Radiology No results found.  Procedures Procedures   Medications Ordered in ED Medications  sodium chloride 0.9 % bolus 1,000 mL (has no administration in time range)  potassium chloride 10 mEq in 100 mL IVPB (has no administration in time range)  potassium chloride SA (KLOR-CON) CR tablet 40 mEq (has no administration in time range)  0.9 %  sodium chloride infusion ( Intravenous New Bag/Given 07/11/20 2351)    ED Course  I have reviewed the triage vital signs and the nursing notes.  Pertinent labs & imaging results that were available during my care of the patient were reviewed by me and considered in my medical decision making (see chart for details).    MDM Rules/Calculators/A&P  Patient brought by EMS after some sort of manic/panic episode that  occurred after doing cocaine this evening.  Upon initial assessment, patient behaving somewhat erratically, however this improved as her ED course went on.  She initially arrived here very tachycardic and hypertensive, however this also resolved without intervention.  Work-up initiated including laboratory studies, EKG.  Initial troponin was normal, then was repeated on 3 occasions.  Her troponin did trend upward, however she has no EKG changes and has had no chest pain or difficulty breathing.  I feel as though the upward trend in troponin reflects demand from her temporary hypertensive crisis and tachycardia and also likely related to cocaine use.  She is having no chest pain and seems appropriate for discharge.  I did discuss the troponin with Dr. Rondel Baton from cardiology.  He agrees with my assessment.  Final Clinical Impression(s) / ED Diagnoses Final diagnoses:  None    Rx / DC Orders ED Discharge Orders    None       Veryl Speak, MD 07/12/20 0400

## 2020-07-11 NOTE — ED Notes (Signed)
Pt called for room x3. NA

## 2020-07-11 NOTE — ED Notes (Signed)
Pt initially brought back to room in recliner unresponsive.  She was moved to the bed and monitoring equipment was placed.  Pt "woke up" when provider walked in.  AO X4

## 2020-07-11 NOTE — ED Notes (Addendum)
Critical potassium 2.5 notified charge

## 2020-07-12 LAB — BASIC METABOLIC PANEL
Anion gap: 6 (ref 5–15)
BUN: 8 mg/dL (ref 6–20)
CO2: 22 mmol/L (ref 22–32)
Calcium: 8.4 mg/dL — ABNORMAL LOW (ref 8.9–10.3)
Chloride: 106 mmol/L (ref 98–111)
Creatinine, Ser: 0.67 mg/dL (ref 0.44–1.00)
GFR, Estimated: >60 mL/min (ref >60)
Glucose, Bld: 129 mg/dL — ABNORMAL HIGH (ref 70–99)
Potassium: 3.5 mmol/L (ref 3.5–5.1)
Sodium: 134 mmol/L — ABNORMAL LOW (ref 135–145)

## 2020-07-12 LAB — RAPID URINE DRUG SCREEN, HOSP PERFORMED
Amphetamines: NOT DETECTED
Barbiturates: NOT DETECTED
Benzodiazepines: NOT DETECTED
Cocaine: POSITIVE — AB
Opiates: NOT DETECTED
Tetrahydrocannabinol: POSITIVE — AB

## 2020-07-12 LAB — TROPONIN I (HIGH SENSITIVITY)
Troponin I (High Sensitivity): 58 ng/L — ABNORMAL HIGH (ref <18)
Troponin I (High Sensitivity): 100 ng/L (ref <18)

## 2020-07-12 NOTE — ED Notes (Signed)
Pt able to ambulate to the bathroom w/ standby assistance only.

## 2020-07-12 NOTE — Discharge Instructions (Addendum)
Return to the emergency department if you experience any new and/or concerning symptoms.  Refrain from illicit drug use in the future.

## 2020-07-12 NOTE — ED Notes (Signed)
Pt given a drink per provider

## 2020-08-02 ENCOUNTER — Encounter: Payer: Self-pay | Admitting: Internal Medicine

## 2020-08-02 DIAGNOSIS — G4700 Insomnia, unspecified: Secondary | ICD-10-CM

## 2020-08-02 DIAGNOSIS — I10 Essential (primary) hypertension: Secondary | ICD-10-CM

## 2020-08-03 MED ORDER — AMLODIPINE BESYLATE 10 MG PO TABS: 10.0000 mg | ORAL_TABLET | Freq: Every day | ORAL | 2 refills | Status: DC

## 2020-08-03 MED ORDER — TRAZODONE HCL 50 MG PO TABS: 25.0000 mg | ORAL_TABLET | Freq: Every evening | ORAL | 2 refills | Status: DC | PRN

## 2020-08-10 ENCOUNTER — Encounter (HOSPITAL_COMMUNITY): Payer: Self-pay | Admitting: Emergency Medicine

## 2020-08-10 ENCOUNTER — Emergency Department (HOSPITAL_COMMUNITY)
Admission: EM | Admit: 2020-08-10 | Discharge: 2020-08-11 | Disposition: A | Discharge status: Home / Self Care | Payer: Self-pay | Attending: Emergency Medicine | Admitting: Emergency Medicine

## 2020-08-10 DIAGNOSIS — Z21 Asymptomatic human immunodeficiency virus [HIV] infection status: Secondary | ICD-10-CM | POA: Insufficient documentation

## 2020-08-10 DIAGNOSIS — R000 Tachycardia, unspecified: Secondary | ICD-10-CM | POA: Insufficient documentation

## 2020-08-10 DIAGNOSIS — Z20822 Contact with and (suspected) exposure to covid-19: Secondary | ICD-10-CM | POA: Insufficient documentation

## 2020-08-10 DIAGNOSIS — F29 Unspecified psychosis not due to a substance or known physiological condition: Secondary | ICD-10-CM | POA: Insufficient documentation

## 2020-08-10 LAB — I-STAT BETA HCG BLOOD, ED (MC, WL, AP ONLY): I-stat hCG, quantitative: <5 m[IU]/mL (ref <5)

## 2020-08-10 LAB — CBC WITH DIFFERENTIAL/PLATELET
Abs Immature Granulocytes: 0.02 10*3/uL (ref 0.00–0.07)
Basophils Absolute: 0.1 10*3/uL (ref 0.0–0.1)
Basophils Relative: 1 %
Eosinophils Absolute: 0 10*3/uL (ref 0.0–0.5)
Eosinophils Relative: 0 %
HCT: 30.3 % — ABNORMAL LOW (ref 36.0–46.0)
Hemoglobin: 9.5 g/dL — ABNORMAL LOW (ref 12.0–15.0)
Immature Granulocytes: 0 %
Lymphocytes Relative: 7 %
Lymphs Abs: 0.6 10*3/uL — ABNORMAL LOW (ref 0.7–4.0)
MCH: 22.9 pg — ABNORMAL LOW (ref 26.0–34.0)
MCHC: 31.4 g/dL (ref 30.0–36.0)
MCV: 73 fL — ABNORMAL LOW (ref 80.0–100.0)
Monocytes Absolute: 0.3 10*3/uL (ref 0.1–1.0)
Monocytes Relative: 4 %
Neutro Abs: 7.3 10*3/uL (ref 1.7–7.7)
Neutrophils Relative %: 88 %
Platelets: 463 10*3/uL — ABNORMAL HIGH (ref 150–400)
RBC: 4.15 MIL/uL (ref 3.87–5.11)
RDW: 17.2 % — ABNORMAL HIGH (ref 11.5–15.5)
WBC: 8.3 10*3/uL (ref 4.0–10.5)
nRBC: 0 % (ref 0.0–0.2)

## 2020-08-10 LAB — ACETAMINOPHEN LEVEL: Acetaminophen (Tylenol), Serum: <10 ug/mL — ABNORMAL LOW (ref 10–30)

## 2020-08-10 LAB — COMPREHENSIVE METABOLIC PANEL
ALT: 14 U/L (ref 0–44)
AST: 22 U/L (ref 15–41)
Albumin: 3.8 g/dL (ref 3.5–5.0)
Alkaline Phosphatase: 50 U/L (ref 38–126)
Anion gap: 8 (ref 5–15)
BUN: 8 mg/dL (ref 6–20)
CO2: 25 mmol/L (ref 22–32)
Calcium: 9.1 mg/dL (ref 8.9–10.3)
Chloride: 107 mmol/L (ref 98–111)
Creatinine, Ser: 0.62 mg/dL (ref 0.44–1.00)
GFR, Estimated: >60 mL/min (ref >60)
Glucose, Bld: 101 mg/dL — ABNORMAL HIGH (ref 70–99)
Potassium: 3.8 mmol/L (ref 3.5–5.1)
Sodium: 140 mmol/L (ref 135–145)
Total Bilirubin: 0.4 mg/dL (ref 0.3–1.2)
Total Protein: 7.8 g/dL (ref 6.5–8.1)

## 2020-08-10 LAB — RESP PANEL BY RT-PCR (FLU A&B, COVID) ARPGX2
Influenza A by PCR: NEGATIVE
Influenza B by PCR: NEGATIVE
SARS Coronavirus 2 by RT PCR: NEGATIVE

## 2020-08-10 LAB — ETHANOL: Alcohol, Ethyl (B): <10 mg/dL (ref <10)

## 2020-08-10 LAB — SALICYLATE LEVEL: Salicylate Lvl: <7 mg/dL — ABNORMAL LOW (ref 7.0–30.0)

## 2020-08-10 MED ORDER — DIPHENHYDRAMINE HCL 25 MG PO CAPS
50.0000 mg | ORAL_CAPSULE | Freq: Once | ORAL | Status: AC
  Administered 2020-08-10: 50 mg via ORAL
  Filled 2020-08-10: qty 2

## 2020-08-10 MED ORDER — LORAZEPAM 2 MG/ML IJ SOLN
2.0000 mg | Freq: Once | INTRAMUSCULAR | Status: AC
  Administered 2020-08-10: 2 mg via INTRAMUSCULAR
  Filled 2020-08-10: qty 1

## 2020-08-10 MED ORDER — HALOPERIDOL LACTATE 5 MG/ML IJ SOLN
5.0000 mg | Freq: Once | INTRAMUSCULAR | Status: AC
  Administered 2020-08-10: 5 mg via INTRAMUSCULAR
  Filled 2020-08-10: qty 1

## 2020-08-10 MED ORDER — TRAZODONE HCL 50 MG PO TABS: 25.0000 mg | ORAL_TABLET | Freq: Every evening | ORAL | Status: DC | PRN

## 2020-08-10 MED ORDER — BICTEGRAVIR-EMTRICITAB-TENOFOV 50-200-25 MG PO TABS
1.0000 | ORAL_TABLET | Freq: Every day | ORAL | Status: DC
  Administered 2020-08-11: 1 via ORAL
  Filled 2020-08-10: qty 1

## 2020-08-10 MED ORDER — AMLODIPINE BESYLATE 5 MG PO TABS
10.0000 mg | ORAL_TABLET | Freq: Every day | ORAL | Status: DC
  Administered 2020-08-11: 10 mg via ORAL
  Filled 2020-08-10: qty 2

## 2020-08-10 NOTE — ED Triage Notes (Signed)
Per EMS-given an edible by someone-paranoid with family-wouldn't follow directions-this has happened before with THC use

## 2020-08-10 NOTE — ED Provider Notes (Signed)
[facility] DEPT Provider Note   CSN: [id number] Arrival date & time: 08/10/20  1504     History Chief Complaint  Patient presents with  . THC reaction    Shirley White is a 50 y.o. female.  HPI   We will defer HPI due to level 5 caveat altered mental status.   Patient with significant medical history of anemia, GERD, heart murmur, HIV, polysubstance abuse presents to the emergency department after she ate 2 edibles.  Patient was recently found wandering around the hallways, she ran past me and into another patient's room demanding that they bless her.  Hospital police came and escorted the patient back into her room.  Unable to obtain much information as patient was delirious upon my evaluation.  She states that she "wanted to bless me and be blessed by the [name]  She states that she was not in any pain at this time, she denies headaches, chest pain, abdominal pain, nausea, vomiting, diarrhea, worsening pedal edema.  Patient denies homicidal or suicidal ideations.  Past Medical History:  Diagnosis Date  . Anemia   . GERD (gastroesophageal reflux disease)    no meds  . Heart murmur   . HIV disease [facility])     Patient Active Problem List   Diagnosis Date Noted  . Heart murmur 12/27/2019  . Neuropathy 08/19/2019  . Hypertension 08/19/2019  . 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 Adenocarcinoma, colon ([place]) 07/20/2019  . HIV (human immunodeficiency virus infection) ([place]) 07/14/2018  . 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 Thrombocytopenia ([place]) 12/12/2017  . Routine health maintenance 08/28/2016  . H/O menorrhagia 09/05/2014  . Iron deficiency anemia 09/05/2014  . Sickle-cell trait ([place]) 06/04/2006    Past Surgical History:  Procedure Laterality Date  . CESAREAN SECTION    . ESOPHAGOGASTRODUODENOSCOPY N/A 02/03/2016   Procedure: ESOPHAGOGASTRODUODENOSCOPY (EGD);  Surgeon: Manus Gunning, MD;  Location: [place];  Service: Gastroenterology;  Laterality: N/A;  . LAPAROSCOPIC APPENDECTOMY N/A 07/14/2019   Procedure: LAPAROSCOPIC APPENDECTOMY CONVERTED TO OPEN RIGHT HEMICOLECTOMY;  Surgeon: Coralie Keens, MD;  Location: [place];  Service: General;  Laterality: N/A;  . RADIOACTIVE SEED GUIDED EXCISIONAL BREAST BIOPSY Right 06/23/2018   Procedure: RADIOACTIVE SEED GUIDED EXCISIONAL RIGHT BREAST BIOPSY;  Surgeon: Stark Klein, MD;  Location: [place];  Service: General;  Laterality: Right;     OB History    Gravida  5   Para  4   Term  4   Preterm      AB  1   Living  4     SAB  0   IAB  1   Ectopic      Multiple      Live Births  4           Family History  Problem Relation Age of Onset  . Hyperlipidemia Mother   . Hypertension Mother   . Throat cancer Father   . Colon cancer Maternal Aunt   . Leukemia Neg Hx   . Lymphoma Neg Hx   . Esophageal cancer Neg Hx   . Stomach cancer Neg Hx   . Rectal cancer Neg Hx     Social History   Tobacco Use  . Smoking status: Never Smoker  . Smokeless tobacco: Never Used  Vaping Use  . Vaping Use: Never used  Substance Use Topics  . Alcohol use: Yes    Alcohol/week: 1.0 standard drink  Types: 1 Cans of beer per week    Comment: occa  . Drug use: Yes    Types: Cocaine, Marijuana    Home Medications Prior to Admission medications   Medication Sig Start Date End Date Taking? Authorizing Provider  amLODipine (NORVASC) 10 MG tablet Take 1 tablet (10 mg total) by mouth daily. 08/03/20  Yes Ladell Pier, MD  bictegravir-emtricitabine-tenofovir AF (BIKTARVY) 50-200-25 MG TABS tablet TAKE 1 TABLET BY MOUTH DAILY, TO TAKE AT THE SAME TIME EACH DAY WITH OR WITHOUT FOOD Patient taking differently: Take 1 tablet by mouth daily. 06/15/20  Yes Nixon Callas, NP  capecitabine (XELODA) 500 MG tablet Take 3 tablets by mouth in AM and 2 tablets in PM. Take with food. Take for 14 days, then hold for 7 days.  Repeat every 21 days. Do not start until you see MD Patient taking differently: Take 500 mg by mouth daily. 02/08/20  Yes Ladell Pier, MD  traZODone (DESYREL) 50 MG tablet Take 0.5-1 tablets (25-50 mg total) by mouth at bedtime as needed for sleep. 08/03/20  Yes Ladell Pier, MD  ferrous sulfate 220 (44 Fe) MG/5ML solution Take 5 mLs (220 mg total) by mouth 3 (three) times daily with meals. Take at least 2 hours after [facility] Patient not taking: No sig reported 02/07/20   Ladell Pier, MD  gabapentin (NEURONTIN) 300 MG capsule TAKE 1 CAPSULE(300 MG) BY MOUTH THREE TIMES DAILY Patient not taking: No sig reported 11/30/19   [place] Callas, NP  losartan (COZAAR) 25 MG tablet Take 0.5 tablets (12.5 mg total) by mouth daily. Patient not taking: No sig reported 03/27/20   Ladell Pier, MD  prochlorperazine (COMPAZINE) 10 MG tablet Take 1 tablet (10 mg total) by mouth every 6 (six) hours as needed for nausea. Patient not taking: No sig reported 08/09/19   Ladell Pier, MD    Allergies    Patient has no known allergies.  Review of Systems   Review of Systems  Unable to perform ROS: Psychiatric disorder    Physical Exam Updated Vital Signs BP (!) 153/88   Pulse 95   Temp 98 F (36.7 C) (Oral)   Resp 18   SpO2 100%   Physical Exam Vitals and nursing note reviewed.  Constitutional:      General: She is not in acute distress.    Appearance: She is not ill-appearing.  HENT:     Head: Normocephalic and atraumatic.     Nose: No congestion.  Eyes:     Conjunctiva/sclera: Conjunctivae normal.  Cardiovascular:     Rate and Rhythm: Regular rhythm. Tachycardia present.     Pulses: Normal pulses.     Heart sounds: No murmur heard. No friction rub. No gallop.   Pulmonary:     Effort: No respiratory distress.     Breath sounds: No wheezing, rhonchi or rales.  Abdominal:     Palpations: Abdomen is soft.     Tenderness: There is no abdominal tenderness.   Musculoskeletal:     Right lower leg: No edema.     Left lower leg: No edema.  Skin:    General: Skin is warm and dry.     Comments: No noted track marks, laceration abrasions present.  Neurological:     Mental Status: She is alert.  Psychiatric:        Mood and Affect: Mood normal.     ED Results / Procedures / Treatments   Labs (all  labs ordered are listed, but only abnormal results are displayed) Labs Reviewed  COMPREHENSIVE METABOLIC PANEL - Abnormal; Notable for the following components:      Result Value   Glucose, Bld 101 (*)    All other components within normal limits  CBC WITH DIFFERENTIAL/PLATELET - Abnormal; Notable for the following components:   Hemoglobin 9.5 (*)    HCT 30.3 (*)    MCV 73.0 (*)    MCH 22.9 (*)    RDW 17.2 (*)    Platelets 463 (*)    Lymphs Abs 0.6 (*)    All other components within normal limits  SALICYLATE LEVEL - Abnormal; Notable for the following components:   Salicylate Lvl <7.0 (*)    All other components within normal limits  ACETAMINOPHEN LEVEL - Abnormal; Notable for the following components:   Acetaminophen (Tylenol), Serum <10 (*)    All other components within normal limits  RESP PANEL BY RT-PCR (FLU A&B, COVID) ARPGX2  ETHANOL  RAPID URINE DRUG SCREEN, HOSP PERFORMED  I-STAT BETA HCG BLOOD, ED ([facility], [facility], [facility] ONLY)    EKG None  Radiology No results found.  Procedures Procedures   Medications Ordered in ED Medications  amLODipine (NORVASC) tablet 10 mg (has no administration in time range)  bictegravir-emtricitabine-tenofovir AF (BIKTARVY) 50-200-25 MG per tablet 1 tablet (has no administration in time range)  traZODone (DESYREL) tablet 25-50 mg (has no administration in time range)  LORazepam (ATIVAN) injection 2 mg (2 mg Intramuscular Given 08/10/20 1602)  diphenhydrAMINE (BENADRYL) capsule 50 mg (50 mg Oral Given 08/10/20 1601)  haloperidol lactate (HALDOL) injection 5 mg (5 mg Intramuscular Given 08/10/20 1602)     ED Course  I have reviewed the triage vital signs and the nursing notes.  Pertinent labs & imaging results that were available during my care of the patient were reviewed by me and considered in my medical decision making (see chart for details).    MDM Rules/Calculators/A&P                          Initial impression-patient presents with hallucinations, she is alert, does not appear to be in acute stress, vital signs notable for tachycardia.  Concern for patient's safety and safety to others as she is noncompliant with hospital staff has been running around the hallway, will provide patient with Ativan, Haldol, Benadryl.   Will IVC patient as she is a danger to self and others, obtain med clearance work-up and further assess.  Work-up-CBC shows normocytic anemia appears to be baseline for patient, CMP shows hyperglycemia 101, ethanol less than 10, acetaminophen less than 10, salicylate less than 10, ECG less than 5, respiratory panel negative at this time.  EKG sinus tach without signs of ischemia no ST elevation or depression noted.  Reassessment patient was reassessed after providing her with Ativan, Haldol, Benadryl patient is resting comfortably, has no complaints this time.  Left signs remained stable.  Consult TTS pending at this time  Rule out-low suspicion for systemic infection as patient is nontoxic-appearing, vital signs reassuring, no obvious source infection on my exam.  Patient was noted to be tachycardic on arrival but this has since resolved after providing with medications, suspect this was secondary due to [facility] consumption.  Low suspicion for ACS as patient denies chest pain, shortness of breath, EKG is without signs of ischemia.  Low suspicion for intra-abdominal abnormality as abdomen soft nontender to palpation, patient tolerating p.o.  Plan- patient  is medically cleared at this time, patient will be placed in a psych hold, home meds will be ordered if any, will await  TTS recommendations for final disposition.  Patient is IVC at this time.    Final Clinical Impression(s) / ED Diagnoses Final diagnoses:  Psychosis, unspecified psychosis type [facility])    Rx / DC Orders ED Discharge Orders    None       Marcello Fennel, PA-C 08/10/20 2255    Varney Biles, MD 08/11/20 [phone]

## 2020-08-11 LAB — RAPID URINE DRUG SCREEN, HOSP PERFORMED
Amphetamines: NOT DETECTED
Barbiturates: NOT DETECTED
Benzodiazepines: NOT DETECTED
Cocaine: NOT DETECTED
Opiates: NOT DETECTED
Tetrahydrocannabinol: POSITIVE — AB

## 2020-08-11 NOTE — ED Notes (Signed)
Pt DC d off unit to home per provider. Pt alert, calm, cooperative, no s/s of distress.  DC information given to and reviewed with pt, pt acknowledged understanding. Belongings given to pt. Pt ambulatory orr unit, escorted by RN. Pt given bus pass for transportation

## 2020-08-11 NOTE — BH Assessment (Signed)
Comprehensive Clinical Assessment (CCA) Note  08/11/2020 Shirley White [id number]  Disposition: Shirley White recommends patient be discharged this date with OP resources to assist with ongoing SA issues. Information provided on discharge.   [place] ED from 08/10/2020 in [place] DEPT ED from 07/11/2020 in [place] No Risk Error: Question 6 not populated    The patient demonstrates the following risk factors for suicide: Chronic risk factors for suicide include: N/A. Acute risk factors for suicide include: N/A. Protective factors for this patient include: positive social support. Considering these factors, the overall suicide risk at this point appears to be low. Patient is appropriate for outpatient follow up.   Disposition: Shirley White recommends patient be discharged this date with OP resources to assist with ongoing SA issues. Information provided on discharge.   Patient is a 50 year old female that presents voluntary to [facility] after ingesting two "edibles" Shirley White) prior to arrival. Patient denies any S/I, H/I or AVH. Patient denies any current mental health symptoms or prior diagnosis. Patient states she is currently employed by [name] and was given two pieces of candy that contained THC. Patient states they were pre-packaged and she was aware that they contained THC. Patient states she "had no idea" she would "go crazy" if she ingested them. Patient's memory is recent impaired in reference to the events that transpired after the ingestion although thinks she "may have lost it." Patient reports a prior SA history to include sporadic cocaine use although denies the use of any substances other than mentioned above with UDS pending this date. Patient denies any prior attempts to self harm. Patient denies any inpatient hospital admissions associated with mental health. Per chart review patient was seen on  07/11/20 for SOB and tightness in her chest after using cocaine. Patient is observed to be back to her baseline and is remorseful in reference to the incident stating "I should have known better." Patient is denying any thoughts of self harm and is requesting to be discharged this date. Patient will be provided with SA resources on discharge.   Per notes on arrival [place] PA writes: Patient with significant medical history of anemia, GERD, heart murmur, HIV, polysubstance abuse presents to the emergency department after she ate 2 edibles.  Patient was recently found wandering around the hallways, she ran past me and into another patient's room demanding that they bless her.  Hospital police came and escorted the patient back into her room.  Unable to obtain much information as patient was delirious upon my evaluation.  She states that she "wanted to bless me and be blessed by the [name]  She states that she was not in any pain at this time, she denies headaches, chest pain, abdominal pain, nausea, vomiting, diarrhea, worsening pedal edema.  Patient denies homicidal or suicidal ideations.  Patient is oriented x 5 and presents with a pleasant affect. Patient denies any S/I, H/I or AVH. Patient's memory is intact with thoughts organized. Patient does not appear to be responding to internal stimuli.    Chief Complaint:  Chief Complaint  Patient presents with  . THC reaction   Visit Diagnosis: Substance induced psychosis     CCA Screening, Triage and Referral (STR)  Patient Reported Information How did you hear about [place]? Self  Referral name: No data recorded Referral phone number: No data recorded  Whom do you see for routine medical problems? I don't have a  doctor  Practice/Facility Name: No data recorded Practice/Facility Phone Number: No data recorded Name of Contact: No data recorded Contact Number: No data recorded Contact Fax Number: No data recorded Prescriber Name: No data  recorded Prescriber Address (if known): No data recorded  What Is the Reason for Your Visit/Call Today? Patient was actively impaired on arrival. Patient this date is observed to be back to her baseline stating she "ate two edibles" prior to arrival  How Long Has This Been Causing You Problems? <Week  What Do You Feel Would Help You the Most Today? -- (NA)   Have You Recently Been in Any Inpatient Treatment (Hospital/Detox/Crisis Center/28-Day Program)? No  Name/Location of Program/Hospital:No data recorded How Long Were You There? No data recorded When Were You Discharged? No data recorded  Have You Ever Received Services From [facility] Before? No  Who Do You See at [facility]? No data recorded  Have You Recently Had Any Thoughts About Hurting Yourself? No  Are You Planning to Commit Suicide/Harm Yourself At This time? No   Have you Recently Had Thoughts About [place]? No  Explanation: No data recorded  Have You Used Any Alcohol or Drugs in the Past 24 Hours? No  How Long Ago Did You Use Drugs or Alcohol? No data recorded What Did You Use and How Much? No data recorded  Do You Currently Have a Therapist/Psychiatrist? No  Name of Therapist/Psychiatrist: No data recorded  Have You Been Recently Discharged From Any Office Practice or Programs? No  Explanation of Discharge From Practice/Program: No data recorded    CCA Screening Triage Referral Assessment Type of Contact: Face-to-Face  Is this Initial or Reassessment? No data recorded Date Telepsych consult ordered in [facility]:  No data recorded Time Telepsych consult ordered in [facility]:  No data recorded  Patient Reported Information Reviewed? Yes  Patient Left Without Being Seen? No data recorded Reason for Not Completing Assessment: No data recorded  Collateral Involvement: None at this time   Does Patient Have a Court Appointed Legal Guardian? No data recorded Name and Contact of Legal Guardian: No  data recorded If Minor and Not Living with Parent(s), Who has Custody? No data recorded Is CPS involved or ever been involved? Never  Is APS involved or ever been involved? Never   Patient Determined To Be At Risk for Harm To Self or Others Based on Review of Patient Reported Information or Presenting Complaint? No  Method: No data recorded Availability of Means: No data recorded Intent: No data recorded Notification Required: No data recorded Additional Information for Danger to Others Potential: No data recorded Additional Comments for Danger to Others Potential: No data recorded Are There Guns or Other Weapons in Your Home? No data recorded Types of Guns/Weapons: No data recorded Are These Weapons Safely Secured?                            No data recorded Who Could Verify You Are Able To Have These Secured: No data recorded Do You Have any Outstanding Charges, Pending Court Dates, Parole/Probation? No data recorded Contacted To Inform of Risk of Harm To Self or Others: Other: Comment (NA)   Location of Assessment: [facility]   Does Patient Present under Involuntary Commitment? No  IVC Papers Initial File Date: No data recorded  [place] of Residence: [place]   Patient Currently Receiving the Following Services: Not Receiving Services   Determination of Need: Routine (  7 days)   Options For Referral: Outpatient Therapy     CCA Biopsychosocial Intake/Chief Complaint:  No data recorded Current Symptoms/Problems: No data recorded  Patient Reported Schizophrenia/Schizoaffective Diagnosis in Past: No data recorded  Strengths: No data recorded Preferences: No data recorded Abilities: No data recorded  Type of Services Patient Feels are Needed: No data recorded  Initial Clinical Notes/Concerns: No data recorded  Mental Health Symptoms Depression:  No data recorded  Duration of Depressive symptoms: No data recorded  Mania:  No data recorded  Anxiety:   No data  recorded  Psychosis:  No data recorded  Duration of Psychotic symptoms: No data recorded  Trauma:  No data recorded  Obsessions:  No data recorded  Compulsions:  No data recorded  Inattention:  No data recorded  Hyperactivity/Impulsivity:  No data recorded  Oppositional/Defiant Behaviors:  No data recorded  Emotional Irregularity:  No data recorded  Other Mood/Personality Symptoms:  No data recorded   Mental Status Exam Appearance and self-care  Stature:  No data recorded  Weight:  No data recorded  Clothing:  No data recorded  Grooming:  No data recorded  Cosmetic use:  No data recorded  Posture/gait:  No data recorded  Motor activity:  No data recorded  Sensorium  Attention:  No data recorded  Concentration:  No data recorded  Orientation:  No data recorded  Recall/memory:  No data recorded  Affect and Mood  Affect:  No data recorded  Mood:  No data recorded  Relating  Eye contact:  No data recorded  Facial expression:  No data recorded  Attitude toward examiner:  No data recorded  Thought and Language  Speech flow: No data recorded  Thought content:  No data recorded  Preoccupation:  No data recorded  Hallucinations:  No data recorded  Organization:  No data recorded  [employer] of Knowledge:  No data recorded  Intelligence:  No data recorded  Abstraction:  No data recorded  Judgement:  No data recorded  Reality Testing:  No data recorded  Insight:  No data recorded  Decision Making:  No data recorded  Social Functioning  Social Maturity:  No data recorded  Social Judgement:  No data recorded  Stress  Stressors:  No data recorded  Coping Ability:  No data recorded  Skill Deficits:  No data recorded  Supports:  No data recorded    Religion:    Leisure/Recreation:    Exercise/Diet:     CCA Employment/Education Employment/Work Situation:    Education:     CCA Family/Childhood History Family and Relationship History:     Childhood History:     Child/Adolescent Assessment:     CCA Substance Use Alcohol/Drug Use:                           ASAM's:  Six Dimensions of Multidimensional Assessment  Dimension 1:  Acute Intoxication and/or Withdrawal Potential:      Dimension 2:  Biomedical Conditions and Complications:      Dimension 3:  Emotional, Behavioral, or Cognitive Conditions and Complications:     Dimension 4:  Readiness to Change:     Dimension 5:  Relapse, Continued use, or Continued Problem Potential:     Dimension 6:  Recovery/Living Environment:     ASAM Severity Score:    ASAM Recommended Level of Treatment:     Substance use Disorder (SUD)    Recommendations for Services/Supports/Treatments:  DSM5 Diagnoses: Patient Active Problem List   Diagnosis Date Noted  . Heart murmur 12/27/2019  . Neuropathy 08/19/2019  . Hypertension 08/19/2019  . 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 Adenocarcinoma, colon ([place]) 07/20/2019  . HIV (human immunodeficiency virus infection) ([place]) 07/14/2018  . 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 Thrombocytopenia ([place]) 12/12/2017  . Routine health maintenance 08/28/2016  . H/O menorrhagia 09/05/2014  . Iron deficiency anemia 09/05/2014  . Sickle-cell trait ([place]) 06/04/2006    Patient Centered Plan: Patient is on the following Treatment Plan(s):    Referrals to Alternative Service(s): Referred to Alternative Service(s):   Place:   Date:   Time:    Referred to Alternative Service(s):   Place:   Date:   Time:    Referred to Alternative Service(s):   Place:   Date:   Time:    Referred to Alternative Service(s):   Place:   Date:   Time:     Mamie Nick, LCAS

## 2020-08-11 NOTE — ED Notes (Signed)
Patient too lethargic to engage in TTS assessment.

## 2020-08-11 NOTE — Discharge Instructions (Addendum)
Return for any problem.  ?

## 2020-08-11 NOTE — BH Assessment (Signed)
Irine Seal, RN attempted to rouse pt however pt was unable too drowsy to engage. RN to contact TTS once pt is alert and able to be assessed.    Vertell Novak, [place], [facility], [facility] Triage Specialist [phone]

## 2020-08-11 NOTE — ED Provider Notes (Signed)
Emergency Medicine Observation Re-evaluation Note  Shirley White is a 50 y.o. female, seen on rounds today.  Pt initially presented to the ED for complaints of THC reaction Currently, the patient is sleeping comfortably.  Physical Exam  BP (!) 141/82 (BP Location: Right Arm)   Pulse 95   Temp 98.5 F (36.9 C) (Oral)   Resp 20   SpO2 99%  Physical Exam General: NAD   ED Course / MDM  EKG:EKG Interpretation  Date/Time:  [place] August 10 2020 20:24:29 EDT Ventricular Rate:  110 PR Interval:  166 QRS Duration: 82 QT Interval:  350 QTC Calculation: 473 R Axis:   74 Text Interpretation: Sinus tachycardia Otherwise normal ECG Confirmed by Ripley Fraise [phone]) on 08/11/2020 1:05:33 AM   I have reviewed the labs performed to date as well as medications administered while in observation.  Recent changes in the last 24 hours include no acute events reported by staff.  Plan  Current plan is for [facility] evaluation. Patient is under full IVC at this time.   Valarie Merino, MD 08/11/20 [phone]

## 2020-10-10 ENCOUNTER — Other Ambulatory Visit: Payer: Self-pay | Admitting: Family

## 2020-10-10 DIAGNOSIS — Z21 Asymptomatic human immunodeficiency virus [HIV] infection status: Secondary | ICD-10-CM

## 2020-11-26 ENCOUNTER — Other Ambulatory Visit: Payer: Self-pay | Admitting: Infectious Diseases

## 2020-11-26 DIAGNOSIS — B20 Human immunodeficiency virus [HIV] disease: Secondary | ICD-10-CM

## 2020-12-06 ENCOUNTER — Encounter: Payer: Self-pay | Admitting: Infectious Diseases

## 2020-12-20 ENCOUNTER — Other Ambulatory Visit: Payer: Self-pay

## 2020-12-20 DIAGNOSIS — Z21 Asymptomatic human immunodeficiency virus [HIV] infection status: Secondary | ICD-10-CM

## 2020-12-20 DIAGNOSIS — Z113 Encounter for screening for infections with a predominantly sexual mode of transmission: Secondary | ICD-10-CM

## 2020-12-21 ENCOUNTER — Telehealth: Payer: Self-pay | Admitting: *Deleted

## 2020-12-21 LAB — T-HELPER CELL (CD4) - (RCID CLINIC ONLY)
CD4 % Helper T Cell: 26 % — ABNORMAL LOW (ref 33–65)
CD4 T Cell Abs: 462 /uL (ref 400–1790)

## 2020-12-21 NOTE — Telephone Encounter (Signed)
Received call from [place] regarding urgent lab results Hemoglobin 6.9 from 12/20/20. Landis Gandy, RN

## 2020-12-22 LAB — COMPLETE METABOLIC PANEL WITH GFR
AG Ratio: 1.4 (calc) (ref 1.0–2.5)
ALT: 7 U/L (ref 6–29)
AST: 9 U/L — ABNORMAL LOW (ref 10–35)
Albumin: 3.8 g/dL (ref 3.6–5.1)
Alkaline phosphatase (APISO): 45 U/L (ref 31–125)
BUN: 16 mg/dL (ref 7–25)
CO2: 24 mmol/L (ref 20–32)
Calcium: 8.6 mg/dL (ref 8.6–10.2)
Chloride: 105 mmol/L (ref 98–110)
Creat: 0.65 mg/dL (ref 0.50–0.99)
Globulin: 2.8 g/dL (calc) (ref 1.9–3.7)
Glucose, Bld: 85 mg/dL (ref 65–99)
Potassium: 4 mmol/L (ref 3.5–5.3)
Sodium: 137 mmol/L (ref 135–146)
Total Bilirubin: 0.4 mg/dL (ref 0.2–1.2)
Total Protein: 6.6 g/dL (ref 6.1–8.1)
eGFR: 109 mL/min/{1.73_m2} (ref >=60)

## 2020-12-22 LAB — CBC WITH DIFFERENTIAL/PLATELET
Absolute Monocytes: 798 cells/uL (ref 200–950)
Basophils Absolute: 103 cells/uL (ref 0–200)
Basophils Relative: 1.3 %
Eosinophils Absolute: 292 cells/uL (ref 15–500)
Eosinophils Relative: 3.7 %
HCT: 24.9 % — ABNORMAL LOW (ref 35.0–45.0)
Hemoglobin: 6.9 g/dL — ABNORMAL LOW (ref 11.7–15.5)
Lymphs Abs: 1809 cells/uL (ref 850–3900)
MCH: 18 pg — ABNORMAL LOW (ref 27.0–33.0)
MCHC: 27.7 g/dL — ABNORMAL LOW (ref 32.0–36.0)
MCV: 64.8 fL — ABNORMAL LOW (ref 80.0–100.0)
MPV: 10.4 fL (ref 7.5–12.5)
Monocytes Relative: 10.1 %
Neutro Abs: 4898 cells/uL (ref 1500–7800)
Neutrophils Relative %: 62 %
Platelets: 429 10*3/uL — ABNORMAL HIGH (ref 140–400)
RBC: 3.84 10*6/uL (ref 3.80–5.10)
RDW: 18.9 % — ABNORMAL HIGH (ref 11.0–15.0)
Total Lymphocyte: 22.9 %
WBC: 7.9 10*3/uL (ref 3.8–10.8)

## 2020-12-22 LAB — RPR: RPR Ser Ql: NONREACTIVE

## 2020-12-22 LAB — HIV-1 RNA QUANT-NO REFLEX-BLD
HIV 1 RNA Quant: NOT DETECTED Copies/mL
HIV-1 RNA Quant, Log: NOT DETECTED Log cps/mL

## 2020-12-24 ENCOUNTER — Telehealth: Payer: Self-pay | Admitting: *Deleted

## 2020-12-24 DIAGNOSIS — D500 Iron deficiency anemia secondary to blood loss (chronic): Secondary | ICD-10-CM

## 2020-12-24 NOTE — Telephone Encounter (Addendum)
Per Dr. Benay Spice: Needs to be seen and have IV iron again. Called patient and left VM requesting return call to discuss scheduling of appointment. Left VM again on 12/25/20 for return call to schedule OV/IV iron per Dr. Benay Spice.

## 2020-12-31 ENCOUNTER — Encounter: Payer: Self-pay | Admitting: Oncology

## 2020-12-31 NOTE — Telephone Encounter (Signed)
Third voice mail left for patient to have her seen and receive iron infusion due to low counts. Unable to reach her fiance, Darnelle Maffucci. Spoke w/her mother, Stanton Kidney Feld and requested she have [place] call office and she agrees.

## 2021-01-03 ENCOUNTER — Telehealth (INDEPENDENT_AMBULATORY_CARE_PROVIDER_SITE_OTHER): Payer: Self-pay | Admitting: Infectious Diseases

## 2021-01-03 ENCOUNTER — Other Ambulatory Visit: Payer: Self-pay

## 2021-01-03 ENCOUNTER — Telehealth: Payer: Self-pay | Admitting: *Deleted

## 2021-01-03 ENCOUNTER — Encounter: Payer: Self-pay | Admitting: Infectious Diseases

## 2021-01-03 DIAGNOSIS — T7491XA Unspecified adult maltreatment, confirmed, initial encounter: Secondary | ICD-10-CM

## 2021-01-03 DIAGNOSIS — Z21 Asymptomatic human immunodeficiency virus [HIV] infection status: Secondary | ICD-10-CM

## 2021-01-03 DIAGNOSIS — D219 Benign neoplasm of connective and other soft tissue, unspecified: Secondary | ICD-10-CM

## 2021-01-03 DIAGNOSIS — Z8742 Personal history of other diseases of the female genital tract: Secondary | ICD-10-CM

## 2021-01-03 DIAGNOSIS — Z0000 Encounter for general adult medical examination without abnormal findings: Secondary | ICD-10-CM

## 2021-01-03 DIAGNOSIS — D500 Iron deficiency anemia secondary to blood loss (chronic): Secondary | ICD-10-CM

## 2021-01-03 DIAGNOSIS — R87612 Low grade squamous intraepithelial lesion on cytologic smear of cervix (LGSIL): Secondary | ICD-10-CM

## 2021-01-03 NOTE — Assessment & Plan Note (Addendum)
Not currently experiencing any symptoms to her knowledge with recent Hgb 6.9.  I asked her to please schedule a follow up with Dr. Wynetta Emery to help with further management and recommendations. She was advised to continue with PO treatment and use stool softener / laxative if she experiences constipation.

## 2021-01-03 NOTE — Assessment & Plan Note (Signed)
Shirley White has improved significantly with adherence and has had a full recovery of her immune system over the last 2 years. Very proud of her work and commitment to treatment.  Discussed labs drawn recently. Will arrange fu in 62m with labs prior to her visit.

## 2021-01-03 NOTE — Assessment & Plan Note (Signed)
Recommended flu shot. COVID bivalent booster in late October given her recent recovery of infection.

## 2021-01-03 NOTE — Patient Instructions (Addendum)
Make an appointment with Dr. Wynetta Emery sometime in 1 month to recheck your hemoglobin and iron levels  Continue the iron 4 hours apart from your biktarvy so they can both absorb best  Continue your biktarvy everyday  Will help to arrange a referral to gynecology for you to help with bleeding management.

## 2021-01-03 NOTE — Assessment & Plan Note (Signed)
She verifies her safety at present. Working with her case Freight forwarder on more resources tomorrow. I asked her to please reach out if she needs further assistance.

## 2021-01-03 NOTE — Telephone Encounter (Signed)
Spoke w/patient regarding low blood counts and Dr. Benay Spice has offered to see her and receive IV iron infusion. She states she is feeling OK and is going to start taking ferrous sulfate 325 mg tid. Reminded her to be sure she keeps bowels moving with stool softener or daily MiraLax if she gets constipated.

## 2021-01-03 NOTE — Assessment & Plan Note (Signed)
Large uterine fibroid noted previously with very heavy menses complicated by severe iron deficiency anemia. I will help her with associated bleeding management in hopes that this will help avoid severe anemia again. Menstrual cycles are irregular.

## 2021-01-04 ENCOUNTER — Encounter: Payer: Self-pay | Admitting: Infectious Diseases
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
## 2021-02-17 ENCOUNTER — Other Ambulatory Visit: Payer: Self-pay | Admitting: Infectious Diseases

## 2021-02-17 DIAGNOSIS — B20 Human immunodeficiency virus [HIV] disease: Secondary | ICD-10-CM

## 2021-03-22 ENCOUNTER — Telehealth: Payer: Self-pay

## 2021-03-22 ENCOUNTER — Encounter: Payer: Self-pay | Admitting: Internal Medicine

## 2021-03-22 NOTE — Telephone Encounter (Signed)
Pt sent a MyChart message today 03/22/21 in regards to not feeling well. Reached out to pt to see what symptoms she is having per pt she is having chest pain, dizziness, fever and a cough. Made pt aware that since she hasn't seen Dr. Wynetta Emery since 03/27/20 Dr. Wynetta Emery would recommend an office visit. Made pt aware that because of her symptoms and because of the time frame we would highly recommend that she be seen in the ED or Urgent Care. Pt states she understands and she will go to either to be checked out. Made pt aware that she can contact us to schedule a follow up visit. Pt states she understands and doesn't have any questions or concerns

## 2021-03-23 NOTE — Telephone Encounter (Signed)
Note from [place] seen.  I agree with advice given.

## 2021-03-25 ENCOUNTER — Other Ambulatory Visit: Payer: Self-pay

## 2021-03-25 ENCOUNTER — Encounter (HOSPITAL_COMMUNITY): Payer: Self-pay

## 2021-03-25 ENCOUNTER — Ambulatory Visit (HOSPITAL_COMMUNITY)
Admission: EM | Admit: 2021-03-25 | Discharge: 2021-03-25 | Disposition: A | Discharge status: Home / Self Care | Payer: Self-pay | Attending: Emergency Medicine | Admitting: Emergency Medicine

## 2021-03-25 DIAGNOSIS — J069 Acute upper respiratory infection, unspecified: Secondary | ICD-10-CM | POA: Insufficient documentation

## 2021-03-25 DIAGNOSIS — R110 Nausea: Secondary | ICD-10-CM | POA: Insufficient documentation

## 2021-03-25 DIAGNOSIS — Z20822 Contact with and (suspected) exposure to covid-19: Secondary | ICD-10-CM | POA: Insufficient documentation

## 2021-03-25 DIAGNOSIS — R059 Cough, unspecified: Secondary | ICD-10-CM | POA: Insufficient documentation

## 2021-03-25 DIAGNOSIS — R509 Fever, unspecified: Secondary | ICD-10-CM | POA: Insufficient documentation

## 2021-03-25 LAB — POC INFLUENZA A AND B ANTIGEN (URGENT CARE ONLY)
INFLUENZA A ANTIGEN, POC: NEGATIVE
INFLUENZA B ANTIGEN, POC: NEGATIVE

## 2021-03-25 MED ORDER — BENZONATATE 100 MG PO CAPS: 100.0000 mg | ORAL_CAPSULE | Freq: Three times a day (TID) | ORAL | 0 refills | Status: DC

## 2021-03-25 NOTE — ED Triage Notes (Signed)
Pt presents with fever, cough, chills and c/o no taste x 6 days. Pt states this morning she woke up dizzy.

## 2021-03-25 NOTE — Discharge Instructions (Signed)
Negative flu test. COVID test is pending. Rest, keep hydrated. Take cough medication as prescribed and can continue with OTC medicine as needed. Follow-up with PCP if no improvement in a week. Go to the ER if develop difficulty breathing.

## 2021-03-25 NOTE — ED Notes (Signed)
Flu and covid swab in lab
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
## 2021-03-26 LAB — SARS CORONAVIRUS 2 (TAT 6-24 HRS): SARS Coronavirus 2: NEGATIVE

## 2021-06-24 ENCOUNTER — Encounter: Payer: Self-pay | Admitting: Internal Medicine

## 2021-07-04 ENCOUNTER — Other Ambulatory Visit: Payer: Self-pay

## 2021-07-15 ENCOUNTER — Ambulatory Visit (INDEPENDENT_AMBULATORY_CARE_PROVIDER_SITE_OTHER): Payer: Self-pay | Admitting: Infectious Diseases

## 2021-07-15 ENCOUNTER — Encounter: Payer: Self-pay | Admitting: Infectious Diseases

## 2021-07-15 ENCOUNTER — Other Ambulatory Visit: Payer: Self-pay

## 2021-07-15 VITALS — BP 119/78 | HR 82 | Resp 16 | Ht 61.0 in | Wt 98.4 lb

## 2021-07-15 DIAGNOSIS — R1319 Other dysphagia: Secondary | ICD-10-CM

## 2021-07-15 DIAGNOSIS — Z21 Asymptomatic human immunodeficiency virus [HIV] infection status: Secondary | ICD-10-CM

## 2021-07-15 MED ORDER — CABOTEGRAVIR & RILPIVIRINE ER 600 & 900 MG/3ML IM SUER: 1.0000 | Freq: Once | INTRAMUSCULAR | 0 refills | Status: AC

## 2021-07-15 NOTE — Patient Instructions (Addendum)
Will plan to switch to the Cabenuva injections.  ?We can schedule a visit next week after we get it approved.  ? ?I have you down for your first injection of Cabenuva next Tuesday  ? ?Allergy medications -  ?Pataday eye drops for allergies ? ?Flonase for the nose symptoms ? ?Any of the over the counter allergy medications are safe to take with your medications - Claritin is very small so you may tolerate it well.  ?

## 2021-07-15 NOTE — Progress Notes (Signed)
? ?Name: Shirley White  ?DOB: 1970-07-03 ?MRN: [id number] ?PCP: Ladell Pier, MD  ? ? ?Brief Narrative:  ?Shirley White is a 51 y.o. woman with HIV disease originally diagnosed in 2005. Poor adherence with AIDS qualifying CD4s since 2016 with CD4 nadir 30.  ?HIV Risk: heterosexual.  ?History of OIs: esophageal candidiasis.  ? ?Previous Regimens: ?Complera 2011 >> difficulty swallowing regimen ?Emtriva + Edurant + Viread 2011 through 01-2014 ?Odefsey 2016 ?Tiviay + Truvada 2018  ?Biktarvy 2019 ? ?Genotypes: ?08-2014 - sensitive ?11-2016 - sensitive  ? ? ?Subjective:  ? ?Chief Complaint  ?Patient presents with  ? Follow-up  ?  Difficult swallowing pills would like to discuss switching to shots.   ?  ? ?HPI: ?[name] is here for routine follow-up. LOV in September 2022 - VL < 20 and CD4 462 (26%). Doing well on biktarvy once daily.  ? ?Recovering from URI a few weeks back.  ?Met with financial team in February for next ADAP approval.  ? ?All of a sudden has had a hard time with swallowing her medication. Not painful. Has been off the biktarvy for a month now due to the trouble swallowing. She notices no pain or discomfort with eating or swallowing just a "hoarding" effect. Notices it with food as well. She has a history of requiring esophageal stretching in the past as well as esophageal candidiasis.  ? ? ?Review of Systems  ?Constitutional:  Negative for appetite change, chills, fatigue, fever and unexpected weight change.  ?HENT:  Negative for mouth sores, sore throat and trouble swallowing.   ?Eyes:  Negative for pain and visual disturbance.  ?Respiratory:  Negative for cough and shortness of breath.   ?Cardiovascular:  Negative for chest pain.  ?Gastrointestinal:  Negative for abdominal pain, diarrhea and nausea.  ?     Difficulty swallowing  ?Genitourinary:  Negative for dysuria, menstrual problem and pelvic pain.  ?Musculoskeletal:  Negative for back pain and neck pain.  ?Skin:  Negative for color  change and rash.  ?Neurological:  Negative for weakness, numbness and headaches.  ?Hematological:  Negative for adenopathy.  ?Psychiatric/Behavioral:  Negative for dysphoric mood. The patient is not nervous/anxious.   ? ? ?Past Medical History:  ?Diagnosis Date  ? Anemia   ? GERD (gastroesophageal reflux disease)   ? no meds  ? Heart murmur   ? HIV disease ([place])   ? ? ?Outpatient Medications Prior to Visit  ?Medication Sig Dispense Refill  ? amLODipine (NORVASC) 10 MG tablet Take 1 tablet (10 mg total) by mouth daily. 30 tablet 2  ? benzonatate (TESSALON) 100 MG capsule Take 1 capsule (100 mg total) by mouth every 8 (eight) hours. 21 capsule 0  ? bictegravir-emtricitabine-tenofovir AF (BIKTARVY) 50-200-25 MG TABS tablet TAKE 1 TABLET BY MOUTH AT THE SAME TIME EVERY DAY WITH OR WITHOUT FOOD 30 tablet 2  ? ferrous sulfate 220 (44 Fe) MG/5ML solution Take 5 mLs (220 mg total) by mouth 3 (three) times daily with meals. Take at least 2 hours after Biktarvy 450 mL 2  ? gabapentin (NEURONTIN) 300 MG capsule TAKE 1 CAPSULE(300 MG) BY MOUTH THREE TIMES DAILY 90 capsule 3  ? prochlorperazine (COMPAZINE) 10 MG tablet Take 1 tablet (10 mg total) by mouth every 6 (six) hours as needed for nausea. 30 tablet 1  ? traZODone (DESYREL) 50 MG tablet Take 0.5-1 tablets (25-50 mg total) by mouth at bedtime as needed for sleep. 30 tablet 2  ? capecitabine (XELODA) 500 MG tablet  Take 3 tablets by mouth in AM and 2 tablets in PM. Take with food. Take for 14 days, then hold for 7 days. Repeat every 21 days. Do not start until you see MD (Patient taking differently: Take 500 mg by mouth daily.) 60 tablet 0  ? losartan (COZAAR) 25 MG tablet Take 0.5 tablets (12.5 mg total) by mouth daily. (Patient not taking: No sig reported) 30 tablet 2  ? ?No facility-administered medications prior to visit.  ?  ? ?No Known Allergies ? ?Social History  ? ?Tobacco Use  ? Smoking status: Never  ? Smokeless tobacco: Never  ?Vaping Use  ? Vaping Use: Never used   ?Substance Use Topics  ? Alcohol use: Yes  ?  Alcohol/week: 1.0 standard drink  ?  Types: 1 Cans of beer per week  ?  Comment: occa  ? Drug use: Yes  ?  Types: Cocaine, Marijuana  ? ? ?Social History  ? ?Substance and Sexual Activity  ?Sexual Activity Not Currently  ? Partners: Male  ? Birth control/protection: Condom  ? Comment: DECLINED CONDOMS 06/2020  ? ? ? ?Objective:  ? ?Vitals:  ? 07/15/21 1518  ?BP: 119/78  ?Pulse: 82  ?Resp: 16  ?SpO2: 98%  ?Weight: 98 lb 6.4 oz (44.6 kg)  ?Height: '5\' 1"'  (1.549 m)  ? ?Body mass index is 18.59 kg/m?. ? ? ?Physical Exam ?Constitutional:   ?   Appearance: She is well-developed.  ?   Comments: Seated comfortably in chair.   ?HENT:  ?   Mouth/Throat:  ?   Mouth: Mucous membranes are moist. No oral lesions.  ?   Dentition: Normal dentition. No dental abscesses.  ?   Pharynx: Oropharynx is clear. No oropharyngeal exudate.  ?   Comments: No evidence of thrush. Tonsils are appropriately sized and no evidence of obstruction or anatomic explanation.  ?Cardiovascular:  ?   Rate and Rhythm: Normal rate and regular rhythm.  ?   Heart sounds: Normal heart sounds.  ?Pulmonary:  ?   Effort: Pulmonary effort is normal.  ?   Breath sounds: Normal breath sounds.  ?Abdominal:  ?   General: There is no distension.  ?   Palpations: Abdomen is soft.  ?   Tenderness: There is no abdominal tenderness.  ?Lymphadenopathy:  ?   Cervical: No cervical adenopathy.  ?Skin: ?   General: Skin is warm and dry.  ?   Findings: No rash.  ?Neurological:  ?   Mental Status: She is alert and oriented to person, place, and time.  ?Psychiatric:     ?   Judgment: Judgment normal.  ?   Comments: In good spirits today and engaged in care discussion  ? ? ? ?Lab Results ?Lab Results  ?Component Value Date  ? WBC 7.9 12/20/2020  ? HGB 6.9 (L) 12/20/2020  ? HCT 24.9 (L) 12/20/2020  ? MCV 64.8 (L) 12/20/2020  ? PLT 429 (H) 12/20/2020  ?  ?Lab Results  ?Component Value Date  ? CREATININE 0.65 12/20/2020  ? BUN 16 12/20/2020   ? NA 137 12/20/2020  ? K 4.0 12/20/2020  ? CL 105 12/20/2020  ? CO2 24 12/20/2020  ?  ?Lab Results  ?Component Value Date  ? ALT 7 12/20/2020  ? AST 9 (L) 12/20/2020  ? ALKPHOS 50 08/10/2020  ? BILITOT 0.4 12/20/2020  ?  ?Lab Results  ?Component Value Date  ? CHOL 193 03/01/2020  ? HDL 66 03/01/2020  ? [place] 110 (H) 03/01/2020  ?  TRIG 81 03/01/2020  ? CHOLHDL 2.9 03/01/2020  ? ?HIV 1 RNA Quant  ?Date Value  ?12/20/2020 Not Detected Copies/mL  ?03/01/2020 <20 Copies/mL  ?10/12/2019 <20 NOT DETECTED copies/mL  ? ?CD4 T Cell Abs (/uL)  ?Date Value  ?12/20/2020 462  ?03/01/2020 338 (L)  ?10/12/2019 328 (L)  ? ? ? ?Assessment & Plan:  ? ?Problem List Items Addressed This Visit   ? ?  ? High  ? HIV (human immunodeficiency virus infection) ([place]) (Chronic)  ?  Doing well on biktarvy but has been unable to swallow pills and certain foods of late. She has always been interested in cabenuva injectable option for treatment and we can switch her to these now vs crushing tivicay/descovy regimen.  ?Will set her up with visit with me next week to load 600/900 mg dose cabenuva. Explained regimen of injections for loading and transition to maintenance. She is interested in q8w dose pattern.  ?Will notify pharmacy team.  ?  ?  ?  ? Unprioritized  ? Esophageal dysphagia - Primary  ?  New problem - For the last 3 weeks has had a hard time swallowing her medications and certain foods. Has not been able to identify patterns of a texture of foods, it seems to just be a variety. H/O esophageal candidiasis in the past however I see no evidence of thrust orally and her immune system has improved significantly making this much less likely.  ?No chest pains or fevers. No dental pain. No vocal changes. She reports a history of previous esophageal narrowing in the past that required intervention - will refer back to [place] for assistance in management and evaluation of the problem. She has seen Dr. Havery Moros in the past.  ?  ?  ? Relevant  Orders  ? Ambulatory referral to Gastroenterology  ? ?Janene Madeira, MSN, NP-C ?[facility] ?[facility] ?Pager: [phone] ?Office: [phone] ? ?04/05/2

## 2021-07-16 ENCOUNTER — Other Ambulatory Visit: Payer: Self-pay | Admitting: Pharmacist

## 2021-07-16 DIAGNOSIS — Z21 Asymptomatic human immunodeficiency virus [HIV] infection status: Secondary | ICD-10-CM

## 2021-07-16 MED ORDER — CABOTEGRAVIR & RILPIVIRINE ER 600 & 900 MG/3ML IM SUER: 1.0000 | INTRAMUSCULAR | 5 refills | Status: DC

## 2021-07-16 MED ORDER — CABOTEGRAVIR & RILPIVIRINE ER 600 & 900 MG/3ML IM SUER: 1.0000 | INTRAMUSCULAR | 1 refills | Status: DC

## 2021-07-17 DIAGNOSIS — R1319 Other dysphagia: Secondary | ICD-10-CM | POA: Insufficient documentation

## 2021-07-17 NOTE — Assessment & Plan Note (Signed)
Doing well on biktarvy but has been unable to swallow pills and certain foods of late. She has always been interested in cabenuva injectable option for treatment and we can switch her to these now vs crushing tivicay/descovy regimen.  ?Will set her up with visit with me next week to load 600/900 mg dose cabenuva. Explained regimen of injections for loading and transition to maintenance. She is interested in q8w dose pattern.  ?Will notify pharmacy team.  ?

## 2021-07-17 NOTE — Assessment & Plan Note (Addendum)
New problem - For the last 3 weeks has had a hard time swallowing her medications and certain foods. Has not been able to identify patterns of a texture of foods, it seems to just be a variety. H/O esophageal candidiasis in the past however I see no evidence of thrust orally and her immune system has improved significantly making this much less likely.  ?No chest pains or fevers. No dental pain. No vocal changes. She reports a history of previous esophageal narrowing in the past that required intervention - will refer back to [place] for assistance in management and evaluation of the problem. She has seen Dr. Havery Moros in the past.  ?

## 2021-07-18 ENCOUNTER — Telehealth: Payer: Self-pay

## 2021-07-18 NOTE — Telephone Encounter (Signed)
[facility] Patient Advocate Encounter ? ?Patient's medication Kern Reap) have been couriered to [facility] from [employer] and will be administered on the patient next office visit on 07/23/21. ? ?Ileene Patrick , CPhT ?Specialty Pharmacy Patient Advocate ?[facility] ?Phone: [phone] ?Fax:  [phone]  ?

## 2021-07-23 ENCOUNTER — Ambulatory Visit (INDEPENDENT_AMBULATORY_CARE_PROVIDER_SITE_OTHER): Payer: Self-pay | Admitting: Infectious Diseases

## 2021-07-23 ENCOUNTER — Encounter: Payer: Self-pay | Admitting: Infectious Diseases

## 2021-07-23 ENCOUNTER — Other Ambulatory Visit: Payer: Self-pay

## 2021-07-23 DIAGNOSIS — Z21 Asymptomatic human immunodeficiency virus [HIV] infection status: Secondary | ICD-10-CM

## 2021-07-23 MED ORDER — CABOTEGRAVIR & RILPIVIRINE ER 600 & 900 MG/3ML IM SUER
1.0000 | Freq: Once | INTRAMUSCULAR | Status: AC
  Administered 2021-07-23: 1 via INTRAMUSCULAR

## 2021-07-23 NOTE — Addendum Note (Signed)
Addended by: Leatrice Jewels on: 07/23/2021 04:28 PM ? ? Modules accepted: Orders ? ?
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
## 2021-07-23 NOTE — Patient Instructions (Signed)
We gave you your injection of CABENUVA for treatment today.  ? ?Please stop your Biktarvy pills now.  ? ?Your next appointment has been scheduled in 1 month on 08/20/2021 ? ?It is very important to keep your appointments for treatment scheduled in a specific range to ensure that the medication works well for you. For this reason you will have appointments set out a few months ahead of time to keep you on track.  ? ? ? ? ?Helpful Tips:  ?If you experience any knots under the skin please use a warm compress to help.  Hot baths or showers can also help.  ? ?Do not massage the muscles where you received your injection.  ? ?Some people experience some redness at the site of the injection. This can be normal and should go away soon.  ? ?Moving around today is a good idea, if you sit too much it hurts more.  ? ?Please call the office to speak with our pharmacy or triage team if you have any questions or concerns.  ? ?

## 2021-07-23 NOTE — Assessment & Plan Note (Signed)
Here for loading dose of cabenuva medication. Explained that she will return in 30d for next dose then come every 2 months with target date the 11th.  ? ?Will continue with Q11mviral load monitoring during first 6 months for therapeutic treatment monitoring. Will start this with first maintenance injection next visit.  ? ?Last VL result:  ?HIV 1 RNA Quant (Copies/mL)  ?Date Value  ?12/20/2020 Not Detected  ? ? ?Dose Interval: q266mNext Appointment: 08/20/2021 ? ?Contraception Plan: abstinence  ? ? ?

## 2021-07-25 ENCOUNTER — Other Ambulatory Visit: Payer: Self-pay

## 2021-07-25 NOTE — Telephone Encounter (Signed)
Letter sent to patient via mychart. Relayed providers message to patient. She will look into FMLA through her employer.  ?Leatrice Jewels, RMA ? ?

## 2021-07-25 NOTE — Telephone Encounter (Signed)
Please advise:  ?Called patient regarding mychart message.  ?Advised she alternate between tylenol and ibuprofen to help pain. Also to use warm/ cold compress to help with any swelling/discomfort. Recommended she try to move her legs as much as possible as well. ?Is requesting doctors note for today. States she had to call out of work. Would like note to be sent to her mychart.  ?Leatrice Jewels, RMA ? ?

## 2021-08-14 ENCOUNTER — Telehealth: Payer: Self-pay

## 2021-08-14 NOTE — Telephone Encounter (Signed)
[facility] Patient Advocate Encounter ? ?Patient's medication Kern Reap) have been couriered to [facility] from [employer] and will be administered on the patient next office visit on 08/20/21. ? ?Ileene Patrick , CPhT ?Specialty Pharmacy Patient Advocate ?[facility] ?Phone: [phone] ?Fax:  [phone]  ?

## 2021-08-16 ENCOUNTER — Ambulatory Visit (HOSPITAL_COMMUNITY)
Admission: EM | Admit: 2021-08-16 | Discharge: 2021-08-16 | Disposition: A | Discharge status: Home / Self Care | Payer: Self-pay | Attending: Nurse Practitioner | Admitting: Nurse Practitioner

## 2021-08-16 ENCOUNTER — Encounter (HOSPITAL_COMMUNITY): Payer: Self-pay

## 2021-08-16 ENCOUNTER — Telehealth (HOSPITAL_COMMUNITY): Payer: Self-pay | Admitting: Emergency Medicine

## 2021-08-16 DIAGNOSIS — R109 Unspecified abdominal pain: Secondary | ICD-10-CM

## 2021-08-16 LAB — POCT URINALYSIS DIPSTICK, ED / UC
Bilirubin Urine: NEGATIVE
Glucose, UA: NEGATIVE mg/dL
Ketones, ur: NEGATIVE mg/dL
Nitrite: NEGATIVE
Protein, ur: 30 mg/dL — AB
Specific Gravity, Urine: 1.01 (ref 1.005–1.030)
Urobilinogen, UA: 0.2 mg/dL (ref 0.0–1.0)
pH: 7 (ref 5.0–8.0)

## 2021-08-16 MED ORDER — HYDROCODONE-ACETAMINOPHEN 5-325 MG PO TABS: 1.0000 | ORAL_TABLET | Freq: Four times a day (QID) | ORAL | 0 refills | Status: DC | PRN

## 2021-08-16 MED ORDER — CEFTRIAXONE SODIUM 1 G IJ SOLR
1.0000 g | Freq: Once | INTRAMUSCULAR | Status: AC
  Administered 2021-08-16: 1 g via INTRAMUSCULAR

## 2021-08-16 MED ORDER — HYDROCODONE-ACETAMINOPHEN 5-325 MG PO TABS
1.0000 | ORAL_TABLET | Freq: Four times a day (QID) | ORAL | 0 refills | Status: DC | PRN
Start: 2021-08-16 — End: 2022-02-11

## 2021-08-16 MED ORDER — CIPROFLOXACIN HCL 500 MG PO TABS: 500.0000 mg | ORAL_TABLET | Freq: Two times a day (BID) | ORAL | 0 refills | Status: AC

## 2021-08-16 MED ORDER — TAMSULOSIN HCL 0.4 MG PO CAPS: 0.4000 mg | ORAL_CAPSULE | Freq: Every day | ORAL | 0 refills | Status: DC

## 2021-08-16 MED ORDER — CEFTRIAXONE SODIUM 1 G IJ SOLR
INTRAMUSCULAR | Status: AC
  Filled 2021-08-16: qty 10

## 2021-08-16 MED ORDER — LIDOCAINE HCL (PF) 1 % IJ SOLN
INTRAMUSCULAR | Status: AC
  Filled 2021-08-16: qty 4

## 2021-08-16 MED ORDER — CIPROFLOXACIN HCL 500 MG PO TABS: 500.0000 mg | ORAL_TABLET | Freq: Two times a day (BID) | ORAL | 0 refills | Status: DC

## 2021-08-16 NOTE — Discharge Instructions (Signed)
-   The urine sample today shows a possible urinary tract infection and possibly a kidney stone ?-We have given you a shot of antibiotics today to help with any possible kidney stone or infection ?-Please start on the antibiotics twice daily for 5 days; we are sending the urine for culture and will let you know if we need to change antibiotics ?-Please start Flomax at nighttime to help with a possible kidney stone help this to pass easier ?-You can use Vicodin every 6 hours as needed for severe pain; please use this sparingly ?-If your symptoms worsen significantly and you develop nausea/vomiting and are unable to keep fluids down or severe pain, please go to the emergency room ?

## 2021-08-16 NOTE — ED Triage Notes (Signed)
2 day h/o right flank pain that has worsened since the onset. No meds taken. Denies urinary frequency and urgency. Confirms dysuria. ?
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
## 2021-08-16 NOTE — Telephone Encounter (Signed)
Spoke to Shirley White from the pharmacy regarding cancelling rx that were previously sent in. Pharmacy confirmed cancellation.  ?

## 2021-08-20 ENCOUNTER — Encounter: Payer: Self-pay | Admitting: Infectious Diseases

## 2021-08-20 ENCOUNTER — Ambulatory Visit: Payer: Self-pay | Admitting: Infectious Diseases

## 2021-08-20 ENCOUNTER — Other Ambulatory Visit: Payer: Self-pay

## 2021-08-20 DIAGNOSIS — Z21 Asymptomatic human immunodeficiency virus [HIV] infection status: Secondary | ICD-10-CM

## 2021-08-20 MED ORDER — CABOTEGRAVIR & RILPIVIRINE ER 600 & 900 MG/3ML IM SUER
1.0000 | Freq: Once | INTRAMUSCULAR | Status: AC
  Administered 2021-08-20: 1 via INTRAMUSCULAR

## 2021-08-20 NOTE — Progress Notes (Signed)
? ?Subjective:  ? ? Patient ID: Shirley White, female    DOB: November 17, 1970, 51 y.o.   MRN: [id number] ? ?CC:  ?Cabenuva injection - first maintenance injection ? ? ?HPI: ? ?Shirley White is a 50 y.o. female with well controlled HIV here for her first maintenance injection of Cabenuva. She had a lot of soreness with the first injection that landed her out of work for a little. Shortly after she had ER visit for UTI and probable kidney stone - improved after flomax + ciprofloxacin.  ?She took tomorrow off incase she has some ongoing soreness like last time. ? ? ? ?No Known Allergies ? ? ? ?Outpatient Medications Prior to Visit  ?Medication Sig Dispense Refill  ? cabotegravir & rilpivirine ER (CABENUVA) 600 & 900 MG/3ML injection Inject 1 kit into the muscle every 2 (two) months. 6 mL 5  ? ciprofloxacin (CIPRO) 500 MG tablet Take 1 tablet (500 mg total) by mouth every 12 (twelve) hours for 5 days. 10 tablet 0  ? HYDROcodone-acetaminophen (NORCO/VICODIN) 5-325 MG tablet Take 1 tablet by mouth every 6 (six) hours as needed for severe pain or moderate pain. 6 tablet 0  ? tamsulosin (FLOMAX) 0.4 MG CAPS capsule Take 1 capsule (0.4 mg total) by mouth at bedtime. 14 capsule 0  ? amLODipine (NORVASC) 10 MG tablet Take 1 tablet (10 mg total) by mouth daily. (Patient not taking: Reported on 07/23/2021) 30 tablet 2  ? capecitabine (XELODA) 500 MG tablet Take 3 tablets by mouth in AM and 2 tablets in PM. Take with food. Take for 14 days, then hold for 7 days. Repeat every 21 days. Do not start until you see MD (Patient not taking: Reported on 07/23/2021) 60 tablet 0  ? ferrous sulfate 220 (44 Fe) MG/5ML solution Take 5 mLs (220 mg total) by mouth 3 (three) times daily with meals. Take at least 2 hours after Biktarvy (Patient not taking: Reported on 07/23/2021) 450 mL 2  ? gabapentin (NEURONTIN) 300 MG capsule TAKE 1 CAPSULE(300 MG) BY MOUTH THREE TIMES DAILY (Patient not taking: Reported on 07/23/2021) 90 capsule 3  ? losartan  (COZAAR) 25 MG tablet Take 0.5 tablets (12.5 mg total) by mouth daily. (Patient not taking: Reported on 06/28/2020) 30 tablet 2  ? prochlorperazine (COMPAZINE) 10 MG tablet Take 1 tablet (10 mg total) by mouth every 6 (six) hours as needed for nausea. (Patient not taking: Reported on 07/23/2021) 30 tablet 1  ? traZODone (DESYREL) 50 MG tablet Take 0.5-1 tablets (25-50 mg total) by mouth at bedtime as needed for sleep. (Patient not taking: Reported on 07/23/2021) 30 tablet 2  ? ?No facility-administered medications prior to visit.  ? ? ? ?Past Medical History:  ?Diagnosis Date  ? Anemia   ? GERD (gastroesophageal reflux disease)   ? no meds  ? Heart murmur   ? HIV disease ([place])   ? ? ? ? ?Past Surgical History:  ?Procedure Laterality Date  ? CESAREAN SECTION    ? ESOPHAGOGASTRODUODENOSCOPY N/A 02/03/2016  ? Procedure: ESOPHAGOGASTRODUODENOSCOPY (EGD);  Surgeon: Manus Gunning, MD;  Location: [facility];  Service: Gastroenterology;  Laterality: N/A;  ? LAPAROSCOPIC APPENDECTOMY N/A 07/14/2019  ? Procedure: LAPAROSCOPIC APPENDECTOMY CONVERTED TO OPEN RIGHT HEMICOLECTOMY;  Surgeon: Coralie Keens, MD;  Location: [facility];  Service: General;  Laterality: N/A;  ? RADIOACTIVE SEED GUIDED EXCISIONAL BREAST BIOPSY Right 06/23/2018  ? Procedure: RADIOACTIVE SEED GUIDED EXCISIONAL RIGHT BREAST BIOPSY;  Surgeon: Stark Klein, MD;  Location[facility];  Service: General;  Laterality: Right;  ? ? ? ? ?Review of Systems ? ?   ? ?Objective:  ?  ?BP 133/80   Pulse 71   Temp 98.2 ?F (36.8 ?C) (Temporal)   Wt 92 lb (41.7 kg)   LMP 07/21/2021 (Exact Date)   SpO2 100%   BMI 17.38 kg/m?  ?Nursing note and vital signs reviewed. ? ?Physical Exam ? ?   ? ?Assessment & Plan:  ? ?Patient Active Problem List  ? Diagnosis Date Noted  ? HIV (human immunodeficiency virus infection) ([place]) 07/14/2018  ? Esophageal dysphagia 07/17/2021  ? Heart murmur 12/27/2019  ? Neuropathy 08/19/2019  ? Hypertension 08/19/2019  ?  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 Adenocarcinoma, colon ([place]) 07/20/2019  ? 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 Thrombocytopenia ([place]) 12/12/2017  ? Routine health maintenance 08/28/2016  ? H/O menorrhagia 09/05/2014  ? Iron deficiency anemia 09/05/2014  ? Sickle-cell trait ([place]) 06/04/2006  ? ? ?Problem List Items Addressed This Visit   ? ?  ? High  ? HIV (human immunodeficiency virus infection) ([place]) (Chronic)  ?  Tolerating Cabenuva well with some ISR - she received her first set of maintenance injections today. Has tomorrow off to rest.  ?Next apt in 6mwith TD 11th. She will schedule the next few appts with pharmacy team.  ?VL next injection for therapeutic monitoring along with CMP.  ? ?  ?  ? ? ? ?SJanene Madeira MSN, NP-C ?[facility] ?[facility]  ?[email or web address] ?Pager: [phone]?Office: [phone]?[facility] Main Line: [phone] ? ?

## 2021-08-20 NOTE — Assessment & Plan Note (Signed)
Tolerating Cabenuva well with some ISR - she received her first set of maintenance injections today. Has tomorrow off to rest.  ?Next apt in 32mwith TD 11th. She will schedule the next few appts with pharmacy team.  ?VL next injection for therapeutic monitoring along with CMP.  ?

## 2021-08-20 NOTE — Patient Instructions (Addendum)
Please schedule your next injection visit with our pharmacy team. This will be July 11th (or around that date).  ? ?We will check your viral load next visit here.  ? ?Reach out to [facility] to schedule an appointment -  ?[phone] option 1 ?  ?

## 2021-08-20 NOTE — Addendum Note (Signed)
Addended by: Leatrice Jewels on: 08/20/2021 04:03 PM ? ? Modules accepted: Orders ? ?

## 2021-09-23 ENCOUNTER — Encounter: Payer: Self-pay | Admitting: Infectious Diseases

## 2021-10-17 ENCOUNTER — Telehealth: Payer: Self-pay

## 2021-10-17 NOTE — Telephone Encounter (Signed)
[facility] Patient Advocate Encounter  Patient's medication Kern Reap) have been couriered to [facility] from [employer] and will be administered on the patient next office visit on 10/23/01.  Ileene Patrick , [facility] Patient [facility] Phone: [phone] Fax:  [phone]

## 2021-10-23 ENCOUNTER — Other Ambulatory Visit: Payer: Self-pay

## 2021-10-23 ENCOUNTER — Ambulatory Visit (INDEPENDENT_AMBULATORY_CARE_PROVIDER_SITE_OTHER): Payer: Self-pay | Admitting: Pharmacist

## 2021-10-23 DIAGNOSIS — Z21 Asymptomatic human immunodeficiency virus [HIV] infection status: Secondary | ICD-10-CM

## 2021-10-23 MED ORDER — CABOTEGRAVIR & RILPIVIRINE ER 600 & 900 MG/3ML IM SUER
1.0000 | Freq: Once | INTRAMUSCULAR | Status: AC
  Administered 2021-10-23: 1 via INTRAMUSCULAR

## 2021-10-23 NOTE — Progress Notes (Signed)
HPI: Shirley White is a 51 y.o. female who presents to the [facility] clinic for [place] administration.  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   CABOTEGRAVIR & RILPIVIRINE ER (CABENUVA) 600 & 900 MG/3ML INJECTION    Inject 1 kit into the muscle every 2 (two) months.   CAPECITABINE (XELODA) 500 MG TABLET    Take 3 tablets by mouth in AM and 2 tablets in PM. Take with food. Take for 14 days, then hold for 7 days. Repeat every 21 days. Do not start until you see MD   FERROUS SULFATE 220 (44 FE) MG/5ML SOLUTION    Take 5 mLs (220 mg total) by mouth 3 (three) times daily with meals. Take at least 2 hours after Biktarvy   GABAPENTIN (NEURONTIN) 300 MG CAPSULE    TAKE 1 CAPSULE(300 MG) BY MOUTH THREE TIMES DAILY   HYDROCODONE-ACETAMINOPHEN (NORCO/VICODIN) 5-325 MG TABLET    Take 1 tablet by mouth every 6 (six) hours as needed for severe pain or moderate pain.   LOSARTAN (COZAAR) 25 MG TABLET    Take 0.5 tablets (12.5 mg total) by mouth daily.   PROCHLORPERAZINE (COMPAZINE) 10 MG TABLET    Take 1 tablet (10 mg total) by mouth every 6 (six) hours as needed for nausea.   TAMSULOSIN (FLOMAX) 0.4 MG CAPS CAPSULE    Take 1 capsule (0.4 mg total) by mouth at bedtime.   TRAZODONE (DESYREL) 50 MG TABLET    Take 0.5-1 tablets  (25-50 mg total) by mouth at bedtime as needed for sleep.  Modified Medications   No medications on file  Discontinued Medications   No medications on file    Allergies: No Known Allergies  Past Medical History: Past Medical History:  Diagnosis Date   Anemia    GERD (gastroesophageal reflux disease)    no meds   Heart murmur    HIV disease (HCC)     Social History: Social History   Socioeconomic History   Marital status: Single    Spouse name: Not on file   Number of children: 4   Years of education: Not on file   Highest education level: 12th grade  Occupational History   Occupation: Scientist, water quality at [employer]  Tobacco Use   Smoking status: Never   Smokeless tobacco: Never  Vaping Use   Vaping Use: Never used  Substance and Sexual Activity   Alcohol use: Yes    Alcohol/week: 1.0 standard drink of alcohol    Types: 1 Cans of beer per week    Comment: occa   Drug use: Yes    Types: Cocaine, Marijuana   Sexual activity: Not Currently    Partners: Male    Birth control/protection: Condom    Comment: DECLINED CONDOMS 06/2020  Other Topics Concern   Not on file  Social History Narrative   Patient lives alone   Patient has to take public transportation   Social Determinants of Health   Financial Resource Strain: Not on file  Food Insecurity: Food Insecurity Present (04/29/2018)   Hunger Vital Sign    Worried About Running Out of Food in the Last Year: Never true    Ran Out of Food in the Last Year: Often true  Transportation Needs: Unmet Transportation Needs (04/01/2018)   PRAPARE - Hydrologist (Medical): Yes    Lack of Transportation (Non-Medical): Yes  Physical Activity: Not on file  Stress: Not on file  Social Connections: Not on file    Labs: Lab Results  Component Value Date   HIV1RNAQUANT Not Detected 12/20/2020   HIV1RNAQUANT <20 03/01/2020   HIV1RNAQUANT <20 NOT DETECTED 10/12/2019   CD4TABS 462 12/20/2020   CD4TABS  338 (L) 03/01/2020   CD4TABS 328 (L) 10/12/2019    RPR and STI Lab Results  Component Value Date   LABRPR NON-REACTIVE 12/20/2020   LABRPR NON-REACTIVE 06/22/2018   LABRPR NON REAC 08/21/2015   LABRPR NON REAC 09/05/2014   LABRPR NON REAC 09/24/2009    STI Results GC CT  04/29/2018 12:00 AM Negative  Negative   03/22/2018 12:00 AM Negative  Negative   08/21/2015 12:00 AM Negative  Negative   09/05/2014 12:00 AM Negative  Negative     Hepatitis B Lab Results  Component Value Date   HEPBSAB NEG 09/05/2014   HEPBSAG NEGATIVE 09/05/2014   HEPBCAB NON REACTIVE 09/05/2014   Hepatitis C No results found for: "HEPCAB", "HCVRNAPCRQN" Hepatitis A Lab Results  Component Value Date   HAV REACTIVE (A) 09/05/2014   Lipids: Lab Results  Component Value Date   CHOL 193 03/01/2020   TRIG 81 03/01/2020   HDL 66 03/01/2020   CHOLHDL 2.9 03/01/2020   VLDL 12 08/21/2015   LDLCALC 110 (H) 03/01/2020    TARGET DATE:  The 11th of the month  Current HIV Regimen: Cabenuva  Assessment: Vandy presents today for their maintenance Cabenuva injections. Initial/past injections were tolerated well without issues. No problems with systemic effects of injections. Patient did experience significant pain and soreness after the first injection which tremendously improved after her second shot. Will check HIV RNA and CMP today.   Administered cabotegravir 613m/3mL in left upper outer quadrant of the gluteal muscle. Administered rilpivirine 900 mg/380min the right upper outer quadrant of the gluteal muscle. Monitored patient for 10 minutes after injection. Injections were tolerated well without issue. Patient will follow up in 2 months for next injection.  Patient due for her tetanus booster which she deferred today. She also is eligible for the hepatitis B vaccine series again as she remains non-reactive after previous vaccination. She would like to think on these and receive at a follow-up  visit.    Plan: - Cabenuva injections administered - Next injections scheduled for 9/6 with me and 11/16 with [facility] [place]- Call with any issues or questions  AmAlfonse SprucePharmD, CPP Clinical Pharmacist Practitioner [facility]

## 2021-10-26 LAB — COMPREHENSIVE METABOLIC PANEL
AG Ratio: 1.1 (calc) (ref 1.0–2.5)
ALT: 7 U/L (ref 6–29)
AST: 9 U/L — ABNORMAL LOW (ref 10–35)
Albumin: 4 g/dL (ref 3.6–5.1)
Alkaline phosphatase (APISO): 44 U/L (ref 31–125)
BUN: 8 mg/dL (ref 7–25)
CO2: 24 mmol/L (ref 20–32)
Calcium: 9.1 mg/dL (ref 8.6–10.2)
Chloride: 102 mmol/L (ref 98–110)
Creat: 0.52 mg/dL (ref 0.50–0.99)
Globulin: 3.5 g/dL (calc) (ref 1.9–3.7)
Glucose, Bld: 88 mg/dL (ref 65–99)
Potassium: 3.7 mmol/L (ref 3.5–5.3)
Sodium: 135 mmol/L (ref 135–146)
Total Bilirubin: 0.6 mg/dL (ref 0.2–1.2)
Total Protein: 7.5 g/dL (ref 6.1–8.1)

## 2021-10-26 LAB — HIV-1 RNA QUANT-NO REFLEX-BLD
HIV 1 RNA Quant: NOT DETECTED Copies/mL
HIV-1 RNA Quant, Log: NOT DETECTED Log cps/mL

## 2021-12-11 ENCOUNTER — Telehealth: Payer: Self-pay | Admitting: Pharmacist

## 2021-12-11 NOTE — Telephone Encounter (Signed)
Patient's specialty medication Kern Reap) was delivered from [facility] and will be administered at next office visit on 9/6.  Alfonse Spruce, PharmD, CPP, [place] Clinical Pharmacist Practitioner Infectious [place] [facility]

## 2021-12-18 ENCOUNTER — Encounter: Payer: Self-pay | Admitting: Pharmacist

## 2021-12-25 ENCOUNTER — Other Ambulatory Visit: Payer: Self-pay

## 2021-12-25 ENCOUNTER — Ambulatory Visit (INDEPENDENT_AMBULATORY_CARE_PROVIDER_SITE_OTHER): Payer: Self-pay | Admitting: Pharmacist

## 2021-12-25 DIAGNOSIS — Z21 Asymptomatic human immunodeficiency virus [HIV] infection status: Secondary | ICD-10-CM

## 2021-12-25 MED ORDER — CABOTEGRAVIR & RILPIVIRINE ER 600 & 900 MG/3ML IM SUER
1.0000 | Freq: Once | INTRAMUSCULAR | Status: AC
  Administered 2021-12-25: 1 via INTRAMUSCULAR

## 2021-12-25 NOTE — Progress Notes (Signed)
HPI: Shirley White is a 51 y.o. female who presents to the Shirley White clinic for Shirley White administration.  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   CABOTEGRAVIR & RILPIVIRINE ER (CABENUVA) 600 & 900 MG/3ML INJECTION    Inject 1 kit into the muscle every 2 (two) months.   CAPECITABINE (XELODA) 500 MG TABLET    Take 3 tablets by mouth in AM and 2 tablets in PM. Take with food. Take for 14 days, then hold for 7 days. Repeat every 21 days. Do not start until you see MD   FERROUS SULFATE 220 (44 FE) MG/5ML SOLUTION    Take 5 mLs (220 mg total) by mouth 3 (three) times daily with meals. Take at least 2 hours after Biktarvy   GABAPENTIN (NEURONTIN) 300 MG CAPSULE    TAKE 1 CAPSULE(300 MG) BY MOUTH THREE TIMES DAILY   HYDROCODONE-ACETAMINOPHEN (NORCO/VICODIN) 5-325 MG TABLET    Take 1 tablet by mouth every 6 (six) hours as needed for severe pain or moderate pain.   LOSARTAN (COZAAR) 25 MG TABLET    Take 0.5 tablets (12.5 mg total) by mouth daily.   PROCHLORPERAZINE (COMPAZINE) 10 MG TABLET    Take 1 tablet (10 mg total) by mouth every 6 (six) hours as needed for nausea.   TAMSULOSIN (FLOMAX) 0.4 MG CAPS CAPSULE    Take 1 capsule (0.4 mg total) by mouth at bedtime.   TRAZODONE (DESYREL) 50 MG TABLET    Take 0.5-1 tablets  (25-50 mg total) by mouth at bedtime as needed for sleep.  Modified Medications   No medications on file  Discontinued Medications   No medications on file    Allergies: No Known Allergies  Past Medical History: Past Medical History:  Diagnosis Date   Anemia    GERD (gastroesophageal reflux disease)    no meds   Heart murmur    HIV disease (HCC)     Social History: Social History   Socioeconomic History   Marital status: Single    Spouse name: Not on file   Number of children: 4   Years of education: Not on file   Highest education level: 12th grade  Occupational History   Occupation: Scientist, water quality at [employer]  Tobacco Use   Smoking status: Never   Smokeless tobacco: Never  Vaping Use   Vaping Use: Never used  Substance and Sexual Activity   Alcohol use: Yes    Alcohol/week: 1.0 standard drink of alcohol    Types: 1 Cans of beer per week    Comment: occa   Drug use: Yes    Types: Cocaine, Marijuana   Sexual activity: Not Currently    Partners: Male    Birth control/protection: Condom    Comment: DECLINED CONDOMS 06/2020  Other Topics Concern   Not on file  Social History Narrative   Patient lives alone   Patient has to take public transportation   Social Determinants of Health   Financial Resource Strain: Not on file  Food Insecurity: Food Insecurity Present (04/29/2018)   Hunger Vital Sign    Worried About Running Out of Food in the Last Year: Never true    Ran Out of Food in the Last Year: Often true  Transportation Needs: Unmet Transportation Needs (04/01/2018)   PRAPARE - Hydrologist (Medical): Yes    Lack of Transportation (Non-Medical): Yes  Physical Activity: Not on file  Stress: Not on file  Social Connections: Not on file    Labs: Lab Results  Component Value Date   HIV1RNAQUANT Not Detected 10/23/2021   HIV1RNAQUANT Not Detected 12/20/2020   HIV1RNAQUANT <20 03/01/2020   CD4TABS 462 12/20/2020   CD4TABS 338  (L) 03/01/2020   CD4TABS 328 (L) 10/12/2019    RPR and STI Lab Results  Component Value Date   LABRPR NON-REACTIVE 12/20/2020   LABRPR NON-REACTIVE 06/22/2018   LABRPR NON REAC 08/21/2015   LABRPR NON REAC 09/05/2014   LABRPR NON REAC 09/24/2009    STI Results GC CT  04/29/2018 12:00 AM Negative  Negative   03/22/2018 12:00 AM Negative  Negative   08/21/2015 12:00 AM Negative  Negative   09/05/2014 12:00 AM Negative  Negative     Hepatitis B Lab Results  Component Value Date   HEPBSAB NEG 09/05/2014   HEPBSAG NEGATIVE 09/05/2014   HEPBCAB NON REACTIVE 09/05/2014   Hepatitis C No results found for: "HEPCAB", "HCVRNAPCRQN" Hepatitis A Lab Results  Component Value Date   HAV REACTIVE (A) 09/05/2014   Lipids: Lab Results  Component Value Date   CHOL 193 03/01/2020   TRIG 81 03/01/2020   HDL 66 03/01/2020   CHOLHDL 2.9 03/01/2020   VLDL 12 08/21/2015   LDLCALC 110 (H) 03/01/2020    TARGET DATE:  The 11th of the month  Current HIV Regimen: Cabenuva  Assessment: Shirley White presents today for their maintenance Cabenuva injections. Initial/past injections were tolerated well without issues. No problems with systemic effects of injections. Administered cabotegravir 688m/3mL in left upper outer quadrant of the gluteal muscle. Administered rilpivirine 900 mg/330min the right upper outer quadrant of the gluteal muscle. Monitored patient for 10 minutes after injection. Injections were tolerated well without issue. Patient will follow up in 2 months for next injection. Will check HIV RNA today. Told [name] that she can discuss spacing out lab draws and appointments with pharmacy/Shirley White at her follow up with Shirley White She does complain of "her normal thrush symptoms" today. She states it tingles when she brushes her teeth and then she wakes up with white spots in the corner of her lips. I did not note any abnormalities or anything that resembled thrush when looking in her  mouth. Encouraged her to try Biotene if experiencing dry mouth that could cause mouth sores and to brush her teeth more gently if possible. Reviewed that I would let Shirley White anything else if needed.  Plan: - Cabenuva injections administered - Check HIV RNA  - Next injections scheduled for 11/16 with Shirley Maryland Call with any issues or questions  AmAlfonse SprucePharmD, CPP, BCBabbielinical Pharmacist Practitioner [facility]

## 2021-12-27 ENCOUNTER — Encounter: Payer: Self-pay | Admitting: Pharmacist

## 2021-12-27 ENCOUNTER — Telehealth: Payer: Self-pay | Admitting: Pharmacist

## 2021-12-27 LAB — HIV-1 RNA QUANT-NO REFLEX-BLD
HIV 1 RNA Quant: NOT DETECTED Copies/mL
HIV-1 RNA Quant, Log: NOT DETECTED Log cps/mL

## 2021-12-27 NOTE — Telephone Encounter (Signed)
Patient called this morning stating she is experiencing a high level of soreness and pain after her most recent injections. States the pain starts around the injection site and radiates down through her legs. She states taking a hot bath has helped a little, and she will take some ibuprofen today. Reviewed it should improve over the next few days. She asked for a doctor's work note for today which was provided.  Alfonse Spruce, PharmD, CPP, [place] Clinical Pharmacist Practitioner Infectious [place] for [facility]

## 2022-02-11 ENCOUNTER — Encounter (HOSPITAL_COMMUNITY): Payer: Self-pay | Admitting: *Deleted

## 2022-02-11 ENCOUNTER — Ambulatory Visit (HOSPITAL_COMMUNITY)
Admission: EM | Admit: 2022-02-11 | Discharge: 2022-02-11 | Disposition: A | Discharge status: Home / Self Care | Payer: Self-pay | Attending: Family Medicine | Admitting: Family Medicine

## 2022-02-11 DIAGNOSIS — S46812A Strain of other muscles, fascia and tendons at shoulder and upper arm level, left arm, initial encounter: Secondary | ICD-10-CM

## 2022-02-11 DIAGNOSIS — M25512 Pain in left shoulder: Secondary | ICD-10-CM

## 2022-02-11 DIAGNOSIS — K219 Gastro-esophageal reflux disease without esophagitis: Secondary | ICD-10-CM

## 2022-02-11 MED ORDER — TIZANIDINE HCL 4 MG PO TABS: 4.0000 mg | ORAL_TABLET | Freq: Four times a day (QID) | ORAL | 0 refills | Status: DC | PRN

## 2022-02-11 MED ORDER — OMEPRAZOLE 20 MG PO CPDR: 20.0000 mg | DELAYED_RELEASE_CAPSULE | Freq: Every day | ORAL | 0 refills | Status: DC

## 2022-02-11 NOTE — ED Provider Notes (Signed)
[facility]    CSN: [id number] Arrival date & time: 02/11/22  1233      History   Chief Complaint Chief Complaint  Patient presents with   Shoulder Pain    HPI Shenae Bonanno Niziolek is a 51 y.o. female.   Patient is here for left shoulder pain x 3 days.  From the neck all the way to the shoulder;  she is able move the arm, painful with lifting it up.  No injury.  No otc meds taken.  Does not think she can work tonight due to pain.   She does have heart  burn and would like a medication for that.        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/2019   Adenocarcinoma, colon ([place]) 07/20/2019   HIV (human immunodeficiency virus infection) ([place]) 07/14/2018   LGSIL on Pap smear of cervix 04/29/2018   Thrombocytopenia ([place]) 12/12/2017   Routine health maintenance 08/28/2016   H/O menorrhagia 09/05/2014   Iron deficiency anemia 09/05/2014   Sickle-cell trait ([place]) 06/04/2006    Past Surgical History:  Procedure Laterality Date   CESAREAN SECTION     ESOPHAGOGASTRODUODENOSCOPY N/A 02/03/2016   Procedure: ESOPHAGOGASTRODUODENOSCOPY (EGD);  Surgeon: Manus Gunning, MD;  Location: [facility];  Service: Gastroenterology;  Laterality: N/A;   LAPAROSCOPIC APPENDECTOMY N/A 07/14/2019   Procedure: LAPAROSCOPIC APPENDECTOMY CONVERTED TO OPEN RIGHT HEMICOLECTOMY;  Surgeon: Coralie Keens, MD;  Location: [facility];  Service: General;  Laterality: N/A;   RADIOACTIVE SEED GUIDED EXCISIONAL BREAST BIOPSY Right 06/23/2018   Procedure: RADIOACTIVE SEED GUIDED EXCISIONAL RIGHT BREAST BIOPSY;  Surgeon: Stark Klein, MD;  Location: [facility];  Service:  General;  Laterality: Right;    OB History     Gravida  5   Para  4   Term  4   Preterm      AB  1   Living  4      SAB  0   IAB  1   Ectopic      Multiple      Live Births  4            Home Medications    Prior to Admission medications   Medication Sig Start Date End Date Taking? Authorizing Provider  cabotegravir & rilpivirine ER (CABENUVA) 600 & 900 MG/3ML injection Inject 1 kit into the muscle every 2 (two) months. 07/16/21  Yes Kuppelweiser, Cassie L, RPH-CPP  amLODipine (NORVASC) 10 MG tablet Take 1 tablet (10 mg total) by mouth daily. Patient not taking: Reported on 07/23/2021 08/03/20   Ladell Pier, MD  capecitabine (XELODA) 500 MG tablet Take 3 tablets by mouth in AM and 2 tablets in PM. Take with food. Take for 14 days, then hold for 7 days. Repeat every 21 days. Do not start until you see MD Patient not taking: Reported on 07/23/2021 02/08/20   Ladell Pier, MD  ferrous sulfate 220 (44 Fe) MG/5ML solution Take 5 mLs (220 mg total) by mouth 3 (three) times daily with meals. Take at least 2 hours after [facility] Patient not taking: Reported on 07/23/2021  02/07/20   Ladell Pier, MD  gabapentin (NEURONTIN) 300 MG capsule TAKE 1 CAPSULE(300 MG) BY MOUTH THREE TIMES DAILY Patient not taking: Reported on 07/23/2021 11/30/19   [place] Callas, NP  HYDROcodone-acetaminophen (NORCO/VICODIN) 5-325 MG tablet Take 1 tablet by mouth every 6 (six) hours as needed for severe pain or moderate pain. 08/16/21   Eulogio Bear, NP  losartan (COZAAR) 25 MG tablet Take 0.5 tablets (12.5 mg total) by mouth daily. Patient not taking: Reported on 06/28/2020 03/27/20   Ladell Pier, MD  prochlorperazine (COMPAZINE) 10 MG tablet Take 1 tablet (10 mg total) by mouth every 6 (six) hours as needed for nausea. Patient not taking: Reported on 07/23/2021 08/09/19   Ladell Pier, MD  tamsulosin (FLOMAX) 0.4 MG CAPS capsule Take 1 capsule (0.4 mg total) by mouth at  bedtime. 08/16/21   Eulogio Bear, NP  traZODone (DESYREL) 50 MG tablet Take 0.5-1 tablets (25-50 mg total) by mouth at bedtime as needed for sleep. Patient not taking: Reported on 07/23/2021 08/03/20   Ladell Pier, MD    Family History Family History  Problem Relation Age of Onset   Hyperlipidemia Mother    Hypertension Mother    Throat cancer Father    Colon cancer Maternal Aunt    Leukemia Neg Hx    Lymphoma Neg Hx    Esophageal cancer Neg Hx    Stomach cancer Neg Hx    Rectal cancer Neg Hx     Social History Social History   Tobacco Use   Smoking status: Never   Smokeless tobacco: Never  Vaping Use   Vaping Use: Never used  Substance Use Topics   Alcohol use: Yes    Alcohol/week: 1.0 standard drink of alcohol    Types: 1 Cans of beer per week    Comment: occa   Drug use: Not Currently    Types: Cocaine, Marijuana     Allergies   Patient has no known allergies.   Review of Systems Review of Systems  Constitutional: Negative.   HENT: Negative.    Respiratory: Negative.    Cardiovascular: Negative.   Gastrointestinal: Negative.   Genitourinary: Negative.   Musculoskeletal:  Positive for myalgias.  Skin: Negative.   Psychiatric/Behavioral: Negative.       Physical Exam Triage Vital Signs ED Triage Vitals  Enc Vitals Group     BP 02/11/22 1416 132/75     Pulse Rate 02/11/22 1416 83     Resp 02/11/22 1416 18     Temp 02/11/22 1416 98.3 F (36.8 C)     Temp Source 02/11/22 1416 Oral     SpO2 02/11/22 1416 100 %     Weight --      Height --      Head Circumference --      Peak Flow --      Pain Score 02/11/22 1413 10     Pain Loc --      Pain Edu? --      Excl. in [place]? --    No data found.  Updated Vital Signs BP 132/75 (BP Location: Right Arm)   Pulse 83   Temp 98.3 F (36.8 C) (Oral)   Resp 18   LMP 01/16/2022   SpO2 100%   Visual Acuity Right Eye Distance:   Left Eye Distance:   Bilateral Distance:    Right Eye Near:    Left Eye Near:    Bilateral Near:  Physical Exam Constitutional:      Appearance: Normal appearance.  Cardiovascular:     Rate and Rhythm: Normal rate.  Pulmonary:     Effort: Pulmonary effort is normal.  Musculoskeletal:     Comments: No spinous tenderness;  TTP to the left trapezius, from the base of the neck to the shoulder to the shoulder blade;  painful with movement of the shoulder;  normal strength  Skin:    General: Skin is warm.  Neurological:     General: No focal deficit present.     Mental Status: She is alert.  Psychiatric:        Mood and Affect: Mood normal.      UC Treatments / Results  Labs (all labs ordered are listed, but only abnormal results are displayed) Labs Reviewed - No data to display  EKG   Radiology No results found.  Procedures Procedures (including critical care time)  Medications Ordered in UC Medications - No data to display  Initial Impression / Assessment and Plan / UC Course  I have reviewed the triage vital signs and the nursing notes.  Pertinent labs & imaging results that were available during my care of the patient were reviewed by me and considered in my medical decision making (see chart for details).   Final Clinical Impressions(s) / UC Diagnoses   Final diagnoses:  Acute pain of left shoulder  Strain of left trapezius muscle, initial encounter  Gastroesophageal reflux disease, unspecified whether esophagitis present     Discharge Instructions      You were seen today for shoulder/back pain.  I have sent out a muscle relaxer to take.  This may make you sleepy so please take when home and not driving.  You may also take motrin/ibuprofen with food to avoid upset stomach.  I recommend a heating pad as well.  I have sent out a medication to help with your reflux.  Take this daily x 2 weeks.  If you need more medication please discuss with your primary care provider.     ED Prescriptions     Medication Sig  Dispense Auth. Provider   tiZANidine (ZANAFLEX) 4 MG tablet Take 1 tablet (4 mg total) by mouth every 6 (six) hours as needed for muscle spasms. 30 tablet Naleyah Ohlinger, MD   omeprazole (PRILOSEC) 20 MG capsule Take 1 capsule (20 mg total) by mouth daily. 14 capsule Rondel Oh, MD      PDMP not reviewed this encounter.   Rondel Oh, MD 02/11/22 1436

## 2022-02-11 NOTE — Discharge Instructions (Signed)
You were seen today for shoulder/back pain.  I have sent out a muscle relaxer to take.  This may make you sleepy so please take when home and not driving.  You may also take motrin/ibuprofen with food to avoid upset stomach.  I recommend a heating pad as well.  I have sent out a medication to help with your reflux.  Take this daily x 2 weeks.  If you need more medication please discuss with your primary care provider.

## 2022-02-11 NOTE — ED Triage Notes (Signed)
Pt states that she has left shoulder pain x 3 days she doesn't recall hurting it. She is using heat without relief. He states that the pain radiated from her neck to her shoulder.

## 2022-02-20 ENCOUNTER — Telehealth: Payer: Self-pay

## 2022-02-20 NOTE — Telephone Encounter (Signed)
[facility] Patient Advocate Encounter  Patient's medication Kern Reap) have been couriered to [facility] from [facility] [employer] and will be administered on the patient next office visit on 02/27/22.  Ileene Patrick , [facility] Patient [facility] for [facility] Phone: [phone] Fax:  [phone]

## 2022-02-27 ENCOUNTER — Ambulatory Visit (INDEPENDENT_AMBULATORY_CARE_PROVIDER_SITE_OTHER): Payer: Self-pay | Admitting: Infectious Diseases

## 2022-02-27 ENCOUNTER — Other Ambulatory Visit: Payer: Self-pay

## 2022-02-27 ENCOUNTER — Encounter: Payer: Self-pay | Admitting: Infectious Diseases

## 2022-02-27 VITALS — BP 146/78 | HR 88 | Temp 98.1°F | Ht 61.0 in | Wt 93.0 lb

## 2022-02-27 DIAGNOSIS — Z0000 Encounter for general adult medical examination without abnormal findings: Secondary | ICD-10-CM

## 2022-02-27 DIAGNOSIS — Z1231 Encounter for screening mammogram for malignant neoplasm of breast: Secondary | ICD-10-CM

## 2022-02-27 DIAGNOSIS — Z23 Encounter for immunization: Secondary | ICD-10-CM

## 2022-02-27 DIAGNOSIS — K130 Diseases of lips: Secondary | ICD-10-CM

## 2022-02-27 DIAGNOSIS — Z21 Asymptomatic human immunodeficiency virus [HIV] infection status: Secondary | ICD-10-CM

## 2022-02-27 MED ORDER — FLUCONAZOLE 200 MG PO TABS: 200.0000 mg | ORAL_TABLET | Freq: Every day | ORAL | 0 refills | Status: DC

## 2022-02-27 MED ORDER — CABOTEGRAVIR & RILPIVIRINE ER 600 & 900 MG/3ML IM SUER
1.0000 | Freq: Once | INTRAMUSCULAR | Status: AC
  Administered 2022-02-27: 1 via INTRAMUSCULAR

## 2022-02-27 NOTE — Patient Instructions (Signed)
Nice to see you!  Happy Birthday!  Please schedule a follow up cabenuva injection around January 11th with either our pharmacy team or me.   Happy Holidays too!

## 2022-02-28 NOTE — Assessment & Plan Note (Signed)
She does have some white plaques though just in the corners of the mouth.  We will treat with a conservative course of Diflucan.  May primarily be an atopic process.

## 2022-02-28 NOTE — Progress Notes (Signed)
Name: Shirley White  DOB: 06-22-1970 MRN: [id number] PCP: Ladell Pier, MD    Brief Narrative:  Shirley White is a 51 y.o. woman with HIV disease originally diagnosed in 2005. Poor adherence with AIDS qualifying CD4s since 2016 with CD4 nadir 30.  HIV Risk: heterosexual.  History of OIs: esophageal candidiasis.   Previous Regimens: Complera 2011 >> difficulty swallowing regimen Emtriva + Edurant + Viread 2011 through 01-2014 [place] 2016 [place] + [place] 2018  Biktarvy 2019 Cabenuva 07-2021  Genotypes: 08-2014 - sensitive 11-2016 - sensitive    Subjective:   Chief Complaint  Patient presents with   Follow-up     HPI: Shirley White is here for routine follow-up care for HIV treatment.  She has done well since transitioning to [place] injections every 2 months.  The pain associated with the shots has decreased significantly to where she is not affected after the injection any longer.  She would like to continue mode of treatment.  She is excited to tell me today that she is got a fianc.  She tells me she needs a mammogram for general screening purposes.  She worries she may have some thrush that has been affecting the sides of her cheeks and corners of her mouth.  This is not particularly painful.    Review of Systems  Constitutional:  Negative for appetite change, chills, fatigue, fever and unexpected weight change.  HENT:  Negative for mouth sores, sore throat and trouble swallowing.   Eyes:  Negative for pain and visual disturbance.  Respiratory:  Negative for cough and shortness of breath.   Cardiovascular:  Negative for chest pain.  Gastrointestinal:  Negative for abdominal pain, diarrhea and nausea.  Genitourinary:  Negative for dysuria, menstrual problem and pelvic pain.  Musculoskeletal:  Negative for back pain and neck pain.  Skin:  Negative for color change and rash.  Neurological:  Negative for weakness, numbness and headaches.  Hematological:   Negative for adenopathy.  Psychiatric/Behavioral:  Negative for dysphoric mood. The patient is not nervous/anxious.      Past Medical History:  Diagnosis Date   Anemia    GERD (gastroesophageal reflux disease)    no meds   Heart murmur    HIV disease ([place])     Outpatient Medications Prior to Visit  Medication Sig Dispense Refill   cabotegravir & rilpivirine ER (CABENUVA) 600 & 900 MG/3ML injection Inject 1 kit into the muscle every 2 (two) months. 6 mL 5   omeprazole (PRILOSEC) 20 MG capsule Take 1 capsule (20 mg total) by mouth daily. 14 capsule 0   tiZANidine (ZANAFLEX) 4 MG tablet Take 1 tablet (4 mg total) by mouth every 6 (six) hours as needed for muscle spasms. 30 tablet 0   No facility-administered medications prior to visit.     No Known Allergies  Social History   Tobacco Use   Smoking status: Never   Smokeless tobacco: Never  Vaping Use   Vaping Use: Never used  Substance Use Topics   Alcohol use: Yes    Alcohol/week: 1.0 standard drink of alcohol    Types: 1 Cans of beer per week    Comment: occa   Drug use: Not Currently    Types: Cocaine, Marijuana    Social History   Substance and Sexual Activity  Sexual Activity Not Currently   Partners: Male   Birth control/protection: Condom   Comment: DECLINED CONDOMS 06/2020     Objective:   Vitals:   02/27/22 1606  BP: (!) 146/78  Pulse: 88  Temp: 98.1 F (36.7 C)  TempSrc: Temporal  SpO2: 100%  Weight: 93 lb (42.2 kg)  Height: _0  (1.549 m)   Body mass index is 17.57 kg/m.   Physical Exam Constitutional:      Appearance: Normal appearance. She is not ill-appearing.  HENT:     Mouth/Throat:     Mouth: Mucous membranes are moist.     Pharynx: Oropharynx is clear.     Comments: On the corners of her mouth she does have some white built up discharge.  There is no significant erythema.  Her tongue is clear buccal mucosa is clear. Eyes:     General: No scleral icterus. Cardiovascular:      Rate and Rhythm: Normal rate.  Pulmonary:     Effort: Pulmonary effort is normal.  Neurological:     Mental Status: She is oriented to person, place, and time.  Psychiatric:        Mood and Affect: Mood normal.        Thought Content: Thought content normal.      Lab Results Lab Results  Component Value Date   WBC 7.9 12/20/2020   HGB 6.9 (L) 12/20/2020   HCT 24.9 (L) 12/20/2020   MCV 64.8 (L) 12/20/2020   PLT 429 (H) 12/20/2020    Lab Results  Component Value Date   CREATININE 0.52 10/23/2021   BUN 8 10/23/2021   NA 135 10/23/2021   K 3.7 10/23/2021   CL 102 10/23/2021   CO2 24 10/23/2021    Lab Results  Component Value Date   ALT 7 10/23/2021   AST 9 (L) 10/23/2021   ALKPHOS 50 08/10/2020   BILITOT 0.6 10/23/2021    Lab Results  Component Value Date   CHOL 193 03/01/2020   HDL 66 03/01/2020   LDLCALC 110 (H) 03/01/2020   TRIG 81 03/01/2020   CHOLHDL 2.9 03/01/2020   HIV 1 RNA Quant (Copies/mL)  Date Value  12/25/2021 Not Detected  10/23/2021 Not Detected  12/20/2020 Not Detected   CD4 T Cell Abs (/uL)  Date Value  12/20/2020 462  03/01/2020 338 (L)  10/12/2019 328 (L)     Assessment & Plan:   Problem List Items Addressed This Visit       High   HIV (human immunodeficiency virus infection) ([place]) (Chronic)    Very well controlled on every 67-monthCabenuva injections. No concerns with access or adherence to medication. They are tolerating the medication well without significant side effects. No drug interactions identified.  We can space out labs to every other visit.  Next time we will plan to recheck her CD4 count including viral load.   Reminded about ADAP re-enrollment in July / January.  No dental needs today.  No concern over anxious/depressed mood.  Sexual health and family planning discussed -no needs identified.  Vaccines updated today - see health maintenance section.   04/29/2022 is her next scheduled maintenance injection         Relevant Medications   fluconazole (DIFLUCAN) 200 MG tablet     Unprioritized   Cheilitis    She does have some white plaques though just in the corners of the mouth.  We will treat with a conservative course of Diflucan.  May primarily be an atopic process.      Routine health maintenance    Flu vaccine provided today. We will place a referral for [facility] clinic to help with screening mammogram  Her Pap smear is currently up-to-date.      Other Visit Diagnoses     Need for immunization against influenza    -  Primary   Relevant Orders   Flu Vaccine QUAD 61moIM (Fluarix, Fluzone & Alfiuria Quad PF) (Completed)   Screening mammogram for breast cancer       Relevant Orders   Ambulatory referral to Gynecology      SJanene Madeira MSN, NP-C [facility] Springsfor IPresidioPager: [phone]: [phone] 02/28/22  12:25 PM

## 2022-02-28 NOTE — Assessment & Plan Note (Signed)
Flu vaccine provided today. We will place a referral for [facility] clinic to help with screening mammogram  Her Pap smear is currently up-to-date.

## 2022-02-28 NOTE — Assessment & Plan Note (Signed)
Very well controlled on every 38-monthCabenuva injections. No concerns with access or adherence to medication. They are tolerating the medication well without significant side effects. No drug interactions identified.  We can space out labs to every other visit.  Next time we will plan to recheck her CD4 count including viral load.   Reminded about ADAP re-enrollment in July / January.  No dental needs today.  No concern over anxious/depressed mood.  Sexual health and family planning discussed -no needs identified.  Vaccines updated today - see health maintenance section.   04/29/2022 is her next scheduled maintenance injection

## 2022-04-23 ENCOUNTER — Telehealth: Payer: Self-pay

## 2022-04-23 NOTE — Telephone Encounter (Signed)
[facility] Patient Advocate Encounter  Patient's medications Kern Reap) have been couriered to [facility] from [facility]: [phone], and will be administered on  04/29/2022

## 2022-04-29 ENCOUNTER — Ambulatory Visit (INDEPENDENT_AMBULATORY_CARE_PROVIDER_SITE_OTHER): Payer: Self-pay | Admitting: Pharmacist

## 2022-04-29 ENCOUNTER — Other Ambulatory Visit: Payer: Self-pay

## 2022-04-29 DIAGNOSIS — Z21 Asymptomatic human immunodeficiency virus [HIV] infection status: Secondary | ICD-10-CM

## 2022-04-29 MED ORDER — CABOTEGRAVIR & RILPIVIRINE ER 600 & 900 MG/3ML IM SUER
1.0000 | Freq: Once | INTRAMUSCULAR | Status: AC
  Administered 2022-04-29: 1 via INTRAMUSCULAR

## 2022-04-29 NOTE — Progress Notes (Signed)
HPI: Shirley White is a 52 y.o. female who presents to the [place] clinic for [place] administration.  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   FLUCONAZOLE (DIFLUCAN) 200 MG TABLET    Take 1 tablet (200 mg total) by mouth daily.   OMEPRAZOLE (PRILOSEC) 20 MG CAPSULE    Take 1 capsule (20 mg total) by mouth daily.   TIZANIDINE (ZANAFLEX) 4 MG TABLET    Take 1 tablet (4 mg total) by mouth every 6 (six) hours as needed for muscle spasms.  Modified Medications   No medications on file  Discontinued Medications   No medications on file    Allergies: No Known Allergies  Past Medical History: Past Medical History:  Diagnosis Date   Anemia    GERD (gastroesophageal reflux disease)    no meds   Heart murmur    HIV disease (H[facility])     Social History: Social History   Socioeconomic History   Marital status: Single    Spouse name: Not on file   Number of children: 4   Years of education: Not on file   Highest education level: 12th grade  Occupational History   Occupation: Scientist, water quality at [employer]  Tobacco Use   Smoking status: Never   Smokeless tobacco: Never  Vaping Use   Vaping Use: Never used  Substance and Sexual Activity   Alcohol use: Yes    Alcohol/week: 1.0  standard drink of alcohol    Types: 1 Cans of beer per week    Comment: occa   Drug use: Not Currently    Types: Cocaine, Marijuana   Sexual activity: Not Currently    Partners: Male    Birth control/protection: Condom    Comment: DECLINED CONDOMS 06/2020  Other Topics Concern   Not on file  Social History Narrative   Patient lives alone   Patient has to take public transportation   Social Determinants of Health   Financial Resource Strain: Not on file  Food Insecurity: Food Insecurity Present (04/29/2018)   Hunger Vital Sign    Worried About Running Out of Food in the Last Year: Never true    Ran Out of Food in the Last Year: Often true  Transportation Needs: Unmet Transportation Needs (04/01/2018)   PRAPARE - Hydrologist (Medical): Yes    Lack of Transportation (Non-Medical): Yes  Physical Activity: Not on file  Stress: Not on file  Social Connections: Not on file    Labs: Lab Results  Component Value Date   HIV1RNAQUANT Not Detected 12/25/2021   HIV1RNAQUANT Not Detected 10/23/2021   HIV1RNAQUANT Not Detected 12/20/2020   CD4TABS 462 12/20/2020   CD4TABS 338 (L) 03/01/2020   CD4TABS 328 (L) 10/12/2019    RPR and STI Lab  Results  Component Value Date   LABRPR NON-REACTIVE 12/20/2020   LABRPR NON-REACTIVE 06/22/2018   LABRPR NON REAC 08/21/2015   LABRPR NON REAC 09/05/2014   LABRPR NON REAC 09/24/2009    STI Results GC CT  04/29/2018 12:00 AM Negative  Negative   03/22/2018 12:00 AM Negative  Negative   08/21/2015 12:00 AM Negative  Negative   09/05/2014 12:00 AM Negative  Negative     Hepatitis B Lab Results  Component Value Date   HEPBSAB NEG 09/05/2014   HEPBSAG NEGATIVE 09/05/2014   HEPBCAB NON REACTIVE 09/05/2014   Hepatitis C No results found for: "HEPCAB", "HCVRNAPCRQN" Hepatitis A Lab Results  Component Value Date   HAV REACTIVE (A) 09/05/2014   Lipids: Lab Results  Component Value Date   CHOL 193  03/01/2020   TRIG 81 03/01/2020   HDL 66 03/01/2020   CHOLHDL 2.9 03/01/2020   VLDL 12 08/21/2015   LDLCALC 110 (H) 03/01/2020    TARGET DATE: 11th of each month   Assessment: Shirley White presents today for her maintenance Cabenuva injections. Past injections were tolerated well without issues.No symptoms of an ST reported. She declined STI screening and condoms during the visit.   Administered cabotegravir '600mg'$ /68m in left upper outer quadrant of the gluteal muscle. Administered rilpivirine 900 mg/368min the right upper outer quadrant of the gluteal muscle. No issues with injections. She will follow up in 2 months for next set of injections.  Patient was eligible for the Tdap booster, Heplisav series, COVID booster, and Shingrix series. After discussion of the risks and benefits, patient wished to defer all vaccines until a future visit.   Shirley Marylandsked for the pharmacy team to obtain CD4 and viral load during today's visit. Shirley White would like the HIV RNA and CD4 obtained every other visit.  It has also been over a year since the CBC with Diff was obtained, so this lab was obtained as well.   Plan: - Cabenuva injections administered - F/U HIV RNA, CD4 , CBC with Diff - Next injections scheduled for 05/18/22 with Shirley Marylandnd 08/21/22 with Shirley White- Call with any issues or questions  AuAdria DillPharmD PGY-2 Infectious Diseases Resident  04/29/2022 4:04 PM

## 2022-04-30 LAB — T-HELPER CELL (CD4) - (RCID CLINIC ONLY)
CD4 % Helper T Cell: 27 % — ABNORMAL LOW (ref 33–65)
CD4 T Cell Abs: 554 /uL (ref 400–1790)

## 2022-04-30 NOTE — Progress Notes (Signed)
Unfortunatley her hemaglobin is too low to arrange outpatient transfusion and will need to refer her to ER for this. I do think it would be best going forward to get her back with hematology for management of her severe anemia. She seemed to do much better with regular care with them.  I will also talk wit her about referral to GYN for management of fibroids and heavy menses to address primary cause of bleeding. Hysterectomy vs medical management with IUD vs uterine artery oblation maybe?

## 2022-04-30 NOTE — Progress Notes (Signed)
Hemoglobin down to 5.9 yesterday; please advise.

## 2022-04-30 NOTE — Progress Notes (Signed)
Spoke with patient over the phone today regarding low hemoglobin. She states now is not a good time to talk because she is at work and states her hemoglobin is always low. I emphasized her numbers are lower than hers normally are and that this needs to be addressed quickly. Recommended she go to the ED or call her PCP for other recommendations though they will likely also advise her to go to the ED. She verbalized understanding and had no further questions. Discussed that Colletta Maryland will review this further with her at her next office visit. Estill Bamberg

## 2022-04-30 NOTE — Progress Notes (Signed)
Ned Card NP from oncology sent me a staff message this morning:  We received the labs recently completed on Shirley White. We have not seen her in a little over 2 years. She declined follow-up with [place] regarding the anemia in September 2022. You may want to contact her PCP. Please let me know if there is anything we can do to help.  You're welcome and we are happy to see her at the [place] location if she would like. If she prefers the [facility] office let me know and I can help to facilitate getting her in.

## 2022-05-01 ENCOUNTER — Telehealth: Payer: Self-pay

## 2022-05-01 LAB — HIV-1 RNA QUANT-NO REFLEX-BLD
HIV 1 RNA Quant: NOT DETECTED Copies/mL
HIV-1 RNA Quant, Log: NOT DETECTED Log cps/mL

## 2022-05-01 LAB — CBC WITH DIFFERENTIAL/PLATELET
Absolute Monocytes: 746 cells/uL (ref 200–950)
Basophils Absolute: 99 cells/uL (ref 0–200)
Basophils Relative: 1.5 %
Eosinophils Absolute: 152 cells/uL (ref 15–500)
Eosinophils Relative: 2.3 %
HCT: 21.9 % — ABNORMAL LOW (ref 35.0–45.0)
Hemoglobin: 5.9 g/dL — CL (ref 11.7–15.5)
Lymphs Abs: 2020 cells/uL (ref 850–3900)
MCH: 15.3 pg — ABNORMAL LOW (ref 27.0–33.0)
MCHC: 26.9 g/dL — ABNORMAL LOW (ref 32.0–36.0)
MCV: 56.9 fL — ABNORMAL LOW (ref 80.0–100.0)
Monocytes Relative: 11.3 %
Neutro Abs: 3584 cells/uL (ref 1500–7800)
Neutrophils Relative %: 54.3 %
Platelets: 460 10*3/uL — ABNORMAL HIGH (ref 140–400)
RBC: 3.85 10*6/uL (ref 3.80–5.10)
RDW: 23.7 % — ABNORMAL HIGH (ref 11.0–15.0)
Total Lymphocyte: 30.6 %
WBC: 6.6 10*3/uL (ref 3.8–10.8)

## 2022-05-01 LAB — CBC MORPHOLOGY

## 2022-05-01 NOTE — Telephone Encounter (Signed)
Patient called, says she was told to go to the hospital yesterday for "low iron."  Reviewed her labs with her and discussed that recommendation for ED was based on her low hemoglobin. She states she does not want to go to the ED and would like to know what her other options are.   Relayed that hemoglobin as low as hers needs to be addressed quickly and would likely require a blood transfusion that would need to be done at the hospital. She is adamant about not wanting to go to the ED.   Encouraged her to call her PCP to discuss further, but that they would also likely recommend ED evaluation.   Patient disconnected the call.   Beryle Flock, RN

## 2022-05-01 NOTE — Telephone Encounter (Signed)
Thanks for the update. I hope she is willing to have this addressed soon before anything else worsens. Estill Bamberg

## 2022-06-10 ENCOUNTER — Telehealth: Payer: Self-pay | Admitting: Pharmacist

## 2022-06-10 NOTE — Telephone Encounter (Signed)
Patient's medication Kern Reap) has been delivered to [facility] from [facility] and will be administered at patient's next office visit on 06/16/22.  Rozanna Cormany L. Janiyah Beery, PharmD [facility] Clinical Pharmacist Practitioner

## 2022-06-16 ENCOUNTER — Other Ambulatory Visit: Payer: Self-pay

## 2022-06-16 ENCOUNTER — Encounter: Payer: Self-pay | Admitting: Infectious Diseases

## 2022-06-16 ENCOUNTER — Ambulatory Visit (INDEPENDENT_AMBULATORY_CARE_PROVIDER_SITE_OTHER): Payer: BLUE CROSS/BLUE SHIELD | Admitting: Infectious Diseases

## 2022-06-16 VITALS — BP 154/82 | HR 81 | Temp 99.8°F | Ht <= 58 in | Wt 94.0 lb

## 2022-06-16 DIAGNOSIS — Z21 Asymptomatic human immunodeficiency virus [HIV] infection status: Secondary | ICD-10-CM

## 2022-06-16 DIAGNOSIS — D500 Iron deficiency anemia secondary to blood loss (chronic): Secondary | ICD-10-CM | POA: Diagnosis not present

## 2022-06-16 DIAGNOSIS — Z1231 Encounter for screening mammogram for malignant neoplasm of breast: Secondary | ICD-10-CM

## 2022-06-16 DIAGNOSIS — I10 Essential (primary) hypertension: Secondary | ICD-10-CM | POA: Diagnosis not present

## 2022-06-16 DIAGNOSIS — N643 Galactorrhea not associated with childbirth: Secondary | ICD-10-CM | POA: Diagnosis not present

## 2022-06-16 MED ORDER — CABOTEGRAVIR & RILPIVIRINE ER 600 & 900 MG/3ML IM SUER
1.0000 | Freq: Once | INTRAMUSCULAR | Status: AC
  Administered 2022-06-16: 1 via INTRAMUSCULAR

## 2022-06-16 NOTE — Progress Notes (Incomplete)
Name: Shirley White  DOB: 1970-07-05 MRN: [id number] PCP: Ladell Pier, MD    Brief Narrative:  Shirley White is a 52 y.o. woman with HIV disease originally diagnosed in 2005. Poor adherence with AIDS qualifying CD4s since 2016 with CD4 nadir 30.  HIV Risk: heterosexual.  History of OIs: esophageal candidiasis.   Previous Regimens: Complera 2011 >> difficulty swallowing regimen Emtriva + Edurant + Viread 2011 through 01-2014 [facility] 2016 [place] 2018  Biktarvy 2019 Cabenuva 07-2021  Genotypes: 08-2014 - sensitive 11-2016 - sensitive    Subjective:   No chief complaint on file.    HPI: Shirley White is here for routine follow-up care for HIV treatment.  Shirley White has done well since transitioning to [place] injections every 2 months.    Had severe anemia in January where hgb dropped down to < 6.  Both breasts have started with clear/milky spontaneous leakage over the last 3-4 months now.    Review of Systems  Constitutional:  Negative for appetite change, chills, fatigue, fever and unexpected weight change.  HENT:  Negative for mouth sores, sore throat and trouble swallowing.   Eyes:  Negative for pain and visual disturbance.  Respiratory:  Negative for cough and shortness of breath.   Cardiovascular:  Negative for chest pain.  Gastrointestinal:  Negative for abdominal pain, diarrhea and nausea.  Genitourinary:  Negative for dysuria, menstrual problem and pelvic pain.  Musculoskeletal:  Negative for back pain and neck pain.  Skin:  Negative for color change and rash.  Neurological:  Negative for weakness, numbness and headaches.  Hematological:  Negative for adenopathy.  Psychiatric/Behavioral:  Negative for dysphoric mood. The patient is not nervous/anxious.      Past Medical History:  Diagnosis Date  . Anemia   . GERD (gastroesophageal reflux disease)    no meds  . Heart murmur   . HIV disease [facility])     Outpatient Medications Prior to Visit   Medication Sig Dispense Refill  . cabotegravir & rilpivirine ER (CABENUVA) 600 & 900 MG/3ML injection Inject 1 kit into the muscle every 2 (two) months. 6 mL 5  . fluconazole (DIFLUCAN) 200 MG tablet Take 1 tablet (200 mg total) by mouth daily. 7 tablet 0  . omeprazole (PRILOSEC) 20 MG capsule Take 1 capsule (20 mg total) by mouth daily. 14 capsule 0  . tiZANidine (ZANAFLEX) 4 MG tablet Take 1 tablet (4 mg total) by mouth every 6 (six) hours as needed for muscle spasms. 30 tablet 0   No facility-administered medications prior to visit.     No Known Allergies  Social History   Tobacco Use  . Smoking status: Never  . Smokeless tobacco: Never  Vaping Use  . Vaping Use: Never used  Substance Use Topics  . Alcohol use: Yes    Alcohol/week: 1.0 standard drink of alcohol    Types: 1 Cans of beer per week    Comment: occa  . Drug use: Not Currently    Types: Cocaine, Marijuana    Social History   Substance and Sexual Activity  Sexual Activity Not Currently  . Partners: Male  . Birth control/protection: Condom   Comment: DECLINED CONDOMS 06/2020     Objective:   There were no vitals filed for this visit.  There is no height or weight on file to calculate BMI.   Physical Exam Constitutional:      Appearance: Normal appearance. Shirley White is not ill-appearing.  HENT:     Mouth/Throat:  Mouth: Mucous membranes are moist.     Pharynx: Oropharynx is clear.     Comments: On the corners of her mouth Shirley White does have some white built up discharge.  There is no significant erythema.  Her tongue is clear buccal mucosa is clear. Eyes:     General: No scleral icterus. Cardiovascular:     Rate and Rhythm: Normal rate.  Pulmonary:     Effort: Pulmonary effort is normal.  Neurological:     Mental Status: Shirley White is oriented to person, place, and time.  Psychiatric:        Mood and Affect: Mood normal.        Thought Content: Thought content normal.     Lab Results Lab Results   Component Value Date   WBC 6.6 04/29/2022   HGB 5.9 (LL) 04/29/2022   HCT 21.9 (L) 04/29/2022   MCV 56.9 (L) 04/29/2022   PLT 460 (H) 04/29/2022    Lab Results  Component Value Date   CREATININE 0.52 10/23/2021   BUN 8 10/23/2021   NA 135 10/23/2021   K 3.7 10/23/2021   CL 102 10/23/2021   CO2 24 10/23/2021    Lab Results  Component Value Date   ALT 7 10/23/2021   AST 9 (L) 10/23/2021   ALKPHOS 50 08/10/2020   BILITOT 0.6 10/23/2021    Lab Results  Component Value Date   CHOL 193 03/01/2020   HDL 66 03/01/2020   LDLCALC 110 (H) 03/01/2020   TRIG 81 03/01/2020   CHOLHDL 2.9 03/01/2020   HIV 1 RNA Quant (Copies/mL)  Date Value  04/29/2022 Not Detected  12/25/2021 Not Detected  10/23/2021 Not Detected   CD4 T Cell Abs (/uL)  Date Value  04/29/2022 554  12/20/2020 462  03/01/2020 338 (L)     Assessment & Plan:   Problem List Items Addressed This Visit       High   HIV (human immunodeficiency virus infection) ([place]) - Primary (Chronic)   Relevant Orders   CBC   HIV 1 RNA quant-no reflex-bld   Janene Madeira, MSN, NP-C [facility] [place] Pager: [phone] Office: [phone]  06/16/22  4:07 PM

## 2022-06-16 NOTE — Patient Instructions (Addendum)
Start the iron pills again twice a day. Take with orange juice to help it absorb better.   Stool softeners as well to help - can mix the miralax with the iron.   08/21/2022 is your next office visit for an injection of medication.   I would like to see you back for your July appointment to follow up on your anemia and see how the iron is doing.

## 2022-06-17 ENCOUNTER — Other Ambulatory Visit: Payer: Self-pay

## 2022-06-17 ENCOUNTER — Emergency Department (HOSPITAL_COMMUNITY)
Admission: EM | Admit: 2022-06-17 | Discharge: 2022-06-17 | Disposition: A | Discharge status: Home / Self Care | Payer: BLUE CROSS/BLUE SHIELD | Attending: Emergency Medicine | Admitting: Emergency Medicine

## 2022-06-17 ENCOUNTER — Encounter (HOSPITAL_COMMUNITY): Payer: Self-pay

## 2022-06-17 ENCOUNTER — Telehealth: Payer: Self-pay

## 2022-06-17 DIAGNOSIS — D649 Anemia, unspecified: Secondary | ICD-10-CM | POA: Diagnosis not present

## 2022-06-17 DIAGNOSIS — Z21 Asymptomatic human immunodeficiency virus [HIV] infection status: Secondary | ICD-10-CM | POA: Diagnosis not present

## 2022-06-17 DIAGNOSIS — N3000 Acute cystitis without hematuria: Secondary | ICD-10-CM | POA: Insufficient documentation

## 2022-06-17 DIAGNOSIS — R7989 Other specified abnormal findings of blood chemistry: Secondary | ICD-10-CM | POA: Diagnosis present

## 2022-06-17 LAB — URINALYSIS, ROUTINE W REFLEX MICROSCOPIC
Bilirubin Urine: NEGATIVE
Glucose, UA: NEGATIVE mg/dL
Hgb urine dipstick: NEGATIVE
Ketones, ur: NEGATIVE mg/dL
Nitrite: NEGATIVE
Protein, ur: 30 mg/dL — AB
Specific Gravity, Urine: 1.011 (ref 1.005–1.030)
pH: 5 (ref 5.0–8.0)

## 2022-06-17 LAB — RETICULOCYTES
Immature Retic Fract: 19.4 % — ABNORMAL HIGH (ref 2.3–15.9)
RBC.: 3.62 MIL/uL — ABNORMAL LOW (ref 3.87–5.11)
Retic Count, Absolute: 28.6 10*3/uL (ref 19.0–186.0)
Retic Ct Pct: 0.8 % (ref 0.4–3.1)

## 2022-06-17 LAB — COMPREHENSIVE METABOLIC PANEL
ALT: 11 U/L (ref 0–44)
AST: 28 U/L (ref 15–41)
Albumin: 4.2 g/dL (ref 3.5–5.0)
Alkaline Phosphatase: 39 U/L (ref 38–126)
Anion gap: 9 (ref 5–15)
BUN: 6 mg/dL (ref 6–20)
CO2: 22 mmol/L (ref 22–32)
Calcium: 8.9 mg/dL (ref 8.9–10.3)
Chloride: 105 mmol/L (ref 98–111)
Creatinine, Ser: 0.5 mg/dL (ref 0.44–1.00)
GFR, Estimated: >60 mL/min (ref >60)
Glucose, Bld: 86 mg/dL (ref 70–99)
Potassium: 4.1 mmol/L (ref 3.5–5.1)
Sodium: 136 mmol/L (ref 135–145)
Total Bilirubin: 1.1 mg/dL (ref 0.3–1.2)
Total Protein: 8.3 g/dL — ABNORMAL HIGH (ref 6.5–8.1)

## 2022-06-17 LAB — CBC WITH DIFFERENTIAL/PLATELET
Abs Immature Granulocytes: 0.03 10*3/uL (ref 0.00–0.07)
Basophils Absolute: 0.1 10*3/uL (ref 0.0–0.1)
Basophils Relative: 1 %
Eosinophils Absolute: 0.1 10*3/uL (ref 0.0–0.5)
Eosinophils Relative: 1 %
HCT: 20.3 % — ABNORMAL LOW (ref 36.0–46.0)
Hemoglobin: 5.4 g/dL — CL (ref 12.0–15.0)
Immature Granulocytes: 0 %
Lymphocytes Relative: 22 %
Lymphs Abs: 2.1 10*3/uL (ref 0.7–4.0)
MCH: 14.8 pg — ABNORMAL LOW (ref 26.0–34.0)
MCHC: 26.6 g/dL — ABNORMAL LOW (ref 30.0–36.0)
MCV: 55.8 fL — ABNORMAL LOW (ref 80.0–100.0)
Monocytes Absolute: 1 10*3/uL (ref 0.1–1.0)
Monocytes Relative: 10 %
Neutro Abs: 6.1 10*3/uL (ref 1.7–7.7)
Neutrophils Relative %: 66 %
Platelets: 558 10*3/uL — ABNORMAL HIGH (ref 150–400)
RBC: 3.64 MIL/uL — ABNORMAL LOW (ref 3.87–5.11)
RDW: 22.6 % — ABNORMAL HIGH (ref 11.5–15.5)
WBC: 9.4 10*3/uL (ref 4.0–10.5)
nRBC: 0.5 % — ABNORMAL HIGH (ref 0.0–0.2)

## 2022-06-17 LAB — IRON AND TIBC
Iron: 57 ug/dL (ref 28–170)
Saturation Ratios: 13 % (ref 10.4–31.8)
TIBC: 452 ug/dL — ABNORMAL HIGH (ref 250–450)
UIBC: 395 ug/dL

## 2022-06-17 LAB — CBC
HCT: 19.5 % — ABNORMAL LOW (ref 35.0–45.0)
Hemoglobin: 5 g/dL — CL (ref 11.7–15.5)
MCH: <15 pg — ABNORMAL LOW (ref 27.0–33.0)
MCHC: 25.6 g/dL — ABNORMAL LOW (ref 32.0–36.0)
MCV: 55.1 fL — ABNORMAL LOW (ref 80.0–100.0)
MPV: 9.8 fL (ref 7.5–12.5)
Platelets: 563 10*3/uL — ABNORMAL HIGH (ref 140–400)
RBC: 3.54 10*6/uL — ABNORMAL LOW (ref 3.80–5.10)
RDW: 22 % — ABNORMAL HIGH (ref 11.0–15.0)
WBC: 6.1 10*3/uL (ref 3.8–10.8)

## 2022-06-17 LAB — PREPARE RBC (CROSSMATCH)

## 2022-06-17 LAB — FERRITIN: Ferritin: 1 ng/mL — ABNORMAL LOW (ref 11–307)

## 2022-06-17 LAB — FOLATE: Folate: 13 ng/mL (ref >5.9)

## 2022-06-17 LAB — VITAMIN B12: Vitamin B-12: 313 pg/mL (ref 180–914)

## 2022-06-17 LAB — POC OCCULT BLOOD, ED: Fecal Occult Bld: NEGATIVE

## 2022-06-17 LAB — PREGNANCY, URINE: Preg Test, Ur: NEGATIVE

## 2022-06-17 LAB — PROLACTIN: Prolactin: 9.1 ng/mL

## 2022-06-17 MED ORDER — CEPHALEXIN 500 MG PO CAPS: 500.0000 mg | ORAL_CAPSULE | Freq: Three times a day (TID) | ORAL | 0 refills | Status: DC

## 2022-06-17 MED ORDER — CEPHALEXIN 500 MG PO CAPS
500.0000 mg | ORAL_CAPSULE | Freq: Once | ORAL | Status: AC
  Administered 2022-06-17: 500 mg via ORAL
  Filled 2022-06-17: qty 1

## 2022-06-17 MED ORDER — FERROUS SULFATE 325 (65 FE) MG PO TABS: 325.0000 mg | ORAL_TABLET | Freq: Every day | ORAL | 0 refills | Status: DC

## 2022-06-17 MED ORDER — SODIUM CHLORIDE 0.9% IV SOLUTION: Freq: Once | INTRAVENOUS | Status: AC

## 2022-06-17 NOTE — ED Provider Notes (Signed)
[facility] Provider Note   CSN: [id number] Arrival date & time: 06/17/22  1134     History  Chief Complaint  Patient presents with   Abnormal Lab    Shirley White is a 52 y.o. female.  Pt is a 52 yo female with pmhx significant for anemia, HIV, and GERD.  Pt was sent from ID for a blood transfusion.  She said she feels weak, but ok.  She has had to have transfusions in the past.  Pt reports heavy periods.  Period ended on 2/27.  She has not been to obgyn in years.  She has been referred to GI in the past, but has not gone (She said she did not know she was referred to GI).  She did go to ID yesterday.  They did blood work which showed a hgb of 5.0 and recommended that she come to the ED.  Hgb 5.9 1 month ago.  Pt denies any active bleeding.       Home Medications Prior to Admission medications   Medication Sig Start Date End Date Taking? Authorizing Provider  cephALEXin (KEFLEX) 500 MG capsule Take 1 capsule (500 mg total) by mouth 3 (three) times daily. 06/17/22  Yes Isla Pence, MD  ferrous sulfate 325 (65 FE) MG tablet Take 1 tablet (325 mg total) by mouth daily. 06/17/22  Yes Isla Pence, MD  cabotegravir & rilpivirine ER (CABENUVA) 600 & 900 MG/3ML injection Inject 1 kit into the muscle every 2 (two) months. 07/16/21   Kuppelweiser, Cassie L, RPH-CPP  fluconazole (DIFLUCAN) 200 MG tablet Take 1 tablet (200 mg total) by mouth daily. Patient not taking: Reported on 06/16/2022 02/27/22    Callas, NP  omeprazole (PRILOSEC) 20 MG capsule Take 1 capsule (20 mg total) by mouth daily. Patient not taking: Reported on 06/16/2022 02/11/22   Rondel Oh, MD  tiZANidine (ZANAFLEX) 4 MG tablet Take 1 tablet (4 mg total) by mouth every 6 (six) hours as needed for muscle spasms. Patient not taking: Reported on 06/16/2022 02/11/22   Rondel Oh, MD      Allergies    Patient has no known allergies.    Review of Systems   Review  of Systems  Neurological:  Positive for weakness.  All other systems reviewed and are negative.   Physical Exam Updated Vital Signs BP (!) 106/57   Pulse 81   Temp 98.5 F (36.9 C) (Oral)   Resp 14   Ht '4\' 9"'$  (1.448 m)   Wt 42.6 kg   LMP 06/05/2022   SpO2 100%   BMI 20.34 kg/m  Physical Exam Vitals and nursing note reviewed.  Constitutional:      Appearance: Normal appearance.  HENT:     Head: Normocephalic and atraumatic.     Right Ear: External ear normal.     Left Ear: External ear normal.     Nose: Nose normal.     Mouth/Throat:     Mouth: Mucous membranes are moist.     Pharynx: Oropharynx is clear.  Eyes:     Extraocular Movements: Extraocular movements intact.     Conjunctiva/sclera: Conjunctivae normal.     Pupils: Pupils are equal, round, and reactive to light.  Cardiovascular:     Rate and Rhythm: Normal rate and regular rhythm.     Pulses: Normal pulses.     Heart sounds: Normal heart sounds.  Pulmonary:     Effort: Pulmonary effort is normal.  Breath sounds: Normal breath sounds.  Abdominal:     General: Abdomen is flat. Bowel sounds are normal.     Palpations: Abdomen is soft.  Genitourinary:    Rectum: Guaiac result negative.  Musculoskeletal:        General: Normal range of motion.     Cervical back: Normal range of motion and neck supple.  Skin:    General: Skin is warm.     Capillary Refill: Capillary refill takes less than 2 seconds.  Neurological:     General: No focal deficit present.     Mental Status: She is alert and oriented to person, place, and time.  Psychiatric:        Mood and Affect: Mood normal.        Behavior: Behavior normal.     ED Results / Procedures / Treatments   Labs (all labs ordered are listed, but only abnormal results are displayed) Labs Reviewed  COMPREHENSIVE METABOLIC PANEL - Abnormal; Notable for the following components:      Result Value   Total Protein 8.3 (*)    All other components within  normal limits  CBC WITH DIFFERENTIAL/PLATELET - Abnormal; Notable for the following components:   RBC 3.64 (*)    Hemoglobin 5.4 (*)    HCT 20.3 (*)    MCV 55.8 (*)    MCH 14.8 (*)    MCHC 26.6 (*)    RDW 22.6 (*)    Platelets 558 (*)    nRBC 0.5 (*)    All other components within normal limits  URINALYSIS, ROUTINE W REFLEX MICROSCOPIC - Abnormal; Notable for the following components:   APPearance CLOUDY (*)    Protein, ur 30 (*)    Leukocytes,Ua MODERATE (*)    Bacteria, UA MANY (*)    All other components within normal limits  RETICULOCYTES - Abnormal; Notable for the following components:   RBC. 3.62 (*)    Immature Retic Fract 19.4 (*)    All other components within normal limits  PREGNANCY, URINE  VITAMIN B12  FOLATE  IRON AND TIBC  FERRITIN  POC OCCULT BLOOD, ED  TYPE AND SCREEN  PREPARE RBC (CROSSMATCH)    EKG None  Radiology No results found.  Procedures Procedures    Medications Ordered in ED Medications  0.9 %  sodium chloride infusion (Manually program via Guardrails IV Fluids) (0 mLs Intravenous Stopped 06/17/22 1526)  cephALEXin (KEFLEX) capsule 500 mg (500 mg Oral Given 06/17/22 1505)    ED Course/ Medical Decision Making/ A&P                             Medical Decision Making Amount and/or Complexity of Data Reviewed Labs: ordered.  Risk Prescription drug management.   This patient presents to the ED for concern of anemia, this involves an extensive number of treatment options, and is a complaint that carries with it a high risk of complications and morbidity.  The differential diagnosis includes gi bleed, vaginal bleeding, other bleeding   Co morbidities that complicate the patient evaluation  anemia, HIV, and GERD   Additional history obtained:  Additional history obtained from epic chart review    Lab Tests:  I Ordered, and personally interpreted labs.  The pertinent results include:  cmp nl, ua with uti, cbc with hgb 5.4, mcv  55.8  Cardiac Monitoring:  The patient was maintained on a cardiac monitor.  I personally viewed and interpreted  the cardiac monitored which showed an underlying rhythm of: nsr   Medicines ordered and prescription drug management:  I ordered medication including transfusion  for symptomatic anemia  Reevaluation of the patient after these medicines showed that the patient improved I have reviewed the patients home medicines and have made adjustments as needed   Critical Interventions:  transfusion   Problem List / ED Course:  Symptomatic anemia: most likely due to DUB.  Pt [facility] f[facility]u with the [facility].  Pt said the ID clinic has referred her to GI today.  Pt will be transfused 2 units here and then will be d/c with iron. UTI:  pt started on keflex.   Reevaluation:  After the interventions noted above, I reevaluated the patient and found that they have :improved   Social Determinants of Health:  Lives at home   Dispostion:  After consideration of the diagnostic results and the patients response to treatment, I feel that the patent would benefit from discharge with outpatient f/u.    CRITICAL CARE Performed by: Isla Pence   Total critical care time: 30 minutes  Critical care time was exclusive of separately billable procedures and treating other patients.  Critical care was necessary to treat or prevent imminent or life-threatening deterioration.  Critical care was time spent personally by me on the following activities: development of treatment plan with patient and/or surrogate as well as nursing, discussions with consultants, evaluation of patient's response to treatment, examination of patient, obtaining history from patient or surrogate, ordering and performing treatments and interventions, ordering and review of laboratory studies, ordering and review of radiographic studies, pulse oximetry and re-evaluation of patient's condition.         Final  Clinical Impression(s) / ED Diagnoses Final diagnoses:  Symptomatic anemia  Acute cystitis without hematuria    Rx / DC Orders ED Discharge Orders          Ordered    cephALEXin (KEFLEX) 500 MG capsule  3 times daily        06/17/22 1355    ferrous sulfate 325 (65 FE) MG tablet  Daily        06/17/22 1558              Isla Pence, MD 06/17/22 1559

## 2022-06-17 NOTE — Telephone Encounter (Signed)
-----   Message from Langdon Callas, NP sent at 06/17/2022  7:32 AM EST ----- Well her hemoglobin is really really low at only 5. She was very resistant to anything hospital related but it's not even to the point I could help arrange blood in an outpatient clinic.   I really think she would feel better if she gets some blood. The iron is not likely to kick in to make much of an effect for a few weeks where blood is immediate. Most common symptoms with anemia like this is fatigue, headache, shortness of breath with activity and feeling cold.   Will you try to give her a call to encourage her to go to [facility] for blood transfusion? She may refuse but i really think she will notice improvement in how she feels. She has been so low so long she probably doesn't know how bad she feels.   There isn't much else I can do outside of the ER unfortunately with the levels that low.

## 2022-06-17 NOTE — Telephone Encounter (Signed)
I attempted to contact the patient to discuss lab results. Patient did not answer and a voicemail was left for the patient to call our office back.

## 2022-06-17 NOTE — Discharge Instructions (Addendum)
You need to follow up with the GI doctor and the OBGyn doctors to determine the reason for your severe anemia.

## 2022-06-17 NOTE — ED Triage Notes (Signed)
Patient arrives ambulatory by POV states she was sent by infectious disease to come and get an iron transfusion. Patient had labs drawn yesterday showing hemoglobin of 5. Denies any active bleeding.

## 2022-06-18 ENCOUNTER — Encounter: Payer: Self-pay | Admitting: Oncology

## 2022-06-18 DIAGNOSIS — N643 Galactorrhea not associated with childbirth: Secondary | ICD-10-CM | POA: Insufficient documentation

## 2022-06-18 LAB — BPAM RBC
Blood Product Expiration Date: 202403222359
Blood Product Expiration Date: 202403222359
ISSUE DATE / TIME: 202403051340
ISSUE DATE / TIME: 202403051544
Unit Type and Rh: 7300
Unit Type and Rh: 7300

## 2022-06-18 LAB — TYPE AND SCREEN
ABO/RH(D): B POS
Antibody Screen: NEGATIVE
Unit division: 0
Unit division: 0

## 2022-06-18 NOTE — Assessment & Plan Note (Signed)
Systolic blood pressures seem pretty persistently above 150/60 in the office.  Defer management to PCP.

## 2022-06-18 NOTE — Assessment & Plan Note (Addendum)
Reports a 3 to 14-monthhistory of bilateral milky/clear discharge from nipples. Will check prolactin level today. Will help arrange diagnostic mammogram today +/- ultrasound if indicated at the breast center. If unrevealing explained we may may need to pursue head imaging to assess pituitary gland.

## 2022-06-18 NOTE — Addendum Note (Signed)
Addended by: [place] Callas on: 06/18/2022 01:23 PM   Modules accepted: Orders

## 2022-06-18 NOTE — Assessment & Plan Note (Signed)
Well-controlled on Cabenuva injections every 2 months.  Has done well with durably suppressed viral loads for almost a year now.  We will certainly continue this dosing scheme for her. Recent CD4 in April [birth year], 1952 indicating maintained reconstitution.  Declined flu shot today. Nice work special no dental needs. Pap smear is up-to-date, will arrange mammogram.  08/21/2022 is her next injection treatment with our pharmacy team.  I will see her back in 6 months.

## 2022-06-18 NOTE — Assessment & Plan Note (Addendum)
Most likely related to heavy and prolonged menstrual cycles associated with uterine fibroids.  I think she should see GYN for management of her menstrual cycles in an effort to treat the primary problem.  She is not terribly interested in returning to hematology for anemia management and iron transfusions.  She would really like to try iron supplementation to see if that helps combat for anemia first -I recommended she take them twice a day with vitamin C.  Would also recommend to start treatment for anticipated constipation with MiraLAX once a day. Also plan GI referral for completeness with colonoscopy to investigate. 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   She looks pale to me on exam today.  She was hesitant to let me recheck her hemoglobin but was willing to do so today.  If persistently less than 6 we will have to see if we could try to get her to go to the ER for transfusion.  I really think it will make her feel much better than she expects.

## 2022-06-19 ENCOUNTER — Encounter: Payer: Self-pay | Admitting: Gastroenterology

## 2022-07-02 ENCOUNTER — Other Ambulatory Visit: Payer: Self-pay | Admitting: Infectious Diseases

## 2022-07-02 DIAGNOSIS — N643 Galactorrhea not associated with childbirth: Secondary | ICD-10-CM

## 2022-07-04 ENCOUNTER — Other Ambulatory Visit: Payer: Self-pay

## 2022-07-04 DIAGNOSIS — N6452 Nipple discharge: Secondary | ICD-10-CM

## 2022-07-15 ENCOUNTER — Ambulatory Visit: Payer: Commercial Managed Care - HMO

## 2022-07-24 ENCOUNTER — Ambulatory Visit: Payer: Commercial Managed Care - HMO

## 2022-07-24 ENCOUNTER — Encounter: Payer: Self-pay | Admitting: Oncology

## 2022-07-28 ENCOUNTER — Other Ambulatory Visit (HOSPITAL_COMMUNITY): Payer: Self-pay

## 2022-07-28 ENCOUNTER — Encounter: Payer: Self-pay | Admitting: Oncology

## 2022-07-29 ENCOUNTER — Telehealth: Payer: Self-pay

## 2022-07-29 NOTE — Telephone Encounter (Signed)
[facility] Patient Advocate Encounter  Renaldo Harrison is covered under patient BCBS medical plan , NO PA required .  Notification will be in the media   [name], CPhT Specialty Pharmacy Patient [facility] Phone: [phone] Fax:  [phone]

## 2022-07-31 ENCOUNTER — Ambulatory Visit: Payer: Self-pay | Admitting: Hematology and Oncology

## 2022-07-31 ENCOUNTER — Other Ambulatory Visit (HOSPITAL_COMMUNITY)
Admission: RE | Admit: 2022-07-31 | Discharge: 2022-07-31 | Disposition: A | Discharge status: Home / Self Care | Payer: BLUE CROSS/BLUE SHIELD | Source: Ambulatory Visit | Attending: Obstetrics and Gynecology | Admitting: Obstetrics and Gynecology

## 2022-07-31 VITALS — BP 141/60 | Wt 100.0 lb

## 2022-07-31 DIAGNOSIS — Z01419 Encounter for gynecological examination (general) (routine) without abnormal findings: Secondary | ICD-10-CM

## 2022-07-31 DIAGNOSIS — N6452 Nipple discharge: Secondary | ICD-10-CM | POA: Insufficient documentation

## 2022-07-31 NOTE — Patient Instructions (Signed)
Taught [name] about self breast awareness and gave educational materials to take home. Patient did need a Pap smear today due to last Pap smear was in 2021 per patient. She will return next month for Pap smear.  Let her know [facility] will cover Pap smears every 3 years unless has a history of abnormal Pap smears. Referred patient to the [facility] for diagnostic mammogram. Appointment scheduled for 08/06/22. Patient aware of appointment and will be there. Let patient know will follow up with her within the next couple weeks with results. [name] verbalized understanding.  Pascal Lux, NP 2:05 PM

## 2022-07-31 NOTE — Progress Notes (Signed)
Shirley White is a 52 y.o. female who presents to [facility] clinic today with complaint of bilateral nipple discharge.    Pap Smear: Pap not smear completed today. Last Pap smear was 2021 and was abnormal - LSIL/HPV- . Per patient has history of an abnormal Pap smear. Last Pap smear result is available in Epic.   Physical exam: Breasts Breasts symmetrical. No skin abnormalities bilateral breasts. No nipple retraction bilateral breasts. Straw colored nipple discharge bilateral breasts. No lymphadenopathy. No lumps palpated bilateral breasts.  MM Breast Surgical Specimen  Result Date: 06/23/2018 CLINICAL DATA:  Post excisional biopsy for right breast intraductal papilloma. EXAM: SPECIMEN RADIOGRAPH OF THE RIGHT BREAST COMPARISON:  Previous exam(s). FINDINGS: Status post excision of the right breast. The radioactive seed and biopsy marker clip are present, and completely intact. IMPRESSION: Specimen radiograph of the right breast. Electronically Signed   By: Ted Mcalpine M.D.   On: 06/23/2018 09:39   MM RT RADIOACTIVE SEED LOC MAMMO GUIDE  Result Date: 06/22/2018 CLINICAL DATA:  Preoperative localization prior to right breast excisional biopsy. EXAM: MAMMOGRAPHIC GUIDED RADIOACTIVE SEED LOCALIZATION OF THE RIGHT BREAST COMPARISON:  Previous exam(s). FINDINGS: Patient presents for radioactive seed localization prior to right breast excision. I met with the patient and we discussed the procedure of seed localization including benefits and alternatives. We discussed the high likelihood of a successful procedure. We discussed the risks of the procedure including infection, bleeding, tissue injury and further surgery. We discussed the low dose of radioactivity involved in the procedure. Informed, written consent was given. The usual time-out protocol was performed immediately prior to the procedure. Using mammographic guidance, sterile technique, 1% lidocaine and an I-125 radioactive seed, ribbon  shaped marking clip was localized using a lateral approach. The follow-up mammogram images confirm the seed in the expected location and were marked for Dr. Donell Beers. Follow-up survey of the patient confirms presence of the radioactive seed. Order number of I-125 seed:  [id number]. Total activity:  0.253 millicuries reference Date: 06/15/2018 The patient tolerated the procedure well and was released from the Breast Center. She was given instructions regarding seed removal. IMPRESSION: Radioactive seed localization right breast. No apparent complications. Electronically Signed   By: Annia Belt M.D.   On: 06/22/2018 14:24   MM CLIP PLACEMENT RIGHT  Result Date: 04/19/2018 CLINICAL DATA:  Confirmation of clip placement after ultrasound-guided core needle biopsy of adjacent intraductal masses at the 7 o'clock position of the RIGHT breast approximately 2 cm from the nipple. EXAM: DIAGNOSTIC RIGHT MAMMOGRAM POST ULTRASOUND BIOPSY COMPARISON:  Previous exam(s). FINDINGS: Mammographic images were obtained following ultrasound guided biopsy of adjacent intraductal masses at the 7 o'clock position of the RIGHT breast approximately 2 cm from the nipple. Only a single ribbon shaped tissue marker clip was placed in to the mass farthest from the nipple since the masses are medially adjacent to one another. The ribbon shaped tissue marker clip is appropriately position in the slight LOWER OUTER RIGHT breast at the approximate 6:30-7 o'clock location approximately 2-3 cm from the nipple. Expected post biopsy changes are present without evidence of hematoma. Gas bubbles related to the biopsy are present within the previously identified ectatic ducts in the LOWER breast. IMPRESSION: Appropriate positioning of the ribbon shaped tissue marker clip at the site of the biopsied intraductal mass farthest from the nipple in the LOWER OUTER QUADRANT of the RIGHT breast. Final Assessment: Post Procedure Mammograms for Marker Placement  Electronically Signed   By: Kayren Eaves.D.  On: 04/19/2018 08:32   MS DIGITAL DIAG TOMO BILAT  Result Date: 04/09/2018 CLINICAL DATA:  Small, pea sized lump felt on recent physical examination in the 4 o'clock position of the right breast, 2 cm from the nipple. The patient does not feel a mass today. She reports that she has had mild spontaneous dark brown colored nipple discharge on the right for the past 21 years. EXAM: DIGITAL DIAGNOSTIC BILATERAL MAMMOGRAM WITH CAD AND TOMO ULTRASOUND RIGHT BREAST COMPARISON:  None. ACR Breast Density Category c: The breast tissue is heterogeneously dense, which may obscure small masses. FINDINGS: Normal appearing breast tissue bilaterally with no mammographically visible mass or other findings suspicious for malignancy in either breast. Mammographic images were processed with CAD. On physical exam, there is an approximately 6 mm rounded, circumscribed, firm palpable mass in the 4 o'clock position of the right breast, 2 cm from the nipple. There are no palpable retroareolar masses on the right. A small amount of clear brown colored discharge was elicited from 2 right nipple orifices, 1 in the 10 o'clock position and 1 in the 8 o'clock position. There are no palpable right axillary lymph nodes. Targeted ultrasound is performed, showing dense glandular tissue with a convex anterior margin extending just beneath the skin in the 4 o'clock position of the right breast, 2 cm from the nipple, corresponding to the palpable mass. No actual mass is seen. There are multiple dilated retroareolar ducts on the right. One of the ducts, in the 7 o'clock position, 2 cm from the nipple, contains an 8 mm oval, circumscribed, medium echotexture mass with internal blood flow with power Doppler. More superficially, in the 7 o'clock retroareolar region of the right breast, this duct contains a 2nd mass measuring 11 mm in maximum diameter. This is also medium echotexture and contains some  echogenic components. No definite internal blood flow was seen with power Doppler. Ultrasound of the right axilla demonstrated normal appearing right axillary lymph nodes. IMPRESSION: 1. Right breast duct ectasia with a dilated duct in the 7 o'clock position containing 2 intraductal masses, as described above. 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 2. The palpable mass in the 4 o'clock position of the right breast is normal dense glandular tissue with a convex anterior margin. RECOMMENDATION: Ultrasound guided core needle biopsies of the 2 intraductal masses in the 7 o'clock position of the right breast. This has been discussed with the patient and scheduled at 7:30 a.m. on 04/19/2018. I have discussed the findings and recommendations with the patient. Results were also provided in writing at the conclusion of the visit. If applicable, a reminder letter will be sent to the patient regarding the next appointment. BI-RADS CATEGORY  4: Suspicious. Electronically Signed   By: Beckie Salts M.D.   On: 04/09/2018 11:15         Pelvic/Bimanual Pap is not indicated today    Smoking History: Patient has never smoked and was not referred to quit line.    Patient Navigation: Patient education provided. Access to services provided for patient through [facility] program. No interpreter provided. No transportation provided   Colorectal Cancer Screening: Per patient has had colonoscopy completed on 07/14/19 and underwent treatment for invasive adenocarcinoma of the colon.  No complaints today.    Breast and Cervical Cancer Risk Assessment: Patient does not have family history of breast cancer, known genetic mutations, or radiation treatment to the chest before age 72. Patient has history of cervical dysplasia, immunocompromised, or DES exposure in-utero.  Risk Scores  as of 07/31/2022     Shirley White           5-year 0.88 %   Lifetime 6.7 %            Last calculated by Caprice Red, CMA on 07/31/2022 at  1:46  PM        A: [facility] exam without pap smear Complaint of bilateral nipple discharge.  P: Referred patient to the [facility] of [facility] for a diagnostic mammogram. Appointment scheduled 08/06/22.  Ilda Basset A, NP 07/31/2022 2:01 PM

## 2022-08-04 LAB — CYTOLOGY - NON PAP

## 2022-08-06 ENCOUNTER — Telehealth: Payer: Self-pay

## 2022-08-06 NOTE — Telephone Encounter (Signed)
MyChart message sent to pt regarding nipple discharge results.

## 2022-08-14 ENCOUNTER — Other Ambulatory Visit: Payer: Commercial Managed Care - HMO

## 2022-08-17 ENCOUNTER — Encounter: Payer: Self-pay | Admitting: Oncology

## 2022-08-20 ENCOUNTER — Telehealth: Payer: Self-pay

## 2022-08-20 ENCOUNTER — Other Ambulatory Visit: Payer: Self-pay | Admitting: Pharmacist

## 2022-08-20 ENCOUNTER — Encounter: Payer: Self-pay | Admitting: Oncology

## 2022-08-20 ENCOUNTER — Other Ambulatory Visit (HOSPITAL_COMMUNITY): Payer: Self-pay

## 2022-08-20 ENCOUNTER — Other Ambulatory Visit: Payer: Self-pay

## 2022-08-20 DIAGNOSIS — Z21 Asymptomatic human immunodeficiency virus [HIV] infection status: Secondary | ICD-10-CM

## 2022-08-20 MED ORDER — CABOTEGRAVIR & RILPIVIRINE ER 600 & 900 MG/3ML IM SUER
1.0000 | INTRAMUSCULAR | 5 refills | Status: DC
  Filled 2022-08-20 (×2): qty 6, 60d supply, fill #0
  Filled 2022-10-09: qty 6, 60d supply, fill #1
  Filled 2022-12-04: qty 6, 60d supply, fill #2
  Filled 2023-02-10: qty 6, 60d supply, fill #3

## 2022-08-20 NOTE — Progress Notes (Signed)
HPI: Shirley White is a 52 y.o. female who presents to the [facility] pharmacy clinic for [place] administration.  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   CABOTEGRAVIR & RILPIVIRINE ER (CABENUVA) 600 & 900 MG/3ML INJECTION    Inject 1 kit into the muscle every 2 (two) months.   FERROUS SULFATE 325 (65 FE) MG TABLET    Take 1 tablet (325 mg total) by mouth daily.  Modified Medications   No medications on file  Discontinued Medications   No medications on file    Allergies: No Known Allergies  Past Medical History: Past Medical History:  Diagnosis Date   Anemia    GERD (gastroesophageal reflux disease)    no meds   Heart murmur    HIV disease (HCC)     Social History: Social History   Socioeconomic History   Marital status: Single    Spouse name: Not on file   Number of children: 4   Years of education: Not on file   Highest education level: 12th grade  Occupational History   Occupation: Conservation officer, nature at [employer]  Tobacco Use   Smoking status: Never   Smokeless tobacco: Never  Vaping Use   Vaping Use: Never used  Substance and Sexual Activity   Alcohol use: Not Currently    Alcohol/week: 1.0 standard drink of alcohol    Types: 1 Cans of beer per week    Comment: occa    Drug use: Not Currently    Types: Cocaine, Marijuana   Sexual activity: Not Currently    Partners: Male    Birth control/protection: Condom    Comment: DECLINED CONDOMS 06/2020  Other Topics Concern   Not on file  Social History Narrative   Patient lives alone   Patient has to take public transportation   Social Determinants of Health   Financial Resource Strain: Not on file  Food Insecurity: No Food Insecurity (07/31/2022)   Hunger Vital Sign    Worried About Running Out of Food in the Last Year: Never true    Ran Out of Food in the Last Year: Never true  Transportation Needs: No Transportation Needs (07/31/2022)   PRAPARE - Administrator, Civil Service (Medical): No    Lack of Transportation (Non-Medical): No  Physical Activity: Not on file  Stress: Not on file  Social Connections: Not on file    Labs: Lab Results  Component Value Date   HIV1RNAQUANT Not Detected 04/29/2022   HIV1RNAQUANT Not Detected 12/25/2021   HIV1RNAQUANT Not Detected 10/23/2021   CD4TABS 554 04/29/2022   CD4TABS 462 12/20/2020   CD4TABS 338 (L) 03/01/2020    RPR and STI Lab Results  Component Value Date   LABRPR NON-REACTIVE 12/20/2020   LABRPR NON-REACTIVE 06/22/2018   LABRPR NON  REAC 08/21/2015   LABRPR NON REAC 09/05/2014   LABRPR NON REAC 09/24/2009    STI Results GC CT  04/29/2018 12:00 AM Negative  Negative   03/22/2018 12:00 AM Negative  Negative   08/21/2015 12:00 AM Negative  Negative   09/05/2014 12:00 AM Negative  Negative     Hepatitis B Lab Results  Component Value Date   HEPBSAB NEG 09/05/2014   HEPBSAG NEGATIVE 09/05/2014   HEPBCAB NON REACTIVE 09/05/2014   Hepatitis C No results found for: "HEPCAB", "HCVRNAPCRQN" Hepatitis A Lab Results  Component Value Date   HAV REACTIVE (A) 09/05/2014   Lipids: Lab Results  Component Value Date   CHOL 193 03/01/2020   TRIG 81 03/01/2020   HDL 66 03/01/2020   CHOLHDL 2.9 03/01/2020   VLDL 12 08/21/2015    LDLCALC 110 (H) 03/01/2020    TARGET DATE: the 11th  Assessment: Shirley White presents today for her maintenance Cabenuva injections. Past injections were tolerated well without issues. She got married in 05/2022 and reports no new sexual partners. She declines STI testing at this time.   Administered cabotegravir 600mg /65mL in left upper outer quadrant of the gluteal muscle. Administered rilpivirine 900 mg/4mL in the right upper outer quadrant of the gluteal muscle. No issues with injections. We will follow up in 2 months for next set of injections.  She is eligible for COVID-19, Tdap, HepB, and pneumococcal vaccinations. She politely declined vaccinations at this time.   Plan: - Cabenuva injections administered - Next injection scheduled for 10/23/22 with Shirley White - Follow up with Shirley White on 12/01/22 - Call with any issues or questions  Irish Elders, PharmD PGY-1 [facility] Resident

## 2022-08-20 NOTE — Progress Notes (Signed)
Patient no longer insured with BCBS - now has [employer] through [employer]. Resending Guinea script to [facility] for Lupita Leash.  Margarite Gouge, PharmD, CPP, BCIDP, AAHIVP Clinical Pharmacist Practitioner Infectious Diseases Clinical Pharmacist [facility] for Infectious Disease

## 2022-08-20 NOTE — Telephone Encounter (Signed)
[facility] Patient Advocate Encounter  Patient now have Rx [employer], Shirley White is covered under patient pharmacy benefits.  Prescription will be filled at [facility].  [name], CPhT Specialty Pharmacy Patient [facility] for Infectious Disease Phone: [phone] Fax:  [phone]

## 2022-08-21 ENCOUNTER — Ambulatory Visit (INDEPENDENT_AMBULATORY_CARE_PROVIDER_SITE_OTHER): Payer: 59 | Admitting: Pharmacist

## 2022-08-21 ENCOUNTER — Other Ambulatory Visit: Payer: Self-pay

## 2022-08-21 ENCOUNTER — Other Ambulatory Visit (HOSPITAL_COMMUNITY): Payer: Self-pay

## 2022-08-21 DIAGNOSIS — Z21 Asymptomatic human immunodeficiency virus [HIV] infection status: Secondary | ICD-10-CM | POA: Diagnosis not present

## 2022-08-21 DIAGNOSIS — Z113 Encounter for screening for infections with a predominantly sexual mode of transmission: Secondary | ICD-10-CM

## 2022-08-21 MED ORDER — CABOTEGRAVIR & RILPIVIRINE ER 600 & 900 MG/3ML IM SUER
1.0000 | Freq: Once | INTRAMUSCULAR | Status: AC
Start: 2022-08-21 — End: 2022-08-21
  Administered 2022-08-21: 1 via INTRAMUSCULAR

## 2022-08-22 ENCOUNTER — Telehealth: Payer: Self-pay

## 2022-08-22 NOTE — Telephone Encounter (Signed)
[facility] Patient Advocate Encounter  Patient's medication Shirley White) have been couriered to [facility] from [facility] and was administered on the patient office visit on 08/21/22.  [name] , CPhT Specialty Pharmacy Patient [facility] for Infectious Disease Phone: [phone] Fax:  [phone]

## 2022-09-03 ENCOUNTER — Ambulatory Visit: Payer: BLUE CROSS/BLUE SHIELD

## 2022-09-03 ENCOUNTER — Other Ambulatory Visit: Payer: Self-pay | Admitting: Hematology and Oncology

## 2022-09-03 DIAGNOSIS — Z124 Encounter for screening for malignant neoplasm of cervix: Secondary | ICD-10-CM

## 2022-09-03 NOTE — Progress Notes (Signed)
Patient: Shirley White           Date of Birth: [birth year]           MRN: [id number] Visit Date: 09/03/2022 PCP: Marcine Matar, MD     Cervical Exam Pap smear completed: Pap test Abnormal Observations: Normal exam. Recommendations: Will repeat in 1 year as last results were LSIL/ HPV+ in 2021 and would like to evaluate yearly for 3 negatives in a row.       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   Adenocarcinoma, colon (HCC) 07/20/2019   HIV (human immunodeficiency virus infection) (HCC) 07/14/2018   LGSIL on Pap smear of cervix 04/29/2018   Routine health maintenance 08/28/2016   H/O menorrhagia 09/05/2014   Iron deficiency anemia 09/05/2014   Sickle-cell trait (HCC) 06/04/2006   Past Medical History:  Diagnosis Date   Anemia    Cancer of appendix (HCC) 2021   GERD (gastroesophageal reflux disease)    no meds   Heart murmur    HIV disease (HCC)     Family History  Problem Relation Age of Onset   Hyperlipidemia Mother    Hypertension Mother    Throat cancer Father    Prostate cancer Paternal Grandfather    Colon cancer Maternal Aunt    Leukemia Neg Hx    Lymphoma Neg Hx    Esophageal cancer Neg Hx    Stomach cancer Neg Hx    Rectal cancer Neg Hx     Social History   Occupational History   Occupation: Conservation officer, nature at [employer]  Tobacco Use   Smoking status: Never   Smokeless tobacco: Never  Vaping Use   Vaping Use: Never used  Substance and Sexual Activity   Alcohol use: Not Currently    Alcohol/week: 1.0 standard drink of alcohol    Types: 1 Cans of beer per week    Comment: occa   Drug use: Not Currently    Types: Cocaine, Marijuana   Sexual activity: Not Currently    Partners: Male    Birth control/protection: Condom     Comment: DECLINED CONDOMS 06/2020

## 2022-09-09 ENCOUNTER — Ambulatory Visit: Payer: 59 | Admitting: Gastroenterology

## 2022-09-09 LAB — CYTOLOGY - PAP
Adequacy: ABSENT
Comment: NEGATIVE
Diagnosis: NEGATIVE
High risk HPV: NEGATIVE

## 2022-09-16 ENCOUNTER — Telehealth: Payer: Self-pay

## 2022-09-16 DIAGNOSIS — B9689 Other specified bacterial agents as the cause of diseases classified elsewhere: Secondary | ICD-10-CM

## 2022-09-16 MED ORDER — METRONIDAZOLE 500 MG PO TABS: 500.0000 mg | ORAL_TABLET | Freq: Two times a day (BID) | ORAL | 0 refills | Status: AC

## 2022-09-16 NOTE — Telephone Encounter (Signed)
I've attempted to reach the pt twice. Her number rings straight to voicemail. I have left a message requesting to call me back.  I've called her listed contact, Corrie Dandy, her mother, as well. Her number is not in service at this time.  I will also send her a MyChart message as well. With verbal authorization, I have sent a rx for flagyl to pts primary pharmacy listed, [facility] on [address].

## 2022-09-17 NOTE — Telephone Encounter (Signed)
I've tried to reach the pt again. I have left another message and requested she confirm receipt of my messages via telephone or by responding to the MyChart message I sent her.

## 2022-09-23 NOTE — Telephone Encounter (Signed)
Pt has returned my call and confirmed receipt of her results and rx.

## 2022-09-25 NOTE — Progress Notes (Signed)
The 10-year ASCVD risk score (Arnett DK, et al., 2019) is: 2.1%   Values used to calculate the score:     Age: 52 years     Sex: Female     Is Non-Hispanic African American: Yes     Diabetic: No     Tobacco smoker: No     Systolic Blood Pressure: 141 mmHg     Is BP treated: No     HDL Cholesterol: 66 mg/dL     Total Cholesterol: 193 mg/dL  Sandie Ano, RN

## 2022-10-07 ENCOUNTER — Other Ambulatory Visit (HOSPITAL_COMMUNITY): Payer: Self-pay

## 2022-10-09 ENCOUNTER — Other Ambulatory Visit (HOSPITAL_COMMUNITY): Payer: Self-pay

## 2022-10-10 ENCOUNTER — Other Ambulatory Visit (HOSPITAL_COMMUNITY): Payer: Self-pay

## 2022-10-13 ENCOUNTER — Telehealth: Payer: Self-pay | Admitting: Pharmacy Technician

## 2022-10-13 NOTE — Telephone Encounter (Signed)
[facility] Patient Advocate Encounter  Patient's medication, Shirley White, has been couriered to [facility] from [facility] and will be administered at pt's next visit  10/23/22.

## 2022-10-23 ENCOUNTER — Other Ambulatory Visit: Payer: Self-pay

## 2022-10-23 ENCOUNTER — Ambulatory Visit: Payer: 59 | Admitting: Pharmacist

## 2022-10-23 DIAGNOSIS — Z23 Encounter for immunization: Secondary | ICD-10-CM | POA: Diagnosis not present

## 2022-10-23 DIAGNOSIS — Z21 Asymptomatic human immunodeficiency virus [HIV] infection status: Secondary | ICD-10-CM | POA: Diagnosis not present

## 2022-10-23 MED ORDER — CABOTEGRAVIR & RILPIVIRINE ER 600 & 900 MG/3ML IM SUER
1.0000 | Freq: Once | INTRAMUSCULAR | Status: AC
Start: 2022-10-23 — End: 2022-10-23
  Administered 2022-10-23: 1 via INTRAMUSCULAR

## 2022-10-23 NOTE — Progress Notes (Signed)
HPI: Shirley White is a 52 y.o. female who presents to the [facility] pharmacy clinic for [place] administration.  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   FERROUS SULFATE 325 (65 FE) MG TABLET    Take 1 tablet (325 mg total) by mouth daily.  Modified Medications   No medications on file  Discontinued Medications   No medications on file    Allergies: No Known Allergies  Past Medical History: Past Medical History:  Diagnosis Date   Anemia    Cancer of appendix (HCC) 2021   GERD (gastroesophageal reflux disease)    no meds   Heart murmur    HIV disease (HCC)     Social History: Social History   Socioeconomic History   Marital status: Single    Spouse name: Not on file   Number of children: 4   Years of education: Not on file   Highest education level: 12th grade  Occupational History   Occupation: Conservation officer, nature at [employer]  Tobacco Use   Smoking status: Never   Smokeless tobacco: Never  Vaping Use   Vaping status: Never Used  Substance and Sexual Activity   Alcohol use: Not Currently    Alcohol/week: 1.0 standard drink of alcohol    Types: 1 Cans of beer per week    Comment: occa   Drug use: Not Currently    Types: Cocaine, Marijuana   Sexual activity: Not  Currently    Partners: Male    Birth control/protection: Condom    Comment: DECLINED CONDOMS 06/2020  Other Topics Concern   Not on file  Social History Narrative   Patient lives alone   Patient has to take public transportation   Social Determinants of Health   Financial Resource Strain: Not on file  Food Insecurity: No Food Insecurity (07/31/2022)   Hunger Vital Sign    Worried About Running Out of Food in the Last Year: Never true    Ran Out of Food in the Last Year: Never true  Transportation Needs: No Transportation Needs (07/31/2022)   PRAPARE - Administrator, Civil Service (Medical): No    Lack of Transportation (Non-Medical): No  Physical Activity: Not on file  Stress: Not on file  Social Connections: Not on file    Labs: Lab Results  Component Value Date   HIV1RNAQUANT Not Detected 04/29/2022   HIV1RNAQUANT Not Detected 12/25/2021   HIV1RNAQUANT Not Detected 10/23/2021   CD4TABS 554 04/29/2022   CD4TABS 462 12/20/2020   CD4TABS 338 (L) 03/01/2020    RPR and STI Lab Results  Component Value Date   LABRPR NON-REACTIVE 12/20/2020   LABRPR NON-REACTIVE 06/22/2018   LABRPR NON REAC 08/21/2015   LABRPR NON REAC 09/05/2014   LABRPR NON REAC 09/24/2009    STI  Results GC CT  04/29/2018 12:00 AM Negative  Negative   03/22/2018 12:00 AM Negative  Negative   08/21/2015 12:00 AM Negative  Negative   09/05/2014 12:00 AM Negative  Negative     Hepatitis B Lab Results  Component Value Date   HEPBSAB NEG 09/05/2014   HEPBSAG NEGATIVE 09/05/2014   HEPBCAB NON REACTIVE 09/05/2014   Hepatitis C No results found for: "HEPCAB", "HCVRNAPCRQN" Hepatitis A Lab Results  Component Value Date   HAV REACTIVE (A) 09/05/2014   Lipids: Lab Results  Component Value Date   CHOL 193 03/01/2020   TRIG 81 03/01/2020   HDL 66 03/01/2020   CHOLHDL 2.9 03/01/2020   VLDL 12 08/21/2015   LDLCALC 110 (H) 03/01/2020    TARGET DATE:  The 11th of the month  Current  HIV Regimen: Cabenuva  Assessment: Shirley White presents today for their maintenance Cabenuva injections. Initial/past injections were tolerated well without issues. No problems with systemic effects of injections.   Administered cabotegravir 600mg /64mL in left upper outer quadrant of the gluteal muscle. Administered rilpivirine 900 mg/46mL in the right upper outer quadrant of the gluteal muscle. Monitored patient for 10 minutes after injection. Injections were tolerated well without issue. Patient will follow up in 2 months for next injection. Will check HIV RNA today.  Due for Tdap booster, PCV20, and HBV series. Administered Tdap and PCV20 today; she will start HBV series at next visit with Shirley White. Seeing Shirley White in between [place] visits given limited scheduling availability.   Plan: - Cabenuva injections administered - Check HIV RNA  - Administer Tdap and PCV20  - Next injections scheduled for 9/4 with me  - Follow-up with Shirley White on 8/19  - Call with any issues or questions  Margarite Gouge, PharmD, CPP, BCIDP, AAHIVP Clinical Pharmacist Practitioner Infectious Diseases Clinical Pharmacist [facility] for Infect[facility] Disease

## 2022-10-25 LAB — HIV-1 RNA QUANT-NO REFLEX-BLD
HIV 1 RNA Quant: NOT DETECTED Copies/mL
HIV-1 RNA Quant, Log: NOT DETECTED Log cps/mL

## 2022-11-18 ENCOUNTER — Encounter: Payer: Self-pay | Admitting: Oncology

## 2022-12-01 ENCOUNTER — Ambulatory Visit: Payer: 59 | Admitting: Infectious Diseases

## 2022-12-04 ENCOUNTER — Other Ambulatory Visit (HOSPITAL_COMMUNITY): Payer: Self-pay

## 2022-12-04 ENCOUNTER — Other Ambulatory Visit: Payer: Self-pay

## 2022-12-08 ENCOUNTER — Telehealth: Payer: Self-pay

## 2022-12-08 NOTE — Telephone Encounter (Signed)
[facility] Patient Advocate Encounter  Patient's medication Renaldo Harrison) have been couriered to [facility] from [facility] [facility] and will be administered on the patient next office visit on 12/17/22.  [name] , CPhT Specialty Pharmacy Patient [facility] for Infectious Disease Phone: [phone] Fax:  [phone]

## 2022-12-17 ENCOUNTER — Other Ambulatory Visit: Payer: Self-pay

## 2022-12-17 ENCOUNTER — Ambulatory Visit (INDEPENDENT_AMBULATORY_CARE_PROVIDER_SITE_OTHER): Payer: 59 | Admitting: Pharmacist

## 2022-12-17 DIAGNOSIS — B20 Human immunodeficiency virus [HIV] disease: Secondary | ICD-10-CM

## 2022-12-17 DIAGNOSIS — Z21 Asymptomatic human immunodeficiency virus [HIV] infection status: Secondary | ICD-10-CM

## 2022-12-17 MED ORDER — CABOTEGRAVIR & RILPIVIRINE ER 600 & 900 MG/3ML IM SUER
1.0000 | Freq: Once | INTRAMUSCULAR | Status: AC
Start: 2022-12-17 — End: 2022-12-17
  Administered 2022-12-17: 1 via INTRAMUSCULAR

## 2022-12-17 NOTE — Progress Notes (Signed)
HPI: Shirley White is a 52 y.o. female who presents to the [facility] pharmacy clinic for [place] administration.  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   FERROUS SULFATE 325 (65 FE) MG TABLET    Take 1 tablet (325 mg total) by mouth daily.  Modified Medications   No medications on file  Discontinued Medications   No medications on file    Allergies: No Known Allergies  Past Medical History: Past Medical History:  Diagnosis Date   Anemia    Cancer of appendix (HCC) 2021   GERD (gastroesophageal reflux disease)    no meds   Heart murmur    HIV disease (HCC)     Social History: Social History   Socioeconomic History   Marital status: Single    Spouse name: Not on file   Number of children: 4   Years of education: Not on file   Highest education level: 12th grade  Occupational History   Occupation: Conservation officer, nature at [employer]  Tobacco Use   Smoking status: Never   Smokeless tobacco: Never  Vaping Use   Vaping status: Never Used  Substance and Sexual Activity   Alcohol use: Not Currently    Alcohol/week: 1.0 standard drink of alcohol    Types: 1 Cans of beer per week    Comment: occa   Drug use: Not Currently    Types: Cocaine, Marijuana   Sexual activity: Not  Currently    Partners: Male    Birth control/protection: Condom    Comment: DECLINED CONDOMS 06/2020  Other Topics Concern   Not on file  Social History Narrative   Patient lives alone   Patient has to take public transportation   Social Determinants of Health   Financial Resource Strain: Not on file  Food Insecurity: No Food Insecurity (07/31/2022)   Hunger Vital Sign    Worried About Running Out of Food in the Last Year: Never true    Ran Out of Food in the Last Year: Never true  Transportation Needs: No Transportation Needs (07/31/2022)   PRAPARE - Administrator, Civil Service (Medical): No    Lack of Transportation (Non-Medical): No  Physical Activity: Not on file  Stress: Not on file  Social Connections: Not on file    Labs: Lab Results  Component Value Date   HIV1RNAQUANT Not Detected 10/23/2022   HIV1RNAQUANT Not Detected 04/29/2022   HIV1RNAQUANT Not Detected 12/25/2021   CD4TABS 554 04/29/2022   CD4TABS 462 12/20/2020   CD4TABS 338 (L) 03/01/2020    RPR and STI Lab Results  Component Value Date   LABRPR NON-REACTIVE 12/20/2020   LABRPR NON-REACTIVE 06/22/2018   LABRPR NON REAC 08/21/2015   LABRPR NON REAC 09/05/2014   LABRPR NON REAC 09/24/2009    STI  Results GC CT  04/29/2018 12:00 AM Negative  Negative   03/22/2018 12:00 AM Negative  Negative   08/21/2015 12:00 AM Negative  Negative   09/05/2014 12:00 AM Negative  Negative     Hepatitis B Lab Results  Component Value Date   HEPBSAB NEG 09/05/2014   HEPBSAG NEGATIVE 09/05/2014   HEPBCAB NON REACTIVE 09/05/2014   Hepatitis C No results found for: "HEPCAB", "HCVRNAPCRQN" Hepatitis A Lab Results  Component Value Date   HAV REACTIVE (A) 09/05/2014   Lipids: Lab Results  Component Value Date   CHOL 193 03/01/2020   TRIG 81 03/01/2020   HDL 66 03/01/2020   CHOLHDL 2.9 03/01/2020   VLDL 12 08/21/2015   LDLCALC 110 (H) 03/01/2020    TARGET DATE:  The 11th of the month  Current  HIV Regimen: Cabenuva  Assessment: Shirley White presents today for their maintenance Cabenuva injections. Initial/past injections were tolerated well without issues. No problems with systemic effects of injections. Patient did experience some mild itching the week before getting this shot; sounds like an anticipatory feeling that should not require any intervention. She stated she used some anti-itch cream for relief.    Administered cabotegravir 600mg /44mL in left upper outer quadrant of the gluteal muscle. Administered rilpivirine 900 mg/71mL in the right upper outer quadrant of the gluteal muscle. Monitored patient for 10 minutes after injection. Injections were tolerated well without issue. Patient will follow up in 2 months for next injection. Will defer HIV RNA until next visit. Politely declines STI testing today as she remains exclusive with her husband.   Due for annual flu vaccine and HBV vaccine series which she would like to receive at her next appointment with [facility].   Plan: - Cabenuva injections administered - Next injections scheduled for 11/12 with [name] and 1/7 with me  - Call with any issues or questions  Margarite Gouge, PharmD, CPP, BCIDP, AAHIVP Clinical Pharmacist Practitioner Infectious Diseases Clinical Pharmacist [facility]

## 2022-12-22 ENCOUNTER — Telehealth: Payer: Self-pay

## 2022-12-22 NOTE — Telephone Encounter (Signed)
Thank you for letting me know

## 2022-12-22 NOTE — Telephone Encounter (Signed)
Patient called stating that she has a knot at Guinea injection site. Informed patient that this is a common side effect of Cabenuva. Patient stated that she has been taking warm baths to help. I advised she could also take tylenol or ibuprofen to help with the pain but the knot and pain should subside with time. Patient will contact office if symptoms do not improve.    Rana Adorno Lesli Albee, CMA

## 2022-12-26 ENCOUNTER — Other Ambulatory Visit (HOSPITAL_COMMUNITY): Payer: Self-pay

## 2023-01-06 ENCOUNTER — Encounter (INDEPENDENT_AMBULATORY_CARE_PROVIDER_SITE_OTHER): Payer: Self-pay

## 2023-01-06 DIAGNOSIS — B370 Candidal stomatitis: Secondary | ICD-10-CM

## 2023-01-06 MED ORDER — FLUCONAZOLE 100 MG PO TABS
100.0000 mg | ORAL_TABLET | Freq: Every day | ORAL | 0 refills | Status: AC
Start: 2023-01-06 — End: ?

## 2023-01-06 MED ORDER — FLUCONAZOLE 100 MG PO TABS: 100.0000 mg | ORAL_TABLET | Freq: Every day | ORAL | 0 refills | Status: DC

## 2023-01-06 NOTE — Telephone Encounter (Signed)
Please see the MyChart message reply(ies) for my assessment and plan.    This patient gave consent for this Medical Advice Message and is aware that it may result in a bill to [employer], as well as the possibility of receiving a bill for a co-payment or deductible. They are an established patient, but are not seeking medical advice exclusively about a problem treated during an in person or video visit in the last seven days. I did not recommend an in person or video visit within seven days of my reply.    I spent a total of 4 minutes cumulative time within 7 days through [employer].  Rexene Alberts, NP

## 2023-01-06 NOTE — Addendum Note (Signed)
Addended by: Blanchard Kelch on: 01/06/2023 03:02 PM   Modules accepted: Orders

## 2023-02-05 ENCOUNTER — Other Ambulatory Visit: Payer: Self-pay

## 2023-02-10 ENCOUNTER — Other Ambulatory Visit: Payer: Self-pay

## 2023-02-10 ENCOUNTER — Other Ambulatory Visit (HOSPITAL_COMMUNITY): Payer: Self-pay

## 2023-02-10 NOTE — Progress Notes (Signed)
Specialty Pharmacy Refill Coordination Note  Shirley White is a 52 y.o. female assessed today regarding refills of clinic administered specialty medication(s) Cabotegravir & Rilpivirine   Clinic requested Courier to Provider Office   Delivery date: 02/19/23   Verified address: [facility] [address] [place] [id number]   Medication will be filled on 02/18/23.

## 2023-02-18 ENCOUNTER — Other Ambulatory Visit: Payer: Self-pay

## 2023-02-18 ENCOUNTER — Encounter: Payer: Self-pay | Admitting: Oncology

## 2023-02-18 ENCOUNTER — Telehealth: Payer: Self-pay

## 2023-02-18 ENCOUNTER — Other Ambulatory Visit (HOSPITAL_COMMUNITY): Payer: Self-pay

## 2023-02-18 NOTE — Telephone Encounter (Signed)
Pharmacy Patient Advocate Encounter- Shirley White BIV-Medical Benefit:  J code: [id number]  CPT code: 60454  Dx Code: B20  NO PA is required for J0741 through to [facility]) .  Notice will be in Media

## 2023-02-24 ENCOUNTER — Encounter: Payer: Self-pay | Admitting: Infectious Diseases

## 2023-02-24 ENCOUNTER — Encounter: Payer: Self-pay | Admitting: Oncology

## 2023-02-24 ENCOUNTER — Other Ambulatory Visit: Payer: Self-pay

## 2023-02-24 ENCOUNTER — Ambulatory Visit (INDEPENDENT_AMBULATORY_CARE_PROVIDER_SITE_OTHER): Payer: 59 | Admitting: Infectious Diseases

## 2023-02-24 VITALS — Wt 96.0 lb

## 2023-02-24 DIAGNOSIS — Z23 Encounter for immunization: Secondary | ICD-10-CM

## 2023-02-24 DIAGNOSIS — B20 Human immunodeficiency virus [HIV] disease: Secondary | ICD-10-CM

## 2023-02-24 DIAGNOSIS — Z21 Asymptomatic human immunodeficiency virus [HIV] infection status: Secondary | ICD-10-CM

## 2023-02-24 DIAGNOSIS — D251 Intramural leiomyoma of uterus: Secondary | ICD-10-CM

## 2023-02-24 DIAGNOSIS — D500 Iron deficiency anemia secondary to blood loss (chronic): Secondary | ICD-10-CM

## 2023-02-24 MED ORDER — CABOTEGRAVIR & RILPIVIRINE ER 600 & 900 MG/3ML IM SUER
1.0000 | Freq: Once | INTRAMUSCULAR | Status: AC
Start: 2023-02-24 — End: 2023-02-24
  Administered 2023-02-24: 1 via INTRAMUSCULAR

## 2023-02-24 NOTE — Patient Instructions (Signed)
Will check your blood counts again - to make sure you don't need another transfusion.   I will place a referral to have you see gynecology to see what your options are to help fix the bleeding that makes you blood counts drop SO much.   You probably would also benefit from getting back in for iron infusions as well with the hematology clinic.   04/21/2023 is your next dose of cabenuva with our pharmacy team.

## 2023-02-24 NOTE — Progress Notes (Signed)
Name: Shirley White  DOB: Dec 21, 1970 MRN: [id number] PCP: Marcine Matar, MD    Brief Narrative:  Shirley White is a 52 y.o. woman with HIV disease originally diagnosed in 2005. Poor adherence with AIDS qualifying CD4s since 2016 with CD4 nadir 30.  HIV Risk: heterosexual.  History of OIs: esophageal candidiasis.   Previous Regimens: Complera 2011 >> difficulty swallowing regimen Emtriva + Edurant + Viread 2011 through 01-2014 [facility] 2016 Tiviay + Truvada 2018  Biktarvy 2019 Cabenuva 07-2021  Genotypes: 08-2014 - sensitive 11-2016 - sensitive    Subjective:   CC: HIV follow-up care Cabenuva injection   HPI: Doing well - no changes to health.  Still ongoing heavy menses d/t fibroid. Wants to see gynecology for this to know what her options are regarding bleeding management. She thinks her mom had menopause around 4/53 yo. She still gets monthly bleed w/o any changes to schedule.  LMP started 10/16 and due for one soon.   Renaldo Harrison continues to go well.    Review of Systems  Constitutional:  Positive for fatigue.  Genitourinary:  Positive for menstrual problem.     Past Medical History:  Diagnosis Date   Anemia    Cancer of appendix (HCC) 2021   GERD (gastroesophageal reflux disease)    no meds   Heart murmur    HIV disease (HCC)     Outpatient Medications Prior to Visit  Medication Sig Dispense Refill   cabotegravir & rilpivirine ER (CABENUVA) 600 & 900 MG/3ML injection Inject 1 kit into the muscle every 2 (two) months. 6 mL 5   ferrous sulfate 325 (65 FE) MG tablet Take 1 tablet (325 mg total) by mouth daily. 30 tablet 0   fluconazole (DIFLUCAN) 100 MG tablet Take 1 tablet (100 mg total) by mouth daily. 7 tablet 0   No facility-administered medications prior to visit.     No Known Allergies  Social History   Tobacco Use   Smoking status: Never   Smokeless tobacco: Never  Vaping Use   Vaping status: Never Used  Substance Use Topics    Alcohol use: Not Currently    Alcohol/week: 1.0 standard drink of alcohol    Types: 1 Cans of beer per week    Comment: occa   Drug use: Not Currently    Types: Cocaine, Marijuana    Social History   Substance and Sexual Activity  Sexual Activity Not Currently   Partners: Male   Birth control/protection: Condom   Comment: DECLINED CONDOMS 06/2020     Objective:   Vitals:   02/24/23 1605  Weight: 96 lb (43.5 kg)    Body mass index is 20.77 kg/m.   Physical Exam HENT:     Mouth/Throat:     Mouth: Mucous membranes are moist.     Pharynx: Oropharynx is clear.  Eyes:     Pupils: Pupils are equal, round, and reactive to light.  Cardiovascular:     Rate and Rhythm: Normal rate and regular rhythm.  Abdominal:     General: There is no distension.     Tenderness: There is no abdominal tenderness.  Skin:    General: Skin is warm and dry.     Capillary Refill: Capillary refill takes less than 2 seconds.     Coloration: Skin is not pale.  Neurological:     Mental Status: She is oriented to person, place, and time.      Lab Results Lab Results  Component Value Date  WBC 9.4 06/17/2022   HGB 5.4 (LL) 06/17/2022   HCT 20.3 (L) 06/17/2022   MCV 55.8 (L) 06/17/2022   PLT 558 (H) 06/17/2022    Lab Results  Component Value Date   CREATININE 0.50 06/17/2022   BUN 6 06/17/2022   NA 136 06/17/2022   K 4.1 06/17/2022   CL 105 06/17/2022   CO2 22 06/17/2022    Lab Results  Component Value Date   ALT 11 06/17/2022   AST 28 06/17/2022   ALKPHOS 39 06/17/2022   BILITOT 1.1 06/17/2022    Lab Results  Component Value Date   CHOL 193 03/01/2020   HDL 66 03/01/2020   LDLCALC 110 (H) 03/01/2020   TRIG 81 03/01/2020   CHOLHDL 2.9 03/01/2020   HIV 1 RNA Quant (Copies/mL)  Date Value  10/23/2022 Not Detected  04/29/2022 Not Detected  12/25/2021 Not Detected   CD4 T Cell Abs (/uL)  Date Value  04/29/2022 554  12/20/2020 462  03/01/2020 338 (L)      Assessment & Plan:   Problem List Items Addressed This Visit       High   HIV (human immunodeficiency virus infection) (HCC) - Primary (Chronic)    Very well controlled on q25m cabenuva injections. No concerns with access or adherence to medication. They are tolerating the medication well without side effects. No drug interactions identified. Pertinent lab tests ordered today.  No changes to insurance coverage.  No dental needs today.  No concern over anxious/depressed mood.  Sexual health and family planning discussed - pap smear May 2025 - due in May 2028.  Flu shot today CVD risk 2.9% calculated. Defer statin therapy given low risk.  Check Hep B sAb for immunity following previous vaccinations.   04/21/2023 is her next injection appointment for [facility] with TD 11th          Relevant Medications   cabotegravir & rilpivirine ER (CABENUVA) 600 & 900 MG/3ML injection 1 kit   Other Relevant Orders   T-helper cell (CD4)- ([facility] clinic only)   HIV 1 RNA quant-no reflex-bld   T-helper cells (CD4) count   Hepatitis B surface antibody,qualitative     Unprioritized   Iron deficiency anemia    D/T ongoing heavy uterine bleeding 2/2 fibroid.  -referral to GYN today for assistance with fiborid/AUB management -likely needs to resume iron infusions I suspect - will see what her CBC looks like today. She has not tolerated oral iron in the past well.       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

## 2023-02-24 NOTE — Assessment & Plan Note (Signed)
D/T ongoing heavy uterine bleeding 2/2 fibroid.  -referral to GYN today for assistance with fiborid/AUB management -likely needs to resume iron infusions I suspect - will see what her CBC looks like today. She has not tolerated oral iron in the past well.

## 2023-02-24 NOTE — Assessment & Plan Note (Addendum)
Very well controlled on q84m cabenuva injections. No concerns with access or adherence to medication. They are tolerating the medication well without side effects. No drug interactions identified. Pertinent lab tests ordered today.  No changes to insurance coverage.  No dental needs today.  No concern over anxious/depressed mood.  Sexual health and family planning discussed - pap smear May 2025 - due in May 2028.  Flu shot today CVD risk 2.9% calculated. Defer statin therapy given low risk.  Check Hep B sAb for immunity following previous vaccinations.   04/21/2023 is her next injection appointment for [facility] with TD 11th

## 2023-02-25 ENCOUNTER — Telehealth: Payer: Self-pay

## 2023-02-25 ENCOUNTER — Encounter: Payer: Self-pay | Admitting: Oncology

## 2023-02-25 DIAGNOSIS — D500 Iron deficiency anemia secondary to blood loss (chronic): Secondary | ICD-10-CM

## 2023-02-25 LAB — T-HELPER CELLS (CD4) COUNT (NOT AT ARMC)
CD4 % Helper T Cell: 30 % — ABNORMAL LOW (ref 33–65)
CD4 T Cell Abs: 446 /uL (ref 400–1790)

## 2023-02-25 MED ORDER — SULFAMETHOXAZOLE-TRIMETHOPRIM 800-160 MG PO TABS: 1.0000 | ORAL_TABLET | Freq: Two times a day (BID) | ORAL | 0 refills | Status: DC

## 2023-02-25 NOTE — Telephone Encounter (Signed)
Scheduled appointment with short stay.   Relayed appointment info to [facility], she requested it be sent via MyChart.   Sandie Ano, RN

## 2023-02-25 NOTE — Telephone Encounter (Signed)
-----   Message from [place] sent at 02/25/2023  8:09 AM EST ----- Can we call Shirley White to let her know we should get her set up with 2 units of blood before her period starts. Her hemoglobin is only 6.5.   No need to go to the ER - we should be able to set this up with [facility] if I recall the parameters of the hgb level correctly. I will work on putting in orders this afternoon (will need to find some help putting it in).   Can you help with an appt at the short stay infusion [facility] please?   I would also like to get her back in with hematology for iron infusions to help hold her stores better while we get her to see gyn for the bleeding. Will place referral this afternoon.  I know this sounds like a lot but it would really be best for her to not be this anemic no matter how used to it her body feels.

## 2023-02-25 NOTE — Telephone Encounter (Signed)
Order form faxed to short stay.

## 2023-02-25 NOTE — Telephone Encounter (Signed)
Shirley White called back, relayed that hemoglobin is low and [name] would like for her to receive 2 units of blood. Discussed that office is working to set this up with short stay and would give her a call back once appointment has been made.   Relayed that [name] is also placing a referral to hematology for iron infusions to help hold her stores better while we work to have her seen by gynecology for the bleeding. Patient verbalized understanding and has no further questions.   Sandie Ano, RN

## 2023-02-25 NOTE — Progress Notes (Signed)
Can we call Shirley White to let her know we should get her set up with 2 units of blood before her period starts. Her hemoglobin is only 6.5.   No need to go to the ER - we should be able to set this up with infusion clinic [facility] if I recall the parameters of the hgb level correctly. I will work on putting in orders this afternoon (will need to find some help putting it in).   Can you help with an appt at the short stay infusion [facility] please?   I would also like to get her back in with hematology for iron infusions to help hold her stores better while we get her to see gyn for the bleeding. Will place referral this afternoon.  I know this sounds like a lot but it would really be best for her to not be this anemic no matter how used to it her body feels.

## 2023-02-25 NOTE — Telephone Encounter (Signed)
I attempted to reach the patient to advise her of labs and to let her know we will need to get her scheduled for hematology and a blood transfusion. No answer and LVM for her to call back.   Form for short stay placed in your box to sign  Shirley White Shirley White, CMA

## 2023-02-26 ENCOUNTER — Telehealth: Payer: Self-pay | Admitting: *Deleted

## 2023-02-26 ENCOUNTER — Other Ambulatory Visit: Payer: Self-pay

## 2023-02-26 LAB — HIV-1 RNA QUANT-NO REFLEX-BLD
HIV 1 RNA Quant: NOT DETECTED {copies}/mL
HIV-1 RNA Quant, Log: NOT DETECTED {Log_copies}/mL

## 2023-02-26 LAB — CBC
HCT: 24.4 % — ABNORMAL LOW (ref 35.0–45.0)
Hemoglobin: 6.5 g/dL — ABNORMAL LOW (ref 11.7–15.5)
MCH: 15.4 pg — ABNORMAL LOW (ref 27.0–33.0)
MCHC: 26.6 g/dL — ABNORMAL LOW (ref 32.0–36.0)
MCV: 57.7 fL — ABNORMAL LOW (ref 80.0–100.0)
MPV: 10 fL (ref 7.5–12.5)
Platelets: 530 10*3/uL — ABNORMAL HIGH (ref 140–400)
RBC: 4.23 10*6/uL (ref 3.80–5.10)
RDW: 21.5 % — ABNORMAL HIGH (ref 11.0–15.0)
WBC: 17.1 10*3/uL — ABNORMAL HIGH (ref 3.8–10.8)

## 2023-02-26 LAB — HEPATITIS B SURFACE ANTIBODY,QUALITATIVE: Hep B S Ab: NONREACTIVE

## 2023-02-26 MED ORDER — SULFAMETHOXAZOLE-TRIMETHOPRIM 800-160 MG PO TABS: 1.0000 | ORAL_TABLET | Freq: Two times a day (BID) | ORAL | 0 refills | Status: DC

## 2023-02-26 MED ORDER — SULFAMETHOXAZOLE-TRIMETHOPRIM 800-160 MG PO TABS: 1.0000 | ORAL_TABLET | Freq: Two times a day (BID) | ORAL | 0 refills | Status: AC

## 2023-02-26 NOTE — Telephone Encounter (Signed)
Left Message - L/M HIPAA compliant to call regarding referral received

## 2023-02-26 NOTE — Addendum Note (Signed)
Addended by: Blanchard Kelch on: 02/26/2023 09:55 AM   Modules accepted: Orders

## 2023-03-02 ENCOUNTER — Other Ambulatory Visit (HOSPITAL_COMMUNITY): Payer: Self-pay | Admitting: *Deleted

## 2023-03-02 DIAGNOSIS — D649 Anemia, unspecified: Secondary | ICD-10-CM

## 2023-03-03 ENCOUNTER — Ambulatory Visit (HOSPITAL_COMMUNITY)
Admission: RE | Admit: 2023-03-03 | Discharge: 2023-03-03 | Disposition: A | Discharge status: Home / Self Care | Payer: 59 | Source: Ambulatory Visit | Attending: Infectious Diseases | Admitting: Infectious Diseases

## 2023-03-03 DIAGNOSIS — D649 Anemia, unspecified: Secondary | ICD-10-CM | POA: Insufficient documentation

## 2023-03-03 LAB — PREPARE RBC (CROSSMATCH)

## 2023-03-03 MED ORDER — SODIUM CHLORIDE 0.9% IV SOLUTION: Freq: Once | INTRAVENOUS | Status: DC

## 2023-03-04 LAB — TYPE AND SCREEN
ABO/RH(D): B POS
Antibody Screen: NEGATIVE
Unit division: 0
Unit division: 0

## 2023-03-04 LAB — BPAM RBC
Blood Product Expiration Date: 202411292359
Blood Product Expiration Date: 202411292359
ISSUE DATE / TIME: 202411191115
ISSUE DATE / TIME: 202411190917
Unit Type and Rh: 7300
Unit Type and Rh: 7300

## 2023-03-05 ENCOUNTER — Telehealth: Payer: Self-pay | Admitting: *Deleted

## 2023-03-05 NOTE — Telephone Encounter (Signed)
Left HIPAA compliant message again for patient to call in regard to referral received.

## 2023-04-06 ENCOUNTER — Other Ambulatory Visit (HOSPITAL_COMMUNITY): Payer: Self-pay

## 2023-04-17 ENCOUNTER — Ambulatory Visit (INDEPENDENT_AMBULATORY_CARE_PROVIDER_SITE_OTHER): Payer: 59 | Admitting: Obstetrics & Gynecology

## 2023-04-17 ENCOUNTER — Other Ambulatory Visit: Payer: Self-pay

## 2023-04-17 VITALS — BP 124/79 | HR 81 | Wt 101.0 lb

## 2023-04-17 DIAGNOSIS — N939 Abnormal uterine and vaginal bleeding, unspecified: Secondary | ICD-10-CM | POA: Diagnosis not present

## 2023-04-17 DIAGNOSIS — Z1331 Encounter for screening for depression: Secondary | ICD-10-CM | POA: Diagnosis not present

## 2023-04-17 DIAGNOSIS — D259 Leiomyoma of uterus, unspecified: Secondary | ICD-10-CM

## 2023-04-17 MED ORDER — TRANEXAMIC ACID 650 MG PO TABS: 1300.0000 mg | ORAL_TABLET | Freq: Three times a day (TID) | ORAL | 2 refills | Status: AC

## 2023-04-17 MED ORDER — NAPROXEN 250 MG PO TABS
500.0000 mg | ORAL_TABLET | Freq: Two times a day (BID) | ORAL | 2 refills | Status: DC
Start: 2023-04-17 — End: 2023-06-19

## 2023-04-17 NOTE — Progress Notes (Addendum)
 GYNECOLOGY OFFICE VISIT NOTE  History:   Shirley White is a 53 y.o. [id number] with complicated medical history including HTN, adenocarcinoma, fibroids here today for evaluation of abnormal uterine bleeding.  Was seen by her PCP and was told to come to discuss hysterectomy.  Today, patient says she had no periods since October, nature must be taking care of it but did start spotting today.  She wants ultrasound to evaluate her fibroids.   She denies any abnormal vaginal discharge pelvic pain or other concerns.     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   Adenocarcinoma, colon (HCC) 07/20/2019   HIV (human immunodeficiency virus infection) (HCC) 07/14/2018   LGSIL on Pap smear of cervix 04/29/2018   H/O menorrhagia 09/05/2014   Iron  deficiency anemia 09/05/2014   Sickle-cell trait (HCC) 06/04/2006    Past Medical History:  Diagnosis Date   Anemia    Cancer of appendix (HCC) 2021   GERD (gastroesophageal reflux disease)    no meds   Heart murmur    HIV disease (HCC)     Past Surgical History:  Procedure Laterality Date   CESAREAN SECTION     ESOPHAGOGASTRODUODENOSCOPY N/A 02/03/2016   Procedure: ESOPHAGOGASTRODUODENOSCOPY (EGD);  Surgeon: Elspeth Deward Naval, MD;  Location: [facility] END[facility]COPY;  Service: Gastroenterology;  Laterality: N/A;   LAPAROSCOPIC APPENDECTOMY N/A 07/14/2019   Procedure: LAPAROSCOPIC APPENDECTOMY CONVERTED TO OPEN RIGHT HEMICOLECTOMY;  Surgeon: Vernetta Berg, MD;  Location: [facility] OR;  Service: General;  Laterality: N/A;   RADIOACTIVE SEED GUIDED EXCISIONAL BREAST BIOPSY Right 06/23/2018   Procedure: RADIOACTIVE SEED GUIDED EXCISIONAL RIGHT BREAST BIOPSY;  Surgeon: Aron Shoulders, MD;  Location: [facility];  Service: General;  Laterality: Right;    RIGHT COLECTOMY  07/14/2019   at time of appendectomy    The following portions of the patient's history were reviewed and updated as appropriate: allergies, current medications, past family history, past medical history, past social history, past surgical history and problem list.   Health Maintenance:  Normal pap and negative HRHPV on 09/03/2022.  Ordered for mammogram, patient is waiting to be scheduled for this.  Review of Systems:  Pertinent items noted in HPI and remainder of comprehensive ROS otherwise negative.  Physical Exam:  BP 124/79   Pulse 81   Wt 101 lb (45.8 kg)   LMP 01/28/2023 (Exact Date)   BMI 21.86 kg/m  CONSTITUTIONAL: Well-developed, well-nourished female in no acute distress.  HEENT:  Normocephalic, atraumatic. External right and left ear normal. No scleral icterus.  NECK: Normal range of motion, supple, no masses noted on observation SKIN: No rash noted. Not diaphoretic. No erythema. No pallor. MUSCULOSKELETAL: Normal range of motion. No edema noted. NEUROLOGIC: Alert and oriented to person, place, and time. Normal muscle tone coordination. No cranial nerve deficit noted. PSYCHIATRIC: Normal mood and affect. Normal behavior. Normal judgment and thought content. CARDIOVASCULAR: Normal heart rate noted RESPIRATORY: Effort and breath sounds normal, no problems with respiration noted ABDOMEN: No masses noted. No other overt distention noted.   PELVIC: Deferred     Assessment and Plan:     1. Uterine leiomyoma, unspecified location Ultrasound ordered, will follow up results and manage accordingly. - US  PELVIC COMPLETE WITH TRANSVAGINAL; Future  2. Abnormal uterine bleeding (AUB) (Primary) Discussed perimenopausal AUB, heavy bleeding precautions reviewed.  Will get pelvic ultrasound as mentioned above.  Discussed management options, patient was given Lysteda  and Naproxen  to help with any heavy bleeding for now.  If heavy bleeding/AUB continues, discussed need  for endometrial biopsy prior to making any decisions about surgical management, this procedure was discussed in detail. Also discussed that there are other management options (medical, minimally invasive surgical options)  for AUB that can be discussed that could help in managing her symptoms until she is in menopause. Hysterectomy is the most definitive option, also most invasive.  She was told to call if heavy bleeding/AUB recurs.  - US  PELVIC COMPLETE WITH TRANSVAGINAL; Future - tranexamic acid  (LYSTEDA ) 650 MG TABS tablet; Take 2 tablets (1,300 mg total) by mouth 3 (three) times daily. Take during menses for a maximum of five days  Dispense: 30 tablet; Refill: 2 - naproxen  (NAPROSYN ) 250 MG tablet; Take 2 tablets (500 mg total) by mouth 2 (two) times daily with a meal.  Dispense: 60 tablet; Refill: 2  Routine preventative health maintenance measures emphasized. We will help schedule her mammogram. Please refer to After Visit Summary for other counseling recommendations.   Return for any gynecologic concerns.    I spent  45  minutes dedicated to the care of this patient including pre-visit review of records, face to face time with the patient discussing her conditions and treatments and post visit orders.    GLORIS HUGGER, MD, FACOG Obstetrician & Gynecologist, [facility] for [employer], [facility]

## 2023-04-17 NOTE — Progress Notes (Signed)
 Called patient and verified patient by full name and DOB. Notified patient that US  was scheduled for 1/24 at 10:00 AM at [facility]. Informed patient to arrive at 9:45 AM with a full bladder and she will be able to see the appointment schedule through her Mychart. Patient confirmed scheduled appointment.  Rosaline, RN

## 2023-04-21 ENCOUNTER — Ambulatory Visit: Payer: Self-pay | Admitting: Pharmacist

## 2023-04-21 ENCOUNTER — Ambulatory Visit: Payer: 59 | Admitting: Pharmacist

## 2023-04-27 NOTE — Progress Notes (Signed)
 HPI: Shirley White is a 53 y.o. female who presents to the [facility] pharmacy clinic for Cabenuva  administration.  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   FLUCONAZOLE  (DIFLUCAN ) 100 MG TABLET    Take 1 tablet (100 mg total) by mouth daily.   NAPROXEN  (NAPROSYN ) 250 MG TABLET    Take 2 tablets (500 mg total) by mouth 2 (two) times daily with a meal.   TRANEXAMIC ACID  (LYSTEDA ) 650 MG TABS TABLET    Take 2 tablets (1,300 mg total) by mouth 3 (three) times daily. Take during menses for a maximum of five days  Modified Medications   No medications on file  Discontinued Medications   No medications on file    Allergies: No Known Allergies  Labs: Lab Results  Component Value Date   HIV1RNAQUANT Not Detected 02/24/2023   HIV1RNAQUANT Not Detected 10/23/2022   HIV1RNAQUANT Not Detected 04/29/2022   CD4TABS 446 02/24/2023   CD4TABS 554 04/29/2022   CD4TABS 462 12/20/2020    RPR and STI Lab Results  Component Value Date   LABRPR NON-REACTIVE 12/20/2020   LABRPR NON-REACTIVE 06/22/2018   LABRPR NON REAC 08/21/2015   LABRPR NON REAC 09/05/2014   LABRPR NON REAC 09/24/2009    STI Results GC CT  04/29/2018 12:00 AM Negative  Negative   03/22/2018 12:00 AM Negative  Negative   08/21/2015 12:00 AM Negative  Negative    09/05/2014 12:00 AM Negative  Negative     Hepatitis B Lab Results  Component Value Date   HEPBSAB NON-REACTIVE 02/24/2023   HEPBSAG NEGATIVE 09/05/2014   HEPBCAB NON REACTIVE 09/05/2014   Hepatitis C No results found for: HEPCAB, HCVRNAPCRQN Hepatitis A Lab Results  Component Value Date   HAV REACTIVE (A) 09/05/2014   Lipids: Lab Results  Component Value Date   CHOL 193 03/01/2020   TRIG 81 03/01/2020   HDL 66 03/01/2020   CHOLHDL 2.9 03/01/2020   VLDL 12 08/21/2015   LDLCALC 110 (H) 03/01/2020    TARGET DATE: The 11th of the month  Assessment: Shirley White presents today for her maintenance Cabenuva  injections. Past injections were tolerated well without issues. Last HIV RNA was undetectable in November, seen by Corean Fireman, NP.   Immunizations: Due for COVID, Menveo booster (last dose 03/2018), HBV revaccination (negative Ab 2024), and Shingles. She declines these, but will consider with next appointment with Corean.  Administered cabotegravir  600mg /84mL in left upper outer quadrant of the gluteal muscle. Administered rilpivirine  900 mg/3mL in the right upper outer quadrant of the gluteal muscle. No issues with injections. Shirley White will follow up in 2 months for next set of injections.  Plan: - Cabenuva  injections administered - Next injections scheduled for 06/19/23 with Corean Fireman, NP - Call with any issues or questions  Con Laughter, PharmD PGY-2 Infectious Diseases Pharmacy Resident  [facility]

## 2023-04-28 ENCOUNTER — Ambulatory Visit (INDEPENDENT_AMBULATORY_CARE_PROVIDER_SITE_OTHER): Payer: 59 | Admitting: Pharmacist

## 2023-04-28 ENCOUNTER — Other Ambulatory Visit: Payer: Self-pay

## 2023-04-28 DIAGNOSIS — Z113 Encounter for screening for infections with a predominantly sexual mode of transmission: Secondary | ICD-10-CM

## 2023-04-28 DIAGNOSIS — Z2981 Encounter for HIV pre-exposure prophylaxis: Secondary | ICD-10-CM

## 2023-04-28 DIAGNOSIS — B20 Human immunodeficiency virus [HIV] disease: Secondary | ICD-10-CM | POA: Diagnosis not present

## 2023-04-28 DIAGNOSIS — Z21 Asymptomatic human immunodeficiency virus [HIV] infection status: Secondary | ICD-10-CM

## 2023-04-28 MED ORDER — CABOTEGRAVIR & RILPIVIRINE ER 600 & 900 MG/3ML IM SUER
1.0000 | Freq: Once | INTRAMUSCULAR | Status: AC
  Administered 2023-04-28: 1 via INTRAMUSCULAR

## 2023-04-29 ENCOUNTER — Telehealth: Payer: Self-pay

## 2023-04-29 NOTE — Telephone Encounter (Signed)
 Left voicemail to scheduled [facility] pharmacy visit for Cabenuva  administration in May 2025, as I did not schedule at appointment yesterday. Will need to see within 7 days of target date of May 11th 2025. Will follow up again this week.

## 2023-04-29 NOTE — Telephone Encounter (Signed)
 Thanks

## 2023-04-30 NOTE — Progress Notes (Signed)
Spoke with patient, scheduled [facility] pharmacy visit for May

## 2023-05-08 ENCOUNTER — Ambulatory Visit (HOSPITAL_COMMUNITY)
Admission: RE | Admit: 2023-05-08 | Discharge: 2023-05-08 | Disposition: A | Discharge status: Home / Self Care | Payer: 59 | Source: Ambulatory Visit | Attending: Obstetrics & Gynecology | Admitting: Obstetrics & Gynecology

## 2023-05-08 ENCOUNTER — Other Ambulatory Visit: Payer: Self-pay | Admitting: Obstetrics & Gynecology

## 2023-05-08 DIAGNOSIS — D259 Leiomyoma of uterus, unspecified: Secondary | ICD-10-CM

## 2023-05-08 DIAGNOSIS — N939 Abnormal uterine and vaginal bleeding, unspecified: Secondary | ICD-10-CM

## 2023-05-08 DIAGNOSIS — N854 Malposition of uterus: Secondary | ICD-10-CM | POA: Diagnosis not present

## 2023-05-13 ENCOUNTER — Encounter: Payer: Self-pay | Admitting: Obstetrics & Gynecology

## 2023-05-20 ENCOUNTER — Ambulatory Visit
Admission: RE | Admit: 2023-05-20 | Discharge: 2023-05-20 | Disposition: A | Discharge status: Home / Self Care | Payer: 59 | Source: Ambulatory Visit | Attending: Obstetrics and Gynecology | Admitting: Obstetrics and Gynecology

## 2023-05-20 ENCOUNTER — Other Ambulatory Visit: Payer: Self-pay | Admitting: Obstetrics and Gynecology

## 2023-05-20 ENCOUNTER — Other Ambulatory Visit (HOSPITAL_COMMUNITY): Payer: Self-pay

## 2023-05-20 ENCOUNTER — Encounter: Payer: Self-pay | Admitting: Oncology

## 2023-05-20 DIAGNOSIS — R921 Mammographic calcification found on diagnostic imaging of breast: Secondary | ICD-10-CM

## 2023-05-20 DIAGNOSIS — N6310 Unspecified lump in the right breast, unspecified quadrant: Secondary | ICD-10-CM

## 2023-05-20 DIAGNOSIS — N6452 Nipple discharge: Secondary | ICD-10-CM

## 2023-05-20 DIAGNOSIS — N6320 Unspecified lump in the left breast, unspecified quadrant: Secondary | ICD-10-CM

## 2023-05-26 ENCOUNTER — Other Ambulatory Visit: Payer: 59

## 2023-06-15 ENCOUNTER — Other Ambulatory Visit: Payer: 59

## 2023-06-17 ENCOUNTER — Other Ambulatory Visit (HOSPITAL_COMMUNITY): Payer: Self-pay

## 2023-06-19 ENCOUNTER — Encounter: Payer: Self-pay | Admitting: Infectious Diseases

## 2023-06-19 ENCOUNTER — Other Ambulatory Visit: Payer: Self-pay

## 2023-06-19 ENCOUNTER — Ambulatory Visit (INDEPENDENT_AMBULATORY_CARE_PROVIDER_SITE_OTHER): Payer: 59 | Admitting: Infectious Diseases

## 2023-06-19 VITALS — BP 127/80 | HR 83 | Temp 96.4°F | Ht 61.0 in | Wt 109.0 lb

## 2023-06-19 DIAGNOSIS — D649 Anemia, unspecified: Secondary | ICD-10-CM

## 2023-06-19 DIAGNOSIS — Z113 Encounter for screening for infections with a predominantly sexual mode of transmission: Secondary | ICD-10-CM

## 2023-06-19 DIAGNOSIS — Z780 Asymptomatic menopausal state: Secondary | ICD-10-CM

## 2023-06-19 DIAGNOSIS — B20 Human immunodeficiency virus [HIV] disease: Secondary | ICD-10-CM | POA: Diagnosis not present

## 2023-06-19 DIAGNOSIS — Z79899 Other long term (current) drug therapy: Secondary | ICD-10-CM

## 2023-06-19 MED ORDER — CABOTEGRAVIR & RILPIVIRINE ER 600 & 900 MG/3ML IM SUER
1.0000 | Freq: Once | INTRAMUSCULAR | Status: AC
  Administered 2023-06-19: 1 via INTRAMUSCULAR

## 2023-06-19 NOTE — Progress Notes (Signed)
 Name: Shirley White  DOB: Dec 14, 1970 MRN: [id number] PCP: Marcine Matar, MD    Brief Narrative:  Shirley White is a 53 y.o. woman with HIV disease originally diagnosed in 2005. Poor adherence with AIDS qualifying CD4s since 2016 with CD4 nadir 30.  HIV Risk: heterosexual.  History of OIs: esophageal candidiasis.   Previous Regimens: Complera 2011 >> difficulty swallowing regimen Emtriva + Edurant + Viread 2011 through 01-2014 [facility] 2016 Tiviay + Truvada 2018  Biktarvy 2019 Cabenuva 07-2021  Genotypes: 08-2014 - sensitive 11-2016 - sensitive    Subjective:   CC: HIV follow-up care Cabenuva injection    Discussed the use of AI scribe software for clinical note transcription with the patient, who gave verbal consent to proceed.  History of Present Illness   Shirley White is here today for HIV follow up care.   There have been no significant changes her last visit.  She was prescribed medication for heavy menstrual bleeding but did not fill the prescription as she is transitioning through menopause, which has reduced her bleeding. She has not had a menstrual period for two months and her symptoms are manageable without medication.  She experiences menopause-related symptoms such as hot flashes and mood changes but describes them as 'not too bad.'  She has a history of anemia requiring blood transfusions, with the last transfusion occurring in November. The reduction in menstrual bleeding due to menopause is expected to decrease the need for future transfusions. She has not scheduled any follow-ups for potential future transfusions.  She is focused on maintaining kidney health, influenced by her mother's recent kidney issues. She is drinking plenty of water and has reduced her intake of Mountain Dew to support her kidney health. Her mother recently experienced kidney failure, requiring hospitalization, but is now recovering at home.  She is currently on Cabenuva, which  she notes has no impact on her kidneys. She has been doing well on this medication for several years.       Review of Systems  Constitutional:  Negative for fatigue.  Genitourinary:  Negative for menstrual problem.  All other systems reviewed and are negative.    Past Medical History:  Diagnosis Date   Anemia    Cancer of appendix (HCC) 2021   GERD (gastroesophageal reflux disease)    no meds   Heart murmur    HIV disease (HCC)     Outpatient Medications Prior to Visit  Medication Sig Dispense Refill   cabotegravir & rilpivirine ER (CABENUVA) 600 & 900 MG/3ML injection Inject 1 kit into the muscle every 2 (two) months. 6 mL 5   fluconazole (DIFLUCAN) 100 MG tablet Take 1 tablet (100 mg total) by mouth daily. 7 tablet 0   naproxen (NAPROSYN) 250 MG tablet take 2 tablets (500 mg total) by mouth 2 (two) times daily with a meal     tranexamic acid (LYSTEDA) 650 MG TABS tablet Take 2 tablets (1,300 mg total) by mouth 3 (three) times daily. Take during menses for a maximum of five days 30 tablet 2   naproxen (NAPROSYN) 250 MG tablet Take 2 tablets (500 mg total) by mouth 2 (two) times daily with a meal. (Patient not taking: Reported on 06/19/2023) 60 tablet 2   No facility-administered medications prior to visit.     No Known Allergies  Social History   Tobacco Use   Smoking status: Never   Smokeless tobacco: Never  Vaping Use   Vaping status: Never Used  Substance Use Topics  Alcohol use: Not Currently    Alcohol/week: 1.0 standard drink of alcohol    Types: 1 Cans of beer per week    Comment: occa   Drug use: Not Currently    Types: Cocaine, Marijuana    Social History   Substance and Sexual Activity  Sexual Activity Not Currently   Partners: Male   Birth control/protection: Condom   Comment: DECLINED CONDOMS     Objective:   Vitals:   06/19/23 1003  BP: 127/80  Pulse: 83  Temp: (!) 96.4 F (35.8 C)  TempSrc: Temporal  Weight: 109 lb (49.4 kg)  Height:  5\' 1"  (1.549 m)    Body mass index is 20.6 kg/m.   Physical Exam HENT:     Mouth/Throat:     Mouth: Mucous membranes are moist.     Pharynx: Oropharynx is clear.  Eyes:     Pupils: Pupils are equal, round, and reactive to light.  Cardiovascular:     Rate and Rhythm: Normal rate and regular rhythm.  Abdominal:     General: There is no distension.     Tenderness: There is no abdominal tenderness.  Skin:    General: Skin is warm and dry.     Capillary Refill: Capillary refill takes less than 2 seconds.     Coloration: Skin is not pale.  Neurological:     Mental Status: She is oriented to person, place, and time.      Lab Results Lab Results  Component Value Date   WBC 4.9 06/19/2023   HGB 10.5 (L) 06/19/2023   HCT 34.0 (L) 06/19/2023   MCV 73.1 (L) 06/19/2023   PLT 402 (H) 06/19/2023    Lab Results  Component Value Date   CREATININE 0.54 06/19/2023   BUN 13 06/19/2023   NA 141 06/19/2023   K 4.2 06/19/2023   CL 105 06/19/2023   CO2 26 06/19/2023    Lab Results  Component Value Date   ALT 9 06/19/2023   AST 13 06/19/2023   ALKPHOS 39 06/17/2022   BILITOT 0.8 06/19/2023    Lab Results  Component Value Date   CHOL 218 (H) 06/19/2023   HDL 87 06/19/2023   LDLCALC 116 (H) 06/19/2023   TRIG 61 06/19/2023   CHOLHDL 2.5 06/19/2023   HIV 1 RNA Quant (Copies/mL)  Date Value  02/24/2023 Not Detected  10/23/2022 Not Detected  04/29/2022 Not Detected   CD4 T Cell Abs (/uL)  Date Value  02/24/2023 446  04/29/2022 554  12/20/2020 462     Assessment & Plan:     HIV Management - On Cabenuva, well-managed for years now. Cabenuva administered today. She is doing well. Sexual health screenings declined today as she needs to get to work. Will discuss pap smear and mammography screenings at upcoming visit with me in 33m .  - Continue Cabenuva as scheduled. - Monitor renal function as needed.  Perimenopause -  Symptoms consistent with menopausal transition.  Amenorrhea for two months. Discussed hormone replacement therapy options and to schedule an appt with GYN team to discuss options.  - Consider hormone replacement therapy if symptoms worsen. - Schedule follow-up with gynecology if needed.  Anemia - Anemia requiring transfusions. Menopausal transition may reduce future transfusion need. Advised symptom monitoring and hydration. - Monitor hemoglobin levels. - Encourage hydration.  Follow-up Follow-up scheduled for May 13th for [place]. Cruise in August will not affect treatment schedule.      Meds ordered this encounter  Medications  cabotegravir & rilpivirine ER (CABENUVA) 600 & 900 MG/3ML injection 1 kit   Orders Placed This Encounter  Procedures   HIV-1 RNA quant-no reflex-bld   T-helper cells (CD4) count (not at [facility])   CBC with Differential/Platelet   COMPLETE METABOLIC PANEL WITH GFR   RPR   Lipid panel   08/25/2023 next cabenuva appt.    Rexene Alberts, MSN, NP-C [facility] for Infectious Disease [facility] Pager: [phone] Office: [phone]  06/22/23  4:34 PM

## 2023-06-24 ENCOUNTER — Ambulatory Visit: Payer: 59 | Admitting: Pharmacist

## 2023-06-24 LAB — LIPID PANEL
Cholesterol: 218 mg/dL — ABNORMAL HIGH (ref <200)
HDL: 87 mg/dL (ref >=50)
LDL Cholesterol (Calc): 116 mg/dL — ABNORMAL HIGH
Non-HDL Cholesterol (Calc): 131 mg/dL — ABNORMAL HIGH (ref <130)
Total CHOL/HDL Ratio: 2.5 (calc) (ref <5.0)
Triglycerides: 61 mg/dL (ref <150)

## 2023-06-24 LAB — CBC WITH DIFFERENTIAL/PLATELET
Absolute Lymphocytes: 1499 {cells}/uL (ref 850–3900)
Absolute Monocytes: 500 {cells}/uL (ref 200–950)
Basophils Absolute: 108 {cells}/uL (ref 0–200)
Basophils Relative: 2.2 %
Eosinophils Absolute: 132 {cells}/uL (ref 15–500)
Eosinophils Relative: 2.7 %
HCT: 34 % — ABNORMAL LOW (ref 35.0–45.0)
Hemoglobin: 10.5 g/dL — ABNORMAL LOW (ref 11.7–15.5)
MCH: 22.6 pg — ABNORMAL LOW (ref 27.0–33.0)
MCHC: 30.9 g/dL — ABNORMAL LOW (ref 32.0–36.0)
MCV: 73.1 fL — ABNORMAL LOW (ref 80.0–100.0)
MPV: 10.2 fL (ref 7.5–12.5)
Monocytes Relative: 10.2 %
Neutro Abs: 2661 {cells}/uL (ref 1500–7800)
Neutrophils Relative %: 54.3 %
Platelets: 402 10*3/uL — ABNORMAL HIGH (ref 140–400)
RBC: 4.65 10*6/uL (ref 3.80–5.10)
RDW: 18 % — ABNORMAL HIGH (ref 11.0–15.0)
Total Lymphocyte: 30.6 %
WBC: 4.9 10*3/uL (ref 3.8–10.8)

## 2023-06-24 LAB — COMPLETE METABOLIC PANEL WITH GFR
AG Ratio: 1 (calc) (ref 1.0–2.5)
ALT: 9 U/L (ref 6–29)
AST: 13 U/L (ref 10–35)
Albumin: 4 g/dL (ref 3.6–5.1)
Alkaline phosphatase (APISO): 56 U/L (ref 37–153)
BUN: 13 mg/dL (ref 7–25)
CO2: 26 mmol/L (ref 20–32)
Calcium: 9.6 mg/dL (ref 8.6–10.4)
Chloride: 105 mmol/L (ref 98–110)
Creat: 0.54 mg/dL (ref 0.50–1.03)
Globulin: 3.9 g/dL — ABNORMAL HIGH (ref 1.9–3.7)
Glucose, Bld: 75 mg/dL (ref 65–99)
Potassium: 4.2 mmol/L (ref 3.5–5.3)
Sodium: 141 mmol/L (ref 135–146)
Total Bilirubin: 0.8 mg/dL (ref 0.2–1.2)
Total Protein: 7.9 g/dL (ref 6.1–8.1)
eGFR: 111 mL/min/{1.73_m2} (ref >=60)

## 2023-06-24 LAB — T-HELPER CELLS (CD4) COUNT (NOT AT ARMC)
Absolute CD4: 443 {cells}/uL — ABNORMAL LOW (ref 490–1740)
CD4 T Helper %: 32 % (ref 30–61)
Total lymphocyte count: 1381 {cells}/uL (ref 850–3900)

## 2023-06-24 LAB — HIV-1 RNA QUANT-NO REFLEX-BLD
HIV 1 RNA Quant: NOT DETECTED {copies}/mL
HIV-1 RNA Quant, Log: NOT DETECTED {Log_copies}/mL

## 2023-06-24 LAB — RPR: RPR Ser Ql: NONREACTIVE

## 2023-06-25 ENCOUNTER — Other Ambulatory Visit (HOSPITAL_COMMUNITY): Payer: Self-pay

## 2023-06-30 ENCOUNTER — Ambulatory Visit
Admission: RE | Admit: 2023-06-30 | Discharge: 2023-06-30 | Disposition: A | Discharge status: Home / Self Care | Source: Ambulatory Visit | Attending: Obstetrics and Gynecology | Admitting: Obstetrics and Gynecology

## 2023-06-30 ENCOUNTER — Encounter: Payer: Self-pay | Admitting: Oncology

## 2023-06-30 DIAGNOSIS — N6342 Unspecified lump in left breast, subareolar: Secondary | ICD-10-CM

## 2023-06-30 DIAGNOSIS — N6320 Unspecified lump in the left breast, unspecified quadrant: Secondary | ICD-10-CM

## 2023-06-30 DIAGNOSIS — N6310 Unspecified lump in the right breast, unspecified quadrant: Secondary | ICD-10-CM

## 2023-06-30 DIAGNOSIS — R921 Mammographic calcification found on diagnostic imaging of breast: Secondary | ICD-10-CM

## 2023-06-30 HISTORY — PX: BREAST BIOPSY: SHX20

## 2023-07-01 ENCOUNTER — Encounter

## 2023-07-01 LAB — SURGICAL PATHOLOGY

## 2023-07-07 ENCOUNTER — Encounter: Payer: Self-pay | Admitting: Oncology

## 2023-07-13 ENCOUNTER — Encounter

## 2023-08-14 ENCOUNTER — Encounter: Payer: Self-pay | Admitting: Oncology

## 2023-08-24 NOTE — Progress Notes (Unsigned)
   HPI: Shirley White is a 53 y.o. female who presents to the [facility] pharmacy clinic for Cabenuva  administration.  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   FLUCONAZOLE  (DIFLUCAN ) 100 MG TABLET    Take 1 tablet (100 mg total) by mouth daily.   NAPROXEN  (NAPROSYN ) 250 MG TABLET    take 2 tablets (500 mg total) by mouth 2 (two) times daily with a meal   TRANEXAMIC ACID  (LYSTEDA ) 650 MG TABS TABLET    Take 2 tablets (1,300 mg total) by mouth 3 (three) times daily. Take during menses for a maximum of five days  Modified Medications   No medications on file  Discontinued Medications   No medications on file    Allergies: No Known Allergies  Labs: Lab Results  Component Value Date   HIV1RNAQUANT Not Detected 06/19/2023   HIV1RNAQUANT Not Detected 02/24/2023   HIV1RNAQUANT Not Detected 10/23/2022   CD4TABS 446 02/24/2023   CD4TABS 554 04/29/2022   CD4TABS 462 12/20/2020    RPR and STI Lab Results  Component Value Date   LABRPR NON-REACTIVE 06/19/2023   LABRPR NON-REACTIVE 12/20/2020   LABRPR NON-REACTIVE 06/22/2018   LABRPR NON REAC 08/21/2015   LABRPR NON REAC 09/05/2014    STI Results GC CT  04/29/2018 12:00 AM Negative  Negative   03/22/2018 12:00 AM Negative  Negative   08/21/2015 12:00 AM Negative  Negative    09/05/2014 12:00 AM Negative  Negative     Hepatitis B Lab Results  Component Value Date   HEPBSAB NON-REACTIVE 02/24/2023   HEPBSAG NEGATIVE 09/05/2014   HEPBCAB NON REACTIVE 09/05/2014   Hepatitis C No results found for: "HEPCAB", "HCVRNAPCRQN" Hepatitis A Lab Results  Component Value Date   HAV REACTIVE (A) 09/05/2014   Lipids: Lab Results  Component Value Date   CHOL 218 (H) 06/19/2023   TRIG 61 06/19/2023   HDL 87 06/19/2023   CHOLHDL 2.5 06/19/2023   VLDL 12 08/21/2015   LDLCALC 116 (H) 06/19/2023    TARGET DATE: 11th of the month  Assessment: Shirley White presents today for her maintenance Cabenuva  injections. Past injections were tolerated well without issues. Last HIV RNA was undetectable in March. Doing well with no issues today.  Administered cabotegravir  600mg /24mL in left upper outer quadrant of the gluteal muscle. Administered rilpivirine  900 mg/3mL in the right upper outer quadrant of the gluteal muscle. No issues with injections. She will follow up in 2 months for next set of injections.  Plan: - Cabenuva  injections administered - Next injections scheduled for *** - Call with any issues or questions   Valarie Garner, PharmD PGY1 Pharmacy Resident

## 2023-08-25 ENCOUNTER — Other Ambulatory Visit: Payer: Self-pay

## 2023-08-25 ENCOUNTER — Ambulatory Visit (INDEPENDENT_AMBULATORY_CARE_PROVIDER_SITE_OTHER): Payer: 59 | Admitting: Pharmacist

## 2023-08-25 DIAGNOSIS — Z113 Encounter for screening for infections with a predominantly sexual mode of transmission: Secondary | ICD-10-CM

## 2023-08-25 DIAGNOSIS — B20 Human immunodeficiency virus [HIV] disease: Secondary | ICD-10-CM

## 2023-08-25 MED ORDER — CABOTEGRAVIR & RILPIVIRINE ER 600 & 900 MG/3ML IM SUER
1.0000 | Freq: Once | INTRAMUSCULAR | Status: AC
  Administered 2023-08-25: 1 via INTRAMUSCULAR

## 2023-08-26 NOTE — Progress Notes (Signed)
 The 10-year ASCVD risk score (Arnett DK, et al., 2019) is: 1.2%   Values used to calculate the score:     Age: 53 years     Sex: Female     Is Non-Hispanic African American: Yes     Diabetic: No     Tobacco smoker: No     Systolic Blood Pressure: 127 mmHg     Is BP treated: No     HDL Cholesterol: 87 mg/dL     Total Cholesterol: 218 mg/dL  Arlon Bergamo, BSN, RN

## 2023-09-29 ENCOUNTER — Other Ambulatory Visit (HOSPITAL_COMMUNITY): Payer: Self-pay

## 2023-09-29 ENCOUNTER — Other Ambulatory Visit: Payer: Self-pay | Admitting: Pharmacist

## 2023-09-29 DIAGNOSIS — Z21 Asymptomatic human immunodeficiency virus [HIV] infection status: Secondary | ICD-10-CM

## 2023-09-29 MED ORDER — CABOTEGRAVIR & RILPIVIRINE ER 600 & 900 MG/3ML IM SUER: 1.0000 | INTRAMUSCULAR | 5 refills | Status: AC

## 2023-10-06 ENCOUNTER — Telehealth: Payer: Self-pay

## 2023-10-06 NOTE — Telephone Encounter (Signed)
 [facility] Patient Advocate Encounter  Patient's medications Cabenuva  have been couriered to [facility] from [employer] and will be administered at the patients appointment on 08/20/23.  Shirley White, CPhT Specialty Pharmacy Patient [facility] for Infectious Disease Phone: [phone] Fax:  [phone]

## 2023-10-16 NOTE — Progress Notes (Signed)
 HPI: Shirley White is a 53 y.o. female who presents to the [facility] pharmacy clinic for Cabenuva  administration.  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   FLUCONAZOLE  (DIFLUCAN ) 100 MG TABLET    Take 1 tablet (100 mg total) by mouth daily.   NAPROXEN  (NAPROSYN ) 250 MG TABLET    take 2 tablets (500 mg total) by mouth 2 (two) times daily with a meal   TRANEXAMIC ACID  (LYSTEDA ) 650 MG TABS TABLET    Take 2 tablets (1,300 mg total) by mouth 3 (three) times daily. Take during menses for a maximum of five days  Modified Medications   No medications on file  Discontinued Medications   No medications on file    Allergies: No Known Allergies  Past Medical History: Past Medical History:  Diagnosis Date   Anemia    Cancer of appendix (HCC) 2021   GERD (gastroesophageal reflux disease)    no meds   Heart murmur    HIV disease (HCC)     Social History: Social History   Socioeconomic History   Marital status: Single    Spouse name: Not on file   Number of children: 4   Years of education: Not on file   Highest education level: 12th grade  Occupational History   Occupation: Conservation officer, nature at [employer]  Tobacco Use   Smoking status: Never   Smokeless tobacco: Never  Vaping Use   Vaping status: Never Used  Substance and Sexual  Activity   Alcohol use: Not Currently    Alcohol/week: 1.0 standard drink of alcohol    Types: 1 Cans of beer per week    Comment: occa   Drug use: Not Currently    Types: Cocaine, Marijuana   Sexual activity: Not Currently    Partners: Male    Birth control/protection: Condom    Comment: DECLINED CONDOMS  Other Topics Concern   Not on file  Social History Narrative   Patient lives alone   Patient has to take public transportation   Social Drivers of Health   Financial Resource Strain: Not on file  Food Insecurity: No Food Insecurity (04/17/2023)   Hunger Vital Sign    Worried About Running Out of Food in the Last Year: Never true    Ran Out of Food in the Last Year: Never true  Transportation Needs: No Transportation Needs (04/17/2023)   PRAPARE - Administrator, Civil Service (Medical): No    Lack of Transportation (Non-Medical): No  Physical Activity: Not on file  Stress: Not on file  Social Connections: Not on file    Labs: Lab Results  Component Value Date   HIV1RNAQUANT Not Detected 06/19/2023   HIV1RNAQUANT Not Detected 02/24/2023   HIV1RNAQUANT Not Detected 10/23/2022   CD4TABS 446 02/24/2023   CD4TABS 554 04/29/2022  CD4TABS 462 12/20/2020    RPR and STI Lab Results  Component Value Date   LABRPR NON-REACTIVE 06/19/2023   LABRPR NON-REACTIVE 12/20/2020   LABRPR NON-REACTIVE 06/22/2018   LABRPR NON REAC 08/21/2015   LABRPR NON REAC 09/05/2014    STI Results GC CT  04/29/2018 12:00 AM Negative  Negative   03/22/2018 12:00 AM Negative  Negative   08/21/2015 12:00 AM Negative  Negative   09/05/2014 12:00 AM Negative  Negative     Hepatitis B Lab Results  Component Value Date   HEPBSAB NON-REACTIVE 02/24/2023   HEPBSAG NEGATIVE 09/05/2014   HEPBCAB NON REACTIVE 09/05/2014   Hepatitis C No results found for: HEPCAB, HCVRNAPCRQN Hepatitis A Lab Results  Component Value Date   HAV REACTIVE (A) 09/05/2014   Lipids: Lab Results   Component Value Date   CHOL 218 (H) 06/19/2023   TRIG 61 06/19/2023   HDL 87 06/19/2023   CHOLHDL 2.5 06/19/2023   VLDL 12 08/21/2015   LDLCALC 116 (H) 06/19/2023    TARGET DATE:  The 11th of the month  Assessment: Shirley White presents today for their maintenance Cabenuva  injections. Initial/past injections were tolerated well without issues. No problems with systemic effects of injections.   Administered cabotegravir  600mg /41mL in left upper outer quadrant of the gluteal muscle. Administered rilpivirine  900 mg/3mL in the right upper outer quadrant of the gluteal muscle. Monitored patient for 10 minutes after injection. Injections were tolerated well without issue. Patient will follow up in 2 months for next injection. Will check HIV RNA today.  Eligible for HBV, Menveo, and Shingrix vaccines which she declines today. She adamantly refuses the Shingles vaccine though we discussed the potential benefits outweigh the risk of moderate side effects following administration. She would like to receive the HBV series and Menveo booster vaccine at next visit with [facility].   Plan: - Cabenuva  injections administered - Check HIV RNA - Next injections scheduled for 9/16 with Corean and 11/4 with me  - Call with any issues or questions  Alan Geralds, PharmD, CPP, BCIDP, AAHIVP Clinical Pharmacist Practitioner Infectious Diseases Clinical Pharmacist [facility]

## 2023-10-20 ENCOUNTER — Other Ambulatory Visit: Payer: Self-pay

## 2023-10-20 ENCOUNTER — Ambulatory Visit (INDEPENDENT_AMBULATORY_CARE_PROVIDER_SITE_OTHER): Payer: Self-pay | Admitting: Pharmacist

## 2023-10-20 ENCOUNTER — Ambulatory Visit: Payer: Self-pay

## 2023-10-20 DIAGNOSIS — Z21 Asymptomatic human immunodeficiency virus [HIV] infection status: Secondary | ICD-10-CM

## 2023-10-20 MED ORDER — CABOTEGRAVIR & RILPIVIRINE ER 600 & 900 MG/3ML IM SUER
1.0000 | Freq: Once | INTRAMUSCULAR | Status: AC
  Administered 2023-10-20: 1 via INTRAMUSCULAR

## 2023-10-22 LAB — HIV-1 RNA QUANT-NO REFLEX-BLD
HIV 1 RNA Quant: NOT DETECTED {copies}/mL
HIV-1 RNA Quant, Log: NOT DETECTED {Log_copies}/mL

## 2023-12-24 ENCOUNTER — Telehealth: Payer: Self-pay

## 2023-12-24 NOTE — Telephone Encounter (Signed)
 [facility] Patient Advocate Encounter  Patient's medications CABENUVA  have been couriered to [facility] from [employer] and will be administered at the patients appointment on 12/29/23.  Charmaine Sharps, CPhT Specialty Pharmacy Patient [facility] for Infectious Disease Phone: [phone] Fax:  [phone]

## 2023-12-29 ENCOUNTER — Other Ambulatory Visit: Payer: Self-pay

## 2023-12-29 ENCOUNTER — Encounter: Payer: Self-pay | Admitting: Infectious Diseases

## 2023-12-29 ENCOUNTER — Ambulatory Visit: Payer: Self-pay | Admitting: Infectious Diseases

## 2023-12-29 VITALS — BP 152/85 | HR 74 | Temp 98.3°F | Resp 16 | Wt 110.2 lb

## 2023-12-29 DIAGNOSIS — Z21 Asymptomatic human immunodeficiency virus [HIV] infection status: Secondary | ICD-10-CM

## 2023-12-29 DIAGNOSIS — N959 Unspecified menopausal and perimenopausal disorder: Secondary | ICD-10-CM

## 2023-12-29 DIAGNOSIS — E7800 Pure hypercholesterolemia, unspecified: Secondary | ICD-10-CM

## 2023-12-29 MED ORDER — CABOTEGRAVIR & RILPIVIRINE ER 600 & 900 MG/3ML IM SUER
1.0000 | Freq: Once | INTRAMUSCULAR | Status: AC
  Administered 2023-12-29: 1 via INTRAMUSCULAR

## 2023-12-29 NOTE — Progress Notes (Signed)
 Patient: Shirley White  DOB: 02/13/1971 MRN: [id number] PCP: Vicci Barnie NOVAK, MD   Reason for Visit: 2 month follow-up Cabenuva  injection and regular HIV care   Chief Complaint  Patient presents with   Follow-up    B20       Subjective   Subjective:   Shirley White is a 53 year old being seen in clinic for her 2 month follow up on Cabenuva . She states coming to the clinic faithfully every two months for injection. No barriers identified in accessing medication. She denies adverse effects to medication. Her current CD4 is 443 and HIV-1 VL remains undetectable. She denies fevers, chills, shortness of breath, and chest pain at this time. She she married to her husband and desires no STI testing at this time. She endorses stable housing and no food insecurity. She denies substance use at this time. She states she has goof family support. Preventive screenings up to date including mammogram and Pap smear. Immunizations reviewed and will plan for flu and shingrix vaccinations in November. She states she does not remember her last dental visit but is looking for one that is covered under her insurance. She states her last eye exam was last year. She expressed interest in HRT for menopause symptoms. She has no other concerns at this time.   Review of Systems  Constitutional:  Positive for diaphoresis.       Hot flashes, night sweats   All other systems reviewed and are negative.   Past Medical History:  Diagnosis Date   Anemia    Cancer of appendix (HCC) 2021   GERD (gastroesophageal reflux disease)    no meds   Heart murmur    HIV disease (HCC)     Outpatient Medications Prior to Visit  Medication Sig Dispense Refill   cabotegravir  & rilpivirine  ER (CABENUVA ) 600 & 900 MG/3ML injection Inject 1 kit into the muscle every 2 (two) months. 6 mL 5   fluconazole  (DIFLUCAN ) 100 MG tablet Take 1 tablet (100 mg total) by mouth daily. (Patient not taking: Reported on  12/29/2023) 7 tablet 0   naproxen  (NAPROSYN ) 250 MG tablet take 2 tablets (500 mg total) by mouth 2 (two) times daily with a meal (Patient not taking: Reported on 12/29/2023)     tranexamic acid  (LYSTEDA ) 650 MG TABS tablet Take 2 tablets (1,300 mg total) by mouth 3 (three) times daily. Take during menses for a maximum of five days (Patient not taking: Reported on 12/29/2023) 30 tablet 2   No facility-administered medications prior to visit.     No Known Allergies  Social History   Tobacco Use   Smoking status: Never   Smokeless tobacco: Never  Vaping Use   Vaping status: Never Used  Substance Use Topics   Alcohol use: Not Currently    Alcohol/week: 1.0 standard drink of alcohol    Types: 1 Cans of beer per week    Comment: occa   Drug use: Not Currently    Types: Cocaine, Marijuana    Family History  Problem Relation Age of Onset   Hyperlipidemia Mother    Hypertension Mother    Throat cancer Father    Prostate cancer Paternal Grandfather    Colon cancer Maternal Aunt    Leukemia Neg Hx    Lymphoma Neg Hx    Esophageal cancer Neg Hx    Stomach cancer Neg Hx    Rectal cancer Neg Hx        Objective  Objective:   Vitals:   12/29/23 1057 12/29/23 1058 12/29/23 1143  BP:  (!) 157/83 (!) 152/85  Pulse:  74   Resp: 16 16   Temp:  98.3 F (36.8 C)   TempSrc:  Oral   SpO2:  99%   Weight: 110 lb 3.2 oz (50 kg)     Body mass index is 20.82 kg/m.  Physical Exam Vitals reviewed.  Constitutional:      Appearance: Normal appearance.  Cardiovascular:     Rate and Rhythm: Normal rate and regular rhythm.  Pulmonary:     Effort: Pulmonary effort is normal.  Musculoskeletal:        General: Normal range of motion.  Neurological:     Mental Status: She is alert and oriented to person, place, and time.  Psychiatric:        Mood and Affect: Mood normal.        Behavior: Behavior normal.        Thought Content: Thought content normal.        Judgment: Judgment  normal.        Assessment & Plan:   Problem List Items Addressed This Visit       High   HIV (human immunodeficiency virus infection) (HCC) - Primary (Chronic)    Assessment and Plan  -HIV Management  Current regimen includes Cabenuva . Current CD4 443 and HIV-1 RNA is not detectable. Reinforced the importance of keeping appointments. She is remains compliant with dates. Continue with current regimen.  -Perimenopause Symptoms Patient stated she has been experiencing hot flashes and night sweats as she is going through menopause at this time. She expressed interest in HRT to offset symptoms. Currently we are enrolling HoT Study for women who are undergoing menopause. Will she if she is a candidate, and will be notified. If she does not qualify, will consider Activella, once daily combination pill. Will continue to monitor blood pressure.   -Health Maintenance  Last documented mammogram on 05/2023. Last documented pap smear on 08/2022. Immunizations reviewed. Will plan to get flu and shingrix vaccine in November. Will discuss Hep B and Meningococcal vaccination in January. Patient agreeable with plan.      No orders of the defined types were placed in this encounter.   Meds ordered this encounter  Medications   cabotegravir  & rilpivirine  ER (CABENUVA ) 600 & 900 MG/3ML injection 1 kit    No follow-ups on file.   Corean Fireman, MSN, NP-C [facility] for Infectious Disease [facility]  [email or web address] Pager: [phone] Office: [phone] [facility] Main Line: [phone] *Secure Chat Communication Welcome

## 2023-12-29 NOTE — Patient Instructions (Signed)
  Will see what the research team says about helping with hormone therapy for you -   Otherwise I would suggest a once daily pill called Activella   Will see what they say

## 2023-12-29 NOTE — Progress Notes (Signed)
 I was present and conducted my own interview and exam for the patient. I agree with [place], SNP with the following additions:    Discussed the use of AI scribe software for clinical note transcription with the patient, who gave verbal consent to proceed.  History of Present Illness   Shirley White is a 53 year old female with HIV who presents for follow-up on her menopause symptoms and vaccination schedule.  Menstrual bleeding has ceased for the past three months, leading to a significant increase in energy levels. She experiences severe hot flashes, particularly at night, which disrupt sleep and necessitate sleeping without clothing due to the heat. The hot flashes are described as frequent and bothersome, occurring even during work hours. She is currently taking cannabidiol as her only medication. There have been no recent bleeding episodes, and she is not on any other medications for her menopause symptoms.  Her last cholesterol level was slightly elevated at 218 mg/dL. She attributes this to her diet, which includes a lot of fast food due to her busy schedule with two jobs. She has gained weight and is currently 110 pounds, with a goal of reaching 120 pounds.  She plans to receive her flu shot at work and has discussed scheduling the Shingrix vaccine in November. She is also due for a Menveo booster and a hepatitis B vaccine, as she was found to be nonimmune ten months ago. She has not had shingles or chickenpox, but her husband has received the shingles vaccine.  She has a history of abnormal Pap smears, remotely. Her last Pap smear was in May 2024.      Exam:  Well appearing female seated comfortably in chair in no distress.    Assessment and Plan    HIV infection - Well controlled on q2m Cabenuva  - TD 13 Odd months.  - Monitor viral load at next visit.  Menopausal symptoms - Experiencing significant menopausal symptoms, including hot flashes and night sweats, affecting daily  life and sleep. I think she would be a wonderful candidate for HoT study looking at Menopausal Hormone Therapy for Women Living With HIV. I will send her chart for review to our research team. If not a candidate, would see if we can start once daily Activella management with combination estradiol/progestin product. Could consider estradiol patches and transvaginal progestin as well if she prefer to avoid all pills.  Will need to follow BP on estradiol.  - If not eligible for study, consider hormone replacement therapy options.  Hypercholesterolemia - Cholesterol level slightly elevated at 218 mg/dL. Reports a diet high in fast food due to work schedule. Discussed dietary modifications and the importance of fasting before cholesterol tests. - Recheck fasting cholesterol in January.  Abnormal Pap smear history -  Last Pap smear in May 2024 was normal. Plan to perform another Pap smear in January to ensure three consecutive normal results before extending the interval between screenings. - Perform Pap smear in January.  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 Discussed the potential benefit of the newer hepatitis B vaccine, which may elicit a better immune response. - Consider hepatitis B vaccination January  Immunizations: influenza, herpes zoster, and meningococcal disease Plans to receive the influenza and herpes zoster vaccines in November.  Discussed the side effects of the Shingrix vaccine, particularly the second dose, which may cause chills, fatigue, and achy joints. Meningococcal booster is due but not urgent. - Administer influenza and herpes zoster vaccines in November. -  Administer meningococcal booster next year.   Corean Fireman, MSN, NP-C [facility] for Infectious Disease [facility]  [email or web address] Pager: [phone] Office: [phone] [facility] Main Line: [phone] *Secure Chat Communication Welcome

## 2024-02-01 ENCOUNTER — Encounter: Payer: Self-pay | Admitting: Oncology

## 2024-02-10 ENCOUNTER — Telehealth: Payer: Self-pay

## 2024-02-10 NOTE — Telephone Encounter (Signed)
 [facility] Patient Advocate Encounter  Patient's medications CABENUVA  have been couriered to [facility] from [facility] and will be administered at the patients appointment on 02/16/24.  Charmaine Sharps, CPhT Specialty Pharmacy Patient [facility] for Infectious Disease Phone: [phone] Fax:  [phone]

## 2024-02-15 NOTE — Progress Notes (Unsigned)
 HPI: Shirley White is a 53 y.o. female who presents to the [facility] pharmacy clinic for Cabenuva  administration.  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   FLUCONAZOLE  (DIFLUCAN ) 100 MG TABLET    Take 1 tablet (100 mg total) by mouth daily.   NAPROXEN  (NAPROSYN ) 250 MG TABLET    take 2 tablets (500 mg total) by mouth 2 (two) times daily with a meal   TRANEXAMIC ACID  (LYSTEDA ) 650 MG TABS TABLET    Take 2 tablets (1,300 mg total) by mouth 3 (three) times daily. Take during menses for a maximum of five days  Modified Medications   No medications on file  Discontinued Medications   No medications on file    Allergies: No Known Allergies  Past Medical History: Past Medical History:  Diagnosis Date   Anemia    Cancer of appendix (HCC) 2021   GERD (gastroesophageal reflux disease)    no meds   Heart murmur    HIV disease (HCC)     Social History: Social History   Socioeconomic History   Marital status: Single    Spouse name: Not on file   Number of children: 4   Years of education: Not on file   Highest education level: 12th grade  Occupational History   Occupation: conservation officer, [employer] at [employer]  Tobacco Use   Smoking status: Never   Smokeless tobacco: Never  Vaping Use   Vaping  status: Never Used  Substance and Sexual Activity   Alcohol use: Not Currently    Alcohol/week: 1.0 standard drink of alcohol    Types: 1 Cans of beer per week    Comment: occa   Drug use: Not Currently    Types: Cocaine, Marijuana   Sexual activity: Not Currently    Partners: Male    Birth control/protection: Condom    Comment: DECLINED CONDOMS  Other Topics Concern   Not on file  Social History Narrative   Patient lives alone   Patient has to take public transportation   Social Drivers of Health   Financial Resource Strain: Not on file  Food Insecurity: No Food Insecurity (04/17/2023)   Hunger Vital Sign    Worried About Running Out of Food in the Last Year: Never true    Ran Out of Food in the Last Year: Never true  Transportation Needs: No Transportation Needs (04/17/2023)   PRAPARE - Administrator, Civil Service (Medical): No    Lack of Transportation (Non-Medical): No  Physical Activity: Not on file  Stress: Not on file  Social Connections: Not on file    Labs: Lab Results  Component Value Date   HIV1RNAQUANT NOT DETECTED 10/20/2023   HIV1RNAQUANT Not Detected 06/19/2023   HIV1RNAQUANT Not Detected 02/24/2023   CD4TABS 446  02/24/2023   CD4TABS 554 04/29/2022   CD4TABS 462 12/20/2020    RPR and STI Lab Results  Component Value Date   LABRPR NON-REACTIVE 06/19/2023   LABRPR NON-REACTIVE 12/20/2020   LABRPR NON-REACTIVE 06/22/2018   LABRPR NON REAC 08/21/2015   LABRPR NON REAC 09/05/2014    STI Results GC CT  04/29/2018 12:00 AM Negative  Negative   03/22/2018 12:00 AM Negative  Negative   08/21/2015 12:00 AM Negative  Negative   09/05/2014 12:00 AM Negative  Negative     Hepatitis B Lab Results  Component Value Date   HEPBSAB NON-REACTIVE 02/24/2023   HEPBSAG NEGATIVE 09/05/2014   HEPBCAB NON REACTIVE 09/05/2014   Hepatitis C No results found for: HEPCAB, HCVRNAPCRQN Hepatitis A Lab Results  Component Value Date   HAV REACTIVE  (A) 09/05/2014   Lipids: Lab Results  Component Value Date   CHOL 218 (H) 06/19/2023   TRIG 61 06/19/2023   HDL 87 06/19/2023   CHOLHDL 2.5 06/19/2023   VLDL 12 08/21/2015   LDLCALC 116 (H) 06/19/2023    TARGET DATE:  The 11th of the month  Assessment: Shirley White presents today for their maintenance Cabenuva  injections. Initial/past injections were tolerated well without issues. No problems with systemic effects of injections.   Administered cabotegravir  600mg /80mL in left upper outer quadrant of the gluteal muscle. Administered rilpivirine  900 mg/3mL in the right upper outer quadrant of the gluteal muscle. Monitored patient for 10 minutes after injection. Injections were tolerated well without issue. Patient will follow up in 2 months for next injection. Will defer HIV RNA testing until next follow-up visit.  Eligible for HBV, Menveo, flu, COVID, and Shingles vaccines today; accepts her annual flu vaccine.   Plan: - Administer Cabenuva  injections - Administer flu vaccine  - Next injections scheduled for 04/22/24 with Corean and 3/10 with me  - Call with any issues or questions  Alan Geralds, PharmD, CPP, BCIDP, AAHIVP Clinical Pharmacist Practitioner Infectious Diseases Clinical Pharmacist [facility]

## 2024-02-16 ENCOUNTER — Ambulatory Visit: Payer: Self-pay | Admitting: Pharmacist

## 2024-02-16 ENCOUNTER — Other Ambulatory Visit: Payer: Self-pay

## 2024-02-16 ENCOUNTER — Encounter: Payer: Self-pay | Admitting: Oncology

## 2024-02-16 DIAGNOSIS — Z23 Encounter for immunization: Secondary | ICD-10-CM

## 2024-02-16 DIAGNOSIS — Z21 Asymptomatic human immunodeficiency virus [HIV] infection status: Secondary | ICD-10-CM | POA: Diagnosis not present

## 2024-02-16 MED ORDER — CABOTEGRAVIR & RILPIVIRINE ER 600 & 900 MG/3ML IM SUER
1.0000 | Freq: Once | INTRAMUSCULAR | Status: AC
  Administered 2024-02-16: 1 via INTRAMUSCULAR

## 2024-02-16 NOTE — Patient Instructions (Signed)
 Deanna's email: [email or web address] ^ send both [employer] and [employer] pay stubs here  Call us  [phone] if questions.

## 2024-02-27 ENCOUNTER — Other Ambulatory Visit: Payer: Self-pay

## 2024-02-27 ENCOUNTER — Ambulatory Visit (INDEPENDENT_AMBULATORY_CARE_PROVIDER_SITE_OTHER)

## 2024-02-27 ENCOUNTER — Ambulatory Visit
Admission: EM | Admit: 2024-02-27 | Discharge: 2024-02-27 | Disposition: A | Discharge status: Home / Self Care | Attending: Internal Medicine | Admitting: Internal Medicine

## 2024-02-27 DIAGNOSIS — M542 Cervicalgia: Secondary | ICD-10-CM | POA: Diagnosis not present

## 2024-02-27 DIAGNOSIS — S46812A Strain of other muscles, fascia and tendons at shoulder and upper arm level, left arm, initial encounter: Secondary | ICD-10-CM

## 2024-02-27 MED ORDER — TIZANIDINE HCL 4 MG PO TABS: 4.0000 mg | ORAL_TABLET | Freq: Four times a day (QID) | ORAL | 0 refills | Status: AC | PRN

## 2024-02-27 MED ORDER — IBUPROFEN 600 MG PO TABS: 600.0000 mg | ORAL_TABLET | Freq: Four times a day (QID) | ORAL | 0 refills | Status: AC | PRN

## 2024-02-27 NOTE — ED Triage Notes (Signed)
 Pt c/o pain in left neck that radiates to left shoulderx2d. Pt denies injury. Pt c/o numbness and tingling in left side of neck and states she can hardly move her neck.

## 2024-02-27 NOTE — Discharge Instructions (Addendum)
 X-ray of the neck done today.  Final evaluation by the radiologist does not show any acute findings.  Symptoms and physical exam findings are most consistent with a strain of the left trapezius muscle.  We can treat this with a muscle relaxer as well as anti-inflammatories.  We will treat with the following: Tizanidine  (Zanaflex ) 4 mg every 8 hours as needed for muscle spasms.  Use caution as this medication can cause drowsiness.  Ibuprofen  600 mg every 6 hours as needed for pain Light stretching to improve mobility May use moist heat on the area to help improve symptoms If you are not having any improvement in symptoms in the next 3 to 4 days then may need to consider following up with an orthopedist Can follow-up at urgent care as needed

## 2024-02-27 NOTE — ED Provider Notes (Addendum)
 [facility]    CSN: [id number] Arrival date & time: 02/27/24  1222      History   Chief Complaint No chief complaint on file.   HPI Shirley White is a 53 y.o. female.   53 year old female who presents urgent care with complaints of left neck pain and decreased range of motion.  She reports that this just started when she woke up 2 days ago.  She reports that the pain extends into her shoulder and upper back.  She denies any specific injury to the area.  She has not tried any medication for it.  She denies any previous injury to the area but does think she has been seen for some similar symptoms in the past.     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   Adenocarcinoma, colon (HCC) 07/20/2019   HIV (human immunodeficiency virus infection) (HCC) 07/14/2018   LGSIL on Pap smear of cervix 04/29/2018   H/O menorrhagia 09/05/2014   Iron  deficiency anemia 09/05/2014   Sickle-cell trait 06/04/2006    Past Surgical History:  Procedure Laterality Date   BREAST BIOPSY Right 04/2018   BREAST BIOPSY Right 06/30/2023   US  RT BREAST BX W LOC DEV 1ST LESION IMG BX SPEC US  GUIDE 06/30/2023 [facility]   BREAST BIOPSY Left 06/30/2023   US  LT BREAST BX W LOC DEV 1ST LESION IMG BX SPEC US  GUIDE 06/30/2023 [facility]   BREAST BIOPSY Left 06/30/2023   US  LT BREAST BX W LOC DEV EA ADD LESION IMG BX SPEC US  GUIDE 06/30/2023 [facility]   BREAST EXCISIONAL BIOPSY Right 06/2018   CESAREAN SECTION     ESOPHAGOGASTRODUODENOSCOPY N/A 02/03/2016   Procedure: ESOPHAGOGASTRODUODENOSCOPY (EGD);   Surgeon: Elspeth Deward Naval, MD;  Location: [facility] ENDOSCOPY;  Service: Gastroenterology;  Laterality: N/A;   LAPAROSCOPIC APPENDECTOMY N/A 07/14/2019   Procedure: LAPAROSCOPIC APPENDECTOMY CONVERTED TO OPEN RIGHT HEMICOLECTOMY;  Surgeon: Vernetta Berg, MD;  Location: [facility] OR;  Service: General;  Laterality: N/A;   RADIOACTIVE SEED GUIDED EXCISIONAL BREAST BIOPSY Right 06/23/2018   Procedure: RADIOACTIVE SEED GUIDED EXCISIONAL RIGHT BREAST BIOPSY;  Surgeon: Aron Shoulders, MD;  Location:  SURGERY CENTER;  Service: General;  Laterality: Right;   RIGHT COLECTOMY  07/14/2019   at time of appendectomy    OB History     Gravida  5   Para  4   Term  4   Preterm      AB  1   Living  4      SAB  0   IAB  1   Ectopic      Multiple      Live Births  4            Home Medications    Prior to Admission medications   Medication Sig Start Date End Date Taking? Authorizing Provider  cabotegravir  & rilpivirine  ER (CABENUVA ) 600 & 900 MG/3ML injection Inject 1 kit into the muscle every 2 (two) months. 09/29/23   Waddell Alan PARAS, RPH-CPP  fluconazole  (DIFLUCAN ) 100 MG tablet Take  1 tablet (100 mg total) by mouth daily. Patient not taking: Reported on 12/29/2023 01/06/23   Melvenia Corean SAILOR, NP  naproxen  (NAPROSYN ) 250 MG tablet take 2 tablets (500 mg total) by mouth 2 (two) times daily with a meal Patient not taking: Reported on 12/29/2023    [provider]  tranexamic acid  (LYSTEDA ) 650 MG TABS tablet Take 2 tablets (1,300 mg total) by mouth 3 (three) times daily. Take during menses for a maximum of five days Patient not taking: Reported on 12/29/2023 04/17/23   Herchel Gloris LABOR, MD    Family History Family History  Problem Relation Age of Onset   Hyperlipidemia Mother    Hypertension Mother    Throat cancer Father    Prostate cancer Paternal Grandfather    Colon cancer Maternal Aunt    Leukemia Neg Hx    Lymphoma Neg Hx    Esophageal cancer Neg Hx     Stomach cancer Neg Hx    Rectal cancer Neg Hx     Social History Social History   Tobacco Use   Smoking status: Never   Smokeless tobacco: Never  Vaping Use   Vaping status: Never Used  Substance Use Topics   Alcohol use: Not Currently    Alcohol/week: 1.0 standard drink of alcohol    Types: 1 Cans of beer per week    Comment: occa   Drug use: Not Currently    Types: Cocaine, Marijuana     Allergies   Patient has no known allergies.   Review of Systems Review of Systems  Constitutional:  Negative for chills and fever.  HENT:  Negative for ear pain and sore throat.   Eyes:  Negative for pain and visual disturbance.  Respiratory:  Negative for cough and shortness of breath.   Cardiovascular:  Negative for chest pain and palpitations.  Gastrointestinal:  Negative for abdominal pain and vomiting.  Genitourinary:  Negative for dysuria and hematuria.  Musculoskeletal:  Positive for neck pain. Negative for arthralgias and back pain.  Skin:  Negative for color change and rash.  Neurological:  Negative for seizures and syncope.  All other systems reviewed and are negative.    Physical Exam Triage Vital Signs ED Triage Vitals  Encounter Vitals Group     BP 02/27/24 1247 136/84     Girls Systolic BP Percentile --      Girls Diastolic BP Percentile --      Boys Systolic BP Percentile --      Boys Diastolic BP Percentile --      Pulse Rate 02/27/24 1247 78     Resp 02/27/24 1247 16     Temp 02/27/24 1247 98 F (36.7 C)     Temp Source 02/27/24 1247 Oral     SpO2 02/27/24 1247 94 %     Weight --      Height --      Head Circumference --      Peak Flow --      Pain Score 02/27/24 1246 10     Pain Loc --      Pain Education --      Exclude from Growth Chart --    No data found.  Updated Vital Signs BP 136/84   Pulse 78   Temp 98 F (36.7 C) (Oral)   Resp 16   LMP 01/28/2023 (Exact Date)   SpO2 94%   Visual Acuity Right Eye Distance:   Left Eye Distance:    Bilateral Distance:  Right Eye Near:   Left Eye Near:    Bilateral Near:     Physical Exam Vitals and nursing note reviewed.  Constitutional:      General: She is not in acute distress.    Appearance: She is well-developed.  HENT:     Head: Normocephalic and atraumatic.  Eyes:     Conjunctiva/sclera: Conjunctivae normal.  Cardiovascular:     Rate and Rhythm: Normal rate and regular rhythm.     Heart sounds: Murmur heard.  Pulmonary:     Effort: Pulmonary effort is normal. No respiratory distress.     Breath sounds: Normal breath sounds.  Abdominal:     Palpations: Abdomen is soft.     Tenderness: There is no abdominal tenderness.  Musculoskeletal:        General: No swelling.     Cervical back: Neck supple.       Back:  Skin:    General: Skin is warm and dry.     Capillary Refill: Capillary refill takes less than 2 seconds.  Neurological:     Mental Status: She is alert.  Psychiatric:        Mood and Affect: Mood normal.      UC Treatments / Results  Labs (all labs ordered are listed, but only abnormal results are displayed) Labs Reviewed - No data to display  EKG   Radiology DG Cervical Spine Complete Result Date: 02/27/2024 EXAM: 6 OR MORE VIEW(S) XRAY OF THE CERVICAL SPINE 02/27/2024 01:14:09 PM COMPARISON: None available. CLINICAL HISTORY: neck pain especially on the left, has had in past, no injury FINDINGS: BONES: No acute fracture. No aggressive appearing osseous lesion. Alignment is normal. DISCS AND DEGENERATIVE CHANGES: Exuberant anterior endplate spurring about interspaces C3-C6. Disc heights relatively well maintained throughout. SOFT TISSUES: No prevertebral soft tissue swelling. The visualized lungs appear clear. IMPRESSION: 1. No acute abnormality of the cervical spine. 2. Exuberant anterior endplate spurring at C3C6, with preserved disc heights. Electronically signed by: Dayne Hassell MD 02/27/2024 01:16 PM EST RP Workstation: [id number]     Procedures Procedures (including critical care time)  Medications Ordered in UC Medications - No data to display  Initial Impression / Assessment and Plan / UC Course  I have reviewed the triage vital signs and the nursing notes.  Pertinent labs & imaging results that were available during my care of the patient were reviewed by me and considered in my medical decision making (see chart for details).     Neck pain - Plan: DG Cervical Spine Complete, DG Cervical Spine Complete  Trapezius strain, left, initial encounter   X-ray of the neck done today.  Final evaluation by the radiologist does not show any acute findings.  Symptoms and physical exam findings are most consistent with a strain of the left trapezius muscle.  We can treat this with a muscle relaxer as well as anti-inflammatories.  We will treat with the following: Tizanidine  (Zanaflex ) 4 mg every 8 hours as needed for muscle spasms.  Use caution as this medication can cause drowsiness.  Ibuprofen  600 mg every 6 hours as needed for pain Light stretching to improve mobility May use moist heat on the area to help improve symptoms If you are not having any improvement in symptoms in the next 3 to 4 days then may need to consider following up with an orthopedist Can follow-up at urgent care as needed  Final Clinical Impressions(s) / UC Diagnoses   Final diagnoses:  Neck pain  Trapezius  strain, left, initial encounter     Discharge Instructions      X-ray of the neck done today.  Final evaluation by the radiologist does not show any acute findings.  Symptoms and physical exam findings are most consistent with a strain of the left trapezius muscle.  We can treat this with a muscle relaxer as well as anti-inflammatories.  We will treat with the following: Tizanidine  (Zanaflex ) 4 mg every 8 hours as needed for muscle spasms.  Use caution as this medication can cause drowsiness.  Ibuprofen  600 mg every 6 hours as needed  for pain Light stretching to improve mobility May use moist heat on the area to help improve symptoms If you are not having any improvement in symptoms in the next 3 to 4 days then may need to consider following up with an orthopedist Can follow-up at urgent care as needed     ED Prescriptions   None    PDMP not reviewed this encounter.   Teresa Almarie LABOR, PA-C 02/27/24 1321    Teresa Almarie LABOR, PA-C 02/27/24 1336

## 2024-03-29 ENCOUNTER — Telehealth: Payer: Self-pay

## 2024-03-29 NOTE — Telephone Encounter (Signed)
 [facility] Patient Advocate Encounter  Patient's medications CABENUVA  have been couriered to [facility] from [employer] and will be administered at the patients appointment on 04/22/24.  Charmaine Sharps, CPhT Specialty Pharmacy Patient [facility] for Infectious Disease Phone: [phone] Fax:  [phone]

## 2024-04-22 ENCOUNTER — Other Ambulatory Visit: Payer: Self-pay

## 2024-04-22 ENCOUNTER — Other Ambulatory Visit (HOSPITAL_COMMUNITY): Payer: Self-pay

## 2024-04-22 ENCOUNTER — Encounter: Payer: Self-pay | Admitting: Oncology

## 2024-04-22 ENCOUNTER — Ambulatory Visit: Payer: Self-pay | Admitting: Infectious Diseases

## 2024-04-22 VITALS — BP 146/83 | HR 69 | Temp 98.0°F | Ht 61.0 in | Wt 114.2 lb

## 2024-04-22 DIAGNOSIS — D500 Iron deficiency anemia secondary to blood loss (chronic): Secondary | ICD-10-CM

## 2024-04-22 DIAGNOSIS — B20 Human immunodeficiency virus [HIV] disease: Secondary | ICD-10-CM

## 2024-04-22 MED ORDER — CABOTEGRAVIR & RILPIVIRINE ER 600 & 900 MG/3ML IM SUER
1.0000 | Freq: Once | INTRAMUSCULAR | Status: AC
  Administered 2024-04-22: 1 via INTRAMUSCULAR

## 2024-04-22 NOTE — Progress Notes (Addendum)
 "  Name: Shirley White  DOB: 05/06/1970 MRN: [id number] PCP: Vicci Barnie NOVAK, MD    Brief Narrative:  Shirley White is a 54 y.o. woman with HIV disease originally diagnosed in 2005. Poor adherence with AIDS qualifying CD4s since 2016 with CD4 nadir 30.  HIV Risk: heterosexual.  History of OIs: esophageal candidiasis.   Previous Regimens: Complera 2011 >> difficulty swallowing regimen Emtriva  + Edurant + Viread 2011 through 01-2014 Odefsey  2016 Tiviay + Truvada 2018  Biktarvy  2019 Cabenuva  07-2021  Genotypes: 08-2014 - sensitive 11-2016 - sensitive    Subjective:   Chief Complaint  Patient presents with   Follow-up    CAB TD 13 / PAP smear      Discussed the use of AI scribe software for clinical note transcription with the patient, who gave verbal consent to proceed.  History of Present Illness   Shirley White is a 54 year old female with HIV who presents for routine follow-up care and Cabenuva  administration.  She is scheduled for her Cabenuva  administration, with her target date being the 13th of the odd months. Her last administration was on November 4th. She has been on Cabenuva  for almost three years, with April marking the third year. She has not missed a dose, emphasizing the importance of maintaining her schedule to avoid restarting the process.  Her viral load was undetectable at her last blood work six months ago. Her last complete blood count in March showed a hemoglobin level of 10.5, and she notes that her bleeding has improved since then.  She recently visited urgent care in November for neck pain evaluation, but no further details about the outcome or treatment are provided.  She is due for lab work today.  In her social history, she discusses her responsibilities in caring for her mother, who recently underwent surgery for gallbladder stones. She describes a demanding schedule, balancing work and caregiving duties, which impacts her daily  routine and sleep. Her brother's limited involvement in caregiving duties adds to her responsibilities.       Review of Systems  Constitutional:  Negative for fatigue.  Genitourinary:  Negative for menstrual problem.  All other systems reviewed and are negative.    Past Medical History:  Diagnosis Date   Anemia    Cancer of appendix (HCC) 2021   GERD (gastroesophageal reflux disease)    no meds   Heart murmur    HIV disease (HCC)     Outpatient Medications Prior to Visit  Medication Sig Dispense Refill   cabotegravir  & rilpivirine  ER (CABENUVA ) 600 & 900 MG/3ML injection Inject 1 kit into the muscle every 2 (two) months. 6 mL 5   ibuprofen  (ADVIL ) 600 MG tablet Take 1 tablet (600 mg total) by mouth every 6 (six) hours as needed. 30 tablet 0   fluconazole  (DIFLUCAN ) 100 MG tablet Take 1 tablet (100 mg total) by mouth daily. (Patient not taking: Reported on 04/22/2024) 7 tablet 0   tiZANidine  (ZANAFLEX ) 4 MG tablet Take 1 tablet (4 mg total) by mouth every 6 (six) hours as needed for muscle spasms. (Patient not taking: Reported on 04/22/2024) 30 tablet 0   tranexamic acid  (LYSTEDA ) 650 MG TABS tablet Take 2 tablets (1,300 mg total) by mouth 3 (three) times daily. Take during menses for a maximum of five days (Patient not taking: Reported on 04/22/2024) 30 tablet 2   No facility-administered medications prior to visit.     No Known Allergies  Social History  Tobacco Use   Smoking status: Never   Smokeless tobacco: Never  Vaping Use   Vaping status: Never Used  Substance Use Topics   Alcohol use: Not Currently    Alcohol/week: 1.0 standard drink of alcohol    Types: 1 Cans of beer per week    Comment: occa   Drug use: Not Currently    Types: Cocaine, Marijuana    Social History   Substance and Sexual Activity  Sexual Activity Not Currently   Partners: Male   Birth control/protection: Condom   Comment: DECLINED CONDOMS     Objective:   Vitals:   04/22/24 0936   BP: (!) 146/83  Pulse: 69  Temp: 98 F (36.7 C)  TempSrc: Oral  SpO2: 99%  Weight: 114 lb 3.2 oz (51.8 kg)  Height: 5' 1 (1.549 m)     Body mass index is 21.58 kg/m.   Physical Exam HENT:     Mouth/Throat:     Mouth: Mucous membranes are moist.     Pharynx: Oropharynx is clear.  Eyes:     Pupils: Pupils are equal, round, and reactive to light.  Cardiovascular:     Rate and Rhythm: Normal rate and regular rhythm.  Abdominal:     General: There is no distension.     Tenderness: There is no abdominal tenderness.  Skin:    General: Skin is warm and dry.     Capillary Refill: Capillary refill takes less than 2 seconds.     Coloration: Skin is not pale.  Neurological:     Mental Status: She is oriented to person, place, and time.      Lab Results Lab Results  Component Value Date   WBC 4.9 06/19/2023   HGB 10.5 (L) 06/19/2023   HCT 34.0 (L) 06/19/2023   MCV 73.1 (L) 06/19/2023   PLT 402 (H) 06/19/2023    Lab Results  Component Value Date   CREATININE 0.54 06/19/2023   BUN 13 06/19/2023   NA 141 06/19/2023   K 4.2 06/19/2023   CL 105 06/19/2023   CO2 26 06/19/2023    Lab Results  Component Value Date   ALT 9 06/19/2023   AST 13 06/19/2023   ALKPHOS 39 06/17/2022   BILITOT 0.8 06/19/2023    Lab Results  Component Value Date   CHOL 218 (H) 06/19/2023   HDL 87 06/19/2023   LDLCALC 116 (H) 06/19/2023   TRIG 61 06/19/2023   CHOLHDL 2.5 06/19/2023   HIV 1 RNA Quant  Date Value  10/20/2023 NOT DETECTED copies/mL  06/19/2023 Not Detected Copies/mL  02/24/2023 Not Detected Copies/mL   CD4 T Cell Abs (/uL)  Date Value  02/24/2023 446  04/29/2022 554  12/20/2020 462     Assessment & Plan:     Human immunodeficiency virus (HIV) disease - HIV is well-controlled with Cabenuva  injections. Last administration was on November 4th, 2024. Viral load remains undetectable. Her CD4 has been > 500 consistently indicating a good immunologic recovery. She  adheres to the injection schedule without missing doses. - Administered Cabenuva  injection today - Ordered lab work including viral load and CD4 - pap smear to update next visit to follow up.   Iron  deficiency anemia due to chronic blood loss - Iron  deficiency anemia is due to chronic blood loss in the setting of uterine bleeding a/w fibroids. Previous CBC showed hypochromic, microcytic anemia with elevated RDW, indicating iron  deficiency. Ferritin was 1. Bleeding has improved as she moves through peri- and  post menopause. This will hopefully aid in anemia correction. Oral iron  supplements are not well tolerated, and she has previously received iron  infusions. Not following with heme at the moment.  - Ordered CBC to assess current anemia status - Checked iron  levels - Will consider iron  infusions if oral iron  is not tolerated      Meds ordered this encounter  Medications   cabotegravir  & rilpivirine  ER (CABENUVA ) 600 & 900 MG/3ML injection 1 kit   Orders Placed This Encounter  Procedures   HIV 1 RNA quant-no reflex-bld   T-helper cells (CD4) count   COMPLETE METABOLIC PANEL WITHOUT GFR   CBC w/Diff   Iron , TIBC and Ferritin Panel   06/21/2024 next cabenuva  appt.    Corean Fireman, MSN, NP-C [facility]  [email or web address] Pager: [phone] Office: [phone] [facility] Main Line: [phone] *Secure Chat Communication Welcome   This evaluation (history, exam, medical decision making) was significant and separately identifiable from the injection administration in the office today.   Visit involved longitudinal management of chronic HIV infection, including review of viral suppression, immune status, medication adherence, long-term complications, and coordination of preventive care and surveillance.    "

## 2024-04-26 LAB — CBC WITH DIFFERENTIAL/PLATELET
Absolute Lymphocytes: 1375 {cells}/uL (ref 850–3900)
Absolute Monocytes: 645 {cells}/uL (ref 200–950)
Basophils Absolute: 90 {cells}/uL (ref 0–200)
Basophils Relative: 1.8 %
Eosinophils Absolute: 210 {cells}/uL (ref 15–500)
Eosinophils Relative: 4.2 %
HCT: 37.9 % (ref 35.9–46.0)
Hemoglobin: 11.9 g/dL (ref 11.7–15.5)
MCH: 26.4 pg — ABNORMAL LOW (ref 27.0–33.0)
MCHC: 31.4 g/dL — ABNORMAL LOW (ref 31.6–35.4)
MCV: 84.2 fL (ref 81.4–101.7)
MPV: 11 fL (ref 7.5–12.5)
Monocytes Relative: 12.9 %
Neutro Abs: 2680 {cells}/uL (ref 1500–7800)
Neutrophils Relative %: 53.6 %
Platelets: 312 Thousand/uL (ref 140–400)
RBC: 4.5 Million/uL (ref 3.80–5.10)
RDW: 16.2 % — ABNORMAL HIGH (ref 11.0–15.0)
Total Lymphocyte: 27.5 %
WBC: 5 Thousand/uL (ref 3.8–10.8)

## 2024-04-26 LAB — COMPLETE METABOLIC PANEL WITHOUT GFR
AG Ratio: 1.3 (calc) (ref 1.0–2.5)
ALT: 9 U/L (ref 6–29)
AST: 13 U/L (ref 10–35)
Albumin: 3.9 g/dL (ref 3.6–5.1)
Alkaline phosphatase (APISO): 64 U/L (ref 37–153)
BUN/Creatinine Ratio: 19 (calc) (ref 6–22)
BUN: 8 mg/dL (ref 7–25)
CO2: 26 mmol/L (ref 20–32)
Calcium: 9 mg/dL (ref 8.6–10.4)
Chloride: 105 mmol/L (ref 98–110)
Creat: 0.42 mg/dL — ABNORMAL LOW (ref 0.50–1.03)
Globulin: 3.1 g/dL (ref 1.9–3.7)
Glucose, Bld: 84 mg/dL (ref 65–99)
Potassium: 4.2 mmol/L (ref 3.5–5.3)
Sodium: 136 mmol/L (ref 135–146)
Total Bilirubin: 1 mg/dL (ref 0.2–1.2)
Total Protein: 7 g/dL (ref 6.1–8.1)

## 2024-04-26 LAB — T-HELPER CELLS (CD4) COUNT (NOT AT ARMC)
Absolute CD4: 410 {cells}/uL — ABNORMAL LOW (ref 490–1740)
CD4 T Helper %: 30 % (ref 30–61)
Total lymphocyte count: 1384 {cells}/uL (ref 850–3900)

## 2024-04-26 LAB — HIV-1 RNA QUANT-NO REFLEX-BLD
HIV 1 RNA Quant: NOT DETECTED {copies}/mL
HIV-1 RNA Quant, Log: NOT DETECTED {Log_copies}/mL

## 2024-04-26 LAB — IRON,TIBC AND FERRITIN PANEL
%SAT: 15 % — ABNORMAL LOW (ref 16–45)
Ferritin: 5 ng/mL — ABNORMAL LOW (ref 16–232)
Iron: 59 ug/dL (ref 45–160)
TIBC: 397 ug/dL (ref 250–450)

## 2024-04-29 ENCOUNTER — Ambulatory Visit: Payer: Self-pay | Admitting: Infectious Diseases

## 2024-05-05 NOTE — Telephone Encounter (Signed)
 Patient has not read MyChart message. Called [facility] and read Stephanie's message to her. Provided her with phone number to Dr. Andriette office to coordinate iron  infusion. Patient verbalized understanding and has no further questions.   Koby Hartfield, BSN, RN

## 2024-06-21 ENCOUNTER — Ambulatory Visit: Admitting: Pharmacist
